# Patient Record
Sex: Female | Born: 1955
Health system: Southern US, Community
[De-identification: ages and names within clinical notes are randomized; demographics above are authoritative.]

## PROBLEM LIST (undated history)

## (undated) DIAGNOSIS — I10 Essential (primary) hypertension: Secondary | ICD-10-CM

## (undated) DIAGNOSIS — K648 Other hemorrhoids: Secondary | ICD-10-CM

## (undated) DIAGNOSIS — H409 Unspecified glaucoma: Secondary | ICD-10-CM

## (undated) DIAGNOSIS — E0590 Thyrotoxicosis, unspecified without thyrotoxic crisis or storm: Secondary | ICD-10-CM

## (undated) DIAGNOSIS — T4145XA Adverse effect of unspecified anesthetic, initial encounter: Secondary | ICD-10-CM

## (undated) DIAGNOSIS — E7800 Pure hypercholesterolemia, unspecified: Secondary | ICD-10-CM

## (undated) DIAGNOSIS — E039 Hypothyroidism, unspecified: Secondary | ICD-10-CM

## (undated) DIAGNOSIS — E11329 Type 2 diabetes mellitus with mild nonproliferative diabetic retinopathy without macular edema: Secondary | ICD-10-CM

## (undated) DIAGNOSIS — IMO0002 Reserved for concepts with insufficient information to code with codable children: Secondary | ICD-10-CM

## (undated) DIAGNOSIS — D72819 Decreased white blood cell count, unspecified: Secondary | ICD-10-CM

## (undated) DIAGNOSIS — K5909 Other constipation: Secondary | ICD-10-CM

## (undated) DIAGNOSIS — E109 Type 1 diabetes mellitus without complications: Secondary | ICD-10-CM

## (undated) DIAGNOSIS — T8859XA Other complications of anesthesia, initial encounter: Secondary | ICD-10-CM

## (undated) DIAGNOSIS — I8390 Asymptomatic varicose veins of unspecified lower extremity: Secondary | ICD-10-CM

## (undated) HISTORY — DX: Type 1 diabetes mellitus without complications: E10.9

## (undated) HISTORY — DX: Hypothyroidism, unspecified: E03.9

## (undated) HISTORY — PX: TUBAL LIGATION: SHX77

## (undated) HISTORY — DX: Unspecified glaucoma: H40.9

## (undated) HISTORY — DX: Thyrotoxicosis, unspecified without thyrotoxic crisis or storm: E05.90

## (undated) HISTORY — DX: Other constipation: K59.09

## (undated) HISTORY — DX: Pure hypercholesterolemia, unspecified: E78.00

## (undated) HISTORY — DX: Decreased white blood cell count, unspecified: D72.819

## (undated) HISTORY — DX: Essential (primary) hypertension: I10

## (undated) HISTORY — DX: Asymptomatic varicose veins of unspecified lower extremity: I83.90

## (undated) HISTORY — DX: Type 2 diabetes mellitus with mild nonproliferative diabetic retinopathy without macular edema: E11.329

## (undated) HISTORY — DX: Other hemorrhoids: K64.8

## (undated) HISTORY — PX: EYE SURGERY: SHX253

## (undated) HISTORY — DX: Reserved for concepts with insufficient information to code with codable children: IMO0002

---
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
## 1997-10-10 ENCOUNTER — Other Ambulatory Visit: Admission: RE | Admit: 1997-10-10 | Discharge: 1997-10-10 | Payer: Self-pay | Admitting: Internal Medicine

## 1998-03-27 ENCOUNTER — Ambulatory Visit (HOSPITAL_COMMUNITY): Admission: RE | Admit: 1998-03-27 | Discharge: 1998-03-27 | Payer: Self-pay | Admitting: Obstetrics & Gynecology

## 1999-03-30 ENCOUNTER — Ambulatory Visit (HOSPITAL_COMMUNITY): Admission: RE | Admit: 1999-03-30 | Discharge: 1999-03-30 | Payer: Self-pay | Admitting: Obstetrics & Gynecology

## 1999-06-01 ENCOUNTER — Encounter: Payer: Self-pay | Admitting: Gastroenterology

## 2000-02-01 ENCOUNTER — Other Ambulatory Visit: Admission: RE | Admit: 2000-02-01 | Discharge: 2000-02-01 | Payer: Self-pay | Admitting: Obstetrics & Gynecology

## 2000-04-04 ENCOUNTER — Ambulatory Visit (HOSPITAL_COMMUNITY): Admission: RE | Admit: 2000-04-04 | Discharge: 2000-04-04 | Payer: Self-pay | Admitting: Obstetrics & Gynecology

## 2001-01-27 ENCOUNTER — Other Ambulatory Visit: Admission: RE | Admit: 2001-01-27 | Discharge: 2001-01-27 | Payer: Self-pay | Admitting: Obstetrics and Gynecology

## 2001-04-07 ENCOUNTER — Ambulatory Visit (HOSPITAL_COMMUNITY): Admission: RE | Admit: 2001-04-07 | Discharge: 2001-04-07 | Payer: Self-pay | Admitting: Obstetrics and Gynecology

## 2001-04-07 ENCOUNTER — Encounter: Payer: Self-pay | Admitting: Obstetrics and Gynecology

## 2001-10-27 ENCOUNTER — Encounter: Payer: Self-pay | Admitting: Specialist

## 2001-10-30 ENCOUNTER — Ambulatory Visit (HOSPITAL_COMMUNITY): Admission: RE | Admit: 2001-10-30 | Discharge: 2001-10-30 | Payer: Self-pay | Admitting: Specialist

## 2002-03-01 ENCOUNTER — Emergency Department (HOSPITAL_COMMUNITY): Admission: EM | Admit: 2002-03-01 | Discharge: 2002-03-01 | Payer: Self-pay | Admitting: Emergency Medicine

## 2002-03-06 ENCOUNTER — Other Ambulatory Visit: Admission: RE | Admit: 2002-03-06 | Discharge: 2002-03-06 | Payer: Self-pay | Admitting: Obstetrics and Gynecology

## 2002-03-29 ENCOUNTER — Encounter: Payer: Self-pay | Admitting: Obstetrics and Gynecology

## 2002-03-29 ENCOUNTER — Ambulatory Visit (HOSPITAL_COMMUNITY): Admission: RE | Admit: 2002-03-29 | Discharge: 2002-03-29 | Payer: Self-pay | Admitting: Obstetrics and Gynecology

## 2002-04-09 ENCOUNTER — Encounter: Payer: Self-pay | Admitting: Obstetrics and Gynecology

## 2002-04-09 ENCOUNTER — Ambulatory Visit (HOSPITAL_COMMUNITY): Admission: RE | Admit: 2002-04-09 | Discharge: 2002-04-09 | Payer: Self-pay | Admitting: Obstetrics and Gynecology

## 2002-06-15 ENCOUNTER — Encounter: Payer: Self-pay | Admitting: Obstetrics and Gynecology

## 2002-06-15 ENCOUNTER — Ambulatory Visit (HOSPITAL_COMMUNITY): Admission: RE | Admit: 2002-06-15 | Discharge: 2002-06-15 | Payer: Self-pay | Admitting: Obstetrics and Gynecology

## 2003-03-27 ENCOUNTER — Other Ambulatory Visit: Admission: RE | Admit: 2003-03-27 | Discharge: 2003-03-27 | Payer: Self-pay | Admitting: Obstetrics and Gynecology

## 2003-04-10 ENCOUNTER — Encounter: Payer: Self-pay | Admitting: Obstetrics and Gynecology

## 2003-04-10 ENCOUNTER — Ambulatory Visit (HOSPITAL_COMMUNITY): Admission: RE | Admit: 2003-04-10 | Discharge: 2003-04-10 | Payer: Self-pay | Admitting: Obstetrics and Gynecology

## 2004-04-10 ENCOUNTER — Ambulatory Visit (HOSPITAL_COMMUNITY): Admission: RE | Admit: 2004-04-10 | Discharge: 2004-04-10 | Payer: Self-pay | Admitting: Obstetrics and Gynecology

## 2004-05-07 ENCOUNTER — Ambulatory Visit: Payer: Self-pay | Admitting: Endocrinology

## 2004-05-18 ENCOUNTER — Ambulatory Visit: Payer: Self-pay

## 2004-05-18 HISTORY — PX: OTHER SURGICAL HISTORY: SHX169

## 2004-05-28 ENCOUNTER — Other Ambulatory Visit: Admission: RE | Admit: 2004-05-28 | Discharge: 2004-05-28 | Payer: Self-pay | Admitting: Obstetrics and Gynecology

## 2004-06-03 ENCOUNTER — Encounter: Admission: RE | Admit: 2004-06-03 | Discharge: 2004-09-01 | Payer: Self-pay | Admitting: Internal Medicine

## 2004-10-12 ENCOUNTER — Encounter: Admission: RE | Admit: 2004-10-12 | Discharge: 2005-01-10 | Payer: Self-pay | Admitting: Internal Medicine

## 2004-11-10 ENCOUNTER — Ambulatory Visit: Payer: Self-pay | Admitting: Endocrinology

## 2004-11-20 ENCOUNTER — Ambulatory Visit: Payer: Self-pay | Admitting: Endocrinology

## 2004-11-26 ENCOUNTER — Ambulatory Visit: Payer: Self-pay | Admitting: Endocrinology

## 2004-12-02 ENCOUNTER — Ambulatory Visit: Payer: Self-pay | Admitting: Endocrinology

## 2004-12-16 ENCOUNTER — Ambulatory Visit: Payer: Self-pay | Admitting: Endocrinology

## 2005-03-03 ENCOUNTER — Ambulatory Visit: Payer: Self-pay | Admitting: Endocrinology

## 2005-03-10 ENCOUNTER — Ambulatory Visit: Payer: Self-pay | Admitting: Endocrinology

## 2005-04-15 ENCOUNTER — Ambulatory Visit (HOSPITAL_COMMUNITY): Admission: RE | Admit: 2005-04-15 | Discharge: 2005-04-15 | Payer: Self-pay | Admitting: Obstetrics and Gynecology

## 2005-04-29 ENCOUNTER — Encounter: Admission: RE | Admit: 2005-04-29 | Discharge: 2005-04-29 | Payer: Self-pay | Admitting: Obstetrics and Gynecology

## 2005-05-07 ENCOUNTER — Ambulatory Visit: Payer: Self-pay | Admitting: Endocrinology

## 2005-06-10 ENCOUNTER — Other Ambulatory Visit: Admission: RE | Admit: 2005-06-10 | Discharge: 2005-06-10 | Payer: Self-pay | Admitting: Obstetrics and Gynecology

## 2005-07-09 ENCOUNTER — Ambulatory Visit: Payer: Self-pay | Admitting: Endocrinology

## 2005-07-15 ENCOUNTER — Ambulatory Visit: Payer: Self-pay | Admitting: Endocrinology

## 2005-07-22 ENCOUNTER — Ambulatory Visit (HOSPITAL_COMMUNITY): Admission: RE | Admit: 2005-07-22 | Discharge: 2005-07-22 | Payer: Self-pay | Admitting: Endocrinology

## 2005-09-14 ENCOUNTER — Ambulatory Visit: Payer: Self-pay | Admitting: Endocrinology

## 2006-03-30 ENCOUNTER — Encounter: Admission: RE | Admit: 2006-03-30 | Discharge: 2006-03-30 | Payer: Self-pay | Admitting: Obstetrics and Gynecology

## 2006-03-30 IMAGING — MG MM DIAGNOSTIC BILATERAL
4 series · 4 of 4 positions shown · non-contrast
Comparison: none

DG DIAGNOSTIC BILATERAL
Bilateral CC and MLO view(s) were taken.

LEFT BREAST ULTRASOUND
DIGITAL DIAGNOSTIC BILATERAL MAMMOGRAM WITH CAD AND LEFT BREAST ULTRASOUND:
CLINICAL:  50-year old female with focal left breast pain.
Scattered fibroglandular densities are again noted.  There is no evidence of mass, distortion, or 
malignant type calcifications.
Physical examination of the left breast demonstrates no palpable abnormalities.
Ultrasound of the left breast demonstrates dense fibroglandular tissue without evidence of solid or
cystic masses, distortion, or abnormal areas of shadowing.

[R CC]
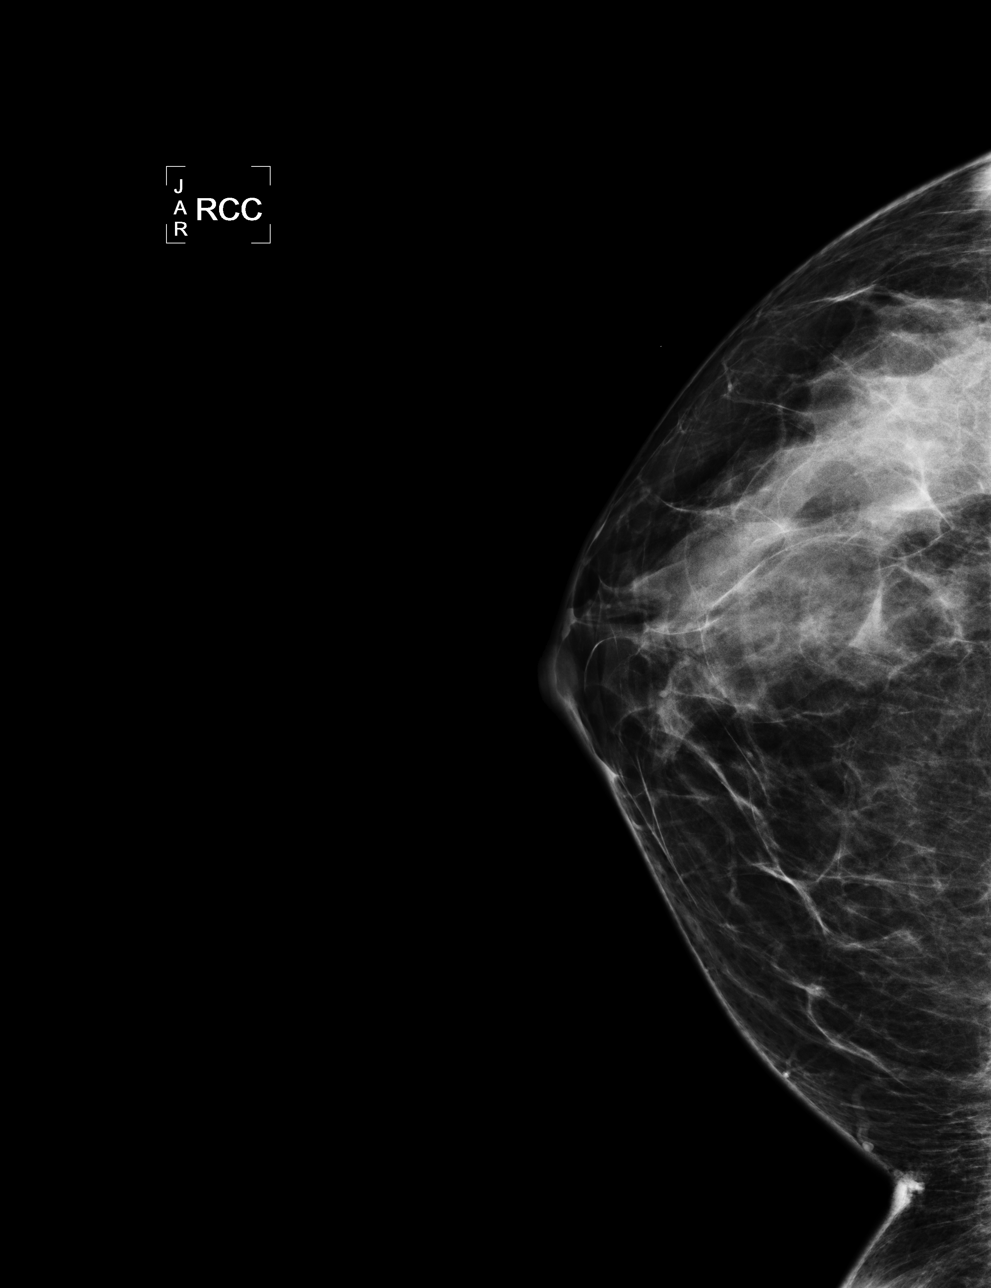

[L CC]
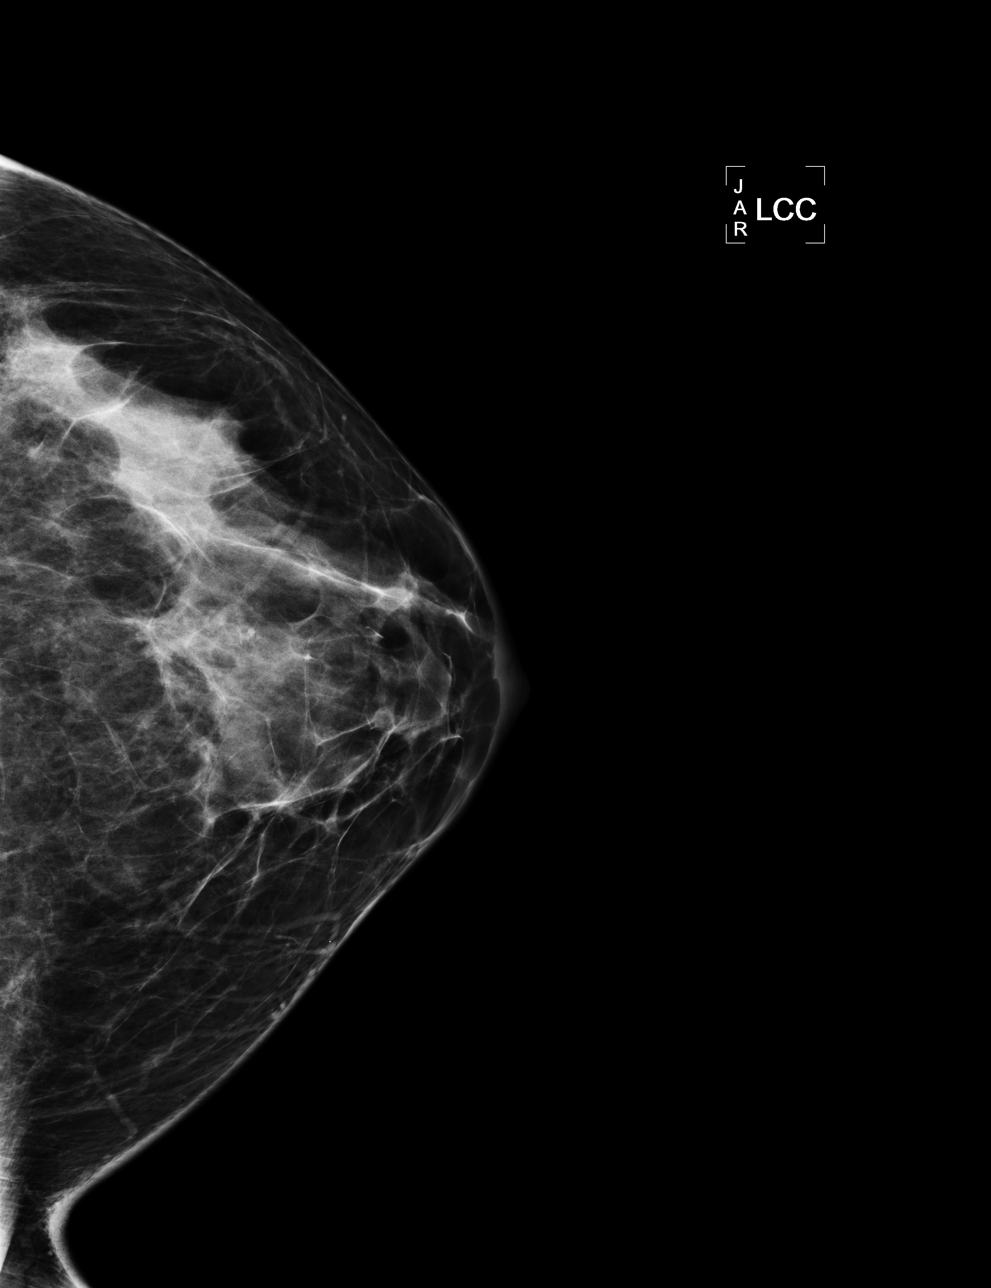

[L MLO]
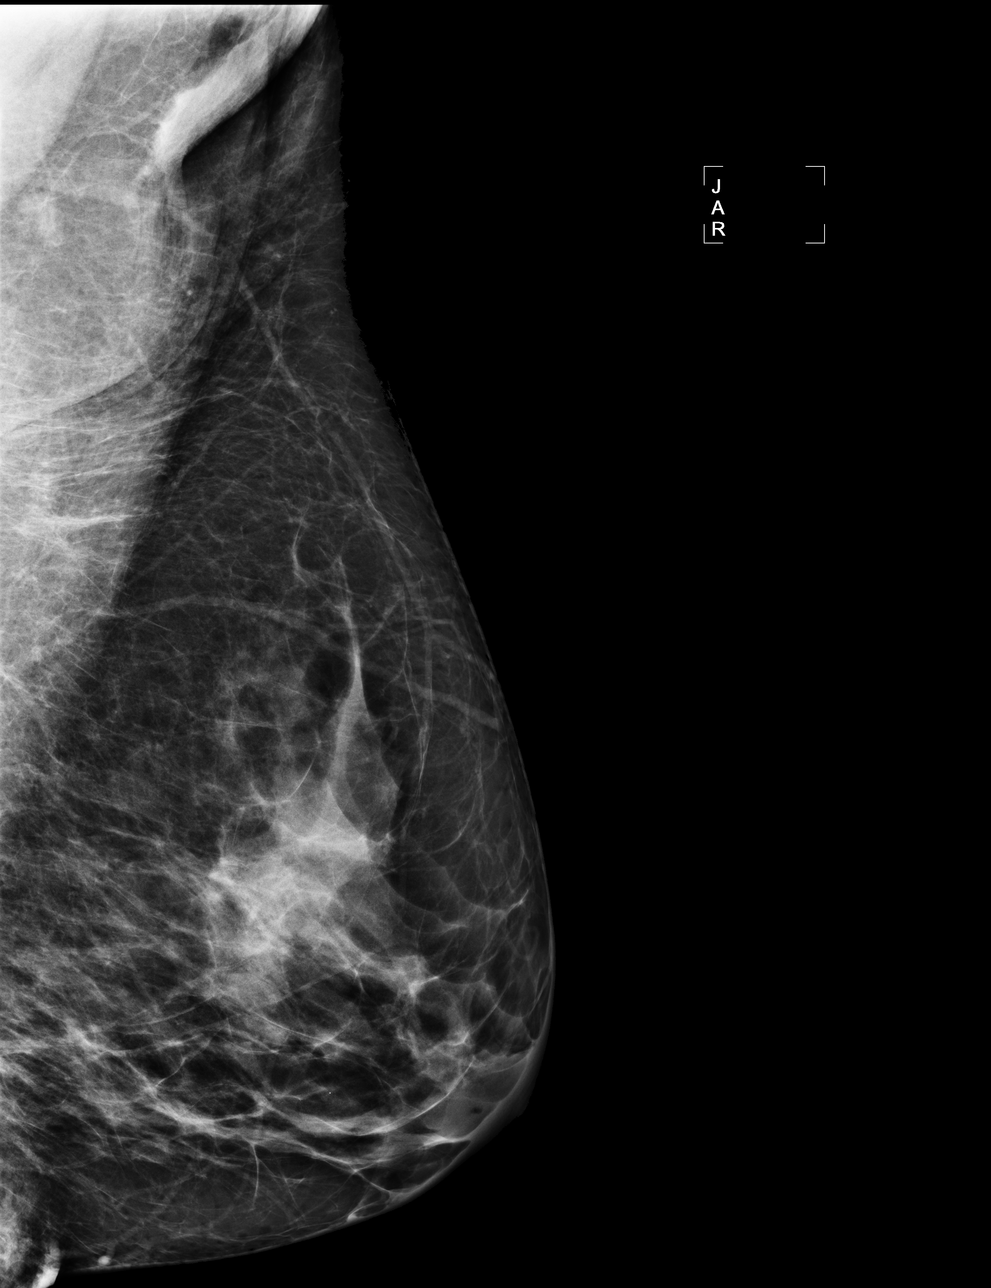

[R MLO]
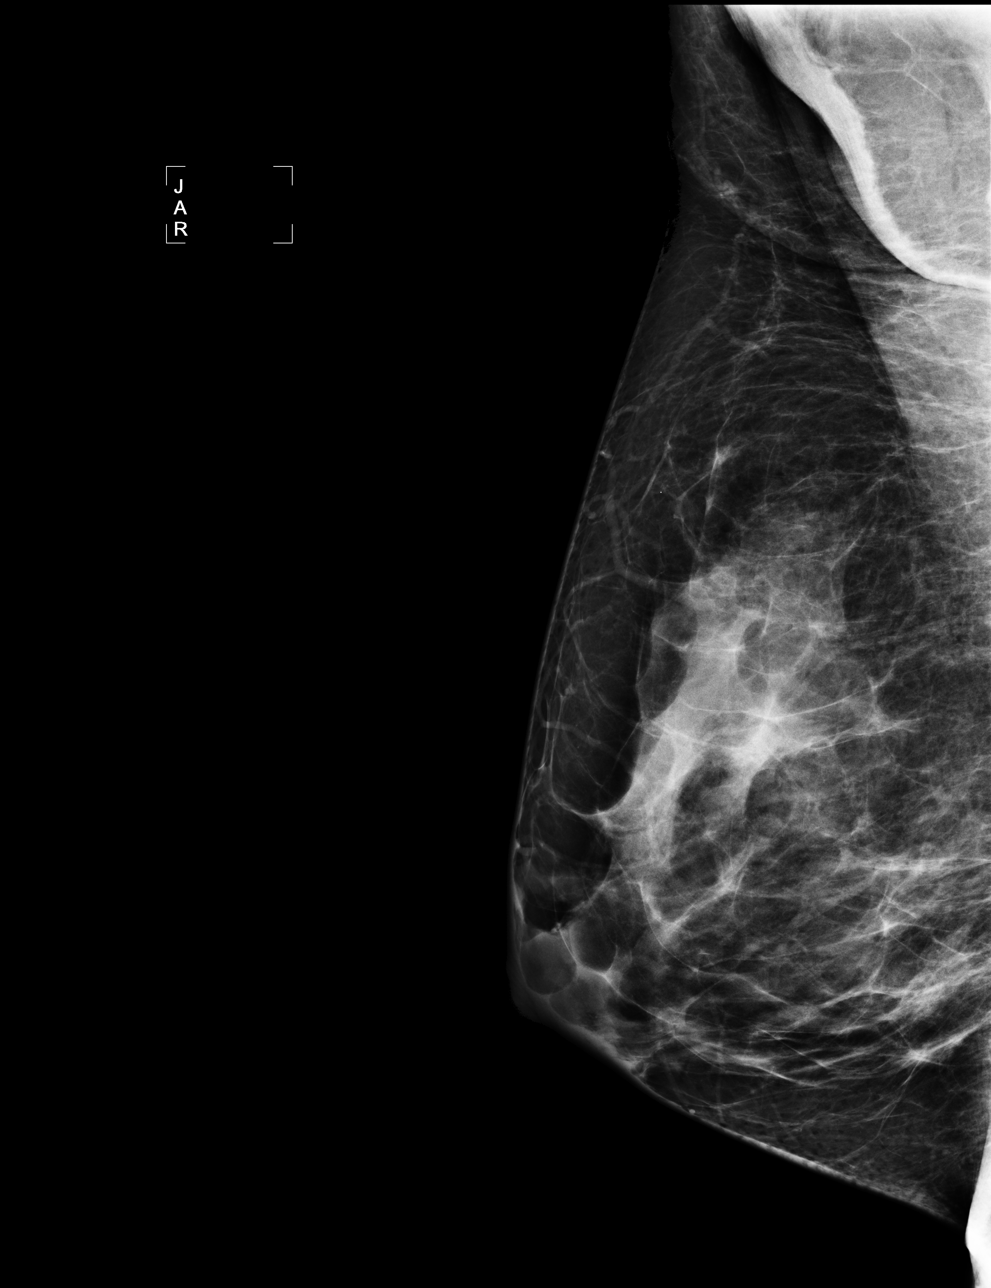

[4 of 4 positions shown; findings below may reference images not displayed]

IMPRESSION: No evidence of malignancy or focal left breast abnormality.  Recommend bilateral screening 
mammogram in one year.

ASSESSMENT: Negative - BI-RADS 1

Screening mammogram.
,

## 2006-07-14 ENCOUNTER — Ambulatory Visit: Payer: Self-pay | Admitting: Endocrinology

## 2006-08-22 ENCOUNTER — Ambulatory Visit: Payer: Self-pay | Admitting: Endocrinology

## 2006-08-22 LAB — CONVERTED CEMR LAB
ALT: 16 units/L (ref 0–40)
AST: 22 units/L (ref 0–37)
Albumin: 3.6 g/dL (ref 3.5–5.2)
Alkaline Phosphatase: 87 units/L (ref 39–117)
BUN: 18 mg/dL (ref 6–23)
Basophils Absolute: 0 10*3/uL (ref 0.0–0.1)
Basophils Relative: 0.4 % (ref 0.0–1.0)
Bilirubin Urine: NEGATIVE
Bilirubin, Direct: 0.1 mg/dL (ref 0.0–0.3)
CO2: 25 meq/L (ref 19–32)
Calcium: 8.8 mg/dL (ref 8.4–10.5)
Chloride: 101 meq/L (ref 96–112)
Cholesterol: 244 mg/dL (ref 0–200)
Creatinine, Ser: 1 mg/dL (ref 0.4–1.2)
Creatinine,U: 112.9 mg/dL
Crystals: NEGATIVE
Direct LDL: 149.3 mg/dL
Eosinophils Absolute: 0.1 10*3/uL (ref 0.0–0.6)
Eosinophils Relative: 2.1 % (ref 0.0–5.0)
GFR calc Af Amer: 76 mL/min
GFR calc non Af Amer: 63 mL/min
Glucose, Bld: 296 mg/dL — ABNORMAL HIGH (ref 70–99)
HCT: 37 % (ref 36.0–46.0)
HDL: 67.4 mg/dL (ref >39.0)
Hemoglobin: 12.3 g/dL (ref 12.0–15.0)
Hgb A1c MFr Bld: 11.7 % — ABNORMAL HIGH (ref 4.6–6.0)
Ketones, ur: 15 mg/dL — AB
Leukocytes, UA: NEGATIVE
Lymphocytes Relative: 30.1 % (ref 12.0–46.0)
MCHC: 33.3 g/dL (ref 30.0–36.0)
MCV: 83.5 fL (ref 78.0–100.0)
Microalb Creat Ratio: 40.7 mg/g — ABNORMAL HIGH (ref 0.0–30.0)
Microalb, Ur: 4.6 mg/dL — ABNORMAL HIGH (ref 0.0–1.9)
Monocytes Absolute: 0.2 10*3/uL (ref 0.2–0.7)
Monocytes Relative: 5.9 % (ref 3.0–11.0)
Mucus, UA: NEGATIVE
Neutro Abs: 2.6 10*3/uL (ref 1.4–7.7)
Neutrophils Relative %: 61.5 % (ref 43.0–77.0)
Nitrite: POSITIVE — AB
Platelets: 276 10*3/uL (ref 150–400)
Potassium: 4.4 meq/L (ref 3.5–5.1)
RBC: 4.43 M/uL (ref 3.87–5.11)
RDW: 12.9 % (ref 11.5–14.6)
Sodium: 137 meq/L (ref 135–145)
Specific Gravity, Urine: 1.025 (ref 1.000–1.03)
TSH: 0.23 microintl units/mL — ABNORMAL LOW (ref 0.35–5.50)
Total Bilirubin: 0.8 mg/dL (ref 0.3–1.2)
Total CHOL/HDL Ratio: 3.6
Total Protein, Urine: NEGATIVE mg/dL
Total Protein: 6.5 g/dL (ref 6.0–8.3)
Triglycerides: 81 mg/dL (ref 0–149)
Urine Glucose: >=1000 mg/dL — CR
Urobilinogen, UA: 0.2 (ref 0.0–1.0)
VLDL: 16 mg/dL (ref 0–40)
WBC: 4.1 10*3/uL — ABNORMAL LOW (ref 4.5–10.5)
pH: 6 (ref 5.0–8.0)

## 2006-08-24 ENCOUNTER — Ambulatory Visit: Payer: Self-pay | Admitting: Endocrinology

## 2006-08-24 HISTORY — PX: ELECTROCARDIOGRAM: SHX264

## 2006-09-02 ENCOUNTER — Ambulatory Visit: Payer: Self-pay | Admitting: Endocrinology

## 2006-09-02 LAB — CONVERTED CEMR LAB
Bilirubin Urine: NEGATIVE
Crystals: NEGATIVE
Mucus, UA: NEGATIVE
Nitrite: POSITIVE — AB
Specific Gravity, Urine: 1.015 (ref 1.000–1.03)
Total Protein, Urine: NEGATIVE mg/dL
Urine Glucose: >=1000 mg/dL — CR
Urobilinogen, UA: 0.2 (ref 0.0–1.0)
pH: 6 (ref 5.0–8.0)

## 2006-10-05 ENCOUNTER — Ambulatory Visit: Payer: Self-pay | Admitting: Endocrinology

## 2006-11-29 ENCOUNTER — Ambulatory Visit: Payer: Self-pay | Admitting: Endocrinology

## 2007-01-07 ENCOUNTER — Encounter: Payer: Self-pay | Admitting: Endocrinology

## 2007-01-07 DIAGNOSIS — E109 Type 1 diabetes mellitus without complications: Secondary | ICD-10-CM

## 2007-01-07 DIAGNOSIS — I10 Essential (primary) hypertension: Secondary | ICD-10-CM

## 2007-01-07 HISTORY — DX: Type 1 diabetes mellitus without complications: E10.9

## 2007-04-04 ENCOUNTER — Encounter: Admission: RE | Admit: 2007-04-04 | Discharge: 2007-04-04 | Payer: Self-pay | Admitting: Obstetrics and Gynecology

## 2007-06-19 ENCOUNTER — Encounter: Payer: Self-pay | Admitting: Endocrinology

## 2007-12-07 ENCOUNTER — Telehealth: Payer: Self-pay | Admitting: Endocrinology

## 2007-12-07 ENCOUNTER — Ambulatory Visit: Payer: Self-pay | Admitting: Endocrinology

## 2007-12-07 LAB — CONVERTED CEMR LAB
ALT: 27 units/L (ref 0–35)
AST: 29 units/L (ref 0–37)
Alkaline Phosphatase: 67 units/L (ref 39–117)
BUN: 19 mg/dL (ref 6–23)
Basophils Absolute: 0 10*3/uL (ref 0.0–0.1)
Basophils Relative: 0.5 % (ref 0.0–1.0)
Bilirubin Urine: NEGATIVE
Bilirubin, Direct: 0.1 mg/dL (ref 0.0–0.3)
CO2: 29 meq/L (ref 19–32)
Calcium: 9.4 mg/dL (ref 8.4–10.5)
Cholesterol: 400 mg/dL (ref 0–200)
Creatinine, Ser: 1.2 mg/dL (ref 0.4–1.2)
Crystals: NEGATIVE
Eosinophils Absolute: 0.1 10*3/uL (ref 0.0–0.7)
GFR calc Af Amer: 61 mL/min
GFR calc non Af Amer: 51 mL/min
Glucose, Bld: 244 mg/dL — ABNORMAL HIGH (ref 70–99)
HCT: 38.9 % (ref 36.0–46.0)
HDL: 74.6 mg/dL (ref >39.0)
Hemoglobin: 13.1 g/dL (ref 12.0–15.0)
Ketones, ur: NEGATIVE mg/dL
Lymphocytes Relative: 64.2 % — ABNORMAL HIGH (ref 12.0–46.0)
MCHC: 33.7 g/dL (ref 30.0–36.0)
MCV: 87.1 fL (ref 78.0–100.0)
Monocytes Absolute: 0.4 10*3/uL (ref 0.1–1.0)
Monocytes Relative: 11.2 % (ref 3.0–12.0)
Mucus, UA: NEGATIVE
Neutro Abs: 0.7 10*3/uL — ABNORMAL LOW (ref 1.4–7.7)
Neutrophils Relative %: 21.6 % — ABNORMAL LOW (ref 43.0–77.0)
Nitrite: POSITIVE — AB
Platelets: 259 10*3/uL (ref 150–400)
Potassium: 4 meq/L (ref 3.5–5.1)
RBC: 4.47 M/uL (ref 3.87–5.11)
RDW: 12.9 % (ref 11.5–14.6)
Sodium: 134 meq/L — ABNORMAL LOW (ref 135–145)
Specific Gravity, Urine: 1.015 (ref 1.000–1.03)
TSH: 99.41 microintl units/mL — ABNORMAL HIGH (ref 0.35–5.50)
Total Bilirubin: 0.7 mg/dL (ref 0.3–1.2)
Total CHOL/HDL Ratio: 5.4
Total Protein: 7.8 g/dL (ref 6.0–8.3)
Triglycerides: 129 mg/dL (ref 0–149)
Urine Glucose: >=1000 mg/dL — CR
Urobilinogen, UA: 0.2 (ref 0.0–1.0)
VLDL: 26 mg/dL (ref 0–40)
pH: 6 (ref 5.0–8.0)

## 2007-12-08 ENCOUNTER — Encounter: Payer: Self-pay | Admitting: Endocrinology

## 2007-12-13 ENCOUNTER — Ambulatory Visit: Payer: Self-pay | Admitting: Endocrinology

## 2007-12-13 DIAGNOSIS — E11329 Type 2 diabetes mellitus with mild nonproliferative diabetic retinopathy without macular edema: Secondary | ICD-10-CM

## 2007-12-13 DIAGNOSIS — E113299 Type 2 diabetes mellitus with mild nonproliferative diabetic retinopathy without macular edema, unspecified eye: Secondary | ICD-10-CM | POA: Insufficient documentation

## 2007-12-13 DIAGNOSIS — H409 Unspecified glaucoma: Secondary | ICD-10-CM | POA: Insufficient documentation

## 2007-12-13 DIAGNOSIS — E7800 Pure hypercholesterolemia, unspecified: Secondary | ICD-10-CM

## 2007-12-13 DIAGNOSIS — K5909 Other constipation: Secondary | ICD-10-CM

## 2007-12-13 DIAGNOSIS — D72819 Decreased white blood cell count, unspecified: Secondary | ICD-10-CM

## 2007-12-13 DIAGNOSIS — I8390 Asymptomatic varicose veins of unspecified lower extremity: Secondary | ICD-10-CM | POA: Insufficient documentation

## 2007-12-13 DIAGNOSIS — E039 Hypothyroidism, unspecified: Secondary | ICD-10-CM

## 2007-12-13 DIAGNOSIS — N3000 Acute cystitis without hematuria: Secondary | ICD-10-CM

## 2007-12-13 HISTORY — DX: Type 2 diabetes mellitus with mild nonproliferative diabetic retinopathy without macular edema: E11.329

## 2007-12-13 HISTORY — DX: Hypothyroidism, unspecified: E03.9

## 2007-12-13 HISTORY — DX: Unspecified glaucoma: H40.9

## 2007-12-13 HISTORY — DX: Pure hypercholesterolemia, unspecified: E78.00

## 2007-12-13 HISTORY — DX: Decreased white blood cell count, unspecified: D72.819

## 2007-12-13 HISTORY — DX: Other constipation: K59.09

## 2007-12-13 HISTORY — DX: Asymptomatic varicose veins of unspecified lower extremity: I83.90

## 2008-01-02 ENCOUNTER — Encounter: Payer: Self-pay | Admitting: Endocrinology

## 2008-01-26 ENCOUNTER — Ambulatory Visit: Payer: Self-pay | Admitting: Endocrinology

## 2008-01-27 LAB — CONVERTED CEMR LAB
Cholesterol: 221 mg/dL (ref 0–200)
Direct LDL: 142.9 mg/dL
HDL: 52 mg/dL (ref >39.0)
TSH: 0.58 microintl units/mL (ref 0.35–5.50)
Total CHOL/HDL Ratio: 4.3
Triglycerides: 90 mg/dL (ref 0–149)
VLDL: 18 mg/dL (ref 0–40)

## 2008-02-01 ENCOUNTER — Ambulatory Visit: Payer: Self-pay | Admitting: Endocrinology

## 2008-02-01 LAB — CONVERTED CEMR LAB
Bilirubin Urine: NEGATIVE
Ketones, urine, test strip: NEGATIVE
Nitrite: POSITIVE
Protein, U semiquant: NEGATIVE
Urobilinogen, UA: 0.2
pH: 5

## 2008-02-02 ENCOUNTER — Encounter: Payer: Self-pay | Admitting: Endocrinology

## 2008-04-04 ENCOUNTER — Encounter: Admission: RE | Admit: 2008-04-04 | Discharge: 2008-04-04 | Payer: Self-pay | Admitting: Obstetrics and Gynecology

## 2008-04-04 IMAGING — US UNKNOWN PR STUDY
1 series · 4 of 4 positions shown · non-contrast
Comparison: [DATE] [DATE], [DATE], [DATE] [DATE], [DATE]

CLINICAL DATA: Palpable lump right breast

DIGITAL DIAGNOSTIC  BILATERAL  MAMMOGRAM  WITH CAD AND RIGHT BREAST
ULTRASOUND:

[Series 1: unknown pr study · 4 of 4 slices shown]
[im 1/4]
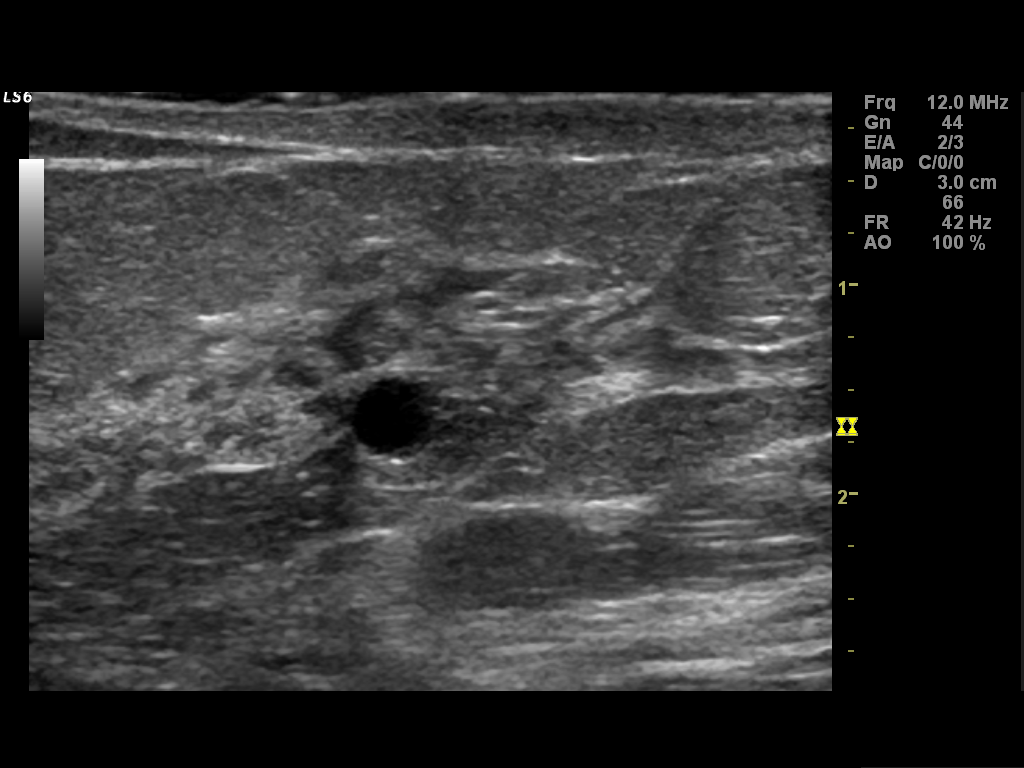
[im 2/4]
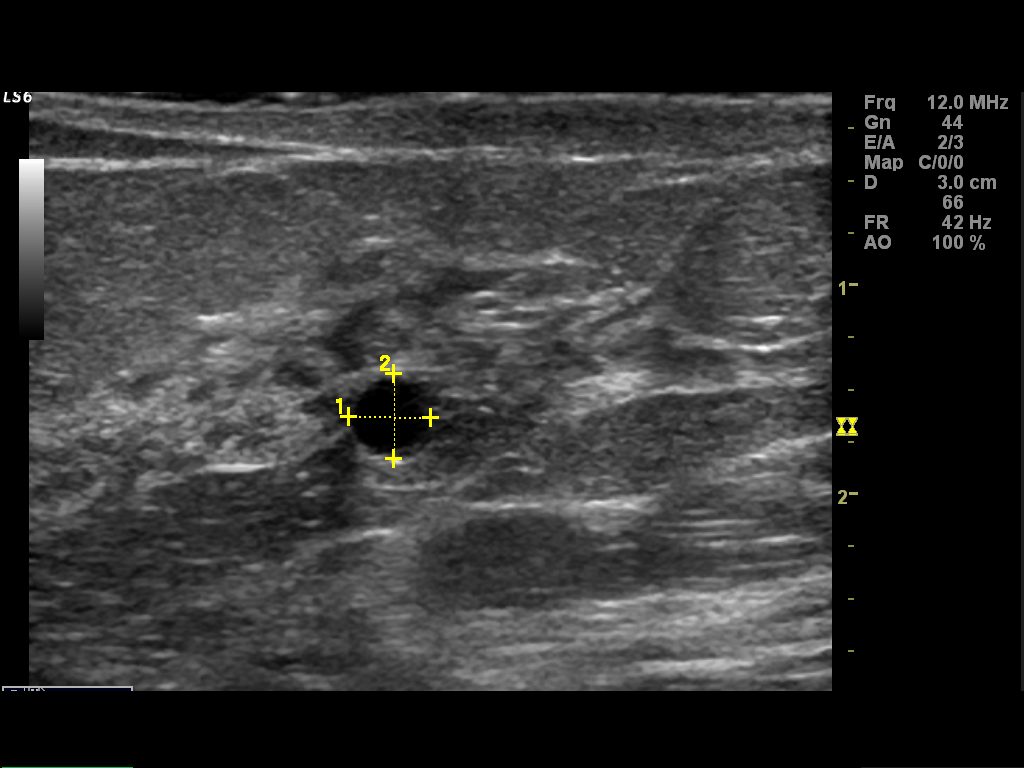
[im 3/4]
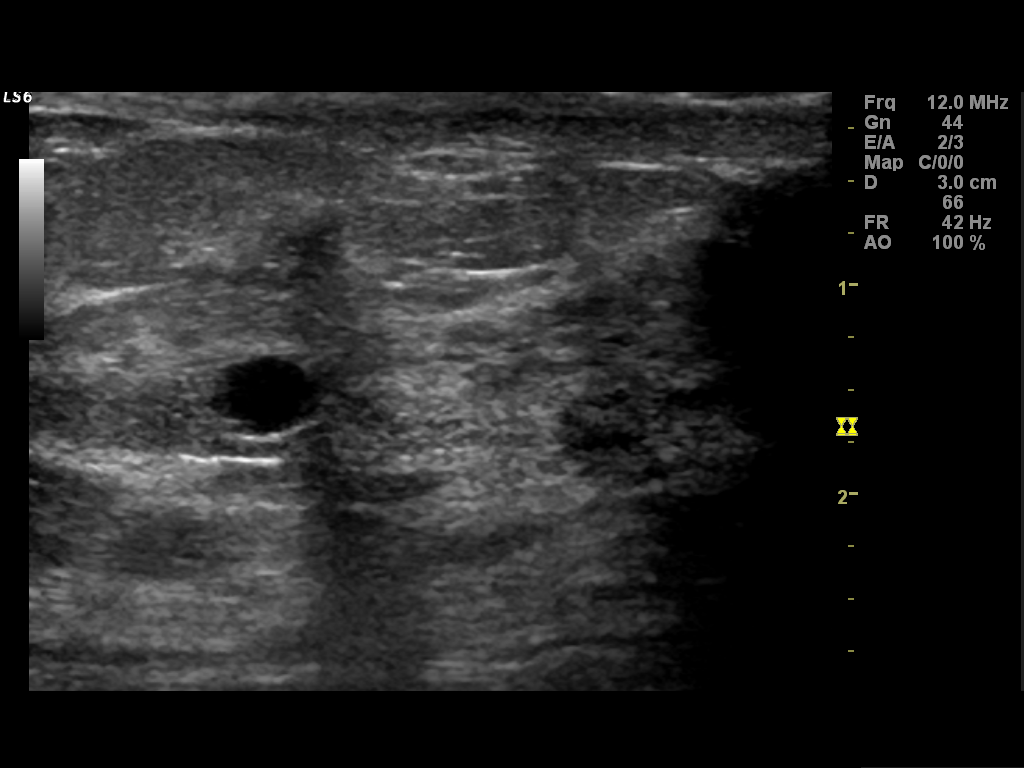
[im 4/4]
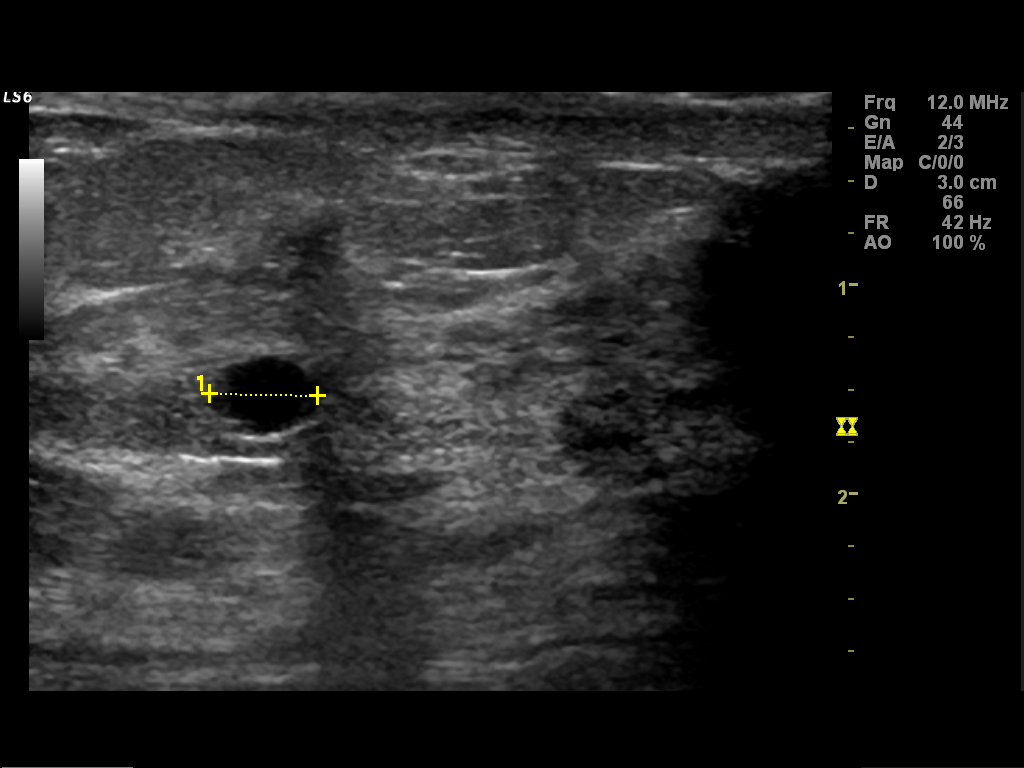

[4 of 4 positions shown; findings below may reference images not displayed]

FINDINGS: CC and MLO views of bilateral breast and spot tangential
view of right breast are submitted reviewed.  Heterogeneously dense
breast parenchyma is identified lowering the sensitivity of
mammography.  No suspicious abnormality is identified bilateral
breasts.

Ultrasound is performed, showing a 0.39 x 0.41 x 0.52 cm simple
cyst at the right breast 12 o'clock subareolar palpable region.
IMPRESSION: Benign findings, recommend routine screening mammogram in 1 year

BI-RADS CATEGORY 2:  Benign finding(s).

## 2008-04-17 ENCOUNTER — Ambulatory Visit: Payer: Self-pay | Admitting: Endocrinology

## 2008-04-18 ENCOUNTER — Encounter: Payer: Self-pay | Admitting: Endocrinology

## 2008-04-18 ENCOUNTER — Telehealth (INDEPENDENT_AMBULATORY_CARE_PROVIDER_SITE_OTHER): Payer: Self-pay | Admitting: *Deleted

## 2008-06-24 ENCOUNTER — Ambulatory Visit: Payer: Self-pay | Admitting: Endocrinology

## 2008-07-23 ENCOUNTER — Ambulatory Visit: Payer: Self-pay | Admitting: Gastroenterology

## 2008-07-23 DIAGNOSIS — K648 Other hemorrhoids: Secondary | ICD-10-CM | POA: Insufficient documentation

## 2008-07-23 DIAGNOSIS — K625 Hemorrhage of anus and rectum: Secondary | ICD-10-CM | POA: Insufficient documentation

## 2008-07-23 DIAGNOSIS — K5900 Constipation, unspecified: Secondary | ICD-10-CM

## 2008-07-23 HISTORY — DX: Other hemorrhoids: K64.8

## 2008-07-23 LAB — CONVERTED CEMR LAB
Basophils Absolute: 0 10*3/uL (ref 0.0–0.1)
Basophils Relative: 0.5 % (ref 0.0–3.0)
Eosinophils Absolute: 0.1 10*3/uL (ref 0.0–0.7)
Eosinophils Relative: 1.3 % (ref 0.0–5.0)
HCT: 38.1 % (ref 36.0–46.0)
Hemoglobin: 12.9 g/dL (ref 12.0–15.0)
Lymphocytes Relative: 24.8 % (ref 12.0–46.0)
MCHC: 33.9 g/dL (ref 30.0–36.0)
MCV: 83.8 fL (ref 78.0–100.0)
Magnesium: 2.2 mg/dL (ref 1.5–2.5)
Monocytes Absolute: 0.5 10*3/uL (ref 0.1–1.0)
Monocytes Relative: 8.7 % (ref 3.0–12.0)
Neutro Abs: 3.5 10*3/uL (ref 1.4–7.7)
Neutrophils Relative %: 64.7 % (ref 43.0–77.0)
Platelets: 293 10*3/uL (ref 150–400)
RBC: 4.55 M/uL (ref 3.87–5.11)
RDW: 13.3 % (ref 11.5–14.6)
WBC: 5.4 10*3/uL (ref 4.5–10.5)

## 2008-09-02 ENCOUNTER — Encounter: Payer: Self-pay | Admitting: Gastroenterology

## 2008-09-02 ENCOUNTER — Ambulatory Visit: Payer: Self-pay | Admitting: Gastroenterology

## 2008-09-05 ENCOUNTER — Encounter: Payer: Self-pay | Admitting: Gastroenterology

## 2008-09-16 ENCOUNTER — Encounter: Payer: Self-pay | Admitting: Endocrinology

## 2008-12-20 ENCOUNTER — Telehealth (INDEPENDENT_AMBULATORY_CARE_PROVIDER_SITE_OTHER): Payer: Self-pay | Admitting: *Deleted

## 2009-01-10 ENCOUNTER — Telehealth: Payer: Self-pay | Admitting: Endocrinology

## 2009-01-16 ENCOUNTER — Ambulatory Visit: Payer: Self-pay | Admitting: Endocrinology

## 2009-01-17 ENCOUNTER — Telehealth (INDEPENDENT_AMBULATORY_CARE_PROVIDER_SITE_OTHER): Payer: Self-pay | Admitting: *Deleted

## 2009-01-17 LAB — CONVERTED CEMR LAB
ALT: 15 units/L (ref 0–35)
AST: 20 units/L (ref 0–37)
Albumin: 3.7 g/dL (ref 3.5–5.2)
Alkaline Phosphatase: 75 units/L (ref 39–117)
BUN: 19 mg/dL (ref 6–23)
Basophils Absolute: 0.1 10*3/uL (ref 0.0–0.1)
Basophils Relative: 4.1 % — ABNORMAL HIGH (ref 0.0–3.0)
Bilirubin Urine: NEGATIVE
Bilirubin, Direct: 0.1 mg/dL (ref 0.0–0.3)
CO2: 28 meq/L (ref 19–32)
Calcium: 9.1 mg/dL (ref 8.4–10.5)
Chloride: 100 meq/L (ref 96–112)
Cholesterol: 242 mg/dL — ABNORMAL HIGH (ref 0–200)
Creatinine, Ser: 0.9 mg/dL (ref 0.4–1.2)
Direct LDL: 151 mg/dL
Eosinophils Absolute: 0.1 10*3/uL (ref 0.0–0.7)
Eosinophils Relative: 1.7 % (ref 0.0–5.0)
GFR calc non Af Amer: 84.75 mL/min (ref >60)
Glucose, Bld: 222 mg/dL — ABNORMAL HIGH (ref 70–99)
HCT: 38.2 % (ref 36.0–46.0)
HDL: 71.5 mg/dL (ref >39.00)
Hemoglobin: 13.1 g/dL (ref 12.0–15.0)
Hgb A1c MFr Bld: 11.7 % — ABNORMAL HIGH (ref 4.6–6.5)
Lymphocytes Relative: 28 % (ref 12.0–46.0)
Lymphs Abs: 1 10*3/uL (ref 0.7–4.0)
MCHC: 34.2 g/dL (ref 30.0–36.0)
MCV: 84.6 fL (ref 78.0–100.0)
Monocytes Absolute: 0.3 10*3/uL (ref 0.1–1.0)
Monocytes Relative: 7.9 % (ref 3.0–12.0)
Neutro Abs: 1.9 10*3/uL (ref 1.4–7.7)
Neutrophils Relative %: 58.3 % (ref 43.0–77.0)
Nitrite: NEGATIVE
Platelets: 284 10*3/uL (ref 150.0–400.0)
Potassium: 4.5 meq/L (ref 3.5–5.1)
RBC: 4.52 M/uL (ref 3.87–5.11)
RDW: 12.5 % (ref 11.5–14.6)
Sodium: 135 meq/L (ref 135–145)
Specific Gravity, Urine: 1.025 (ref 1.000–1.030)
TSH: 0.87 microintl units/mL (ref 0.35–5.50)
Total Bilirubin: 0.8 mg/dL (ref 0.3–1.2)
Total CHOL/HDL Ratio: 3
Total Protein: 7.3 g/dL (ref 6.0–8.3)
Triglycerides: 86 mg/dL (ref 0.0–149.0)
Urine Glucose: 100 mg/dL
Urobilinogen, UA: 0.2 (ref 0.0–1.0)
VLDL: 17.2 mg/dL (ref 0.0–40.0)
WBC: 3.4 10*3/uL — ABNORMAL LOW (ref 4.5–10.5)
pH: 5.5 (ref 5.0–8.0)

## 2009-01-23 ENCOUNTER — Ambulatory Visit: Payer: Self-pay | Admitting: Endocrinology

## 2009-01-27 ENCOUNTER — Telehealth: Payer: Self-pay | Admitting: Endocrinology

## 2009-03-21 ENCOUNTER — Telehealth: Payer: Self-pay | Admitting: Endocrinology

## 2009-03-27 ENCOUNTER — Telehealth: Payer: Self-pay | Admitting: Endocrinology

## 2009-04-07 ENCOUNTER — Encounter: Admission: RE | Admit: 2009-04-07 | Discharge: 2009-04-07 | Payer: Self-pay | Admitting: Obstetrics and Gynecology

## 2009-04-07 IMAGING — MG MM SCREEN MAMMOGRAM BILATERAL
5 series · 5 of 5 positions shown · non-contrast
Comparison: none

DG SCREEN MAMMOGRAM BILATERAL
Bilateral CC and MLO view(s) were taken.
Technologist: BRUGGEMAN

DIGITAL SCREENING MAMMOGRAM WITH CAD:
The breast tissue is heterogeneously dense.  No masses or malignant type calcifications are 
identified.  Compared with prior studies.
Images were processed with CAD.

[R CC (1 of 2)]
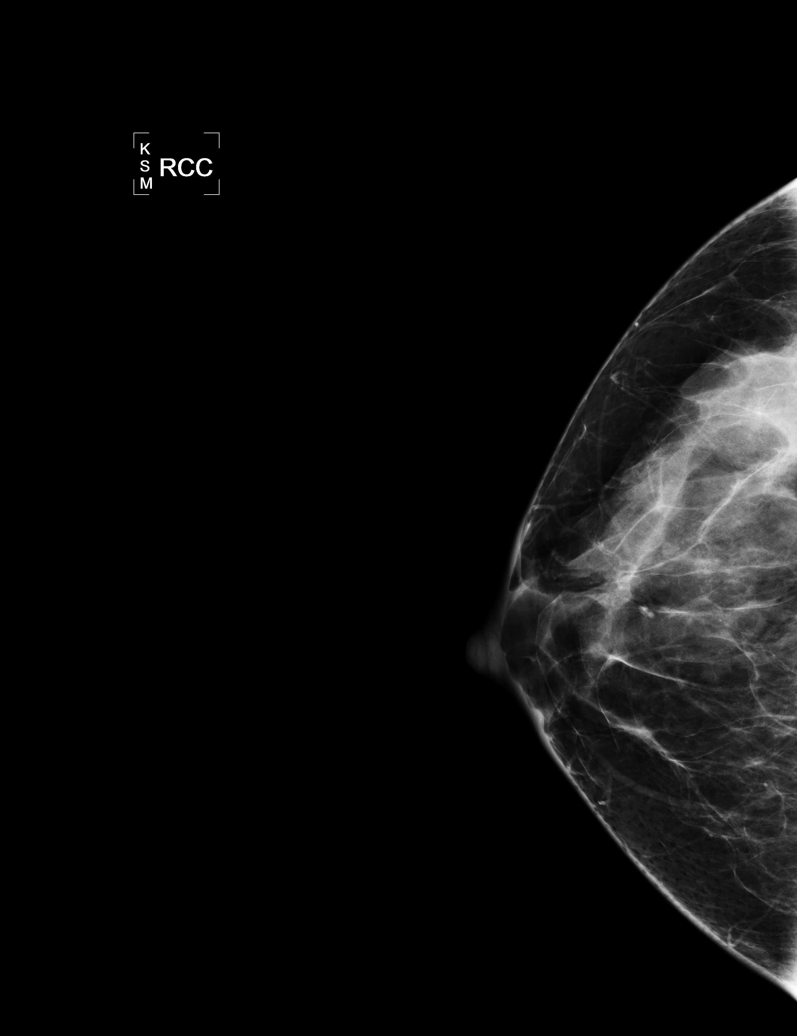

[L CC]
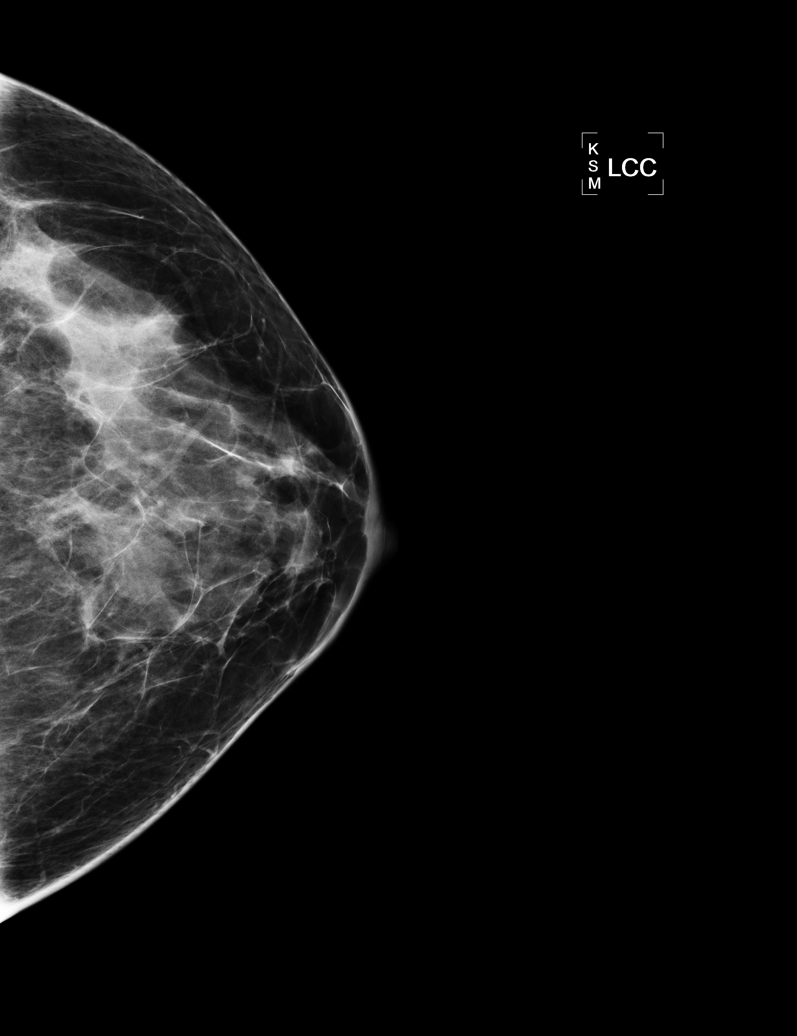

[L MLO]
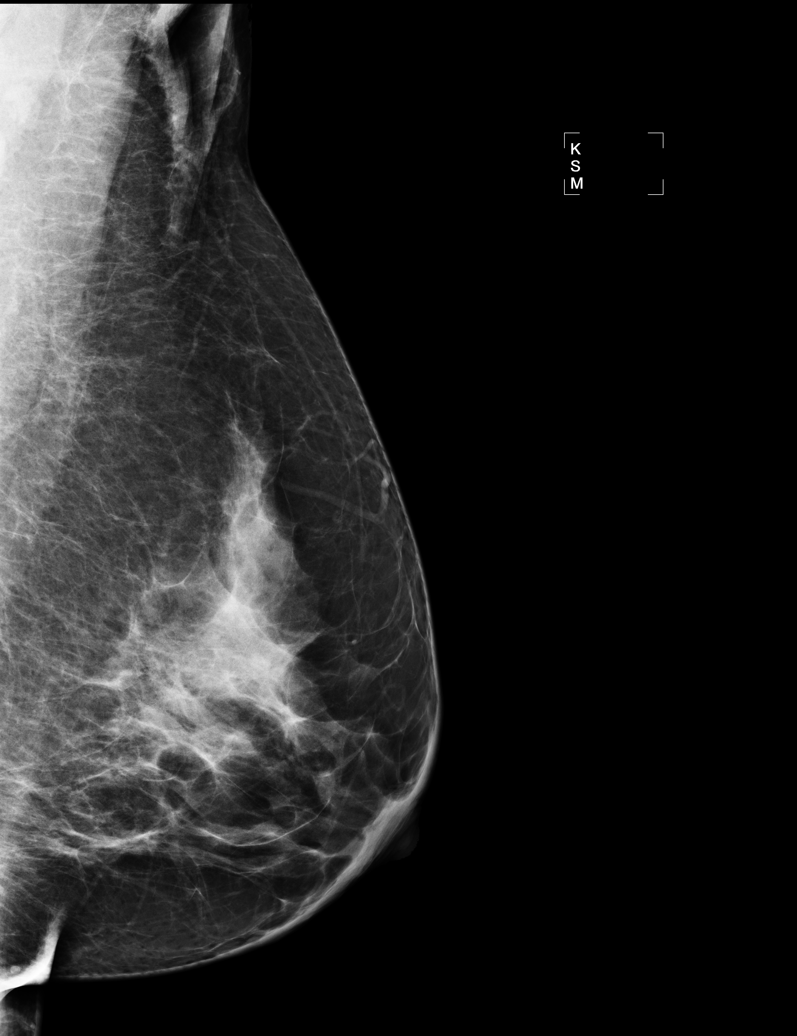

[R MLO]
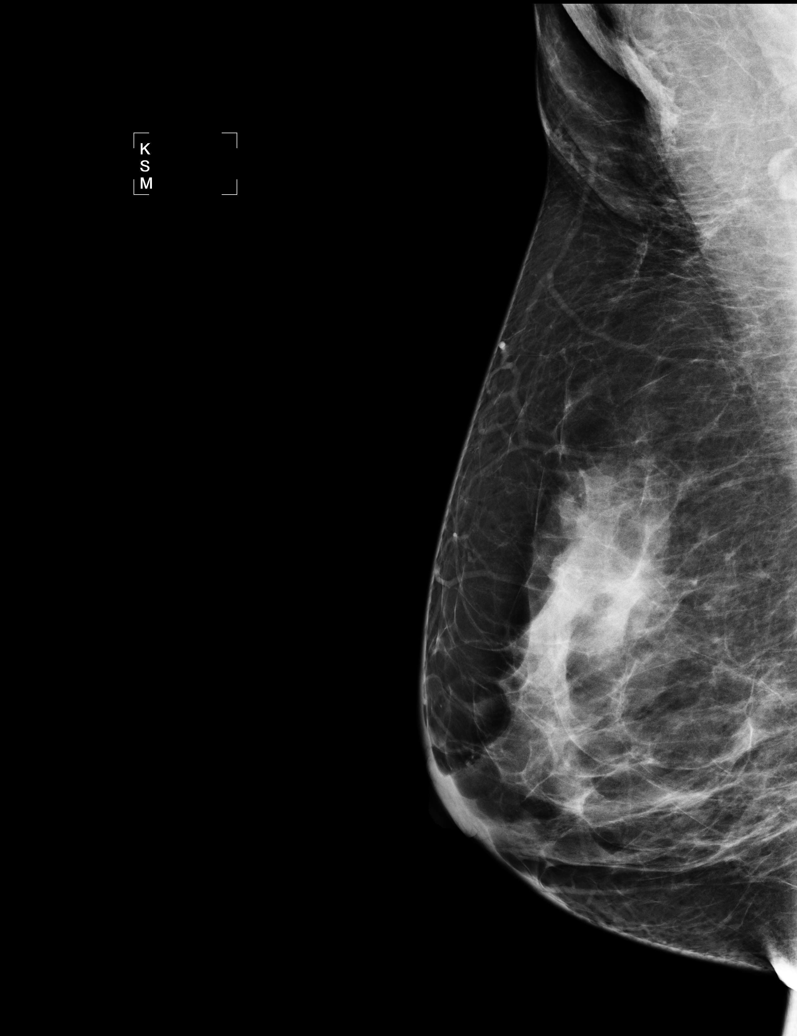

[R CC (2 of 2)]
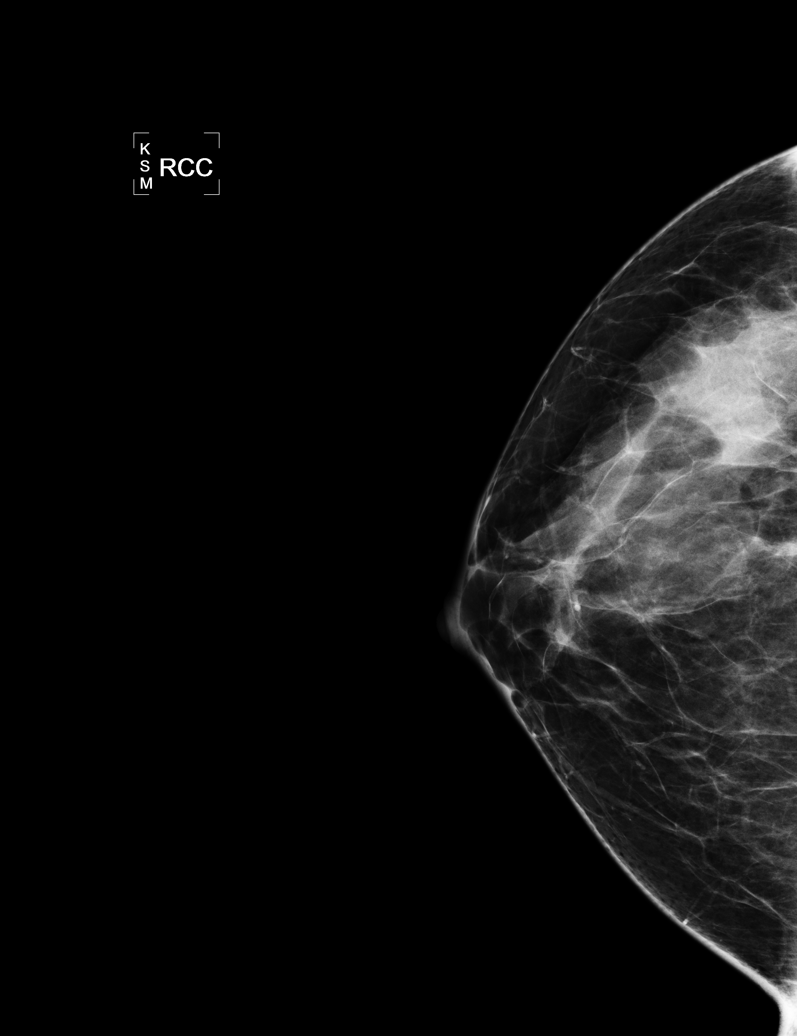

[5 of 5 positions shown; findings below may reference images not displayed]

IMPRESSION: No specific mammographic evidence of malignancy.  Next screening mammogram is recommended in one 
year.

A result letter of this screening mammogram will be mailed directly to the patient.

ASSESSMENT: Negative - BI-RADS 1

Screening mammogram in 1 year.
,

## 2010-01-05 ENCOUNTER — Ambulatory Visit: Payer: Self-pay | Admitting: Endocrinology

## 2010-01-05 DIAGNOSIS — R252 Cramp and spasm: Secondary | ICD-10-CM | POA: Insufficient documentation

## 2010-03-09 ENCOUNTER — Encounter (INDEPENDENT_AMBULATORY_CARE_PROVIDER_SITE_OTHER): Payer: Self-pay | Admitting: *Deleted

## 2010-03-10 LAB — CONVERTED CEMR LAB
ALT: 15 units/L (ref 0–35)
AST: 21 units/L (ref 0–37)
Albumin: 3.7 g/dL (ref 3.5–5.2)
Alkaline Phosphatase: 66 units/L (ref 39–117)
BUN: 22 mg/dL (ref 6–23)
Basophils Absolute: 0 10*3/uL (ref 0.0–0.1)
Basophils Relative: 0.5 % (ref 0.0–3.0)
Bilirubin Urine: NEGATIVE
Bilirubin, Direct: 0.1 mg/dL (ref 0.0–0.3)
CO2: 29 meq/L (ref 19–32)
Calcium: 9 mg/dL (ref 8.4–10.5)
Chloride: 98 meq/L (ref 96–112)
Cholesterol: 249 mg/dL — ABNORMAL HIGH (ref 0–200)
Creatinine, Ser: 0.8 mg/dL (ref 0.4–1.2)
Creatinine,U: 74.9 mg/dL
Direct LDL: 152.7 mg/dL
Eosinophils Absolute: 0.1 10*3/uL (ref 0.0–0.7)
Eosinophils Relative: 2.2 % (ref 0.0–5.0)
GFR calc non Af Amer: 99.53 mL/min (ref >60)
Glucose, Bld: 221 mg/dL — ABNORMAL HIGH (ref 70–99)
HCT: 36.5 % (ref 36.0–46.0)
HDL: 71.7 mg/dL (ref >39.00)
Hemoglobin: 12.3 g/dL (ref 12.0–15.0)
Hgb A1c MFr Bld: 11.9 % — ABNORMAL HIGH (ref 4.6–6.5)
Ketones, ur: NEGATIVE mg/dL
Lymphocytes Relative: 30.8 % (ref 12.0–46.0)
Lymphs Abs: 1.3 10*3/uL (ref 0.7–4.0)
MCHC: 33.8 g/dL (ref 30.0–36.0)
MCV: 84.4 fL (ref 78.0–100.0)
Microalb Creat Ratio: 2.8 mg/g (ref 0.0–30.0)
Microalb, Ur: 2.1 mg/dL — ABNORMAL HIGH (ref 0.0–1.9)
Monocytes Absolute: 0.3 10*3/uL (ref 0.1–1.0)
Monocytes Relative: 7.5 % (ref 3.0–12.0)
Neutro Abs: 2.5 10*3/uL (ref 1.4–7.7)
Neutrophils Relative %: 59 % (ref 43.0–77.0)
Nitrite: NEGATIVE
Platelets: 280 10*3/uL (ref 150.0–400.0)
Potassium: 4.6 meq/L (ref 3.5–5.1)
RBC: 4.32 M/uL (ref 3.87–5.11)
RDW: 13.9 % (ref 11.5–14.6)
Sodium: 136 meq/L (ref 135–145)
Specific Gravity, Urine: 1.01 (ref 1.000–1.030)
TSH: 0.82 microintl units/mL (ref 0.35–5.50)
Total Bilirubin: 0.6 mg/dL (ref 0.3–1.2)
Total CHOL/HDL Ratio: 3
Total Protein, Urine: NEGATIVE mg/dL
Total Protein: 6.8 g/dL (ref 6.0–8.3)
Triglycerides: 74 mg/dL (ref 0.0–149.0)
Urine Glucose: >=1000 mg/dL
Urobilinogen, UA: 0.2 (ref 0.0–1.0)
VLDL: 14.8 mg/dL (ref 0.0–40.0)
WBC: 4.3 10*3/uL — ABNORMAL LOW (ref 4.5–10.5)
pH: 6 (ref 5.0–8.0)

## 2010-03-12 ENCOUNTER — Encounter: Payer: Self-pay | Admitting: Endocrinology

## 2010-03-12 ENCOUNTER — Ambulatory Visit: Payer: Self-pay | Admitting: Endocrinology

## 2010-04-08 ENCOUNTER — Encounter: Admission: RE | Admit: 2010-04-08 | Discharge: 2010-04-08 | Payer: Self-pay | Admitting: Obstetrics and Gynecology

## 2010-07-12 ENCOUNTER — Encounter: Payer: Self-pay | Admitting: Endocrinology

## 2010-07-21 NOTE — Assessment & Plan Note (Signed)
Summary: FU--STC   Vital Signs:  Patient profile:   55 year old female Height:      64 inches (162.56 cm) Weight:      157.25 pounds (71.48 kg) BMI:     27.09 O2 Sat:      97 % on Room air Temp:     97.2 degrees F (36.22 degrees C) oral Pulse rate:   100 / minute BP sitting:   114 / 76  (left arm) Cuff size:   regular  Vitals Entered By: Brenton Grills MA (January 05, 2010 8:28 AM)  O2 Flow:  Room air CC: F/U appt/Refills/aj Comments pt is not currently taking Macrobid   Primary Provider:  Romero Belling, MD  CC:  F/U appt/Refills/aj.  History of Present Illness: pt does not know her pump settings, but says she takes approx 50 units humalog total per day.  no cbg record, but states cbg's are "high." pt states few days of intermittent slight cramps of the feet.  no assoc pain. she does not take colestipol.  Current Medications (verified): 1)  Glucosamine Sulfate 500 Mg Tabs (Glucosamine Sulfate) .... Take 1 Tablet By Mouth Once A Day 2)  Vitamin E 400 Unit Caps (Vitamin E) .... Take 2 Capsule By Mouth Twice A Day As Needed 3)  Synthroid 125 Mcg  Tabs (Levothyroxine Sodium) .... Take 1 By Mouth Qd 4)  Lactulose 10 Gm/81ml  Soln (Lactulose) .... 30 Cc Two Times A Day As Needed--Disp 2 Large Bottles 5)  Humalog 100 Unit/ml  Soln (Insulin Lispro (Human)) .... For Use in Pump Total 30 Units Qd 6)  Amiloride-Hydrochlorothiazide 5-50 Mg Tabs (Amiloride-Hydrochlorothiazide) .... Take 1/2 Tablet By Mouth Once Daily 7)  Cod Liver Oil  Oil (Cod Liver Oil) .... Take 1 Tablet By Mouth Once A Day 8)  Mylanta 200-200-20 Mg/80ml Susp (Alum & Mag Hydroxide-Simeth) .... Take As Needed 9)  Magnesium Oxide 400 Mg Tabs (Magnesium Oxide) .... Take 2 Pills Once Daily 10)  Analpram-Hc 1-2.5 %  Crea (Hydrocortisone Ace-Pramoxine) .... Apply To Rectum As Needed 11)  Nova Max Glucose Test  Strp (Glucose Blood) .Marland Kitchen.. 10/day, and Lancets 250.01   Variable Glucoses 12)  Macrobid 100 Mg Caps (Nitrofurantoin  Monohyd Macro) .Marland Kitchen.. 1 By Mouth Two Times A Day (Generic) 13)  Colestipol Hcl 5 Gm Pack (Colestipol Hcl) .Marland Kitchen.. 1 Pack Qd 14)  Quick Set .... For Medtronic Insulin Pump [id number] Change Q 3 Days 15)  Reservoir .... For Medtronic Insulin Pump Mmt 332a Change Q 3 Days  Allergies (verified): 1)  ! Lipitor 2)  ! Pcn 3)  ! Epinephrine 4)  ! * Florisine-Blue Dye 5)  ! Morphine 6)  ! Erythromycin 7)  ! * General Anesthesia 8)  ! Cipro  Past History:  Past Medical History: Last updated: 01/07/2007 Diabetes mellitus, type I Hypertension Hyperthyroidism LE Varicosities Elephantasis of feet Chronic constipation Dyslipidemia NON-Prolif D.R. DM nepharopathy Glaucoma Leukopenia  Review of Systems       The patient complains of weight gain.  The patient denies hypoglycemia.    Physical Exam  General:  normal appearance.   Pulses:  dorsalis pedis intact bilat.   Extremities:  no deformity.  no ulcer on the feet.  feet are of normal temp.  no edema.  there is bilat thickening and hyperpigmentation of the skin of the feet.  Neurologic:  sensation is intact to touch on the feet     Impression & Recommendations:  Problem # 1:  DIABETES MELLITUS,  TYPE I (ICD-250.01) therapy limited by noncompliance.  i'll do the best i can.  Problem # 2:  HYPERCHOLESTEROLEMIA (ICD-272.0) therapy limited by noncompliance.  i'll do the best i can.  Problem # 3:  CRAMP OF LIMB (ICD-729.82) uncertain etiology  Medications Added to Medication List This Visit: 1)  Humalog 100 Unit/ml Soln (Insulin lispro (human)) .... For use in pump total 50 units total per day 2)  Nova Max Glucose Test Strp (Glucose blood) .Marland Kitchen.. 10/day, 250.01   variable glucoses 3)  Paradigm Quick-set 43" 9mm Misc (Insulin infusion pump supplies) .... Change every 2 days, and paradigm 3 cc reserviors, [id number]-[id number] 4)  Paradigm Quick-set 43" 9mm Misc (Insulin infusion pump supplies) .... Change every 2 days 5)  Paradigm Quick-set 43" 9mm  Misc (Insulin infusion pump supplies) .... Mmt-396.  change every 2 days 6)  Paradigm Pump Reservoir 3ml Misc (Insulin infusion pump supplies) .... Quick-set, mmt-396.  change every 2 days 7)  Paradigm Pump Reservoir 3ml Misc (Insulin infusion pump supplies) .... Quick-set, mmt-332a.  change every 2 days  Other Orders: TLB-Lipid Panel (80061-LIPID) TLB-BMP (Basic Metabolic Panel-BMET) (80048-METABOL) TLB-CBC Platelet - w/Differential (85025-CBCD) TLB-Hepatic/Liver Function Pnl (80076-HEPATIC) TLB-TSH (Thyroid Stimulating Hormone) (84443-TSH) TLB-A1C / Hgb A1C (Glycohemoglobin) (83036-A1C) TLB-Udip w/ Micro (81001-URINE) TLB-Microalbumin/Creat Ratio, Urine (82043-MALB) Est. Patient Level IV (93716)  Patient Instructions: 1)  please write down your pump settings, and bring them to your physical appointment next month.   2)  blood tests are being ordered for you today.  please call [phone] to hear your test results. Prescriptions: PARADIGM PUMP RESERVOIR  MISC (INSULIN INFUSION PUMP SUPPLIES) quick-set, MMT-332A.  change every 2 days  #50 x 3   Entered and Authorized by:   Minus Breeding MD   Signed by:   Minus Breeding MD on 01/05/2010   Method used:   Faxed to ...       [facility] education [facility])       [address] [address]       [place], [place]  [address]       Ph: [phone]       Fax: [phone]   RxID:   [id number] PARADIGM QUICK-SET 43"  MISC (INSULIN INFUSION PUMP SUPPLIES) MMT-396.  change every 2 days  #50 x 3   Entered and Authorized by:   Minus Breeding MD   Signed by:   Minus Breeding MD on 01/05/2010   Method used:   Faxed to ...       [facility])       [address] [address]       [place], [place]  [address]       Ph: [phone]       Fax: [phone]   RxID:   [id number] PARADIGM PUMP RESERVOIR  MISC (INSULIN INFUSION PUMP SUPPLIES) quick-set, MMT-396.  change every 2 days  #50 x 3   Entered and Authorized by:   Minus Breeding MD   Signed by:    Minus Breeding MD on 01/05/2010   Method used:   Faxed to ...       [facility] education [facility])       [address] [address]       [place], [place]  [address]       Ph: [phone]       Fax: [phone]   RxID:   [id number] PARADIGM QUICK-SET 43"  MISC (INSULIN INFUSION PUMP SUPPLIES) change every 2 days  #50 x 3  Entered and Authorized by:   Minus Breeding MD   Signed by:   Minus Breeding MD on 01/05/2010   Method used:   Faxed to ...       [facility] education [facility])       [address] [address]       [place], [place]  [address]       Ph: [phone]       Fax: [phone]   RxID:   [id number] NOVA MAX GLUCOSE TEST  STRP (GLUCOSE BLOOD) 10/day, 250.01   variable glucoses  #900 x 3   Entered and Authorized by:   Minus Breeding MD   Signed by:   Minus Breeding MD on 01/05/2010   Method used:   Faxed to ...       [facility] education [facility])       [address] [address]       [place], [place]  [address]       Ph: [phone]       Fax: [phone]   RxID:   [id number] PARADIGM QUICK-SET 43"  MISC (INSULIN INFUSION PUMP SUPPLIES) change every 2 days, and paradigm 3 cc reserviors, mmt-3332-a  #50 of each x 3   Entered and Authorized by:   Minus Breeding MD   Signed by:   Minus Breeding MD on 01/05/2010   Method used:   Faxed to ...       [facility] education [facility])       [address] [address]       [place], [place]  [address]       Ph: [phone]       Fax: [phone]   RxID:   [id number] HUMALOG 100 UNIT/ML  SOLN (INSULIN LISPRO (HUMAN)) for use in pump total 50 units total per day  #5 vials x 3   Entered and Authorized by:   Minus Breeding MD   Signed by:   Minus Breeding MD on 01/05/2010   Method used:   Faxed to ...       [facility] education [facility])       [address] [address]       [place], [place]  [address]       Ph: [phone]       Fax: [phone]   RxID:   [id number]

## 2010-07-21 NOTE — Assessment & Plan Note (Signed)
Summary: CPX /NWS  #   Vital Signs:  Patient profile:   55 year old female Height:      64 inches (162.56 cm) Weight:      158.75 pounds (72.16 kg) BMI:     27.35 O2 Sat:      97 % on Room air Temp:     98.0 degrees F (36.67 degrees C) oral Pulse rate:   93 / minute BP sitting:   132 / 80  (left arm) Cuff size:   regular  Vitals Entered By: Brenton Grills MA (March 12, 2010 9:50 AM)  O2 Flow:  Room air CC: Physicl/aj Is Patient Diabetic? Yes   Primary Provider:  Romero Belling, MD  CC:  Physicl/aj.  History of Present Illness: here for regular wellness examination.  He's feeling pretty well in general, and does not drink or smoke.   Current Medications (verified): 1)  Glucosamine Sulfate 500 Mg Tabs (Glucosamine Sulfate) .... Take 1 Tablet By Mouth Once A Day 2)  Vitamin E 400 Unit Caps (Vitamin E) .... Take 2 Capsule By Mouth Twice A Day As Needed 3)  Synthroid 125 Mcg  Tabs (Levothyroxine Sodium) .... Take 1 By Mouth Qd 4)  Lactulose 10 Gm/44ml  Soln (Lactulose) .... 30 Cc Two Times A Day As Needed--Disp 2 Large Bottles 5)  Humalog 100 Unit/ml  Soln (Insulin Lispro (Human)) .... For Use in Pump Total 50 Units Total Per Day 6)  Amiloride-Hydrochlorothiazide 5-50 Mg Tabs (Amiloride-Hydrochlorothiazide) .... Take 1/2 Tablet By Mouth Once Daily 7)  Cod Liver Oil  Oil (Cod Liver Oil) .... Take 1 Tablet By Mouth Once A Day 8)  Mylanta 200-200-20 Mg/70ml Susp (Alum & Mag Hydroxide-Simeth) .... Take As Needed 9)  Magnesium Oxide 400 Mg Tabs (Magnesium Oxide) .... Take 2 Pills Once Daily 10)  Analpram-Hc 1-2.5 %  Crea (Hydrocortisone Ace-Pramoxine) .... Apply To Rectum As Needed 11)  Nova Max Glucose Test  Strp (Glucose Blood) .Marland Kitchen.. 10/day, 250.01   Variable Glucoses 12)  Colestipol Hcl 5 Gm Pack (Colestipol Hcl) .Marland Kitchen.. 1 Pack Qd 13)  Paradigm Quick-Set 43" 9mm  Misc (Insulin Infusion Pump Supplies) .... Mmt-396.  Change Every 2 Days 14)  Paradigm Pump Reservoir 3ml  Misc (Insulin  Infusion Pump Supplies) .... Quick-Set, Mmt-332a.  Change Every 2 Days  Allergies (verified): 1)  ! Lipitor 2)  ! Pcn 3)  ! Epinephrine 4)  ! * Florisine-Blue Dye 5)  ! Morphine 6)  ! Erythromycin 7)  ! * General Anesthesia 8)  ! Cipro  Family History: Reviewed history from 07/23/2008 and no changes required. mother had breast cancer Family History of Diabetes: Mother, Father  Social History: Reviewed history from 07/23/2008 and no changes required. married doesn't work outside the home Patient has never smoked.  Alcohol Use - no Daily Caffeine Use Illicit Drug Use - no Patient gets regular exercise.  Review of Systems       The patient complains of weight gain.  The patient denies vision loss, decreased hearing, syncope, dyspnea on exertion, prolonged cough, headaches, abdominal pain, melena, hematochezia, severe indigestion/heartburn, hematuria, suspicious skin lesions, and depression.    Physical Exam  General:  normal appearance.   Head:  head: no deformity external nose and ears are normal mouth: no lesion seen Eyes:  bilat proptosis, and slight periorbital swelling Neck:  Supple without thyroid enlargement or tenderness.  Lungs:  Clear to auscultation bilaterally. Normal respiratory effort.  Rectal:  sees gyn  Genitalia:  sees gyn  Msk:  muscle bulk and strength are grossly normal.  no obvious joint swelling.  gait is normal and steady  Extremities:  no deformity.  no ulcer on the feet.  feet are of normal temp.  no edema.  there is bilat thickening and hyperpigmentation of the skin of the feet.  Neurologic:  sensation is intact to touch on the feet, but decreased from normal (prob due to swelling, adn skin thickening).   Skin:  normal texture and temp.  no rash.  not diaphoretic  Cervical Nodes:  No significant adenopathy.  Psych:  Alert and cooperative; normal mood and affect; normal attention span and concentration.   Additional Exam:  SEPARATE  EVALUATION FOLLOWS--EACH PROBLEM HERE IS NEW, NOT RESPONDING TO TREATMENT, OR POSES SIGNIFICANT RISK TO THE PATIENT'S HEALTH: HISTORY OF THE PRESENT ILLNESS: no cbg record, but states cbg's are "high." pt states few weeks of slight pain at the perineal area in the context of urination.  no assoc fever. she does not take colestid. PAST MEDICAL HISTORY reviewed and up to date today REVIEW OF SYSTEMS: she also has hypoglycemia, with activity.  no chest pain PHYSICAL EXAMINATION: abdomen is soft, nontender.  no suprapubic tenderness.  no hepatosplenomegaly.   not distended.  no hernia dorsalis pedis intact bilat.  no carotid bruit heart: reg rate and rhythm.  no murmur LAB/XRAY RESULTS: ua: pyuria a1c: elevated ldl: elevated IMPRESSION: dm, needs increased rx uti dyslipidemia, new PLAN: see instruction sheet   Impression & Recommendations:  Problem # 1:  ROUTINE GENERAL MEDICAL EXAM@HEALTH  CARE FACL (ICD-V70.0)  Medications Added to Medication List This Visit: 1)  Nitrofurantoin Macrocrystal 100 Mg Caps (Nitrofurantoin macrocrystal) .Marland Kitchen.. 1 tab three times a day  Other Orders: EKG w/ Interpretation (93000) T-Urine Culture (Spectrum Order) [id number]) Est. Patient Level IV (14782) Est. Patient 40-64 years (95621)  Preventive Care Screening     gyn is dr    Patient Instructions: 1)  check urine culture. 2)  nitrofurantion 100 mg three times a day. 3)  you should avoid pregnancy until advised it is safe in terms of your diabetes. 4)  resume colestipol. 5)  cc dr Normand Sloop. 6)  continue basal rate of 0.7 units/hr, except for 0.5 units/hr, 2 am to noon am. 7)  continue mealtime bolus of 1 unit/ 15 grams carbohydrate 8)  continue correction bolus (which some people call "sensitivity," or "insulin sensitivity ratio," or just "isr") of 1 unit for each 50 by which your glucose exceeds 100. 9)  check your blood sugar 4 times a day--before the 3 meals, and at bedtime.  also check  if you have symptoms of your blood sugar being too high or too low.  please keep a record of the readings and bring it to your next appointment here.  please call us sooner if you are having low blood sugar episodes. 10)  Please schedule a follow-up appointment in 1 month. 11)  please consider these measures for your health:  minimize alcohol.  do not use tobacco products.  have a colonoscopy at least every 10 years from age 49.  keep firearms safely stored.  always use seat belts.  have working smoke alarms in your home.  see an eye doctor and dentist regularly.  never drive under the influence of alcohol or drugs (including prescription drugs).  Prescriptions: COLESTIPOL HCL 5 GM PACK (COLESTIPOL HCL) 1 pack qd  #30 x 11   Entered and Authorized by:   Minus Breeding MD   Signed  by:   Minus Breeding MD on 03/12/2010   Method used:   Electronically to        [facility] [phone]* (retail)       [address]       [place], [place]  [address]       Ph: [phone]       Fax: [phone]   RxID:   [id number] NITROFURANTOIN MACROCRYSTAL 100 MG CAPS (NITROFURANTOIN MACROCRYSTAL) 1 tab three times a day  #21 x 0   Entered and Authorized by:   Minus Breeding MD   Signed by:   Minus Breeding MD on 03/12/2010   Method used:   Electronically to        [facility] [phone]* (retail)       [address]       [place], [place]  [address]       Ph: [phone]       Fax: [phone]   RxID:   [id number]

## 2010-10-01 LAB — GLUCOSE, CAPILLARY
Glucose-Capillary: 165 mg/dL — ABNORMAL HIGH (ref 70–99)
Glucose-Capillary: 204 mg/dL — ABNORMAL HIGH (ref 70–99)

## 2010-11-06 NOTE — Op Note (Signed)
[place]  Patient:    Katherine Navarro, Katherine Navarro Visit Number: [id number] MRN: [id number]          Service Type: DSU Location: [facility] [id number] [id number] Attending Physician:  Haydee Salter Dictated by:   Kellie Simmering Eulah Pont, M.D. Proc. Date: 10/30/01 Admit Date:  10/30/2001 Discharge Date: 10/30/2001                             Operative Report  PREOPERATIVE DIAGNOSIS:  Neovascular glaucoma, left eye.  POSTOPERATIVE DIAGNOSIS:  Neovascular glaucoma, left eye.  PROCEDURE:  Implantation of a Baerveldt glaucoma implant with Tutoplast tissue graft, left eye.  SURGEON:  Kellie Simmering. Eulah Pont, M.D.  ANESTHESIA:  Local with standby.  INDICATIONS AND JUSTIFICATION FOR SURGERY:  The patient is a 55 year old insulin-dependent diabetic with proliferative diabetic retinopathy.  She underwent cataract surgery on both eyes, both within the last six months.  She presented early this year with proliferative diabetic retinopathy in the left eye, which underwent panretinal photocoagulation.  She also developed rubeosis iridis and neovascular glaucoma, left eye.  Despite medical therapy, the intraocular pressure has been out of control, and the eye has become painful. The risks, benefits, and alternative therapies were discussed with the patient.  She elected to proceed with a Baerveldt glaucoma implant with a Tutoplast tissue graft to lower the intraocular pressure in her left eye and maintain a comfortable eye with some vision.  JUSTIFICATION FOR OUTPATIENT SURGERY:  Inpatient not required.  JUSTIFICATION FOR OVERNIGHT STAY:  Not needed.  DESCRIPTION OF PROCEDURE:  The patient was brought to operating room 2, where she was carefully positioned on the operating room table.  She received IV sedation, and Dr. Eulah Pont performed a left retrobulbar injection.  The skin about the left eye and left side of the face was prepped and draped as a sterile field.  It was recognized that she  had an increased amount of subconjunctival blood with some tension in the orbit.  This was felt to represent a mild retrobulbar hemorrhage secondary to the retrobulbar injection.  The bleeding appeared to have stopped, and the pressure was not increased over the pressure prior to the injection.  It was decided to proceed with the case.  A 6-0 black silk suture was passed partial-thickness through the superior cornea and used to retract the globe in a downward and inward position.  An incision was made through conjunctiva and Tenons capsule 6 mm posterior to the limbus in the superior temporal quadrant between the superior rectus muscle and lateral rectus muscle tendon insertions.  This incision was then extended to the 12:30 and 2 oclock positions.  The dissection was carried posteriorly from the incision between the insertion of the two recti muscles.  The superior rectus was then isolated on a muscle hook.  A second muscle hook was used to create a blunt dissection beneath the superior rectus and permit the Baerveldt glaucoma implant to be placed beneath the superior rectus muscle.  The lateral rectus muscle tendon was then isolated on the muscle hook and the inferior wing of the Baerveldt glaucoma implant was placed beneath the lateral rectus muscle tendon.  The implant was then fixed to the globe with two 8-0 nylon sutures 12 mm posterior to the limbus.  Attention was then turned to the anterior edge of the initial incision, and this was carried forward as a Tenons capsule conjunctival flap with its base at the limbus. Hemostasis  was obtained with bipolar cautery.  A paracentesis tract was made in the inferior temporal limbus at the 4 oclock position, allowing one drop of aqueous to egress.  A 22-gauge needle on viscoelastic was then used to enter the anterior chamber beneath the superior temporal conjunctival Tenons flap with the needle being in the plane of the iris.  Viscoelastic  was injected into the anterior chamber.  The tube on the Baerveldt implant was then trimmed and placed into the anterior chamber.  It was found to be in good position and of proper length.  This was withdrawn from the anterior chamber, and the tube with the Baerveldt implant was then occluded by placing a small segment of 5-0 nylon along the outside of the tube and then crushing the tube by tying the 5-0 nylon to the exterior of the tube with two 8-0 Vicryl sutures.  The patency of the tube was then tested and found that it was occluded.  The Baerveldt tube was placed back into the anterior chamber and an 8-0 nylon suture was placed around the tube and fixed to the sclera to prevent the tube from backing out of the anterior chamber.  Two slits were made in the tube with a 15 degree Supersharp blade to allow venting.  A piece of Tutoplast double-thickness was fashioned to cover the tube from its exit from the anterior chamber to the insertion into the implant.  This was fixed to the sclera with four 8-0 Vicryl sutures.  A tunnel was made in the conjunctiva laterally to allow the tip of the 5-0 nylon suture to be buried beneath the conjunctiva and its tip to be visible inferotemporally.  The conjunctival Tenons flap was then repaired with a running 8-0 Vicryl suture, locking it with every second bite.  The retraction suture was cut and removed.  The anterior chamber was then irrigated to remove as much viscoelastic as possible.  During the final stages of the procedure, two drops of scopolamine and two drops of Ocuflox were applied to the eye every four minutes for a total of four applications.  Subconjunctival Decadron was injected in the inferotemporal quadrant 0.5 cc.  The lid speculum was removed.  The surgical drape was removed.  The eye was dressed with additional drops of scopolamine and Ocuflox.  The conjunctiva was easily reduced to be completely within the closed lids.  A light  patch was placed over the eye with an eye shield.  The patient was transported to the recovery area in good condition.  She was to be followed up in my office on Oct 31, 2001, at 8:30 a.m.  Dictated by:   Kellie Simmering Eulah Pont, M.D. Attending Physician:  Haydee Salter DD:  10/30/01 TD:  11/01/01 Job: [id number]

## 2010-11-13 ENCOUNTER — Ambulatory Visit (INDEPENDENT_AMBULATORY_CARE_PROVIDER_SITE_OTHER): Payer: BC Managed Care – PPO | Admitting: Endocrinology

## 2010-11-13 ENCOUNTER — Other Ambulatory Visit: Payer: Self-pay | Admitting: *Deleted

## 2010-11-13 ENCOUNTER — Encounter: Payer: Self-pay | Admitting: Endocrinology

## 2010-11-13 VITALS — BP 122/80 | HR 101 | Temp 98.6°F | Ht 63.5 in | Wt 158.0 lb

## 2010-11-13 DIAGNOSIS — R209 Unspecified disturbances of skin sensation: Secondary | ICD-10-CM

## 2010-11-13 DIAGNOSIS — E109 Type 1 diabetes mellitus without complications: Secondary | ICD-10-CM

## 2010-11-13 MED ORDER — PARADIGM PUMP RESERVOIR 3ML MISC: Status: DC

## 2010-11-13 MED ORDER — DOXYCYCLINE HYCLATE 100 MG PO TABS: 100.0000 mg | ORAL_TABLET | Freq: Two times a day (BID) | ORAL | Status: AC

## 2010-11-13 MED ORDER — AMILORIDE-HYDROCHLOROTHIAZIDE 5-50 MG PO TABS: ORAL_TABLET | ORAL | Status: DC

## 2010-11-13 MED ORDER — LEVOTHYROXINE SODIUM 125 MCG PO TABS: 125.0000 ug | ORAL_TABLET | Freq: Every day | ORAL | Status: DC

## 2010-11-13 MED ORDER — "PARADIGM QUICK-SET 43"" 9MM MISC": Status: DC

## 2010-11-13 MED ORDER — DOXYCYCLINE HYCLATE 100 MG PO TABS: 100.0000 mg | ORAL_TABLET | Freq: Two times a day (BID) | ORAL | Status: DC

## 2010-11-13 MED ORDER — GLUCOSE BLOOD VI STRP: ORAL_STRIP | Status: DC

## 2010-11-13 NOTE — Progress Notes (Signed)
Subjective:    Patient ID: Katherine Navarro, female    DOB: [birth year], 55 y.o.   MRN: [id number]  HPI no cbg record, but states cbg's are "not good."  She says she has "lows and highs."   Pt states a few weeks of moderate congestion in the nose, but no assoc earache.   She has slight numbness of the feet. Past Medical History  Diagnosis Date  . DIABETES MELLITUS, TYPE I 01/07/2007  . Unspecified hypothyroidism 12/13/2007  . HYPERCHOLESTEROLEMIA 12/13/2007  . LEUKOPENIA, CHRONIC 12/13/2007  . Nonproliferative diabetic retinopathy NOS 12/13/2007  . GLAUCOMA 12/13/2007  . HYPERTENSION 01/07/2007  . CONSTIPATION, CHRONIC 12/13/2007  . INTERNAL HEMORRHOIDS 07/23/2008  . VARICOSE VEINS, LOWER EXTREMITIES 12/13/2007  . Hyperthyroidism   . DM nephropathy/sclerosis     Past Surgical History  Procedure Date  . Tubal ligation   . Eye surgery     bilateral  . Stress myoview  05/18/2004  . Electrocardiogram 08/24/2006    History   Social History  . Marital Status: Married    Spouse Name: N/A    Number of Children: N/A  . Years of Education: N/A   Occupational History  .      Doesn't work outside the home   Social History Main Topics  . Smoking status: Never Smoker   . Smokeless tobacco: Not on file  . Alcohol Use: No  . Drug Use: No  . Sexually Active:    Other Topics Concern  . Not on file   Social History Narrative   Pt gets regular exercise.    Current Outpatient Prescriptions on File Prior to Visit  Medication Sig Dispense Refill  . alum & mag hydroxide-simeth (MYLANTA) 200-200-20 MG/5ML suspension as needed.        . colestipol (COLESTID) 5 G granules Take 5 g by mouth daily.        . Glucosamine 500 MG TABS Take 1 tablet by mouth daily.        . hydrocortisone-pramoxine (ANALPRAM-HC) 2.5-1 % rectal cream Apply to rectum as needed       . insulin lispro (HUMALOG) 100 UNIT/ML injection For use in pump total 50 units total per day       . magnesium oxide (MAG-OX) 400 MG  tablet Take 2 tablets once daily       . vitamin E 400 UNIT capsule Take 2 capsules by mouth twice a day as needed       . Cod Liver Oil CAPS Take 1 capsule by mouth daily.          Allergies  Allergen Reactions  . Atorvastatin     REACTION: perceived myalgias  . Ciprofloxacin     REACTION: pt states med  make her sick  . Epinephrine     REACTION: thyroid problems  . Erythromycin   . Morphine   . Penicillins     REACTION: hives    Family History  Problem Relation Age of Onset  . Cancer Mother     Breast Cancer  . Diabetes Mother   . Diabetes Father     BP 122/80  Pulse 101  Temp(Src) 98.6 F (37 C) (Oral)  Ht 5' 3.5" (1.613 m)  Wt 158 lb (71.668 kg)  BMI 27.55 kg/m2  SpO2 96%    Review of Systems Denies loc and fever.    Objective:   Physical Exam GENERAL: no distress head: no deformity eyes: no periorbital swelling.  There is bilat proptosis external  nose and ears are normal mouth: no lesion seen Both eac's and tm's are normal Neck - No masses or thyromegaly or limitation in range of motion. LUNGS:  Clear to auscultation Pulses: dorsalis pedis intact bilat.   Feet: no deformity.  no ulcer on the feet.  feet are of normal color and temp.  no edema.  There is hyperpigmentation and swelling of the feet, but this is much improved from a few years ago.   Neuro: sensation is intact to touch on the feet, but decreased from normal.      Assessment & Plan:  Dm, therapy limited by noncompliance with cbg recording.  i'll do the best i can. She seems to have a combination of allergic rhinitis and uri Numbness, prob due to dm

## 2010-11-13 NOTE — Telephone Encounter (Signed)
Rx for pump supplies faxed to [facility]. Refills for Synthroid and Moduretic sent to [facility]

## 2010-11-13 NOTE — Patient Instructions (Addendum)
continue basal rate of 0.7 units/hr, except for 0.5 units/hr, 2 am to noon. continue mealtime bolus of 1 unit/ 15 grams carbohydrate continue correction bolus (which some people call "sensitivity," or "insulin sensitivity ratio," or just "isr") of 1 unit for each 50 by which your glucose exceeds 100. check your blood sugar 4 times a day--before the 3 meals, and at bedtime.  also check if you have symptoms of your blood sugar being too high or too low.  please keep a record of the readings and bring it to your next appointment here.  please call us sooner if you are having low blood sugar episodes.  This is the next step in the treatment of your diabetes.   Please schedule a follow-up appointment in 1 month. good diet and exercise habits significanly improve the control of your diabetes.  please let me know if you wish to be referred to a dietician.  high blood sugar is very risky to your health.  you should see an eye doctor every year. controlling your blood pressure and cholesterol drastically reduces the damage diabetes does to your body.  this also applies to quitting smoking.  please discuss these with your doctor.  you should take an aspirin every day, unless you have been advised by a doctor not to. blood tests are being ordered for you today.  please call [phone] to hear your test results.  You will be prompted to enter the 9-digit "MRN" number that appears at the top left of this page, followed by #.  Then you will hear the message. i have sent a prescription to your pharmacy for an antibiotic. blood tests are being ordered for you today.  please call [phone] to hear your test results.  You will be prompted to enter the 9-digit "MRN" number that appears at the top left of this page, followed by #.  Then you will hear the message. Loratadine-d (non-prescription) also helps your congestion.   I hope you feel better soon.  If you don't feel better by next week, please call back.

## 2010-11-13 NOTE — Telephone Encounter (Signed)
Pt needs refills of Insulin pump supplies to be sent to [facility] and Synthroid and Moduretic to [facility].

## 2010-11-30 ENCOUNTER — Other Ambulatory Visit: Payer: Self-pay

## 2010-11-30 MED ORDER — INSULIN LISPRO 100 UNIT/ML ~~LOC~~ SOLN: 50.0000 [IU] | Freq: Every day | SUBCUTANEOUS | Status: DC

## 2010-11-30 MED ORDER — GLUCOSE BLOOD VI STRP: ORAL_STRIP | Status: DC

## 2011-03-01 ENCOUNTER — Other Ambulatory Visit: Payer: Self-pay | Admitting: Obstetrics and Gynecology

## 2011-03-01 DIAGNOSIS — Z1231 Encounter for screening mammogram for malignant neoplasm of breast: Secondary | ICD-10-CM

## 2011-04-12 ENCOUNTER — Ambulatory Visit
Admission: RE | Admit: 2011-04-12 | Discharge: 2011-04-12 | Disposition: A | Discharge status: Home / Self Care | Payer: BC Managed Care – PPO | Source: Ambulatory Visit | Attending: Obstetrics and Gynecology | Admitting: Obstetrics and Gynecology

## 2011-04-12 DIAGNOSIS — Z1231 Encounter for screening mammogram for malignant neoplasm of breast: Secondary | ICD-10-CM

## 2011-04-29 ENCOUNTER — Other Ambulatory Visit: Payer: Self-pay | Admitting: Endocrinology

## 2011-04-29 ENCOUNTER — Telehealth: Payer: Self-pay | Admitting: *Deleted

## 2011-04-29 NOTE — Telephone Encounter (Signed)
Pt advised and will call back to schedule ROV after holiday season.

## 2011-04-29 NOTE — Telephone Encounter (Signed)
Per MD, pt is due for F/U OV. Left message for pt to callback office to schedule appointment.

## 2011-05-18 ENCOUNTER — Ambulatory Visit (INDEPENDENT_AMBULATORY_CARE_PROVIDER_SITE_OTHER): Payer: BC Managed Care – PPO | Admitting: Endocrinology

## 2011-05-18 ENCOUNTER — Encounter: Payer: Self-pay | Admitting: Endocrinology

## 2011-05-18 DIAGNOSIS — D72819 Decreased white blood cell count, unspecified: Secondary | ICD-10-CM

## 2011-05-18 DIAGNOSIS — E7800 Pure hypercholesterolemia, unspecified: Secondary | ICD-10-CM

## 2011-05-18 DIAGNOSIS — I10 Essential (primary) hypertension: Secondary | ICD-10-CM

## 2011-05-18 DIAGNOSIS — E109 Type 1 diabetes mellitus without complications: Secondary | ICD-10-CM

## 2011-05-18 DIAGNOSIS — E039 Hypothyroidism, unspecified: Secondary | ICD-10-CM

## 2011-05-18 DIAGNOSIS — Z79899 Other long term (current) drug therapy: Secondary | ICD-10-CM

## 2011-05-18 MED ORDER — DOXYCYCLINE HYCLATE 100 MG PO TABS: 100.0000 mg | ORAL_TABLET | Freq: Two times a day (BID) | ORAL | Status: AC

## 2011-05-18 NOTE — Patient Instructions (Addendum)
blood tests are being requested for you today.  please call [phone] to hear your test results.  You will be prompted to enter the 9-digit "MRN" number that appears at the top left of this page, followed by #.  Then you will hear the message. pending the test results, please: reduce basal rate to 0.5 units/hr, 24 hrs per day. increase mealtime bolus to 1 unit/ 12 grams carbohydrate. continue correction bolus (which some people call "sensitivity," or "insulin sensitivity ratio," or just "isr") of 1 unit for each 50 by which your glucose exceeds 100. check your blood sugar 4 times a day--before the 3 meals, and at bedtime.  also check if you have symptoms of your blood sugar being too high or too low.  please keep a record of the readings and bring it to your next appointment here.  please call us sooner if you are having low blood sugar episodes.  This is the next step in the treatment of your diabetes.     Please come back for a regular physical appointment in 3 months.  i have sent a prescription to your pharmacy, for an antibiotic.

## 2011-05-18 NOTE — Progress Notes (Signed)
Subjective:    Patient ID: Katherine Navarro, female    DOB: [birth year], 55 y.o.   MRN: [id number]  HPI Pt returns for f/u of type 1 dm (1978).  pt states she feels well in general.  no cbg record, but states cbg's are extremely variable.  She says it is low in the middle of the night.  She averages approx 50 units per day, via her pump.   Pt states 1 week of slight swelling at the left malar area, and assoc pain. She has gained weight. Past Medical History  Diagnosis Date  . DIABETES MELLITUS, TYPE I 01/07/2007  . Unspecified hypothyroidism 12/13/2007  . HYPERCHOLESTEROLEMIA 12/13/2007  . LEUKOPENIA, CHRONIC 12/13/2007  . Nonproliferative diabetic retinopathy NOS 12/13/2007  . GLAUCOMA 12/13/2007  . HYPERTENSION 01/07/2007  . CONSTIPATION, CHRONIC 12/13/2007  . INTERNAL HEMORRHOIDS 07/23/2008  . VARICOSE VEINS, LOWER EXTREMITIES 12/13/2007  . Hyperthyroidism   . DM nephropathy/sclerosis     Past Surgical History  Procedure Date  . Tubal ligation   . Eye surgery     bilateral  . Stress myoview  05/18/2004  . Electrocardiogram 08/24/2006    History   Social History  . Marital Status: Married    Spouse Name: N/A    Number of Children: N/A  . Years of Education: N/A   Occupational History  .      Doesn't work outside the home   Social History Main Topics  . Smoking status: Never Smoker   . Smokeless tobacco: Not on file  . Alcohol Use: No  . Drug Use: No  . Sexually Active:    Other Topics Concern  . Not on file   Social History Narrative   Pt gets regular exercise.    Current Outpatient Prescriptions on File Prior to Visit  Medication Sig Dispense Refill  . alum & mag hydroxide-simeth (MYLANTA) 200-200-20 MG/5ML suspension as needed.        Marland Kitchen amiloride-hydrochlorothiazide (MODURETIC) 5-50 MG tablet TAKE ONE-HALF TABLET BY MOUTH EVERY DAY  45 tablet  1  . Cod Liver Oil CAPS Take 1 capsule by mouth daily.        . colestipol (COLESTID) 5 G granules Take 5 g by mouth  daily.        . Glucosamine 500 MG TABS Take 1 tablet by mouth daily.        . hydrocortisone-pramoxine (ANALPRAM-HC) 2.5-1 % rectal cream Apply to rectum as needed       . Insulin Infusion Pump Supplies (MINIMED INFUSION SET-MMT 396) MISC MMT-396. Change every 2 days  50 each  2  . insulin lispro (HUMALOG) 100 UNIT/ML injection Inject 50 Units into the skin daily. For use in pump total 50 units total per day  40 mL  2  . levothyroxine (SYNTHROID, LEVOTHROID) 125 MCG tablet TAKE ONE TABLET BY MOUTH EVERY DAY  90 tablet  1  . magnesium oxide (MAG-OX) 400 MG tablet Take 2 tablets once daily       . vitamin E 400 UNIT capsule Take 2 capsules by mouth twice a day as needed       . Insulin Infusion Pump Supplies (PARADIGM RESERVOIR ) MISC Quick-set, MMT-332A. Change every 2 days  50 each  2    Allergies  Allergen Reactions  . Atorvastatin     REACTION: perceived myalgias  . Ciprofloxacin     REACTION: pt states med  make her sick  . Epinephrine     REACTION: thyroid problems  .  Erythromycin   . Morphine   . Penicillins     REACTION: hives    Family History  Problem Relation Age of Onset  . Cancer Mother     Breast Cancer  . Diabetes Mother   . Diabetes Father     BP 134/78  Pulse 106  Temp(Src) 98.7 F (37.1 C) (Oral)  Ht 5' 3.5" (1.613 m)  Wt 158 lb (71.668 kg)  BMI 27.55 kg/m2  SpO2 95%  LMP 05/14/2011  Review of Systems Denies loc/chest pain/sob/fever    Objective:   Physical Exam VITAL SIGNS:  See vs page Left malar area: slight tend and swelling, which does not involve the eye GENERAL: no distress Pulses: dorsalis pedis intact bilat.   Feet: no deformity.  no ulcer on the feet.  feet are of normal color and temp.  no edema.  There is hyperpigmentation of the feet, but swelling is much better.   Neuro: sensation is intact to touch on the feet.      Assessment & Plan:  Mild cellulitis of the face, new DM: therapy limited by noncompliance.  i'll do the best  i can.

## 2011-05-31 ENCOUNTER — Other Ambulatory Visit: Payer: Self-pay

## 2011-05-31 MED ORDER — GLUCOSE BLOOD VI STRP: ORAL_STRIP | Status: DC

## 2011-05-31 MED ORDER — LEVOTHYROXINE SODIUM 125 MCG PO TABS: 125.0000 ug | ORAL_TABLET | Freq: Every day | ORAL | Status: DC

## 2011-05-31 MED ORDER — COLESTIPOL HCL 5 G PO GRAN: 5.0000 g | GRANULES | Freq: Every day | ORAL | Status: DC

## 2011-05-31 MED ORDER — PARADIGM PUMP RESERVOIR 3ML MISC: Status: DC

## 2011-05-31 MED ORDER — "PARADIGM QUICK-SET 43"" 9MM MISC": Status: DC

## 2011-05-31 MED ORDER — HYDROCORTISONE ACE-PRAMOXINE 2.5-1 % RE CREA: TOPICAL_CREAM | RECTAL | Status: DC | PRN

## 2011-05-31 MED ORDER — AMILORIDE-HYDROCHLOROTHIAZIDE 5-50 MG PO TABS: 0.5000 | ORAL_TABLET | Freq: Every day | ORAL | Status: DC

## 2011-05-31 MED ORDER — INSULIN LISPRO 100 UNIT/ML ~~LOC~~ SOLN: 50.0000 [IU] | Freq: Every day | SUBCUTANEOUS | Status: DC

## 2011-06-02 ENCOUNTER — Telehealth: Payer: Self-pay | Admitting: *Deleted

## 2011-06-02 NOTE — Telephone Encounter (Signed)
Pharmacist at [facility] informed.

## 2011-06-02 NOTE — Telephone Encounter (Signed)
ok 

## 2011-06-02 NOTE — Telephone Encounter (Signed)
R'cd fax from [facility] regarding Analpram-HC rx. They want MD's advisement to use generic rx.

## 2011-06-07 DIAGNOSIS — E113599 Type 2 diabetes mellitus with proliferative diabetic retinopathy without macular edema, unspecified eye: Secondary | ICD-10-CM | POA: Insufficient documentation

## 2011-07-14 ENCOUNTER — Other Ambulatory Visit (INDEPENDENT_AMBULATORY_CARE_PROVIDER_SITE_OTHER): Payer: BC Managed Care – PPO

## 2011-07-14 ENCOUNTER — Encounter: Payer: Self-pay | Admitting: Endocrinology

## 2011-07-14 ENCOUNTER — Ambulatory Visit (INDEPENDENT_AMBULATORY_CARE_PROVIDER_SITE_OTHER): Payer: BC Managed Care – PPO | Admitting: Endocrinology

## 2011-07-14 DIAGNOSIS — Z79899 Other long term (current) drug therapy: Secondary | ICD-10-CM

## 2011-07-14 DIAGNOSIS — E7800 Pure hypercholesterolemia, unspecified: Secondary | ICD-10-CM

## 2011-07-14 DIAGNOSIS — E109 Type 1 diabetes mellitus without complications: Secondary | ICD-10-CM

## 2011-07-14 DIAGNOSIS — R209 Unspecified disturbances of skin sensation: Secondary | ICD-10-CM

## 2011-07-14 DIAGNOSIS — I10 Essential (primary) hypertension: Secondary | ICD-10-CM

## 2011-07-14 DIAGNOSIS — E039 Hypothyroidism, unspecified: Secondary | ICD-10-CM

## 2011-07-14 DIAGNOSIS — D72819 Decreased white blood cell count, unspecified: Secondary | ICD-10-CM

## 2011-07-14 LAB — URINALYSIS, ROUTINE W REFLEX MICROSCOPIC
Bilirubin Urine: NEGATIVE
Ketones, ur: NEGATIVE
Leukocytes, UA: NEGATIVE
pH: 6 (ref 5.0–8.0)

## 2011-07-14 LAB — BASIC METABOLIC PANEL
BUN: 22 mg/dL (ref 6–23)
Chloride: 100 mEq/L (ref 96–112)
Glucose, Bld: 272 mg/dL — ABNORMAL HIGH (ref 70–99)
Potassium: 4.8 mEq/L (ref 3.5–5.1)

## 2011-07-14 LAB — LIPID PANEL
Cholesterol: 300 mg/dL — ABNORMAL HIGH (ref 0–200)
Total CHOL/HDL Ratio: 4
Triglycerides: 76 mg/dL (ref 0.0–149.0)

## 2011-07-14 LAB — CBC WITH DIFFERENTIAL/PLATELET
Basophils Relative: 0.8 % (ref 0.0–3.0)
Eosinophils Relative: 1.9 % (ref 0.0–5.0)
HCT: 39.5 % (ref 36.0–46.0)
Hemoglobin: 13.2 g/dL (ref 12.0–15.0)
Lymphs Abs: 1.1 10*3/uL (ref 0.7–4.0)
Monocytes Relative: 6.4 % (ref 3.0–12.0)
Neutro Abs: 2.7 10*3/uL (ref 1.4–7.7)
RBC: 4.72 Mil/uL (ref 3.87–5.11)
RDW: 14 % (ref 11.5–14.6)
WBC: 4.2 10*3/uL — ABNORMAL LOW (ref 4.5–10.5)

## 2011-07-14 LAB — MICROALBUMIN / CREATININE URINE RATIO: Microalb, Ur: 1.8 mg/dL (ref 0.0–1.9)

## 2011-07-14 LAB — HEPATIC FUNCTION PANEL
ALT: 20 U/L (ref 0–35)
AST: 25 U/L (ref 0–37)
Albumin: 3.9 g/dL (ref 3.5–5.2)
Total Protein: 7.9 g/dL (ref 6.0–8.3)

## 2011-07-14 LAB — LDL CHOLESTEROL, DIRECT: Direct LDL: 198.7 mg/dL

## 2011-07-14 MED ORDER — "PARADIGM QUICK-SET 43"" 9MM MISC": Status: DC

## 2011-07-14 MED ORDER — PARADIGM PUMP RESERVOIR 3ML MISC: Status: DC

## 2011-07-14 MED ORDER — DOXYCYCLINE HYCLATE 100 MG PO TABS: 100.0000 mg | ORAL_TABLET | Freq: Two times a day (BID) | ORAL | Status: AC

## 2011-07-14 MED ORDER — PROMETHAZINE-CODEINE 6.25-10 MG/5ML PO SYRP: 5.0000 mL | ORAL_SOLUTION | ORAL | Status: AC | PRN

## 2011-07-14 MED ORDER — GLUCOSE BLOOD VI STRP: ORAL_STRIP | Status: DC

## 2011-07-14 NOTE — Patient Instructions (Addendum)
blood tests are being requested for you today.  please call [phone] to hear your test results.  You will be prompted to enter the 9-digit "MRN" number that appears at the top left of this page, followed by #.  Then you will hear the message. pending the test results, please: continue basal rate of 0.5 units/hr, 24 hrs per day. continue mealtime bolus of 1 unit/ 12 grams carbohydrate. continue correction bolus (which some people call "sensitivity," or "insulin sensitivity ratio," or just "isr") of 1 unit for each 50 by which your glucose exceeds 100. check your blood sugar 4 times a day--before the 3 meals, and at bedtime.  also check if you have symptoms of your blood sugar being too high or too low.  please keep a record of the readings and bring it to your next appointment here.  please call us sooner if you are having low blood sugar episodes.  This is the next step in the treatment of your diabetes.     Please come back for a regular physical appointment in 3 months.  i have sent a prescription to your pharmacy, for an antibiotic. Here is a prescription for cough syrup. (update: i left message on phone-tree:  Ret with cbg record.  Take low-chol diet)

## 2011-07-14 NOTE — Progress Notes (Signed)
Subjective:    Patient ID: Katherine Navarro, female    DOB: [birth year], 56 y.o.   MRN: [id number]  HPI Pt states few weeks of slight pain at the throat, and assoc headache.   Past Medical History  Diagnosis Date  . DIABETES MELLITUS, TYPE I 01/07/2007  . Unspecified hypothyroidism 12/13/2007  . HYPERCHOLESTEROLEMIA 12/13/2007  . LEUKOPENIA, CHRONIC 12/13/2007  . Nonproliferative diabetic retinopathy NOS 12/13/2007  . GLAUCOMA 12/13/2007  . HYPERTENSION 01/07/2007  . CONSTIPATION, CHRONIC 12/13/2007  . INTERNAL HEMORRHOIDS 07/23/2008  . VARICOSE VEINS, LOWER EXTREMITIES 12/13/2007  . Hyperthyroidism   . DM nephropathy/sclerosis     Past Surgical History  Procedure Date  . Tubal ligation   . Eye surgery     bilateral  . Stress myoview  05/18/2004  . Electrocardiogram 08/24/2006    History   Social History  . Marital Status: Married    Spouse Name: N/A    Number of Children: N/A  . Years of Education: N/A   Occupational History  .      Doesn't work outside the home   Social History Main Topics  . Smoking status: Never Smoker   . Smokeless tobacco: Not on file  . Alcohol Use: No  . Drug Use: No  . Sexually Active:    Other Topics Concern  . Not on file   Social History Narrative   Pt gets regular exercise.    Current Outpatient Prescriptions on File Prior to Visit  Medication Sig Dispense Refill  . alum & mag hydroxide-simeth (MYLANTA) 200-200-20 MG/5ML suspension as needed.        Marland Kitchen amiloride-hydrochlorothiazide (MODURETIC) 5-50 MG tablet Take 0.5 tablets by mouth daily.  45 tablet  1  . Cod Liver Oil CAPS Take 1 capsule by mouth daily.        . colestipol (COLESTID) 5 G granules Take 5 g by mouth daily.  500 g  1  . Glucosamine 500 MG TABS Take 1 tablet by mouth daily.        . hydrocortisone-pramoxine (ANALPRAM-HC) 2.5-1 % rectal cream Place rectally as needed for hemorrhoids. Apply to rectum as needed  30 g  1  . insulin lispro (HUMALOG) 100 UNIT/ML injection  Inject 50 Units into the skin daily. For use in pump total 50 units total per day  40 mL  2  . levothyroxine (SYNTHROID, LEVOTHROID) 125 MCG tablet Take 1 tablet (125 mcg total) by mouth daily.  90 tablet  1  . magnesium oxide (MAG-OX) 400 MG tablet Take 2 tablets once daily       . vitamin E 400 UNIT capsule Take 2 capsules by mouth twice a day as needed         Allergies  Allergen Reactions  . Atorvastatin     REACTION: perceived myalgias  . Ciprofloxacin     REACTION: pt states med  make her sick  . Epinephrine     REACTION: thyroid problems  . Erythromycin   . Morphine   . Penicillins     REACTION: hives   Family History  Problem Relation Age of Onset  . Cancer Mother     Breast Cancer  . Diabetes Mother   . Diabetes Father    BP 138/82  Pulse 95  Temp(Src) 98.6 F (37 C) (Oral)  SpO2 95%  LMP 07/01/2011  Review of Systems She has fatigue and right earache.  No cough.      Objective:   Physical Exam VITAL SIGNS:  See vs page GENERAL: no distress head: no deformity eyes: there is bilateral periorbital swelling and proptosis external nose and ears are normal mouth: no lesion seen Tm's are slightly red    Lab Results  Component Value Date   WBC 4.2* 07/14/2011   HGB 13.2 07/14/2011   HCT 39.5 07/14/2011   PLT 293.0 07/14/2011   GLUCOSE 272* 07/14/2011   CHOL 300* 07/14/2011   TRIG 76.0 07/14/2011   HDL 82.70 07/14/2011   LDLDIRECT 198.7 07/14/2011   ALT 20 07/14/2011   AST 25 07/14/2011   NA 136 07/14/2011   K 4.8 07/14/2011   CL 100 07/14/2011   CREATININE 0.9 07/14/2011   BUN 22 07/14/2011   CO2 29 07/14/2011   TSH 2.52 07/14/2011   HGBA1C 12.2* 07/14/2011   MICROALBUR 1.8 07/14/2011      Assessment & Plan:  URI, new DM, therapy limited by noncompliance with cbg monitoring.  i'll do the best i can. Dyslipidemia, therapy is limited by multiple perceived drug intolerances.

## 2011-08-23 ENCOUNTER — Other Ambulatory Visit: Payer: Self-pay | Admitting: Endocrinology

## 2011-08-26 ENCOUNTER — Ambulatory Visit: Payer: Self-pay | Admitting: Obstetrics and Gynecology

## 2011-09-08 ENCOUNTER — Ambulatory Visit (INDEPENDENT_AMBULATORY_CARE_PROVIDER_SITE_OTHER): Payer: BC Managed Care – PPO | Admitting: Obstetrics and Gynecology

## 2011-09-08 DIAGNOSIS — Z01419 Encounter for gynecological examination (general) (routine) without abnormal findings: Secondary | ICD-10-CM

## 2011-11-26 ENCOUNTER — Other Ambulatory Visit: Payer: Self-pay | Admitting: Endocrinology

## 2012-03-10 ENCOUNTER — Other Ambulatory Visit: Payer: Self-pay | Admitting: Obstetrics and Gynecology

## 2012-03-10 ENCOUNTER — Other Ambulatory Visit: Payer: Self-pay | Admitting: Endocrinology

## 2012-03-10 DIAGNOSIS — Z1231 Encounter for screening mammogram for malignant neoplasm of breast: Secondary | ICD-10-CM

## 2012-03-16 ENCOUNTER — Other Ambulatory Visit: Payer: Self-pay | Admitting: Endocrinology

## 2012-04-12 ENCOUNTER — Ambulatory Visit
Admission: RE | Admit: 2012-04-12 | Discharge: 2012-04-12 | Disposition: A | Discharge status: Home / Self Care | Payer: BC Managed Care – PPO | Source: Ambulatory Visit | Attending: Obstetrics and Gynecology | Admitting: Obstetrics and Gynecology

## 2012-04-12 DIAGNOSIS — Z1231 Encounter for screening mammogram for malignant neoplasm of breast: Secondary | ICD-10-CM

## 2012-04-17 ENCOUNTER — Other Ambulatory Visit: Payer: Self-pay | Admitting: Endocrinology

## 2012-04-27 ENCOUNTER — Other Ambulatory Visit: Payer: Self-pay | Admitting: *Deleted

## 2012-04-27 ENCOUNTER — Other Ambulatory Visit (INDEPENDENT_AMBULATORY_CARE_PROVIDER_SITE_OTHER): Payer: BC Managed Care – PPO

## 2012-04-27 DIAGNOSIS — Z0000 Encounter for general adult medical examination without abnormal findings: Secondary | ICD-10-CM

## 2012-04-27 DIAGNOSIS — E789 Disorder of lipoprotein metabolism, unspecified: Secondary | ICD-10-CM

## 2012-04-27 LAB — LIPID PANEL
Cholesterol: 254 mg/dL — ABNORMAL HIGH (ref 0–200)
HDL: 59.2 mg/dL (ref >39.00)
Triglycerides: 105 mg/dL (ref 0.0–149.0)

## 2012-04-27 LAB — URINALYSIS, ROUTINE W REFLEX MICROSCOPIC
Bilirubin Urine: NEGATIVE
Hgb urine dipstick: NEGATIVE
Ketones, ur: NEGATIVE
Total Protein, Urine: NEGATIVE
pH: 6.5 (ref 5.0–8.0)

## 2012-04-27 LAB — CBC WITH DIFFERENTIAL/PLATELET
Basophils Relative: 0.6 % (ref 0.0–3.0)
HCT: 40 % (ref 36.0–46.0)
Hemoglobin: 12.9 g/dL (ref 12.0–15.0)
Lymphocytes Relative: 27.2 % (ref 12.0–46.0)
Lymphs Abs: 1.2 10*3/uL (ref 0.7–4.0)
Monocytes Relative: 9.3 % (ref 3.0–12.0)
Neutro Abs: 2.7 10*3/uL (ref 1.4–7.7)
RBC: 4.68 Mil/uL (ref 3.87–5.11)

## 2012-04-27 LAB — BASIC METABOLIC PANEL
BUN: 23 mg/dL (ref 6–23)
Chloride: 98 mEq/L (ref 96–112)
GFR: 71.63 mL/min (ref >60.00)
Glucose, Bld: 235 mg/dL — ABNORMAL HIGH (ref 70–99)
Potassium: 4.8 mEq/L (ref 3.5–5.1)
Sodium: 137 mEq/L (ref 135–145)

## 2012-04-27 LAB — HEPATIC FUNCTION PANEL
ALT: 22 U/L (ref 0–35)
Bilirubin, Direct: 0.1 mg/dL (ref 0.0–0.3)
Total Bilirubin: 0.5 mg/dL (ref 0.3–1.2)
Total Protein: 7.6 g/dL (ref 6.0–8.3)

## 2012-04-27 LAB — TSH: TSH: 0.9 u[IU]/mL (ref 0.35–5.50)

## 2012-04-27 LAB — LDL CHOLESTEROL, DIRECT: Direct LDL: 161 mg/dL

## 2012-05-01 ENCOUNTER — Ambulatory Visit: Payer: BC Managed Care – PPO | Admitting: Endocrinology

## 2012-05-01 ENCOUNTER — Encounter: Payer: Self-pay | Admitting: Endocrinology

## 2012-05-01 ENCOUNTER — Ambulatory Visit (INDEPENDENT_AMBULATORY_CARE_PROVIDER_SITE_OTHER): Payer: BC Managed Care – PPO | Admitting: Endocrinology

## 2012-05-01 VITALS — BP 134/82 | HR 100 | Temp 98.4°F | Wt 160.0 lb

## 2012-05-01 DIAGNOSIS — Z0000 Encounter for general adult medical examination without abnormal findings: Secondary | ICD-10-CM

## 2012-05-01 DIAGNOSIS — E109 Type 1 diabetes mellitus without complications: Secondary | ICD-10-CM

## 2012-05-01 DIAGNOSIS — R9431 Abnormal electrocardiogram [ECG] [EKG]: Secondary | ICD-10-CM | POA: Insufficient documentation

## 2012-05-01 NOTE — Patient Instructions (Addendum)
blood tests are being requested for you today.  We'll contact you with results.  pending the test results, please:  continue basal rate of 0.5 units/hr, 24 hrs per day.  continue mealtime bolus of 1 unit/ 12 grams carbohydrate.  continue correction bolus (which some people call "sensitivity," or "insulin sensitivity ratio," or just "isr") of 1 unit for each 50 by which your glucose exceeds 100. check your blood sugar 4 times a day--before the 3 meals, and at bedtime.  also check if you have symptoms of your blood sugar being too high or too low.  please keep a record of the readings and bring it to your next appointment here.  please call us sooner if you are having low blood sugar episodes.  This is the next step in the treatment of your diabetes.   Please come back for a regular physical appointment in 3 months.   please consider these measures for your health:  minimize alcohol.  do not use tobacco products.  have a colonoscopy at least every 10 years from age 57.  Women should have an annual mammogram from age 8.  keep firearms safely stored.  always use seat belts.  have working smoke alarms in your home.  see an eye doctor and dentist regularly.  never drive under the influence of alcohol or drugs (including prescription drugs).     Fat and Cholesterol Control Diet Cholesterol is a wax-like substance. It comes from your liver and is found in certain foods. There is good (HDL) and bad (LDL) cholesterol. Too much cholesterol in your blood can affect your heart. Certain foods can lower or raise your cholesterol. Eat foods that are low in cholesterol. Saturated and trans fats are bad fats found in foods that will raise your cholesterol. Do not eat foods that are high in saturated and trans fats. FOODS HIGHER IN SATURATED AND TRANS FATS  Dairy products, such as whole milk, eggs, cheese, cream, and butter.   Fatty meats, such as hot dogs, sausage, and salami.   Fried foods.   Trans fats which  are found in margarine and pre-made cookies, crackers, and baked goods.   Tropical oils, such as coconut and palm oils.  Read package labels at the store. Do not buy products that use saturated or trans fats or hydrogenated oils. Find foods labeled:  Low-fat.   Low-saturated fat.   Trans-fat-free.   Low-cholesterol.  FOODS LOWER IN CHOLESTEROL   Fruit.   Vegetables.   Beans, peas, and lentils.   Fish.   Lean meat, such as chicken (without skin) or ground [place].   Grains, such as barley, rice, couscous, bulgur wheat, and pasta.   Heart-healthy tub margarine.  PREPARING YOUR FOOD  Broil, bake, steam, or roast foods. Do not fry food.   Use non-stick cooking sprays.   Use lemon or herbs to flavor food instead of using butter or stick margarine.   Use nonfat yogurt, salsa, or low-fat dressings for salads.  Document Released: 12/07/2011 Document Reviewed: 09/07/2011 [facility] Patient Information 2013 [place], [place].

## 2012-05-01 NOTE — Progress Notes (Signed)
Subjective:    Patient ID: Katherine Navarro, female    DOB: [birth year], 56 y.o.   MRN: [id number]  HPI here for regular wellness examination.  He's feeling pretty well in general, and says chronic med probs are stable, except as noted below Past Medical History  Diagnosis Date  . DIABETES MELLITUS, TYPE I 01/07/2007  . Unspecified hypothyroidism 12/13/2007  . HYPERCHOLESTEROLEMIA 12/13/2007  . LEUKOPENIA, CHRONIC 12/13/2007  . Nonproliferative diabetic retinopathy JXB(147.82) 12/13/2007  . GLAUCOMA 12/13/2007  . HYPERTENSION 01/07/2007  . CONSTIPATION, CHRONIC 12/13/2007  . INTERNAL HEMORRHOIDS 07/23/2008  . VARICOSE VEINS, LOWER EXTREMITIES 12/13/2007  . Hyperthyroidism   . DM nephropathy/sclerosis     Past Surgical History  Procedure Date  . Tubal ligation   . Eye surgery     bilateral  . Stress myoview  05/18/2004  . Electrocardiogram 08/24/2006    History   Social History  . Marital Status: Married    Spouse Name: N/A    Number of Children: N/A  . Years of Education: N/A   Occupational History  .      Doesn't work outside the home   Social History Main Topics  . Smoking status: Never Smoker   . Smokeless tobacco: Not on file  . Alcohol Use: No  . Drug Use: No  . Sexually Active:    Other Topics Concern  . Not on file   Social History Narrative   Pt gets regular exercise.    Current Outpatient Prescriptions on File Prior to Visit  Medication Sig Dispense Refill  . alum & mag hydroxide-simeth (MYLANTA) 200-200-20 MG/5ML suspension as needed.        Marland Kitchen amiloride-hydrochlorothiazide (MODURETIC) 5-50 MG tablet TAKE ONE-HALF TABLET BY MOUTH EVERY DAY  45 tablet  1  . BAYER CONTOUR NEXT TEST test strip USE AS INSTRUCTED 10 TIMES A DAY  900 strip  1  . Cod Liver Oil CAPS Take 1 capsule by mouth daily.        . colestipol (COLESTID) 5 G granules Take 5 g by mouth daily.  500 g  1  . Glucosamine 500 MG TABS Take 1 tablet by mouth daily.        Marland Kitchen HUMALOG 100 UNIT/ML  injection INJECT 50 UNITS SUBCUTANEOUSLY EVERY DAY VIA INSULIN PUMP  10 mL  5  . hydrocortisone-pramoxine (ANALPRAM-HC) 2.5-1 % rectal cream Place rectally as needed for hemorrhoids. Apply to rectum as needed  30 g  1  . Insulin Infusion Pump Supplies (MINIMED INFUSION SET-MMT 396) MISC CHANGE EVERY 2 DAYS  50 each  3  . Insulin Infusion Pump Supplies (PARADIGM RESERVOIR ) MISC CHANGE EVERY 2 DAYS  50 each  3  . levothyroxine (SYNTHROID, LEVOTHROID) 125 MCG tablet TAKE ONE TABLET BY MOUTH EVERY DAY  90 tablet  1  . magnesium oxide (MAG-OX) 400 MG tablet Take 2 tablets once daily       . vitamin E 400 UNIT capsule Take 2 capsules by mouth twice a day as needed         Allergies  Allergen Reactions  . Atorvastatin     REACTION: perceived myalgias  . Ciprofloxacin     REACTION: pt states med  make her sick  . Epinephrine     REACTION: thyroid problems  . Erythromycin   . Morphine   . Penicillins     REACTION: hives    Family History  Problem Relation Age of Onset  . Cancer Mother  Breast Cancer  . Diabetes Mother   . Diabetes Father     BP 134/82  Pulse 100  Temp 98.4 F (36.9 C) (Oral)  Wt 160 lb (72.576 kg)  SpO2 96%     Review of Systems  Constitutional: Negative for fever.  HENT: Negative for hearing loss.   Eyes: Negative for visual disturbance.  Respiratory: Negative for cough.   Gastrointestinal: Negative for blood in stool.  Genitourinary: Negative for hematuria.  Musculoskeletal: Negative for back pain.  Skin: Negative for rash.  Hematological: Does not bruise/bleed easily.  Psychiatric/Behavioral: Negative for dysphoric mood.       Objective:   Physical Exam VS: see vs page GEN: no distress HEAD: head: no deformity eyes: no periorbital swelling, there is bilateral proptosis external nose and ears are normal mouth: no lesion seen NECK: supple, thyroid is not enlarged CHEST WALL: no deformity LUNGS:  Clear to auscultation BREASTS:  sees  gyn CV: reg rate and rhythm, no murmur ABD: abdomen is soft, nontender.  no hepatosplenomegaly.  not distended.  no hernia GENITALIA/RECTAL: sees gyn MUSCULOSKELETAL: muscle bulk and strength are grossly normal.  no obvious joint swelling.  gait is normal and steady EXTEMITIES: no deformity.  no ulcer on the feet.  feet are of normal color and temp.  no edema PULSES: dorsalis pedis intact bilat.  no carotid bruit NEURO:  cn 2-12 grossly intact.   readily moves all 4's.  sensation is intact to touch on the feet SKIN:  Normal texture and temperature.  No rash or suspicious lesion is visible.   NODES:  None palpable at the neck PSYCH: alert, oriented x3.  Does not appear anxious nor depressed.        Assessment & Plan:  Wellness visit today, with problems stable, except as noted.   SEPARATE EVALUATION FOLLOWS--EACH PROBLEM HERE IS NEW, NOT RESPONDING TO TREATMENT, OR POSES SIGNIFICANT RISK TO THE PATIENT'S HEALTH: HISTORY OF THE PRESENT ILLNESS: Pt returns for f/u of type 1 dm (dx'ed 1978; therapy has been limited by noncompliance with cbg record).  no cbg record, but states cbg's are "just crazy." Moderately abnormal ecg is noted today.  She has no pain at the chest It was normal in 2011.  No assoc sob. PAST MEDICAL HISTORY Past Medical History  Diagnosis Date  . DIABETES MELLITUS, TYPE I 01/07/2007  . Unspecified hypothyroidism 12/13/2007  . HYPERCHOLESTEROLEMIA 12/13/2007  . LEUKOPENIA, CHRONIC 12/13/2007  . Nonproliferative diabetic retinopathy ZOX(096.04) 12/13/2007  . GLAUCOMA 12/13/2007  . HYPERTENSION 01/07/2007  . CONSTIPATION, CHRONIC 12/13/2007  . INTERNAL HEMORRHOIDS 07/23/2008  . VARICOSE VEINS, LOWER EXTREMITIES 12/13/2007  . Hyperthyroidism   . DM nephropathy/sclerosis     Past Surgical History  Procedure Date  . Tubal ligation   . Eye surgery     bilateral  . Stress myoview  05/18/2004  . Electrocardiogram 08/24/2006    History   Social History  . Marital Status:  Married    Spouse Name: N/A    Number of Children: N/A  . Years of Education: N/A   Occupational History  .      Doesn't work outside the home   Social History Main Topics  . Smoking status: Never Smoker   . Smokeless tobacco: Not on file  . Alcohol Use: No  . Drug Use: No  . Sexually Active:    Other Topics Concern  . Not on file   Social History Narrative   Pt gets regular exercise.    Current  Outpatient Prescriptions on File Prior to Visit  Medication Sig Dispense Refill  . alum & mag hydroxide-simeth (MYLANTA) 200-200-20 MG/5ML suspension as needed.        Marland Kitchen amiloride-hydrochlorothiazide (MODURETIC) 5-50 MG tablet TAKE ONE-HALF TABLET BY MOUTH EVERY DAY  45 tablet  1  . BAYER CONTOUR NEXT TEST test strip USE AS INSTRUCTED 10 TIMES A DAY  900 strip  1  . Cod Liver Oil CAPS Take 1 capsule by mouth daily.        . colestipol (COLESTID) 5 G granules Take 5 g by mouth daily.  500 g  1  . Glucosamine 500 MG TABS Take 1 tablet by mouth daily.        Marland Kitchen HUMALOG 100 UNIT/ML injection INJECT 50 UNITS SUBCUTANEOUSLY EVERY DAY VIA INSULIN PUMP  10 mL  5  . hydrocortisone-pramoxine (ANALPRAM-HC) 2.5-1 % rectal cream Place rectally as needed for hemorrhoids. Apply to rectum as needed  30 g  1  . Insulin Infusion Pump Supplies (MINIMED INFUSION SET-MMT 396) MISC CHANGE EVERY 2 DAYS  50 each  3  . Insulin Infusion Pump Supplies (PARADIGM RESERVOIR ) MISC CHANGE EVERY 2 DAYS  50 each  3  . levothyroxine (SYNTHROID, LEVOTHROID) 125 MCG tablet TAKE ONE TABLET BY MOUTH EVERY DAY  90 tablet  1  . magnesium oxide (MAG-OX) 400 MG tablet Take 2 tablets once daily       . vitamin E 400 UNIT capsule Take 2 capsules by mouth twice a day as needed         Allergies  Allergen Reactions  . Atorvastatin     REACTION: perceived myalgias  . Ciprofloxacin     REACTION: pt states med  make her sick  . Epinephrine     REACTION: thyroid problems  . Erythromycin   . Morphine   . Penicillins      REACTION: hives    Family History  Problem Relation Age of Onset  . Cancer Mother     Breast Cancer  . Diabetes Mother   . Diabetes Father     BP 134/82  Pulse 100  Temp 98.4 F (36.9 C) (Oral)  Wt 160 lb (72.576 kg)  SpO2 96% REVIEW OF SYSTEMS: Denies weight change and numbness. PHYSICAL EXAMINATION: VITAL SIGNS:  See vs page GENERAL: no distress Pulses: dorsalis pedis intact bilat.   Feet: no deformity.  no ulcer on the feet.  feet are of normal color and temp.  no edema.  Skin of the feet is thickened and hyperpigmented Neuro: sensation is intact to touch on the feet LAB/XRAY RESULTS: i reviewed electrocardiogram Lab Results  Component Value Date   CHOL 254* 04/27/2012   CHOL 254* 04/27/2012   HDL 59.20 04/27/2012   LDLDIRECT 161.0 04/27/2012   TRIG 105.0 04/27/2012   CHOLHDL 4 04/27/2012  IMPRESSION: Abnormal ecg, new Type 1 DM, therapy limited by noncompliance.  i'll do the best i can. Dyslipidemia, therapy is limited by multiple perceived drug intolerances PLAN: See instruction page

## 2012-05-03 ENCOUNTER — Other Ambulatory Visit (HOSPITAL_COMMUNITY): Payer: BC Managed Care – PPO

## 2012-05-04 ENCOUNTER — Telehealth: Payer: Self-pay | Admitting: *Deleted

## 2012-05-04 ENCOUNTER — Ambulatory Visit (HOSPITAL_COMMUNITY): Payer: BC Managed Care – PPO | Attending: Cardiovascular Disease | Admitting: Radiology

## 2012-05-04 DIAGNOSIS — E109 Type 1 diabetes mellitus without complications: Secondary | ICD-10-CM | POA: Insufficient documentation

## 2012-05-04 DIAGNOSIS — I369 Nonrheumatic tricuspid valve disorder, unspecified: Secondary | ICD-10-CM | POA: Insufficient documentation

## 2012-05-04 DIAGNOSIS — I059 Rheumatic mitral valve disease, unspecified: Secondary | ICD-10-CM | POA: Insufficient documentation

## 2012-05-04 DIAGNOSIS — I10 Essential (primary) hypertension: Secondary | ICD-10-CM | POA: Insufficient documentation

## 2012-05-04 DIAGNOSIS — R9431 Abnormal electrocardiogram [ECG] [EKG]: Secondary | ICD-10-CM

## 2012-05-04 DIAGNOSIS — R9430 Abnormal result of cardiovascular function study, unspecified: Secondary | ICD-10-CM

## 2012-05-04 DIAGNOSIS — E785 Hyperlipidemia, unspecified: Secondary | ICD-10-CM | POA: Insufficient documentation

## 2012-05-04 NOTE — Telephone Encounter (Signed)
Patient notified of normal results of echocardiogram.

## 2012-05-04 NOTE — Progress Notes (Signed)
Echocardiogram performed.  

## 2012-05-04 NOTE — Telephone Encounter (Signed)
Message copied by Elnora Morrison on Thu May 04, 2012  4:19 PM ------      Message from: Romero Belling      Created: Thu May 04, 2012  2:31 PM       please call patient:      Normal--good

## 2012-05-11 ENCOUNTER — Other Ambulatory Visit: Payer: Self-pay | Admitting: Endocrinology

## 2012-05-11 ENCOUNTER — Other Ambulatory Visit (INDEPENDENT_AMBULATORY_CARE_PROVIDER_SITE_OTHER): Payer: BC Managed Care – PPO

## 2012-05-11 ENCOUNTER — Telehealth: Payer: Self-pay | Admitting: *Deleted

## 2012-05-11 DIAGNOSIS — E109 Type 1 diabetes mellitus without complications: Secondary | ICD-10-CM

## 2012-05-11 LAB — HEMOGLOBIN A1C: Hgb A1c MFr Bld: 12.3 % — ABNORMAL HIGH (ref 4.6–6.5)

## 2012-05-11 NOTE — Telephone Encounter (Signed)
PATIENT NOTIFIED OF HER LAB RESULTS FOR HGBA1C. INSTRUCTIONS GIVEN OF WHEN TO TEST BLOOD SUGAR AND WRITE DOWN TO BRING TO DR. ELLISON HER NEXT OFFICE VISIT. DIARY OF BLOOD SUGAR LOG HANDOUTS AND BOOK MAILED TO PATIENT TO HELP HER KEEP UP WITH HER BLOOD SUGAR READINGS.

## 2012-05-11 NOTE — Telephone Encounter (Signed)
Message copied by Elnora Morrison on Thu May 11, 2012  4:29 PM ------      Message from: Romero Belling      Created: Thu May 11, 2012  2:25 PM       please call patient:      Your blood sugar is high      The next step to get it better is to write down how the sugr is at different times of day, and bring ti to your next appointment here.

## 2012-06-02 ENCOUNTER — Other Ambulatory Visit: Payer: Self-pay | Admitting: Endocrinology

## 2012-07-21 ENCOUNTER — Other Ambulatory Visit: Payer: Self-pay

## 2012-07-21 ENCOUNTER — Telehealth: Payer: Self-pay

## 2012-07-21 MED ORDER — GLUCOSE BLOOD VI STRP: ORAL_STRIP | Status: DC

## 2012-07-21 NOTE — Telephone Encounter (Signed)
Pt calling with name of meter at Dr. George Hugh request in order to send in test strips.  It is a one touch ultra.  "blue test strips"  [employer]   She test 10 x a day and will need a 90 day supply.

## 2012-07-28 ENCOUNTER — Other Ambulatory Visit: Payer: Self-pay | Admitting: Endocrinology

## 2012-07-28 ENCOUNTER — Telehealth: Payer: Self-pay

## 2012-07-28 MED ORDER — GLUCOSE BLOOD VI STRP: ORAL_STRIP | Status: DC

## 2012-07-28 NOTE — Telephone Encounter (Signed)
MED REFILL

## 2012-07-28 NOTE — Addendum Note (Signed)
Addended by: Elnora Morrison on: 07/28/2012 04:21 PM   Modules accepted: Orders

## 2012-07-28 NOTE — Telephone Encounter (Signed)
Please call about meter supplies, pt says we need to send new script for meter strips. She wants 90 day supply. CB# [phone]

## 2012-09-14 ENCOUNTER — Other Ambulatory Visit: Payer: Self-pay | Admitting: Obstetrics and Gynecology

## 2012-09-15 LAB — PAP IG W/ RFLX HPV ASCU

## 2012-10-23 ENCOUNTER — Other Ambulatory Visit: Payer: Self-pay | Admitting: Obstetrics and Gynecology

## 2012-11-30 ENCOUNTER — Other Ambulatory Visit (HOSPITAL_COMMUNITY): Payer: Self-pay | Admitting: Obstetrics and Gynecology

## 2012-11-30 ENCOUNTER — Other Ambulatory Visit: Payer: Self-pay | Admitting: Obstetrics and Gynecology

## 2012-11-30 NOTE — Progress Notes (Signed)
Spoke to patient regarding her surgery for June 13th, 2014. Pt has insulin pump and HTN. Pt unable to come in today for pre-op visit. 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

## 2012-11-30 NOTE — H&P (Signed)
Katherine Navarro is a 57 y.o. female G0,  who presents for a loop electrosurgical excision procedure because of CIN-III .  In March of 2014 the patient was found to have on her annual PAP smear, atypical glandular cells and consequently underwent colposcopy.  Results of the colposcopic biopsies showed: CIN-II & III.  An endometrial biopsy done at that same time for menorrhagia did not reveal any atypia, hyperplasia or malignancy.  In 1978 the patient was found to have cervical dysplasia and was treated with cryotherapy with normal PAP smears yearly until this year.  Given current findings the patient was counseled on  management options  a LEEP was recommended.  The patient concurred and has been so scheduled.   Past Medical History  OB History: G0;  History of infertility  & ectopic 1978  GYN History: menarche: 57 YO    LMP: 11/09/2012;    Contracepton no method  The patient reports a past history of: herpes.;  History of abnormal PAP smear in 1978 treated with Cervical Cryotherapy;  Last PAP smear; 08/2012 Atypical Gladular Cells (see HPI)  Medical History: Hypertension, Insulin Dependent Diabetes Mellitus, Thyroid Disease, Fibroids and  Menorrhagia.  Surgical History:  2013  Eye Surgery    1984 Tubal Sterilization   1978  Salpingostomy for ectopic pregnancy Denies problems with anesthesia or history of blood transfusions  Family History: Diabetes mellitus, hypertension, stroke, breast cancer and stomach cancer.  Social History: Single and unemployed; Denies tobacco and illicit drug use but occasionally will have a glass of wine.   Outpatient Encounter Prescriptions as of 11/30/2012  Medication Sig Dispense Refill  . alum & mag hydroxide-simeth (MYLANTA) 200-200-20 MG/5ML suspension as needed.        Marland Kitchen amiloride-hydrochlorothiazide (MODURETIC) 5-50 MG tablet TAKE ONE-HALF TABLET BY MOUTH EVERY DAY  45 tablet  1  . BAYER CONTOUR NEXT TEST test strip USE AS INSTRUCTED 10 TIMES A DAY  900 strip  1   . Cod Liver Oil CAPS Take 1 capsule by mouth daily.        . colestipol (COLESTID) 5 G granules Take 5 g by mouth daily.  500 g  1  . Glucosamine 500 MG TABS Take 1 tablet by mouth daily.        Marland Kitchen glucose blood (ONE TOUCH ULTRA TEST) test strip Use as instructed 10 TIMES A DAY. PATIENT USES ONE TOUCH ULTRA BLUE TEST STRIPS.  900 each  3  . HUMALOG 100 UNIT/ML injection INJECT 50 UNITS SUBCUTANEOUSLY EVERY DAY VIA INSULIN PUMP  10 mL  5  . hydrocortisone-pramoxine (ANALPRAM-HC) 2.5-1 % rectal cream Place rectally as needed for hemorrhoids. Apply to rectum as needed  30 g  1  . Insulin Infusion Pump Supplies (MINIMED INFUSION SET-MMT 396) MISC CHANGE EVERY 2 DAYS  50 each  3  . Insulin Infusion Pump Supplies (PARADIGM RESERVOIR ) MISC CHANGE EVERY 2 DAYS  50 each  3  . levothyroxine (SYNTHROID, LEVOTHROID) 125 MCG tablet TAKE ONE TABLET BY MOUTH EVERY DAY  90 tablet  1  . levothyroxine (SYNTHROID, LEVOTHROID) 125 MCG tablet TAKE ONE TABLET BY MOUTH EVERY DAY  90 tablet  3  . magnesium oxide (MAG-OX) 400 MG tablet Take 2 tablets once daily       . vitamin E 400 UNIT capsule Take 2 capsules by mouth twice a day as needed        No facility-administered encounter medications on file as of 11/30/2012.   Vitamin D3 daily  Vitamin A daily Vitamin E daily Prednisone Eye Drops 1% daily Allergies  Allergen Reactions  . Atorvastatin     REACTION: perceived myalgias  . Ciprofloxacin     REACTION: pt states med  make her sick  . Epinephrine     REACTION: thyroid problems  . Erythromycin   . Morphine   . Penicillins     REACTION: hives    Denies sensitivity to peanuts, shellfish, soy, latex or adhesives.   ROS: Admits to reading glasses, constipation, swollen knuckles and feet;  Denies headache, vision changes, nasal congestion, dysphagia, tinnitus, dizziness, hoarseness, cough,  chest pain, shortness of breath, nausea, vomiting, diarrhea, urinary frequency, urgency  dysuria, hematuria,  vaginitis symptoms, pelvic pain, easy bruising,  myalgias, arthralgias, skin rashes, unexplained weight loss and except as is mentioned in the history of present illness, patient's review of systems is otherwise negative.    Physical Exam  Bp  122/62     P 84     R 15   T 99.1 degrees F orally      Weight 162lbs         Height  5'4"        BMI  27.8  Neck: supple without masses or thyromegaly Lungs: clear to auscultation Heart: regular rate and rhythm Abdomen: soft, non-tender and no organomegaly Pelvic:EGBUS- wnl; vagina-normal rugae; uterus-normal size, cervix without lesions or motion tenderness; adnexae-no tenderness or masses Extremities:  no clubbing or cyanosis, mild edema of PIP finger joints and feet -non-pitting   Assesment:  CIN-III   Disposition:  A discussion was held with patient regarding the indication for her procedure(s) along with the risks, which include but are not limited to: reaction to anesthesia, damage to adjacent organs, infection, cervical scarring  and excessive bleeding. The patient verbalized understanding of these risks and has consented to proceed with a Loop Electrosurgical Excision Procedure at [facility] [place] on December 01, 2012 at 1:30 p.m.   CSN# [id number]   Sankalp Ferrell J. Lowell Guitar, PA-C  for Dr. Woodroe Mode. Su Hilt

## 2012-11-30 NOTE — Progress Notes (Signed)
Spoken to dr. Arby Barrette regarding pt unable to come in for pre-op.Marland Kitchen

## 2012-12-01 ENCOUNTER — Ambulatory Visit (HOSPITAL_COMMUNITY): Payer: BC Managed Care – PPO | Admitting: Anesthesiology

## 2012-12-01 ENCOUNTER — Encounter (HOSPITAL_COMMUNITY): Payer: Self-pay | Admitting: Anesthesiology

## 2012-12-01 ENCOUNTER — Encounter (HOSPITAL_COMMUNITY)
Admission: RE | Disposition: A | Discharge status: Home / Self Care | Payer: Self-pay | Source: Ambulatory Visit | Attending: Obstetrics and Gynecology

## 2012-12-01 ENCOUNTER — Ambulatory Visit (HOSPITAL_COMMUNITY)
Admission: RE | Admit: 2012-12-01 | Discharge: 2012-12-01 | Disposition: A | Discharge status: Home / Self Care | Payer: BC Managed Care – PPO | Source: Ambulatory Visit | Attending: Obstetrics and Gynecology | Admitting: Obstetrics and Gynecology

## 2012-12-01 DIAGNOSIS — E119 Type 2 diabetes mellitus without complications: Secondary | ICD-10-CM | POA: Insufficient documentation

## 2012-12-01 DIAGNOSIS — I10 Essential (primary) hypertension: Secondary | ICD-10-CM | POA: Insufficient documentation

## 2012-12-01 DIAGNOSIS — D069 Carcinoma in situ of cervix, unspecified: Secondary | ICD-10-CM | POA: Insufficient documentation

## 2012-12-01 DIAGNOSIS — N920 Excessive and frequent menstruation with regular cycle: Secondary | ICD-10-CM | POA: Insufficient documentation

## 2012-12-01 DIAGNOSIS — E079 Disorder of thyroid, unspecified: Secondary | ICD-10-CM | POA: Insufficient documentation

## 2012-12-01 DIAGNOSIS — D259 Leiomyoma of uterus, unspecified: Secondary | ICD-10-CM | POA: Insufficient documentation

## 2012-12-01 DIAGNOSIS — Z794 Long term (current) use of insulin: Secondary | ICD-10-CM | POA: Insufficient documentation

## 2012-12-01 HISTORY — PX: LEEP: SHX91

## 2012-12-01 LAB — CBC
MCH: 27.9 pg (ref 26.0–34.0)
Platelets: 319 10*3/uL (ref 150–400)
RBC: 4.91 MIL/uL (ref 3.87–5.11)
RDW: 13.8 % (ref 11.5–15.5)

## 2012-12-01 LAB — GLUCOSE, CAPILLARY: Glucose-Capillary: 290 mg/dL — ABNORMAL HIGH (ref 70–99)

## 2012-12-01 SURGERY — LEEP (LOOP ELECTROSURGICAL EXCISION PROCEDURE)
Anesthesia: Monitor Anesthesia Care | Site: Vagina | Wound class: Clean Contaminated

## 2012-12-01 MED ORDER — VASOPRESSIN 20 UNIT/ML IJ SOLN: INTRAMUSCULAR | Status: DC | PRN

## 2012-12-01 MED ORDER — VASOPRESSIN 20 UNIT/ML IJ SOLN
INTRAMUSCULAR | Status: AC
  Filled 2012-12-01: qty 1

## 2012-12-01 MED ORDER — ONDANSETRON HCL 4 MG/2ML IJ SOLN
INTRAMUSCULAR | Status: AC
  Filled 2012-12-01: qty 2

## 2012-12-01 MED ORDER — IBUPROFEN 600 MG PO TABS: 600.0000 mg | ORAL_TABLET | Freq: Four times a day (QID) | ORAL | Status: DC | PRN

## 2012-12-01 MED ORDER — IODINE STRONG (LUGOLS) 5 % PO SOLN
ORAL | Status: DC | PRN
  Administered 2012-12-01: 8 mL via ORAL

## 2012-12-01 MED ORDER — PROPOFOL INFUSION 10 MG/ML OPTIME
INTRAVENOUS | Status: DC | PRN
  Administered 2012-12-01: 100 ug/kg/min via INTRAVENOUS

## 2012-12-01 MED ORDER — SODIUM CHLORIDE BACTERIOSTATIC 0.9 % IJ SOLN: INTRAMUSCULAR | Status: DC | PRN

## 2012-12-01 MED ORDER — ACETIC ACID 4% SOLUTION
Status: DC | PRN
  Administered 2012-12-01: 1 via TOPICAL

## 2012-12-01 MED ORDER — LACTATED RINGERS IV SOLN
INTRAVENOUS | Status: DC
  Administered 2012-12-01 (×2): via INTRAVENOUS

## 2012-12-01 MED ORDER — LIDOCAINE HCL 1 % IJ SOLN
INTRAMUSCULAR | Status: DC | PRN
  Administered 2012-12-01: 10 mL

## 2012-12-01 MED ORDER — LIDOCAINE HCL (CARDIAC) 20 MG/ML IV SOLN
INTRAVENOUS | Status: AC
  Filled 2012-12-01: qty 5

## 2012-12-01 MED ORDER — ONDANSETRON HCL 4 MG/2ML IJ SOLN
INTRAMUSCULAR | Status: DC | PRN
  Administered 2012-12-01: 4 mg via INTRAVENOUS

## 2012-12-01 MED ORDER — KETOROLAC TROMETHAMINE 30 MG/ML IJ SOLN
INTRAMUSCULAR | Status: DC | PRN
  Administered 2012-12-01: 30 mg via INTRAVENOUS

## 2012-12-01 MED ORDER — MIDAZOLAM HCL 2 MG/2ML IJ SOLN
INTRAMUSCULAR | Status: AC
  Filled 2012-12-01: qty 2

## 2012-12-01 MED ORDER — KETOROLAC TROMETHAMINE 30 MG/ML IJ SOLN
INTRAMUSCULAR | Status: AC
  Filled 2012-12-01: qty 1

## 2012-12-01 MED ORDER — DIAZEPAM 5 MG PO TABS
10.0000 mg | ORAL_TABLET | Freq: Once | ORAL | Status: AC
  Administered 2012-12-01: 10 mg via ORAL

## 2012-12-01 MED ORDER — PROPOFOL 10 MG/ML IV EMUL
INTRAVENOUS | Status: AC
  Filled 2012-12-01: qty 20

## 2012-12-01 MED ORDER — HYDROCODONE-ACETAMINOPHEN 5-300 MG PO TABS: 1.0000 | ORAL_TABLET | Freq: Four times a day (QID) | ORAL | Status: DC | PRN

## 2012-12-01 MED ORDER — FENTANYL CITRATE 0.05 MG/ML IJ SOLN
INTRAMUSCULAR | Status: AC
  Filled 2012-12-01: qty 5

## 2012-12-01 MED ORDER — DEXAMETHASONE SODIUM PHOSPHATE 10 MG/ML IJ SOLN
INTRAMUSCULAR | Status: AC
  Filled 2012-12-01: qty 1

## 2012-12-01 SURGICAL SUPPLY — 36 items
APPLICATOR COTTON TIP 6IN STRL (MISCELLANEOUS) ×3 IMPLANT
CATH ROBINSON RED A/P 16FR (CATHETERS) ×2 IMPLANT
CLOTH BEACON ORANGE TIMEOUT ST (SAFETY) ×2 IMPLANT
COUNTER NEEDLE 1200 MAGNETIC (NEEDLE) IMPLANT
DRESSING TELFA 8X3 (GAUZE/BANDAGES/DRESSINGS) ×2 IMPLANT
ELECT BALL LEEP 5MM RED (ELECTRODE) ×1 IMPLANT
ELECT LOOP LEEP RND 15X12 GRN (CUTTING LOOP)
ELECT LOOP LEEP RND 20X12 WHT (CUTTING LOOP) ×2
ELECT LOOP LEEP SQR 10X10 ORG (CUTTING LOOP)
ELECT REM PT RETURN 9FT ADLT (ELECTROSURGICAL) ×2
ELECTRODE LOOP LP RND 15X12GRN (CUTTING LOOP) IMPLANT
ELECTRODE LOOP LP RND 20X12WHT (CUTTING LOOP) IMPLANT
ELECTRODE LOOP LP SQR 10X10ORG (CUTTING LOOP) IMPLANT
ELECTRODE REM PT RTRN 9FT ADLT (ELECTROSURGICAL) ×1 IMPLANT
EVACUATOR PREFILTER SMOKE (MISCELLANEOUS) ×2 IMPLANT
EXTENDER ELECT LOOP LEEP 10CM (CUTTING LOOP) IMPLANT
GAUZE SPONGE 4X4 16PLY XRAY LF (GAUZE/BANDAGES/DRESSINGS) IMPLANT
GLOVE BIO SURGEON STRL SZ7.5 (GLOVE) ×2 IMPLANT
GLOVE BIOGEL PI IND STRL 7.5 (GLOVE) ×1 IMPLANT
GLOVE BIOGEL PI INDICATOR 7.5 (GLOVE) ×1
GOWN STRL REIN XL XLG (GOWN DISPOSABLE) ×4 IMPLANT
HOSE NS SMOKE EVAC 7/8 X6 (MISCELLANEOUS) ×2 IMPLANT
NDL SPNL 22GX3.5 QUINCKE BK (NEEDLE) ×1 IMPLANT
NEEDLE SPNL 22GX3.5 QUINCKE BK (NEEDLE) ×2 IMPLANT
NS IRRIG 1000ML POUR BTL (IV SOLUTION) ×2 IMPLANT
PACK VAGINAL MINOR WOMEN LF (CUSTOM PROCEDURE TRAY) ×2 IMPLANT
PAD OB MATERNITY 4.3X12.25 (PERSONAL CARE ITEMS) ×2 IMPLANT
PENCIL BUTTON HOLSTER BLD 10FT (ELECTRODE) ×2 IMPLANT
REDUCER FITTING SMOKE EVAC (MISCELLANEOUS) ×2 IMPLANT
SCOPETTES 8  STERILE (MISCELLANEOUS) ×2
SCOPETTES 8 STERILE (MISCELLANEOUS) ×2 IMPLANT
SPONGE SURGIFOAM ABS GEL 12-7 (HEMOSTASIS) IMPLANT
SYR CONTROL 10ML LL (SYRINGE) ×2 IMPLANT
TOWEL OR 17X24 6PK STRL BLUE (TOWEL DISPOSABLE) ×4 IMPLANT
TUBING SMOKE EVAC HOSE ADAPTER (MISCELLANEOUS) ×2 IMPLANT
WATER STERILE IRR 1000ML POUR (IV SOLUTION) ×2 IMPLANT

## 2012-12-01 NOTE — Op Note (Signed)
Preop Diagnosis: CIN 2 and 3, CPT 57522 - 1 hr   Postop Diagnosis: CIN 2 and 3   Procedure: LOOP ELECTROSURGICAL EXCISION PROCEDURE (LEEP)   Anesthesia: Monitor Anesthesia Care   Anesthesiologist: Dr. Arby Barrette  Attending: Purcell Nails, MD   Assistant: N/a  Findings: colpo biopsies CIN 2 and 3 from 9:00 to 12:00  Pathology: Cervical LEEP specimen  Fluids: 900cc  UOP: 100cc straight cath  EBL: Minimal  Complications: None   Procedure: The patient was taken to the operating room after the risks, benefits and alternatives were discussed with the patient. The patient verbalized understanding and consent signed and witnessed. The patient was placed under general anesthesia per anesthesiologist and prepped and draped in the normal sterile fashion.  A TIME OUT was performed per protocol.  A weighted speculum was placed in the patient's vagina and the anterior lip of the cervix was grasped with a single tooth tenaculum.  LEEP was performed without difficulty and the bed of the cervix was cauterized with the ball tip cautery.  The bed of the cervix continued to bleed.  Dilute pitressin was injected and stitches placed at 3 and 9 O'clock, monsels solution and gelfoam applied.  Hemostasis was achieved.  A stitch was placed at 12:00 (short suture) although true orientation was questionable and 6:00 (long suture) specimen sent to pathology.  All instruments were removed. Sponge lap and needle count was correct. Patient tolerated the procedure well and was awaiting transfer to the recovery room in good condition.

## 2012-12-01 NOTE — Transfer of Care (Signed)
Immediate Anesthesia Transfer of Care Note  Patient: Katherine Navarro  Procedure(s) Performed: Procedure(s): LOOP ELECTROSURGICAL EXCISION PROCEDURE (LEEP) (N/A)  Patient Location: PACU  Anesthesia Type:MAC  Level of Consciousness: awake, alert , oriented and patient cooperative  Airway & Oxygen Therapy: Patient Spontanous Breathing on room air  Post-op Assessment: Report given to PACU RN and Post -op Vital signs reviewed and stable  Post vital signs: stable  Complications: No apparent anesthesia complications

## 2012-12-01 NOTE — Anesthesia Preprocedure Evaluation (Addendum)
Anesthesia Evaluation  Patient identified by MRN, date of birth, ID band Patient awake    Reviewed: Allergy & Precautions, H&P , NPO status , Patient's Chart, lab work & pertinent test results, reviewed documented beta blocker date and time   Airway Mallampati: II TM Distance: >3 FB Neck ROM: full    Dental no notable dental hx. (+) Teeth Intact   Pulmonary neg pulmonary ROS,  breath sounds clear to auscultation        Cardiovascular hypertension, Pt. on medications     Neuro/Psych negative neurological ROS  negative psych ROS   GI/Hepatic negative GI ROS, Neg liver ROS,   Endo/Other    Renal/GU   negative genitourinary   Musculoskeletal negative musculoskeletal ROS (+)   Abdominal Normal abdominal exam  (+)   Peds negative pediatric ROS (+)  Hematology negative hematology ROS (+)   Anesthesia Other Findings   Reproductive/Obstetrics negative OB ROS                          Anesthesia Physical Anesthesia Plan  ASA: III  Anesthesia Plan: MAC   Post-op Pain Management:    Induction:   Airway Management Planned:   Additional Equipment:   Intra-op Plan:   Post-operative Plan:   Informed Consent: I have reviewed the patients History and Physical, chart, labs and discussed the procedure including the risks, benefits and alternatives for the proposed anesthesia with the patient or authorized representative who has indicated his/her understanding and acceptance.     Plan Discussed with: CRNA and Surgeon  Anesthesia Plan Comments:         Anesthesia Quick Evaluation

## 2012-12-01 NOTE — Anesthesia Postprocedure Evaluation (Signed)
  Anesthesia Post Note  Patient: Katherine Navarro  Procedure(s) Performed: Procedure(s) (LRB): LOOP ELECTROSURGICAL EXCISION PROCEDURE (LEEP) (N/A)  Anesthesia type: MAC  Patient location: PACU  Post pain: Pain level controlled  Post assessment: Post-op Vital signs reviewed  Last Vitals:  Filed Vitals:   12/01/12 1052  BP: 140/74  Pulse: 90  Temp: 36.8 C  Resp: 18    Post vital signs: Reviewed  Level of consciousness: sedated  Complications: No apparent anesthesia complications

## 2012-12-01 NOTE — H&P (View-Only) (Signed)
Katherine Navarro is a 57 y.o. female G0,  who presents for a loop electrosurgical excision procedure because of CIN-III .  In March of 2014 the patient was found to have on her annual PAP smear, atypical glandular cells and consequently underwent colposcopy.  Results of the colposcopic biopsies showed: CIN-II & III.  An endometrial biopsy done at that same time for menorrhagia did not reveal any atypia, hyperplasia or malignancy.  In 1978 the patient was found to have cervical dysplasia and was treated with cryotherapy with normal PAP smears yearly until this year.  Given current findings the patient was counseled on  management options  a LEEP was recommended.  The patient concurred and has been so scheduled.   Past Medical History  OB History: G0;  History of infertility  & ectopic 1978  GYN History: menarche: 57 YO    LMP: 11/09/2012;    Contracepton no method  The patient reports a past history of: herpes.;  History of abnormal PAP smear in 1978 treated with Cervical Cryotherapy;  Last PAP smear; 08/2012 Atypical Gladular Cells (see HPI)  Medical History: Hypertension, Insulin Dependent Diabetes Mellitus, Thyroid Disease, Fibroids and  Menorrhagia.  Surgical History:  2013  Eye Surgery    1984 Tubal Sterilization   1978  Salpingostomy for ectopic pregnancy Denies problems with anesthesia or history of blood transfusions  Family History: Diabetes mellitus, hypertension, stroke, breast cancer and stomach cancer.  Social History: Single and unemployed; Denies tobacco and illicit drug use but occasionally will have a glass of wine.   Outpatient Encounter Prescriptions as of 11/30/2012  Medication Sig Dispense Refill  . alum & mag hydroxide-simeth (MYLANTA) 200-200-20 MG/5ML suspension as needed.        . amiloride-hydrochlorothiazide (MODURETIC) 5-50 MG tablet TAKE ONE-HALF TABLET BY MOUTH EVERY DAY  45 tablet  1  . BAYER CONTOUR NEXT TEST test strip USE AS INSTRUCTED 10 TIMES A DAY  900 strip  1   . Cod Liver Oil CAPS Take 1 capsule by mouth daily.        . colestipol (COLESTID) 5 G granules Take 5 g by mouth daily.  500 g  1  . Glucosamine 500 MG TABS Take 1 tablet by mouth daily.        . glucose blood (ONE TOUCH ULTRA TEST) test strip Use as instructed 10 TIMES A DAY. PATIENT USES ONE TOUCH ULTRA BLUE TEST STRIPS.  900 each  3  . HUMALOG 100 UNIT/ML injection INJECT 50 UNITS SUBCUTANEOUSLY EVERY DAY VIA INSULIN PUMP  10 mL  5  . hydrocortisone-pramoxine (ANALPRAM-HC) 2.5-1 % rectal cream Place rectally as needed for hemorrhoids. Apply to rectum as needed  30 g  1  . Insulin Infusion Pump Supplies (MINIMED INFUSION SET-MMT 396) MISC CHANGE EVERY 2 DAYS  50 each  3  . Insulin Infusion Pump Supplies (PARADIGM RESERVOIR 3ML) MISC CHANGE EVERY 2 DAYS  50 each  3  . levothyroxine (SYNTHROID, LEVOTHROID) 125 MCG tablet TAKE ONE TABLET BY MOUTH EVERY DAY  90 tablet  1  . levothyroxine (SYNTHROID, LEVOTHROID) 125 MCG tablet TAKE ONE TABLET BY MOUTH EVERY DAY  90 tablet  3  . magnesium oxide (MAG-OX) 400 MG tablet Take 2 tablets once daily       . vitamin E 400 UNIT capsule Take 2 capsules by mouth twice a day as needed        No facility-administered encounter medications on file as of 11/30/2012.   Vitamin D3 daily   Vitamin A daily Vitamin E daily Prednisone Eye Drops 1% daily Allergies  Allergen Reactions  . Atorvastatin     REACTION: perceived myalgias  . Ciprofloxacin     REACTION: pt states med  make her sick  . Epinephrine     REACTION: thyroid problems  . Erythromycin   . Morphine   . Penicillins     REACTION: hives    Denies sensitivity to peanuts, shellfish, soy, latex or adhesives.   ROS: Admits to reading glasses, constipation, swollen knuckles and feet;  Denies headache, vision changes, nasal congestion, dysphagia, tinnitus, dizziness, hoarseness, cough,  chest pain, shortness of breath, nausea, vomiting, diarrhea, urinary frequency, urgency  dysuria, hematuria,  vaginitis symptoms, pelvic pain, easy bruising,  myalgias, arthralgias, skin rashes, unexplained weight loss and except as is mentioned in the history of present illness, patient's review of systems is otherwise negative.    Physical Exam  Bp  122/62     P 84     R 15   T 99.1 degrees F orally      Weight 162lbs         Height  5'4"        BMI  27.8  Neck: supple without masses or thyromegaly Lungs: clear to auscultation Heart: regular rate and rhythm Abdomen: soft, non-tender and no organomegaly Pelvic:EGBUS- wnl; vagina-normal rugae; uterus-normal size, cervix without lesions or motion tenderness; adnexae-no tenderness or masses Extremities:  no clubbing or cyanosis, mild edema of PIP finger joints and feet -non-pitting   Assesment:  CIN-III   Disposition:  A discussion was held with patient regarding the indication for her procedure(s) along with the risks, which include but are not limited to: reaction to anesthesia, damage to adjacent organs, infection, cervical scarring  and excessive bleeding. The patient verbalized understanding of these risks and has consented to proceed with a Loop Electrosurgical Excision Procedure at [facility] on December 01, 2012 at 1:30 p.m.   CSN# [id number]   Tran Randle J. Ronda Rajkumar, PA-C  for Dr. Angela Y. Roberts   

## 2012-12-01 NOTE — Interval H&P Note (Signed)
History and Physical Interval Note:  12/01/2012 10:49 AM  Katherine Navarro  has presented today for surgery, with the diagnosis of CIN 2 and 3, CPT [id number] - 1 hr  The various methods of treatment have been discussed with the patient and family. After consideration of risks, benefits and other options for treatment, the patient has consented to  Procedure(s): LOOP ELECTROSURGICAL EXCISION PROCEDURE (LEEP) (N/A) as a surgical intervention .  The patient's history has been reviewed, patient examined, no change in status, stable for surgery.  I have reviewed the patient's chart and labs.  Questions were answered to the patient's satisfaction.     Purcell Nails

## 2012-12-04 ENCOUNTER — Encounter (HOSPITAL_COMMUNITY): Payer: Self-pay | Admitting: Obstetrics and Gynecology

## 2013-02-16 ENCOUNTER — Other Ambulatory Visit: Payer: Self-pay | Admitting: Endocrinology

## 2013-02-20 ENCOUNTER — Other Ambulatory Visit: Payer: Self-pay | Admitting: *Deleted

## 2013-02-20 MED ORDER — "PARADIGM QUICK-SET 43"" 9MM MISC": Status: DC

## 2013-02-20 MED ORDER — PARADIGM PUMP RESERVOIR 3ML MISC: Status: DC

## 2013-03-06 ENCOUNTER — Other Ambulatory Visit: Payer: Self-pay

## 2013-03-06 DIAGNOSIS — Z1231 Encounter for screening mammogram for malignant neoplasm of breast: Secondary | ICD-10-CM

## 2013-03-21 ENCOUNTER — Other Ambulatory Visit: Payer: Self-pay | Admitting: Endocrinology

## 2013-04-04 ENCOUNTER — Telehealth: Payer: Self-pay | Admitting: Endocrinology

## 2013-04-04 MED ORDER — INSULIN LISPRO 100 UNIT/ML ~~LOC~~ SOLN: SUBCUTANEOUS | Status: DC

## 2013-04-04 MED ORDER — GLUCOSE BLOOD VI STRP: ORAL_STRIP | Status: DC

## 2013-04-04 NOTE — Telephone Encounter (Signed)
Pt advised [facility]

## 2013-04-11 ENCOUNTER — Other Ambulatory Visit: Payer: Self-pay

## 2013-04-11 MED ORDER — INSULIN LISPRO 100 UNIT/ML ~~LOC~~ SOLN: SUBCUTANEOUS | Status: DC

## 2013-04-11 MED ORDER — GLUCOSE BLOOD VI STRP: ORAL_STRIP | Status: DC

## 2013-04-11 NOTE — Telephone Encounter (Signed)
See previous message, pt says we have ordered the incorrect amount. Please call pt / Sherri

## 2013-04-11 NOTE — Telephone Encounter (Signed)
Pt advised and [facility]

## 2013-04-12 ENCOUNTER — Other Ambulatory Visit: Payer: Self-pay

## 2013-04-13 ENCOUNTER — Ambulatory Visit
Admission: RE | Admit: 2013-04-13 | Discharge: 2013-04-13 | Disposition: A | Discharge status: Home / Self Care | Payer: BC Managed Care – PPO | Source: Ambulatory Visit

## 2013-04-13 DIAGNOSIS — Z1231 Encounter for screening mammogram for malignant neoplasm of breast: Secondary | ICD-10-CM

## 2013-04-20 ENCOUNTER — Other Ambulatory Visit: Payer: Self-pay | Admitting: *Deleted

## 2013-04-20 MED ORDER — GLUCOSE BLOOD VI STRP: ORAL_STRIP | Status: DC

## 2013-04-20 NOTE — Telephone Encounter (Signed)
Rx was sent in wrong. Re-sent test strips.

## 2013-05-14 ENCOUNTER — Encounter: Payer: BC Managed Care – PPO | Attending: Endocrinology | Admitting: Nutrition

## 2013-05-14 ENCOUNTER — Encounter: Payer: Self-pay | Admitting: Endocrinology

## 2013-05-14 ENCOUNTER — Ambulatory Visit (INDEPENDENT_AMBULATORY_CARE_PROVIDER_SITE_OTHER): Payer: BC Managed Care – PPO | Admitting: Endocrinology

## 2013-05-14 VITALS — BP 122/68 | HR 100 | Temp 98.3°F | Resp 16 | Ht 64.0 in | Wt 162.7 lb

## 2013-05-14 DIAGNOSIS — R8281 Pyuria: Secondary | ICD-10-CM | POA: Insufficient documentation

## 2013-05-14 DIAGNOSIS — I10 Essential (primary) hypertension: Secondary | ICD-10-CM

## 2013-05-14 DIAGNOSIS — E039 Hypothyroidism, unspecified: Secondary | ICD-10-CM

## 2013-05-14 DIAGNOSIS — Z79899 Other long term (current) drug therapy: Secondary | ICD-10-CM

## 2013-05-14 DIAGNOSIS — E1065 Type 1 diabetes mellitus with hyperglycemia: Secondary | ICD-10-CM

## 2013-05-14 DIAGNOSIS — E7800 Pure hypercholesterolemia, unspecified: Secondary | ICD-10-CM

## 2013-05-14 DIAGNOSIS — E109 Type 1 diabetes mellitus without complications: Secondary | ICD-10-CM | POA: Insufficient documentation

## 2013-05-14 DIAGNOSIS — D72819 Decreased white blood cell count, unspecified: Secondary | ICD-10-CM

## 2013-05-14 DIAGNOSIS — M2550 Pain in unspecified joint: Secondary | ICD-10-CM | POA: Insufficient documentation

## 2013-05-14 DIAGNOSIS — Z713 Dietary counseling and surveillance: Secondary | ICD-10-CM | POA: Insufficient documentation

## 2013-05-14 DIAGNOSIS — R82998 Other abnormal findings in urine: Secondary | ICD-10-CM

## 2013-05-14 LAB — HEPATIC FUNCTION PANEL
AST: 28 U/L (ref 0–37)
Albumin: 4.1 g/dL (ref 3.5–5.2)
Alkaline Phosphatase: 66 U/L (ref 39–117)
Bilirubin, Direct: 0 mg/dL (ref 0.0–0.3)
Total Bilirubin: 0.4 mg/dL (ref 0.3–1.2)
Total Protein: 7.7 g/dL (ref 6.0–8.3)

## 2013-05-14 LAB — CBC WITH DIFFERENTIAL/PLATELET
Basophils Absolute: 0 10*3/uL (ref 0.0–0.1)
Eosinophils Absolute: 0.1 10*3/uL (ref 0.0–0.7)
Eosinophils Relative: 1.2 % (ref 0.0–5.0)
HCT: 38.5 % (ref 36.0–46.0)
Hemoglobin: 12.4 g/dL (ref 12.0–15.0)
Lymphocytes Relative: 18.5 % (ref 12.0–46.0)
Lymphs Abs: 1 10*3/uL (ref 0.7–4.0)
MCHC: 32.4 g/dL (ref 30.0–36.0)
MCV: 85.1 fl (ref 78.0–100.0)
Monocytes Absolute: 0.2 10*3/uL (ref 0.1–1.0)
Neutro Abs: 4.1 10*3/uL (ref 1.4–7.7)
Platelets: 248 10*3/uL (ref 150.0–400.0)
RBC: 4.52 Mil/uL (ref 3.87–5.11)
RDW: 13.9 % (ref 11.5–14.6)

## 2013-05-14 LAB — URINALYSIS, ROUTINE W REFLEX MICROSCOPIC
Bilirubin Urine: NEGATIVE
Leukocytes, UA: NEGATIVE
Nitrite: POSITIVE
Specific Gravity, Urine: 1.015
Total Protein, Urine: NEGATIVE
Urine Glucose: >=1000
Urobilinogen, UA: 0.2
pH: 6 (ref 5.0–8.0)

## 2013-05-14 LAB — LIPID PANEL
Cholesterol: 315 mg/dL — ABNORMAL HIGH (ref 0–200)
HDL: 82.3 mg/dL
Total CHOL/HDL Ratio: 4
Triglycerides: 85 mg/dL (ref 0.0–149.0)
VLDL: 17 mg/dL (ref 0.0–40.0)

## 2013-05-14 LAB — TSH: TSH: 13.65 u[IU]/mL — ABNORMAL HIGH (ref 0.35–5.50)

## 2013-05-14 LAB — LDL CHOLESTEROL, DIRECT: Direct LDL: 210.4 mg/dL

## 2013-05-14 LAB — HEMOGLOBIN A1C: Hgb A1c MFr Bld: 12.9 % — ABNORMAL HIGH (ref 4.6–6.5)

## 2013-05-14 LAB — SEDIMENTATION RATE: Sed Rate: 23 mm/h — ABNORMAL HIGH (ref 0–22)

## 2013-05-14 LAB — MICROALBUMIN / CREATININE URINE RATIO
Creatinine,U: 53.1 mg/dL
Microalb, Ur: 1.7 mg/dL (ref 0.0–1.9)

## 2013-05-14 LAB — EKG 12-LEAD

## 2013-05-14 MED ORDER — "PARADIGM QUICK-SET 43"" 9MM MISC": Status: DC

## 2013-05-14 MED ORDER — INSULIN LISPRO 100 UNIT/ML ~~LOC~~ SOLN: SUBCUTANEOUS | Status: DC

## 2013-05-14 MED ORDER — PARADIGM PUMP RESERVOIR 3ML MISC: Status: DC

## 2013-05-14 MED ORDER — AMILORIDE-HYDROCHLOROTHIAZIDE 5-50 MG PO TABS: ORAL_TABLET | ORAL | Status: DC

## 2013-05-14 NOTE — Patient Instructions (Addendum)
blood tests are being requested for you today.  We'll contact you with results.  pending the test results, please:  Take a basal rate of 0.5 units/hr, 24 hrs per day.  Please increase mealtime bolus of 1 unit/ 10 grams carbohydrate.  continue correction bolus (which some people call "sensitivity," or "insulin sensitivity ratio," or just "isr") of 1 unit for each 50 by which your glucose exceeds 100. check your blood sugar 4 times a day--before the 3 meals, and at bedtime.  also check if you have symptoms of your blood sugar being too high or too low.  please keep a record of the readings and bring it to your next appointment here.  please call us sooner if you are having low blood sugar episodes.  This is the next step in the treatment of your diabetes.   Please come back for a follow-up appointment in 3 months.  Call if you want an x-ray, or to see a specialist about your finger.   please consider these measures for your health:  minimize alcohol.  do not use tobacco products.  have a colonoscopy at least every 10 years from age 74.  Women should have an annual mammogram from age 33.  keep firearms safely stored.  always use seat belts.  have working smoke alarms in your home.  see an eye doctor and dentist regularly.  never drive under the influence of alcohol or drugs (including prescription drugs).

## 2013-05-14 NOTE — Progress Notes (Signed)
Subjective:    Patient ID: Katherine Navarro, female    DOB: 02/12/1956, 57 y.o.   MRN: [id number]  HPI Pt is here for regular wellness examination, and is feeling pretty well in general, and says chronic med probs are stable, except as noted below Past Medical History  Diagnosis Date  . DIABETES MELLITUS, TYPE I 01/07/2007  . Unspecified hypothyroidism 12/13/2007  . HYPERCHOLESTEROLEMIA 12/13/2007  . LEUKOPENIA, CHRONIC 12/13/2007  . Nonproliferative diabetic retinopathy BMW(413.24) 12/13/2007  . GLAUCOMA 12/13/2007  . HYPERTENSION 01/07/2007  . CONSTIPATION, CHRONIC 12/13/2007  . INTERNAL HEMORRHOIDS 07/23/2008  . VARICOSE VEINS, LOWER EXTREMITIES 12/13/2007  . Hyperthyroidism   . DM nephropathy/sclerosis     Past Surgical History  Procedure Laterality Date  . Tubal ligation    . Eye surgery      bilateral  . Stress myoview   05/18/2004  . Electrocardiogram  08/24/2006  . Leep N/A 12/01/2012    Procedure: LOOP ELECTROSURGICAL EXCISION PROCEDURE (LEEP);  Surgeon: Purcell Nails, MD;  Location: [facility];  Service: Gynecology;  Laterality: N/A;    History   Social History  . Marital Status: Married    Spouse Name: N/A    Number of Children: N/A  . Years of Education: N/A   Occupational History  .      Doesn't work outside the home   Social History Main Topics  . Smoking status: Never Smoker   . Smokeless tobacco: Not on file  . Alcohol Use: No  . Drug Use: No  . Sexual Activity:    Other Topics Concern  . Not on file   Social History Narrative   Pt gets regular exercise.          Current Outpatient Prescriptions on File Prior to Visit  Medication Sig Dispense Refill  . alum & mag hydroxide-simeth (MYLANTA) 200-200-20 MG/5ML suspension as needed.        Marland Kitchen BAYER CONTOUR NEXT TEST test strip USE AS INSTRUCTED 10 TIMES A DAY  900 strip  1  . Cod Liver Oil CAPS Take 1 capsule by mouth daily.        . colestipol (COLESTID) 5 G granules Take 5 g by mouth daily.  500 g  1   . Glucosamine 500 MG TABS Take 1 tablet by mouth daily.        Marland Kitchen glucose blood (ONE TOUCH ULTRA TEST) test strip Use as instructed 10 TIMES A DAY.  900 each  2  . hydrocortisone-pramoxine (ANALPRAM-HC) 2.5-1 % rectal cream Place rectally as needed for hemorrhoids. Apply to rectum as needed  30 g  1  . ibuprofen (ADVIL,MOTRIN) 600 MG tablet Take 1 tablet (600 mg total) by mouth every 6 (six) hours as needed for pain.  30 tablet  0  . levothyroxine (SYNTHROID, LEVOTHROID) 125 MCG tablet TAKE ONE TABLET BY MOUTH EVERY DAY  90 tablet  3  . magnesium oxide (MAG-OX) 400 MG tablet Take 2 tablets once daily       . vitamin E 400 UNIT capsule Take 2 capsules by mouth twice a day as needed        No current facility-administered medications on file prior to visit.    Allergies  Allergen Reactions  . Atorvastatin     REACTION: perceived myalgias  . Ciprofloxacin     REACTION: pt states med  make her sick  . Epinephrine     REACTION: thyroid problems  . Erythromycin   . Morphine   .  Penicillins     REACTION: hives    Family History  Problem Relation Age of Onset  . Cancer Mother     Breast Cancer  . Diabetes Mother   . Diabetes Father     BP 122/68  Pulse 100  Temp(Src) 98.3 F (36.8 C) (Oral)  Resp 16  Ht 5\' 4"  (1.626 m)  Wt 162 lb 11.2 oz (73.8 kg)  BMI 27.91 kg/m2  LMP 05/03/2013  Review of Systems  Constitutional: Negative for fever.  HENT: Negative for hearing loss.   Eyes: Negative for visual disturbance.  Respiratory: Negative for shortness of breath.   Cardiovascular: Negative for chest pain.  Gastrointestinal: Negative for anal bleeding.  Endocrine: Positive for cold intolerance.  Genitourinary: Negative for hematuria.  Musculoskeletal: Negative for back pain.  Skin: Negative for rash.  Allergic/Immunologic: Negative for environmental allergies.  Neurological: Negative for syncope and numbness.  Hematological: Does not bruise/bleed easily.   Psychiatric/Behavioral: Negative for dysphoric mood.       Objective:   Physical Exam VS: see vs page GEN: no distress HEAD: head: no deformity eyes: no periorbital swelling, no proptosis external nose and ears are normal mouth: no lesion seen NECK: supple, thyroid is not enlarged CHEST WALL: no deformity LUNGS:  Clear to auscultation BREASTS: sees gyn CV: reg rate and rhythm, no murmur ABD: abdomen is soft, nontender.  no hepatosplenomegaly.  not distended.  no hernia GENITALIA/RECTAL: sees gyn MUSCULOSKELETAL: muscle bulk and strength are grossly normal.  no obvious joint swelling.  gait is normal and steady PULSES:  no carotid bruit NEURO:  cn 2-12 grossly intact.   readily moves all 4's.   SKIN:  Normal texture and temperature.  No rash or suspicious lesion is visible.   NODES:  None palpable at the neck PSYCH: alert, oriented x3.  Does not appear anxious nor depressed.          Assessment & Plan:  Wellness visit today, with problems stable, except as noted.      SEPARATE EVALUATION FOLLOWS--EACH PROBLEM HERE IS NEW, NOT RESPONDING TO TREATMENT, OR POSES SIGNIFICANT RISK TO THE PATIENT'S HEALTH: HISTORY OF THE PRESENT ILLNESS: Pt returns for f/u of type 1 dm (dx'ed 1978, when she presented with vaginitis; she has been on insulin since dx; she has been on pump rx since approx 2005; she has mild neuropathy of the lower extremities, and associated retinopathy; therapy has been limited by noncompliance with cbg recording and f/u ov's; she has had only had severe hypoglycemia once (1988); she has never had DKA).  no cbg record, but states cbg's vary widely (100-400).  It is in general higher as the day goes on.  She averages 45 units total per day, via her pump.   continue basal rate of 0.5 units/hr, except 0.7/hr, from noon-2 AM.   continue mealtime bolus of 1 unit/ 12 grams carbohydrate.   continue correction bolus (which some people call "sensitivity," or "insulin  sensitivity ratio," or just "isr") of 1 unit for each 50 by which your glucose exceeds 100.  PAST MEDICAL HISTORY reviewed and up to date today REVIEW OF SYSTEMS: denies hypoglycemia.  She has weight gain.  She has pain at the right middle finger PHYSICAL EXAMINATION: VITAL SIGNS:  See vs page GENERAL: no distress Right middle finger: swelling at the PIP joint, but not tender.  ROM is limited by swelling.   LAB/XRAY RESULTS: Lab Results  Component Value Date   HGBA1C 12.9* 05/14/2013  IMPRESSION: Finger pain,  new uncertain etiology DM, therapy limited by noncompliance.  i'll do the best i can.  Pt says her visit with our CDE today helped.   PLAN: See instruction page

## 2013-05-15 ENCOUNTER — Ambulatory Visit: Payer: BC Managed Care – PPO

## 2013-05-15 DIAGNOSIS — R82998 Other abnormal findings in urine: Secondary | ICD-10-CM

## 2013-05-15 LAB — ANA: Anti Nuclear Antibody(ANA): NEGATIVE

## 2013-05-15 LAB — RHEUMATOID FACTOR: Rhuematoid fact SerPl-aCnc: <10 IU/mL (ref <=14)

## 2013-05-18 LAB — URINE CULTURE: Colony Count: >=100000

## 2013-05-21 ENCOUNTER — Encounter: Payer: Self-pay | Admitting: *Deleted

## 2013-05-21 NOTE — Progress Notes (Signed)
I reviewed with Katherine Navarro how she gives her insulin.  She is not carb counting, but takes insulin per amounts she thinks she needs.  She adjusts the dose upward when blood sugars are higher. She does not take her bolus before eating.  Rather 2-4 hours after the meal.   She is afraid that her blood sugars will drop.  I explained that her blood sugar begins to rise after eating, and that the insulin does not begin to lower the blood sugars until 15-30 min. After the meal.  We reviewed her blood sugar log, and she was shown that her blood sugars are higher so much higher after she eats.  It was stressed that the insulin is taken to prevent the blood sugar from rising, not to drop the blood sugars.  And that the insulin does not work as well when the blood sugar is high--causing her to take much more insulin.   She promised to take the insulin before meals.   Her first meal is at 3PM.  She takes between 2-10u for each meal.  She is not counting carbs, but taking extra when blood sugars are high.  How much extra, she has no formula.   Her second meal is at 10PM.  She will test at [facility] and take her insulin for this meal.    We discussed the need to take the insulin in proportion to how much carb she is eating.  She was given basic carb counting information, but we did not discuss this.  Strongly suggested that she talk with the dietitians upstairs to get a better understanding of this.   Discussed the bolus wizard, and what it can do to correct high blood sugars, but she did not appear interested in this at this time.

## 2013-05-21 NOTE — Patient Instructions (Signed)
1.  Take your insulin before each meal.  Start with 1u for each carb serving.   2.  Make an appt. with the dietitians for carb counting.   3.  Read over the [employer] manual about the bolus wizard for meal coverage and correction of high blood sugars.

## 2013-05-22 ENCOUNTER — Telehealth: Payer: Self-pay | Admitting: *Deleted

## 2013-05-22 MED ORDER — LEVOTHYROXINE SODIUM 125 MCG PO TABS: ORAL_TABLET | ORAL | Status: DC

## 2013-05-22 NOTE — Telephone Encounter (Signed)
Refilled to [facility].

## 2013-07-19 ENCOUNTER — Ambulatory Visit: Payer: BC Managed Care – PPO | Admitting: Endocrinology

## 2013-09-18 ENCOUNTER — Other Ambulatory Visit: Payer: Self-pay | Admitting: Endocrinology

## 2013-09-20 ENCOUNTER — Other Ambulatory Visit: Payer: Self-pay | Admitting: Endocrinology

## 2013-10-02 DIAGNOSIS — Z9641 Presence of insulin pump (external) (internal): Secondary | ICD-10-CM | POA: Insufficient documentation

## 2013-10-13 DIAGNOSIS — H42 Glaucoma in diseases classified elsewhere: Secondary | ICD-10-CM | POA: Insufficient documentation

## 2013-11-01 ENCOUNTER — Other Ambulatory Visit: Payer: Self-pay | Admitting: Obstetrics and Gynecology

## 2013-12-18 ENCOUNTER — Other Ambulatory Visit: Payer: Self-pay | Admitting: Endocrinology

## 2013-12-31 ENCOUNTER — Telehealth: Payer: Self-pay

## 2013-12-31 ENCOUNTER — Other Ambulatory Visit: Payer: Self-pay | Admitting: Endocrinology

## 2013-12-31 ENCOUNTER — Other Ambulatory Visit (INDEPENDENT_AMBULATORY_CARE_PROVIDER_SITE_OTHER): Payer: 59

## 2013-12-31 DIAGNOSIS — E039 Hypothyroidism, unspecified: Secondary | ICD-10-CM

## 2013-12-31 DIAGNOSIS — E109 Type 1 diabetes mellitus without complications: Secondary | ICD-10-CM

## 2013-12-31 LAB — HEMOGLOBIN A1C: HEMOGLOBIN A1C: 12.3 % — AB (ref 4.6–6.5)

## 2013-12-31 LAB — TSH: TSH: 22.87 u[IU]/mL — ABNORMAL HIGH (ref 0.35–4.50)

## 2013-12-31 MED ORDER — GLUCOSE BLOOD VI STRP: ORAL_STRIP | Status: DC

## 2013-12-31 MED ORDER — INSULIN LISPRO 100 UNIT/ML ~~LOC~~ SOLN: SUBCUTANEOUS | Status: DC

## 2013-12-31 MED ORDER — "PARADIGM QUICK-SET 43"" 9MM MISC": Status: DC

## 2013-12-31 NOTE — Telephone Encounter (Signed)
One touch test strips 90 day supply call [facility] insulin called in  qwik set for the pump also needed

## 2013-12-31 NOTE — Telephone Encounter (Signed)
done

## 2013-12-31 NOTE — Addendum Note (Signed)
Addended by: Moody Bruins E on: 12/31/2013 11:33 AM   Modules accepted: Orders

## 2013-12-31 NOTE — Telephone Encounter (Signed)
Rx refilled. Pt will need appointment for further refills.

## 2013-12-31 NOTE — Telephone Encounter (Signed)
Pt is coming today at 10:00 am for labs. What orders need to be placed? Thanks!

## 2014-01-07 ENCOUNTER — Encounter: Payer: Self-pay | Admitting: Endocrinology

## 2014-01-07 ENCOUNTER — Ambulatory Visit (INDEPENDENT_AMBULATORY_CARE_PROVIDER_SITE_OTHER): Payer: 59 | Admitting: Endocrinology

## 2014-01-07 VITALS — BP 130/88 | HR 93 | Temp 98.3°F | Ht 64.0 in | Wt 148.0 lb

## 2014-01-07 DIAGNOSIS — E109 Type 1 diabetes mellitus without complications: Secondary | ICD-10-CM

## 2014-01-07 DIAGNOSIS — E039 Hypothyroidism, unspecified: Secondary | ICD-10-CM

## 2014-01-07 NOTE — Patient Instructions (Signed)
reduce the basal rate of 0.4 units/hr, 24 hrs per day.  Please increase mealtime bolus of 1 unit/ 10 grams carbohydrate.  continue correction bolus (which some people call "sensitivity," or "insulin sensitivity ratio," or just "isr") of 1 unit for each 50 by which your glucose exceeds 100. check your blood sugar 4 times a day--before the 3 meals, and at bedtime.  also check if you have symptoms of your blood sugar being too high or too low.  please keep a record of the readings and bring it to your next appointment here.  please call us sooner if you are having low blood sugar episodes.  This is the next step in the treatment of your diabetes.   Please make every effort to remember the thyroid pill.   Please come back for a follow-up appointment in 3 months.

## 2014-01-07 NOTE — Progress Notes (Signed)
Subjective:    Patient ID: Katherine Navarro, female    DOB: Jun 26, 1955, 58 y.o.   MRN: [id number]  HPI Pt returns for f/u of type 1 dm (dx'ed 1978, when she presented with vaginitis; she has been on insulin since dx; she has been on pump rx since approx 2005; she has mild neuropathy of the lower extremities; she has associated retinopathy and nephropathy; therapy has been limited by noncompliance with cbg recording and f/u ov's; she has had only had severe hypoglycemia once (1988); she has never had pancreatitis or DKA).     She takes these pump settings: basal rate of 0.5 units/hr.  mealtime bolus of 1 unit/ 15 grams carbohydrate.   correction bolus (which some people call "sensitivity," or "insulin sensitivity ratio," or just "isr") of 1 unit for each 50 by which your glucose exceeds 100.  no cbg record, but states cbg's are mildly low a few times per week.  This usually happens in the early am.   She often misses the synthroid. Past Medical History  Diagnosis Date  . DIABETES MELLITUS, TYPE I 01/07/2007  . Unspecified hypothyroidism 12/13/2007  . HYPERCHOLESTEROLEMIA 12/13/2007  . LEUKOPENIA, CHRONIC 12/13/2007  . Nonproliferative diabetic retinopathy RXV(400.86) 12/13/2007  . GLAUCOMA 12/13/2007  . HYPERTENSION 01/07/2007  . CONSTIPATION, CHRONIC 12/13/2007  . INTERNAL HEMORRHOIDS 07/23/2008  . VARICOSE VEINS, LOWER EXTREMITIES 12/13/2007  . Hyperthyroidism   . DM nephropathy/sclerosis     Past Surgical History  Procedure Laterality Date  . Tubal ligation    . Eye surgery      bilateral  . Stress myoview   05/18/2004  . Electrocardiogram  08/24/2006  . Leep N/A 12/01/2012    Procedure: LOOP ELECTROSURGICAL EXCISION PROCEDURE (LEEP);  Surgeon: Delice Lesch, MD;  Location: [facility];  Service: Gynecology;  Laterality: N/A;    History   Social History  . Marital Status: Married    Spouse Name: N/A    Number of Children: N/A  . Years of Education: N/A   Occupational History  .        Doesn't work outside the home   Social History Main Topics  . Smoking status: Never Smoker   . Smokeless tobacco: Not on file  . Alcohol Use: No  . Drug Use: No  . Sexual Activity:    Other Topics Concern  . Not on file   Social History Narrative   Pt gets regular exercise.          Current Outpatient Prescriptions on File Prior to Visit  Medication Sig Dispense Refill  . alum & mag hydroxide-simeth (MYLANTA) 200-200-20 MG/5ML suspension as needed.        Marland Kitchen amiloride-hydrochlorothiazide (MODURETIC) 5-50 MG tablet 1/2 tab daily  45 tablet  3  . BAYER CONTOUR NEXT TEST test strip USE AS INSTRUCTED 10 TIMES A DAY  900 strip  1  . Cod Liver Oil CAPS Take 1 capsule by mouth daily.        . colestipol (COLESTID) 5 G granules Take 5 g by mouth daily.  500 g  1  . Glucosamine 500 MG TABS Take 1 tablet by mouth daily.        Marland Kitchen glucose blood (ONE TOUCH ULTRA TEST) test strip Use as instructed 10 TIMES A DAY.  900 each  0  . hydrocortisone-pramoxine (ANALPRAM-HC) 2.5-1 % rectal cream Place rectally as needed for hemorrhoids. Apply to rectum as needed  30 g  1  . ibuprofen (ADVIL,MOTRIN) 600  MG tablet Take 1 tablet (600 mg total) by mouth every 6 (six) hours as needed for pain.  30 tablet  0  . Insulin Infusion Pump Supplies (MINIMED INFUSION SET-MMT 396) MISC CHANGE EVERY 2 DAYS  50 each  0  . Insulin Infusion Pump Supplies (PARADIGM RESERVOIR 3ML) MISC CHANGE EVERY 2 DAYS  50 each  3  . insulin lispro (HUMALOG) 100 UNIT/ML injection INJECT 50 UNITS SUBCUTANEOUSLY EVERY DAY VIA INSULIN PUMP  50 mL  0  . levothyroxine (SYNTHROID, LEVOTHROID) 125 MCG tablet TAKE ONE TABLET BY MOUTH ONCE DAILY  90 tablet  0  . magnesium oxide (MAG-OX) 400 MG tablet Take 2 tablets once daily       . vitamin E 400 UNIT capsule Take 2 capsules by mouth twice a day as needed        No current facility-administered medications on file prior to visit.    Allergies  Allergen Reactions  . Atorvastatin      REACTION: perceived myalgias  . Ciprofloxacin     REACTION: pt states med  make her sick  . Epinephrine     REACTION: thyroid problems  . Erythromycin   . Morphine   . Penicillins     REACTION: hives    Family History  Problem Relation Age of Onset  . Cancer Mother     Breast Cancer  . Diabetes Mother   . Diabetes Father     BP 130/88  Pulse 93  Temp(Src) 98.3 F (36.8 C) (Oral)  Ht 5\' 4"  (1.626 m)  Wt 148 lb (67.132 kg)  BMI 25.39 kg/m2  SpO2 97%   Review of Systems Denies LOC and weight change    Objective:   Physical Exam Pulses: dorsalis pedis intact bilat.  Feet: no deformity. no ulcer on the feet. feet are of normal color and temp. no edema. Skin of the feet is thickened and hyperpigmented.  Neuro: sensation is intact to touch on the feet, but decreased from normal.    Lab Results  Component Value Date   HGBA1C 12.3* 12/31/2013   Lab Results  Component Value Date   TSH 22.87* 12/31/2013       Assessment & Plan:  DM: severe exacerbation. Noncompliance with cbg's.  She does not have the functional level to use the pump.  i advised discontinuation of pump.  She declines. Hypothyroidism, moderate exacerbation, usually due to noncompliance.    Patient is advised the following: Patient Instructions  reduce the basal rate of 0.4 units/hr, 24 hrs per day.  Please increase mealtime bolus of 1 unit/ 10 grams carbohydrate.  continue correction bolus (which some people call "sensitivity," or "insulin sensitivity ratio," or just "isr") of 1 unit for each 50 by which your glucose exceeds 100. check your blood sugar 4 times a day--before the 3 meals, and at bedtime.  also check if you have symptoms of your blood sugar being too high or too low.  please keep a record of the readings and bring it to your next appointment here.  please call us sooner if you are having low blood sugar episodes.  This is the next step in the treatment of your diabetes.   Please make every  effort to remember the thyroid pill.   Please come back for a follow-up appointment in 3 months.

## 2014-01-10 ENCOUNTER — Telehealth: Payer: Self-pay | Admitting: Endocrinology

## 2014-01-10 NOTE — Telephone Encounter (Signed)
Patient need refills on [place] [id number]

## 2014-01-10 NOTE — Telephone Encounter (Signed)
Patient need two refills [place] [id number]   Change every two days, box of ten pump.  2nd part,  Quick set,  9 mm 43 inches [id number], change every two days. Mailing order has changed, [facility], it is now [facility] [phone]  Fax for a 90 day supply refills for a year.

## 2014-01-10 NOTE — Telephone Encounter (Signed)
Patient stated that she need a refill for

## 2014-01-11 MED ORDER — PARADIGM PUMP RESERVOIR 3ML MISC: Status: DC

## 2014-01-11 MED ORDER — "PARADIGM QUICK-SET 43"" 9MM MISC": Status: DC

## 2014-01-11 NOTE — Telephone Encounter (Signed)
Rx sent to pharmacy. Pt advised via voicemail.

## 2014-02-15 ENCOUNTER — Telehealth: Payer: Self-pay | Admitting: Endocrinology

## 2014-02-15 NOTE — Telephone Encounter (Signed)
Patient ask if you would call her. °

## 2014-02-18 ENCOUNTER — Telehealth: Payer: Self-pay | Admitting: Endocrinology

## 2014-02-18 MED ORDER — GLUCOSE BLOOD VI STRP: ORAL_STRIP | Status: DC

## 2014-02-18 NOTE — Telephone Encounter (Signed)
Patient called stating   She is low on her test strips one touch ultra blue  Patient is testing 12x daily please send new prescription  Pharmacy: [email or web address]   Thank you

## 2014-02-18 NOTE — Telephone Encounter (Signed)
Rx sent per pt's request.  

## 2014-02-23 ENCOUNTER — Other Ambulatory Visit: Payer: Self-pay | Admitting: Endocrinology

## 2014-02-24 ENCOUNTER — Other Ambulatory Visit: Payer: Self-pay | Admitting: Endocrinology

## 2014-02-26 ENCOUNTER — Other Ambulatory Visit: Payer: Self-pay

## 2014-02-26 MED ORDER — LEVOTHYROXINE SODIUM 125 MCG PO TABS
ORAL_TABLET | ORAL | Status: DC
Start: 2014-02-26 — End: 2014-02-26

## 2014-02-26 MED ORDER — LEVOTHYROXINE SODIUM 125 MCG PO TABS: ORAL_TABLET | ORAL | Status: DC

## 2014-03-06 ENCOUNTER — Other Ambulatory Visit: Payer: Self-pay

## 2014-03-06 DIAGNOSIS — Z1231 Encounter for screening mammogram for malignant neoplasm of breast: Secondary | ICD-10-CM

## 2014-04-01 ENCOUNTER — Other Ambulatory Visit: Payer: 59

## 2014-04-01 ENCOUNTER — Other Ambulatory Visit (INDEPENDENT_AMBULATORY_CARE_PROVIDER_SITE_OTHER): Payer: 59

## 2014-04-01 DIAGNOSIS — E039 Hypothyroidism, unspecified: Secondary | ICD-10-CM

## 2014-04-01 DIAGNOSIS — E109 Type 1 diabetes mellitus without complications: Secondary | ICD-10-CM

## 2014-04-01 LAB — TSH: TSH: 0.58 u[IU]/mL (ref 0.35–4.50)

## 2014-04-01 LAB — HEMOGLOBIN A1C

## 2014-04-08 ENCOUNTER — Ambulatory Visit (INDEPENDENT_AMBULATORY_CARE_PROVIDER_SITE_OTHER): Payer: 59 | Admitting: Endocrinology

## 2014-04-08 ENCOUNTER — Encounter: Payer: Self-pay | Admitting: Endocrinology

## 2014-04-08 VITALS — BP 130/82 | HR 91 | Temp 98.2°F | Ht 64.0 in | Wt 146.0 lb

## 2014-04-08 DIAGNOSIS — E1021 Type 1 diabetes mellitus with diabetic nephropathy: Secondary | ICD-10-CM

## 2014-04-08 NOTE — Patient Instructions (Addendum)
Please reduce the basal rate of 0.4 units/hr, 24 hrs per day.   Please take a standard mealtime bolus of 5 units per meal.   continue correction bolus (which some people call "sensitivity," or "insulin sensitivity ratio," or just "isr") of 1 unit for each 50 by which your glucose exceeds 100. check your blood sugar 4 times a day--before the 3 meals, and at bedtime.  also check if you have symptoms of your blood sugar being too high or too low.  please keep a record of the readings and bring it to your next appointment here.  please call us sooner if you are having low blood sugar episodes.   Please come back for a follow-up appointment in 2 weeks.   Please continue the same thyroid medication

## 2014-04-08 NOTE — Progress Notes (Signed)
Subjective:    Patient ID: Katherine Navarro, female    DOB: [birth year], 58 y.o.   MRN: [id number]  HPI Pt returns for f/u of diabetes mellitus: DM type: 1 Dx'ed: 8469 Complications: polyneuropathy, retinopathy and nephropathy Therapy: insulin since dx GDM: never DKA: never Severe hypoglycemia: once (1988) Pancreatitis: never Other:  on pump rx since approx 2005; therapy has been limited by noncompliance with cbg recording, pump settings, and f/u ov's. Interval history:     She takes these pump settings: basal rate of 0.5 units/hr.  (she did not decreased as advised)  mealtime bolus of 1 unit/ 15 grams carbohydrate (she did not increase as advised) correction bolus (which some people call "sensitivity," or "insulin sensitivity ratio," or just "isr") of 1 unit for each 50 by which your glucose exceeds 100.  no cbg record, but states cbg's are still extremely variable.  She has mild hypoglycemia when a meal is delayed, or with activity.  She says it is highest after eating.  She averages approx 30 units total per day. Pt says she is also having trouble with her infusion sets, but she declines to see DM educator.    She now never misses the synthroid.   Past Medical History  Diagnosis Date  . DIABETES MELLITUS, TYPE I 01/07/2007  . Unspecified hypothyroidism 12/13/2007  . HYPERCHOLESTEROLEMIA 12/13/2007  . LEUKOPENIA, CHRONIC 12/13/2007  . Nonproliferative diabetic retinopathy GEX(528.41) 12/13/2007  . GLAUCOMA 12/13/2007  . HYPERTENSION 01/07/2007  . CONSTIPATION, CHRONIC 12/13/2007  . INTERNAL HEMORRHOIDS 07/23/2008  . VARICOSE VEINS, LOWER EXTREMITIES 12/13/2007  . Hyperthyroidism   . DM nephropathy/sclerosis     Past Surgical History  Procedure Laterality Date  . Tubal ligation    . Eye surgery      bilateral  . Stress myoview   05/18/2004  . Electrocardiogram  08/24/2006  . Leep N/A 12/01/2012    Procedure: LOOP ELECTROSURGICAL EXCISION PROCEDURE (LEEP);  Surgeon: Delice Lesch,  MD;  Location: [facility];  Service: Gynecology;  Laterality: N/A;    History   Social History  . Marital Status: Married    Spouse Name: N/A    Number of Children: N/A  . Years of Education: N/A   Occupational History  .      Doesn't work outside the home   Social History Main Topics  . Smoking status: Never Smoker   . Smokeless tobacco: Not on file  . Alcohol Use: No  . Drug Use: No  . Sexual Activity:    Other Topics Concern  . Not on file   Social History Narrative   Pt gets regular exercise.          Current Outpatient Prescriptions on File Prior to Visit  Medication Sig Dispense Refill  . alum & mag hydroxide-simeth (MYLANTA) 200-200-20 MG/5ML suspension as needed.        Marland Kitchen amiloride-hydrochlorothiazide (MODURETIC) 5-50 MG tablet 1/2 tab daily  45 tablet  3  . BAYER CONTOUR NEXT TEST test strip USE AS INSTRUCTED 10 TIMES A DAY  900 strip  1  . Cod Liver Oil CAPS Take 1 capsule by mouth daily.        . colestipol (COLESTID) 5 G granules Take 5 g by mouth daily.  500 g  1  . dorzolamide-timolol (COSOPT) 22.3-6.8 MG/ML ophthalmic solution 1 drop 2 (two) times daily.      . Glucosamine 500 MG TABS Take 1 tablet by mouth daily.        Marland Kitchen  glucose blood (ONE TOUCH ULTRA TEST) test strip Use as instructed 12 TIMES A DAY.  1200 each  1  . HUMALOG 100 UNIT/ML injection Inject subcutaneously 50  units every day via pump  50 mL  1  . hydrocortisone-pramoxine (ANALPRAM-HC) 2.5-1 % rectal cream Place rectally as needed for hemorrhoids. Apply to rectum as needed  30 g  1  . ibuprofen (ADVIL,MOTRIN) 600 MG tablet Take 1 tablet (600 mg total) by mouth every 6 (six) hours as needed for pain.  30 tablet  0  . Insulin Infusion Pump Supplies (MINIMED INFUSION SET-MMT 396) MISC CHANGE EVERY 2 DAYS  50 each  3  . Insulin Infusion Pump Supplies (PARADIGM RESERVOIR 3ML) MISC CHANGE EVERY 2 DAYS  50 each  3  . levothyroxine (SYNTHROID, LEVOTHROID) 125 MCG tablet TAKE ONE TABLET BY MOUTH ONCE  DAILY  90 tablet  0  . magnesium oxide (MAG-OX) 400 MG tablet Take 2 tablets once daily       . vitamin E 400 UNIT capsule Take 2 capsules by mouth twice a day as needed        No current facility-administered medications on file prior to visit.    Allergies  Allergen Reactions  . Atorvastatin     REACTION: perceived myalgias  . Ciprofloxacin     REACTION: pt states med  make her sick  . Epinephrine     REACTION: thyroid problems  . Erythromycin   . Morphine   . Penicillins     REACTION: hives    Family History  Problem Relation Age of Onset  . Cancer Mother     Breast Cancer  . Diabetes Mother   . Diabetes Father     BP 130/82  Pulse 91  Temp(Src) 98.2 F (36.8 C) (Oral)  Ht 5\' 4"  (1.626 m)  Wt 146 lb (66.225 kg)  BMI 25.05 kg/m2  SpO2 95%  Review of Systems She has lost a few lbs.  No LOC.      Objective:   Physical Exam VITAL SIGNS:  See vs page GENERAL: no distress Pulses: dorsalis pedis intact bilat.  Feet: no deformity. no ulcer on the feet. feet are of normal color and temp. no edema. Skin of the feet is thickened and hyperpigmented  Neuro: sensation is intact to touch on the feet, but decreased from normal.     Lab Results  Component Value Date   HGBA1C 12.0 Repeated and verified X2.* 04/01/2014   Lab Results  Component Value Date   TSH 0.58 04/01/2014      Assessment & Plan:  DM: severe exacerbation: i advised her to consider changing pump rx to qd injections.  She declines.  Hypothyroidism: well-controlled Noncompliance with cbg recording: I'll work around this as best I can.    Patient is advised the following: Patient Instructions  Please reduce the basal rate of 0.4 units/hr, 24 hrs per day.   Please take a standard mealtime bolus of 5 units per meal.   continue correction bolus (which some people call "sensitivity," or "insulin sensitivity ratio," or just "isr") of 1 unit for each 50 by which your glucose exceeds 100. check your  blood sugar 4 times a day--before the 3 meals, and at bedtime.  also check if you have symptoms of your blood sugar being too high or too low.  please keep a record of the readings and bring it to your next appointment here.  please call us sooner if you are having low  blood sugar episodes.   Please come back for a follow-up appointment in 2 weeks.   Please continue the same thyroid medication

## 2014-04-15 ENCOUNTER — Ambulatory Visit
Admission: RE | Admit: 2014-04-15 | Discharge: 2014-04-15 | Disposition: A | Discharge status: Home / Self Care | Payer: 59 | Source: Ambulatory Visit

## 2014-04-15 DIAGNOSIS — Z1231 Encounter for screening mammogram for malignant neoplasm of breast: Secondary | ICD-10-CM

## 2014-04-22 ENCOUNTER — Other Ambulatory Visit: Payer: Self-pay

## 2014-04-22 ENCOUNTER — Ambulatory Visit: Payer: 59 | Admitting: Endocrinology

## 2014-04-22 MED ORDER — LEVOTHYROXINE SODIUM 125 MCG PO TABS: ORAL_TABLET | ORAL | Status: DC

## 2014-04-24 ENCOUNTER — Other Ambulatory Visit: Payer: Self-pay

## 2014-04-24 MED ORDER — LEVOTHYROXINE SODIUM 125 MCG PO TABS: ORAL_TABLET | ORAL | Status: DC

## 2014-04-29 ENCOUNTER — Ambulatory Visit (INDEPENDENT_AMBULATORY_CARE_PROVIDER_SITE_OTHER): Payer: 59 | Admitting: Endocrinology

## 2014-04-29 ENCOUNTER — Telehealth: Payer: Self-pay | Admitting: Endocrinology

## 2014-04-29 ENCOUNTER — Encounter: Payer: Self-pay | Admitting: Endocrinology

## 2014-04-29 VITALS — BP 142/90 | HR 88 | Temp 98.3°F | Ht 64.0 in | Wt 147.0 lb

## 2014-04-29 DIAGNOSIS — E1021 Type 1 diabetes mellitus with diabetic nephropathy: Secondary | ICD-10-CM

## 2014-04-29 DIAGNOSIS — Z0289 Encounter for other administrative examinations: Secondary | ICD-10-CM

## 2014-04-29 NOTE — Patient Instructions (Addendum)
Please continue the basal rate of 0.4 units/hr, 24 hrs per day.   Please reduce the standard mealtime bolus to 3 units per meal.   continue correction bolus (which some people call "sensitivity," or "insulin sensitivity ratio," or just "isr") of 1 unit for each 50 by which your glucose exceeds 100. check your blood sugar 4 times a day--before the 3 meals, and at bedtime.  also check if you have symptoms of your blood sugar being too high or too low.  please keep a record of the readings and bring it to your next appointment here.  please call us sooner if you are having low blood sugar episodes.   Please come back for a regular physical appointment in 1 month.

## 2014-04-29 NOTE — Progress Notes (Signed)
Subjective:    Patient ID: Katherine Navarro, female    DOB: Dec 02, 1955, 58 y.o.   MRN: [id number]  HPI Pt returns for f/u of diabetes mellitus: DM type: 1 Dx'ed: 5188 Complications: polyneuropathy, retinopathy and nephropathy. Therapy: insulin since dx GDM: never DKA: never Severe hypoglycemia: once (1988) Pancreatitis: never Other:  on pump rx since approx 2005; therapy has been limited by noncompliance with cbg recording, pump settings, and f/u ov's. Interval history: She takes these pump settings: basal rate of 0.4 units/hr.  (she did not decreased as advised)  mealtime bolus of 5 units correction bolus (which some people call "sensitivity," or "insulin sensitivity ratio," or just "isr") of 1 unit for each 50 by which your glucose exceeds 100.  no cbg record, but states cbg's are frequently mildly low after eating.  It does not go low in the fasting state.  However, she takes a correction bolus approx 3 times per day.   Past Medical History  Diagnosis Date  . DIABETES MELLITUS, TYPE I 01/07/2007  . Unspecified hypothyroidism 12/13/2007  . HYPERCHOLESTEROLEMIA 12/13/2007  . LEUKOPENIA, CHRONIC 12/13/2007  . Nonproliferative diabetic retinopathy CZY(606.30) 12/13/2007  . GLAUCOMA 12/13/2007  . HYPERTENSION 01/07/2007  . CONSTIPATION, CHRONIC 12/13/2007  . INTERNAL HEMORRHOIDS 07/23/2008  . VARICOSE VEINS, LOWER EXTREMITIES 12/13/2007  . Hyperthyroidism   . DM nephropathy/sclerosis     Past Surgical History  Procedure Laterality Date  . Tubal ligation    . Eye surgery      bilateral  . Stress myoview   05/18/2004  . Electrocardiogram  08/24/2006  . Leep N/A 12/01/2012    Procedure: LOOP ELECTROSURGICAL EXCISION PROCEDURE (LEEP);  Surgeon: Delice Lesch, MD;  Location: [facility];  Service: Gynecology;  Laterality: N/A;    History   Social History  . Marital Status: Married    Spouse Name: N/A    Number of Children: N/A  . Years of Education: N/A   Occupational History  .        Doesn't work outside the home   Social History Main Topics  . Smoking status: Never Smoker   . Smokeless tobacco: Not on file  . Alcohol Use: No  . Drug Use: No  . Sexual Activity: Not on file   Other Topics Concern  . Not on file   Social History Narrative   Pt gets regular exercise.          Current Outpatient Prescriptions on File Prior to Visit  Medication Sig Dispense Refill  . alum & mag hydroxide-simeth (MYLANTA) 200-200-20 MG/5ML suspension as needed.      Marland Kitchen amiloride-hydrochlorothiazide (MODURETIC) 5-50 MG tablet 1/2 tab daily 45 tablet 3  . BAYER CONTOUR NEXT TEST test strip USE AS INSTRUCTED 10 TIMES A DAY 900 strip 1  . Cod Liver Oil CAPS Take 1 capsule by mouth daily.      . colestipol (COLESTID) 5 G granules Take 5 g by mouth daily. 500 g 1  . dorzolamide-timolol (COSOPT) 22.3-6.8 MG/ML ophthalmic solution 1 drop 2 (two) times daily.    . Glucosamine 500 MG TABS Take 1 tablet by mouth daily.      Marland Kitchen glucose blood (ONE TOUCH ULTRA TEST) test strip Use as instructed 12 TIMES A DAY. 1200 each 1  . HUMALOG 100 UNIT/ML injection Inject subcutaneously 50  units every day via pump 50 mL 1  . hydrocortisone-pramoxine (ANALPRAM-HC) 2.5-1 % rectal cream Place rectally as needed for hemorrhoids. Apply to rectum as needed 30  g 1  . ibuprofen (ADVIL,MOTRIN) 600 MG tablet Take 1 tablet (600 mg total) by mouth every 6 (six) hours as needed for pain. 30 tablet 0  . Insulin Infusion Pump Supplies (MINIMED INFUSION SET-MMT 396) MISC CHANGE EVERY 2 DAYS 50 each 3  . Insulin Infusion Pump Supplies (PARADIGM RESERVOIR 3ML) MISC CHANGE EVERY 2 DAYS 50 each 3  . levothyroxine (SYNTHROID, LEVOTHROID) 125 MCG tablet TAKE ONE TABLET BY MOUTH ONCE DAILY 90 tablet 0  . magnesium oxide (MAG-OX) 400 MG tablet Take 2 tablets once daily     . vitamin E 400 UNIT capsule Take 2 capsules by mouth twice a day as needed      No current facility-administered medications on file prior to visit.     Allergies  Allergen Reactions  . Atorvastatin     REACTION: perceived myalgias  . Ciprofloxacin     REACTION: pt states med  make her sick  . Epinephrine     REACTION: thyroid problems  . Erythromycin   . Morphine   . Penicillins     REACTION: hives    Family History  Problem Relation Age of Onset  . Cancer Mother     Breast Cancer  . Diabetes Mother   . Diabetes Father     BP 142/90 mmHg  Pulse 88  Temp(Src) 98.3 F (36.8 C) (Oral)  Ht 5\' 4"  (1.626 m)  Wt 147 lb (66.679 kg)  BMI 25.22 kg/m2  SpO2 97%  Review of Systems Denies LOC    Objective:   Physical Exam VITAL SIGNS:  See vs page. GENERAL: no distress. SKIN:  Insulin infusion sites at the anterior thighs are normal.       Assessment & Plan:  DM: Based on the pattern of her cbg's, she needs some adjustment in her therapy. HTN: mild exacerbation, possibly situational.    Patient is advised the following: Patient Instructions  Please continue the basal rate of 0.4 units/hr, 24 hrs per day.   Please reduce the standard mealtime bolus to 3 units per meal.   continue correction bolus (which some people call "sensitivity," or "insulin sensitivity ratio," or just "isr") of 1 unit for each 50 by which your glucose exceeds 100. check your blood sugar 4 times a day--before the 3 meals, and at bedtime.  also check if you have symptoms of your blood sugar being too high or too low.  please keep a record of the readings and bring it to your next appointment here.  please call us sooner if you are having low blood sugar episodes.   Please come back for a regular physical appointment in 1 month.

## 2014-04-29 NOTE — Telephone Encounter (Signed)
(  pt was seen today)

## 2014-04-29 NOTE — Telephone Encounter (Signed)
Patient no showed today's appt. Please advise on how to follow up. °A. No follow up necessary. °B. Follow up urgent. Contact patient immediately. °C. Follow up necessary. Contact patient and schedule visit in ___ days. °D. Follow up advised. Contact patient and schedule visit in ____weeks. ° °

## 2014-05-14 ENCOUNTER — Other Ambulatory Visit: Payer: Self-pay

## 2014-05-14 ENCOUNTER — Other Ambulatory Visit (INDEPENDENT_AMBULATORY_CARE_PROVIDER_SITE_OTHER): Payer: 59

## 2014-05-14 ENCOUNTER — Other Ambulatory Visit: Payer: 59

## 2014-05-14 DIAGNOSIS — Z0000 Encounter for general adult medical examination without abnormal findings: Secondary | ICD-10-CM

## 2014-05-14 LAB — CBC WITH DIFFERENTIAL/PLATELET
Basophils Absolute: 0 10*3/uL (ref 0.0–0.1)
Basophils Relative: 0.3 % (ref 0.0–3.0)
Eosinophils Absolute: 0.1 10*3/uL (ref 0.0–0.7)
Eosinophils Relative: 2.3 % (ref 0.0–5.0)
HCT: 41.7 % (ref 36.0–46.0)
Hemoglobin: 13.4 g/dL (ref 12.0–15.0)
Lymphocytes Relative: 29.6 % (ref 12.0–46.0)
Lymphs Abs: 1.2 10*3/uL (ref 0.7–4.0)
MCHC: 32.2 g/dL (ref 30.0–36.0)
MCV: 82.8 fl (ref 78.0–100.0)
Monocytes Absolute: 0.4 10*3/uL (ref 0.1–1.0)
Monocytes Relative: 9.9 % (ref 3.0–12.0)
NEUTROS PCT: 57.9 % (ref 43.0–77.0)
Neutro Abs: 2.4 10*3/uL (ref 1.4–7.7)
PLATELETS: 265 10*3/uL (ref 150.0–400.0)
RBC: 5.03 Mil/uL (ref 3.87–5.11)
RDW: 13.2 % (ref 11.5–15.5)
WBC: 4.2 10*3/uL (ref 4.0–10.5)

## 2014-05-14 LAB — BASIC METABOLIC PANEL
BUN: 23 mg/dL (ref 6–23)
CALCIUM: 9.5 mg/dL (ref 8.4–10.5)
CO2: 27 mEq/L (ref 19–32)
Chloride: 97 mEq/L (ref 96–112)
Creatinine, Ser: 0.9 mg/dL (ref 0.4–1.2)
GFR: 79.01 mL/min (ref >60.00)
Glucose, Bld: 223 mg/dL — ABNORMAL HIGH (ref 70–99)
POTASSIUM: 4.2 meq/L (ref 3.5–5.1)
SODIUM: 134 meq/L — AB (ref 135–145)

## 2014-05-14 LAB — LIPID PANEL
Cholesterol: 280 mg/dL — ABNORMAL HIGH (ref 0–200)
HDL: 67.2 mg/dL (ref >39.00)
LDL Cholesterol: 194 mg/dL — ABNORMAL HIGH (ref 0–99)
NONHDL: 212.8
Total CHOL/HDL Ratio: 4
Triglycerides: 96 mg/dL (ref 0.0–149.0)
VLDL: 19.2 mg/dL (ref 0.0–40.0)

## 2014-05-14 LAB — MICROALBUMIN / CREATININE URINE RATIO
CREATININE, U: 76.9 mg/dL
MICROALB/CREAT RATIO: 2.1 mg/g (ref 0.0–30.0)
Microalb, Ur: 1.6 mg/dL (ref 0.0–1.9)

## 2014-05-14 LAB — HEPATIC FUNCTION PANEL
ALK PHOS: 79 U/L (ref 39–117)
ALT: 19 U/L (ref 0–35)
AST: 29 U/L (ref 0–37)
Albumin: 4.2 g/dL (ref 3.5–5.2)
Bilirubin, Direct: 0 mg/dL (ref 0.0–0.3)
Total Bilirubin: 0.4 mg/dL (ref 0.2–1.2)
Total Protein: 7.5 g/dL (ref 6.0–8.3)

## 2014-05-14 LAB — TSH: TSH: 1.69 u[IU]/mL (ref 0.35–4.50)

## 2014-05-21 ENCOUNTER — Encounter: Payer: Self-pay | Admitting: Endocrinology

## 2014-05-21 ENCOUNTER — Ambulatory Visit (INDEPENDENT_AMBULATORY_CARE_PROVIDER_SITE_OTHER): Payer: 59 | Admitting: Endocrinology

## 2014-05-21 VITALS — BP 122/80 | HR 89 | Temp 98.2°F | Ht 64.0 in | Wt 146.0 lb

## 2014-05-21 DIAGNOSIS — E1021 Type 1 diabetes mellitus with diabetic nephropathy: Secondary | ICD-10-CM

## 2014-05-21 DIAGNOSIS — Z23 Encounter for immunization: Secondary | ICD-10-CM

## 2014-05-21 DIAGNOSIS — Z0000 Encounter for general adult medical examination without abnormal findings: Secondary | ICD-10-CM

## 2014-05-21 NOTE — Progress Notes (Signed)
Subjective:    Patient ID: Katherine Navarro, female    DOB: 02/22/1956, 58 y.o.   MRN: [id number]  HPI Pt is here for regular wellness examination, and is feeling pretty well in general, and says chronic med probs are stable, except as noted below. Past Medical History  Diagnosis Date  . DIABETES MELLITUS, TYPE I 01/07/2007  . Unspecified hypothyroidism 12/13/2007  . HYPERCHOLESTEROLEMIA 12/13/2007  . LEUKOPENIA, CHRONIC 12/13/2007  . Nonproliferative diabetic retinopathy FHQ(197.58) 12/13/2007  . GLAUCOMA 12/13/2007  . HYPERTENSION 01/07/2007  . CONSTIPATION, CHRONIC 12/13/2007  . INTERNAL HEMORRHOIDS 07/23/2008  . VARICOSE VEINS, LOWER EXTREMITIES 12/13/2007  . Hyperthyroidism   . DM nephropathy/sclerosis     Past Surgical History  Procedure Laterality Date  . Tubal ligation    . Eye surgery      bilateral  . Stress myoview   05/18/2004  . Electrocardiogram  08/24/2006  . Leep N/A 12/01/2012    Procedure: LOOP ELECTROSURGICAL EXCISION PROCEDURE (LEEP);  Surgeon: Delice Lesch, MD;  Location: [facility];  Service: Gynecology;  Laterality: N/A;    History   Social History  . Marital Status: Married    Spouse Name: N/A    Number of Children: N/A  . Years of Education: N/A   Occupational History  .      Doesn't work outside the home   Social History Main Topics  . Smoking status: Never Smoker   . Smokeless tobacco: Not on file  . Alcohol Use: No  . Drug Use: No  . Sexual Activity: Not on file   Other Topics Concern  . Not on file   Social History Narrative   Pt gets regular exercise.          Current Outpatient Prescriptions on File Prior to Visit  Medication Sig Dispense Refill  . alum & mag hydroxide-simeth (MYLANTA) 200-200-20 MG/5ML suspension as needed.      Marland Kitchen amiloride-hydrochlorothiazide (MODURETIC) 5-50 MG tablet 1/2 tab daily 45 tablet 3  . BAYER CONTOUR NEXT TEST test strip USE AS INSTRUCTED 10 TIMES A DAY 900 strip 1  . Cod Liver Oil CAPS Take 1 capsule  by mouth daily.      . colestipol (COLESTID) 5 G granules Take 5 g by mouth daily. 500 g 1  . dorzolamide-timolol (COSOPT) 22.3-6.8 MG/ML ophthalmic solution 1 drop 2 (two) times daily.    . Glucosamine 500 MG TABS Take 1 tablet by mouth daily.      Marland Kitchen glucose blood (ONE TOUCH ULTRA TEST) test strip Use as instructed 12 TIMES A DAY. 1200 each 1  . HUMALOG 100 UNIT/ML injection Inject subcutaneously 50  units every day via pump 50 mL 1  . hydrocortisone-pramoxine (ANALPRAM-HC) 2.5-1 % rectal cream Place rectally as needed for hemorrhoids. Apply to rectum as needed 30 g 1  . ibuprofen (ADVIL,MOTRIN) 600 MG tablet Take 1 tablet (600 mg total) by mouth every 6 (six) hours as needed for pain. 30 tablet 0  . Insulin Infusion Pump Supplies (MINIMED INFUSION SET-MMT 396) MISC CHANGE EVERY 2 DAYS 50 each 3  . Insulin Infusion Pump Supplies (PARADIGM RESERVOIR 3ML) MISC CHANGE EVERY 2 DAYS 50 each 3  . levothyroxine (SYNTHROID, LEVOTHROID) 125 MCG tablet TAKE ONE TABLET BY MOUTH ONCE DAILY 90 tablet 0  . magnesium oxide (MAG-OX) 400 MG tablet Take 2 tablets once daily     . vitamin E 400 UNIT capsule Take 2 capsules by mouth twice a day as needed  No current facility-administered medications on file prior to visit.    Allergies  Allergen Reactions  . Atorvastatin     REACTION: perceived myalgias  . Ciprofloxacin     REACTION: pt states med  make her sick  . Epinephrine     REACTION: thyroid problems  . Erythromycin   . Morphine   . Penicillins     REACTION: hives    Family History  Problem Relation Age of Onset  . Cancer Mother     Breast Cancer  . Diabetes Mother   . Diabetes Father     BP 122/80 mmHg  Pulse 89  Temp(Src) 98.2 F (36.8 C) (Oral)  Ht 5\' 4"  (1.626 m)  Wt 146 lb (66.225 kg)  BMI 25.05 kg/m2  SpO2 97%  Review of Systems  Constitutional: Negative for fever.  HENT: Negative for hearing loss.   Eyes: Negative for visual disturbance.  Respiratory: Negative for  shortness of breath.   Cardiovascular: Negative for chest pain.  Gastrointestinal: Negative for anal bleeding.  Endocrine: Negative for cold intolerance.  Genitourinary: Negative for hematuria.  Musculoskeletal: Negative for back pain.  Skin: Negative for rash.  Allergic/Immunologic: Positive for environmental allergies.  Neurological: Negative for numbness.  Hematological: Does not bruise/bleed easily.  Psychiatric/Behavioral: Negative for dysphoric mood.      Objective:   Physical Exam VS: see vs page GEN: no distress HEAD: head: no deformity eyes: no periorbital swelling; moderate bilat proptosis external nose and ears are normal mouth: no lesion seen NECK: supple, thyroid is not enlarged CHEST WALL: no deformity LUNGS:  Clear to auscultation BREASTS: sees gyn CV: reg rate and rhythm, no murmur ABD: abdomen is soft, nontender.  no hepatosplenomegaly.  not distended.  no hernia GENITALIA/RECTAL: sees gyn MUSCULOSKELETAL: muscle bulk and strength are grossly normal.  no obvious joint swelling.  gait is normal and steady PULSES: no carotid bruit NEURO:  cn 2-12 grossly intact.   readily moves all 4's.  SKIN:  Normal texture and temperature.  No rash or suspicious lesion is visible.   NODES:  None palpable at the neck PSYCH: alert, well-oriented.  Does not appear anxious nor depressed.     Assessment & Plan:  Wellness visit today, with problems stable, except as noted. please consider these measures for your health:  minimize alcohol.  do not use tobacco products.  have a colonoscopy at least every 10 years from age 55.  Women should have an annual mammogram from age 67.  keep firearms safely stored.  always use seat belts.  have working smoke alarms in your home.  see an eye doctor and dentist regularly.  never drive under the influence of alcohol or drugs (including prescription drugs).     SEPARATE EVALUATION FOLLOWS--EACH PROBLEM HERE IS NEW, NOT RESPONDING TO TREATMENT,  OR POSES SIGNIFICANT RISK TO THE PATIENT'S HEALTH: HISTORY OF THE PRESENT ILLNESS: Pt returns for f/u of diabetes mellitus: DM type: 1 Dx'ed: 5638 Complications: polyneuropathy, retinopathy and nephropathy. Therapy: insulin since dx GDM: never DKA: never Severe hypoglycemia: once (1988) Pancreatitis: never Other:  on pump rx since approx 2005; therapy has been limited by noncompliance with cbg recording, pump settings, and f/u ov's. Interval history: She takes these pump settings: basal rate of 0.4 units/hr.   mealtime bolus of 3 units.   correction bolus (which some people call "sensitivity," or "insulin sensitivity ratio," or just "isr") of 1 unit for each 50 by which your glucose exceeds 100.  no cbg record, but states  cbg's are still often low after a bolus of just 3 units.  She says there is otherwise no trend throughout the day.  PAST MEDICAL HISTORY reviewed and up to date today REVIEW OF SYSTEMS: Denies LOC and weight change PHYSICAL EXAMINATION: VITAL SIGNS:  See vs page GENERAL: no distress Pulses: dorsalis pedis intact bilat.  Feet: no deformity. no ulcer on the feet. no edema.  Skin: of the feet is thickened and hyperpigmented. feet are of normal temp.  Neuro: sensation is intact to touch on the feet, but decreased from normal.  LAB/XRAY RESULTS: Lab Results  Component Value Date   HGBA1C 12.0 Repeated and verified X2.* 04/01/2014  IMPRESSION: DM: severe exacerbation Noncompliance with cbg recording, persistent: I'll work around this as best I can PLAN:  Please continue the basal rate of 0.4 units/hr, 24 hrs per day.   Please reduce the standard mealtime bolus to 3 units per meal.   continue correction bolus (which some people call "sensitivity," or "insulin sensitivity ratio," or just "isr") of 1 unit for each 50 by which your glucose exceeds 100. check your blood sugar 4 times a day--before the 3 meals, and at bedtime.  also check if you have symptoms of your  blood sugar being too high or too low.  please keep a record of the readings and bring it to your next appointment here.  please call us sooner if you are having low blood sugar episodes.  Here is a book to write it in.

## 2014-05-21 NOTE — Patient Instructions (Addendum)
Please continue the basal rate of 0.4 units/hr, 24 hrs per day.   Please reduce the standard mealtime bolus to 3 units per meal.   continue correction bolus (which some people call "sensitivity," or "insulin sensitivity ratio," or just "isr") of 1 unit for each 50 by which your glucose exceeds 100. check your blood sugar 4 times a day--before the 3 meals, and at bedtime.  also check if you have symptoms of your blood sugar being too high or too low.  please keep a record of the readings and bring it to your next appointment here.  please call us sooner if you are having low blood sugar episodes.  Here is a book to write it in.   Please come back for a follow-up appointment in 3 months.   please consider these measures for your health:  minimize alcohol.  do not use tobacco products.  have a colonoscopy at least every 10 years from age 62.  Women should have an annual mammogram from age 76.  keep firearms safely stored.  always use seat belts.  have working smoke alarms in your home.  see an eye doctor and dentist regularly.  never drive under the influence of alcohol or drugs (including prescription drugs).

## 2014-05-22 DIAGNOSIS — Z0000 Encounter for general adult medical examination without abnormal findings: Secondary | ICD-10-CM | POA: Insufficient documentation

## 2014-08-09 ENCOUNTER — Other Ambulatory Visit: Payer: Self-pay | Admitting: Endocrinology

## 2014-08-16 ENCOUNTER — Telehealth: Payer: Self-pay | Admitting: Endocrinology

## 2014-08-16 MED ORDER — "PARADIGM QUICK-SET 43"" 9MM MISC": Status: DC

## 2014-08-16 MED ORDER — LEVOTHYROXINE SODIUM 125 MCG PO TABS: ORAL_TABLET | ORAL | Status: DC

## 2014-08-16 MED ORDER — PARADIGM PUMP RESERVOIR 3ML MISC: Status: DC

## 2014-08-16 MED ORDER — GLUCOSE BLOOD VI STRP: ORAL_STRIP | Status: DC

## 2014-08-16 NOTE — Telephone Encounter (Signed)
Pt needs refill on quick set 43inches 9 mmm supply number [id number] , reservior  [id number]  ( 90 days supply on both)   And one touch ultra blue test strips t test 12 times a day ( 90 day supply)  Call pt and let her know when is done please  Thanks Baxter Flattery

## 2014-08-16 NOTE — Telephone Encounter (Signed)
Rx sent to pharmacy and pt notified  ?

## 2014-08-20 ENCOUNTER — Ambulatory Visit: Payer: 59 | Admitting: Endocrinology

## 2014-09-13 ENCOUNTER — Ambulatory Visit: Payer: Self-pay | Admitting: Endocrinology

## 2014-10-18 ENCOUNTER — Ambulatory Visit: Payer: Self-pay | Admitting: Endocrinology

## 2014-11-19 ENCOUNTER — Other Ambulatory Visit: Payer: Self-pay

## 2014-11-19 ENCOUNTER — Telehealth: Payer: Self-pay

## 2014-11-19 ENCOUNTER — Encounter: Payer: Self-pay | Admitting: Endocrinology

## 2014-11-19 ENCOUNTER — Ambulatory Visit (INDEPENDENT_AMBULATORY_CARE_PROVIDER_SITE_OTHER): Payer: 59 | Admitting: Endocrinology

## 2014-11-19 VITALS — BP 134/88 | HR 87 | Wt 148.0 lb

## 2014-11-19 DIAGNOSIS — E1021 Type 1 diabetes mellitus with diabetic nephropathy: Secondary | ICD-10-CM | POA: Diagnosis not present

## 2014-11-19 DIAGNOSIS — R200 Anesthesia of skin: Secondary | ICD-10-CM

## 2014-11-19 LAB — HEMOGLOBIN A1C: Hgb A1c MFr Bld: 11.6 % — ABNORMAL HIGH (ref 4.6–6.5)

## 2014-11-19 LAB — VITAMIN B12: Vitamin B-12: 1103 pg/mL — ABNORMAL HIGH (ref 211–911)

## 2014-11-19 LAB — TSH: TSH: 6.25 u[IU]/mL — ABNORMAL HIGH (ref 0.35–4.50)

## 2014-11-19 LAB — VITAMIN D 25 HYDROXY (VIT D DEFICIENCY, FRACTURES): VITD: 32.97 ng/mL (ref 30.00–100.00)

## 2014-11-19 MED ORDER — LEVOTHYROXINE SODIUM 137 MCG PO TABS
137.0000 ug | ORAL_TABLET | Freq: Every day | ORAL | Status: DC
Start: 2014-11-19 — End: 2015-11-04

## 2014-11-19 MED ORDER — PARADIGM PUMP RESERVOIR 3ML MISC: Status: DC

## 2014-11-19 MED ORDER — GLUCOSE BLOOD VI STRP: ORAL_STRIP | Status: DC

## 2014-11-19 MED ORDER — "PARADIGM QUICK-SET 43"" 9MM MISC": Status: DC

## 2014-11-19 MED ORDER — INSULIN LISPRO 100 UNIT/ML ~~LOC~~ SOLN: SUBCUTANEOUS | Status: DC

## 2014-11-19 MED ORDER — FLUTICASONE PROPIONATE 50 MCG/ACT NA SUSP: 2.0000 | Freq: Every day | NASAL | Status: DC

## 2014-11-19 NOTE — Patient Instructions (Addendum)
Please continue your basal rate of 0.4 units/hr, 24 hrs per day.   Please reduce your standard mealtime bolus to 2 units per meal.   continue correction bolus (which some people call "sensitivity," or "insulin sensitivity ratio," or just "isr") of 1 unit for each 50 by which your glucose exceeds 100. check your blood sugar 4 times a day--before the 3 meals, and at bedtime.  also check if you have symptoms of your blood sugar being too high or too low.  please keep a record of the readings and bring it to your next appointment here.   blood tests are requested for you today.  We'll let you know about the results. Please come back for a follow-up appointment in 3 months.   Loratadine-d (non-prescription) will help your congestion.  i have sent a prescription to your pharmacy, for flonase.

## 2014-11-19 NOTE — Telephone Encounter (Signed)
Lab notified that orders have been added.

## 2014-11-19 NOTE — Telephone Encounter (Signed)
Patient wanted to know if her thyroid labs should be added to the blood work that was drawn today. Please advise, Thanks!

## 2014-11-19 NOTE — Telephone Encounter (Signed)
Ok, i ordered.  Please advise lab

## 2014-11-19 NOTE — Progress Notes (Signed)
Subjective:    Patient ID: Katherine Navarro, female    DOB: 04/14/1956, 59 y.o.   MRN: [id number]  HPI Pt returns for f/u of diabetes mellitus:   DM type: 1 Dx'ed: 1978.   Complications: polyneuropathy, retinopathy and nephropathy. Therapy: insulin since dx GDM: never DKA: never Severe hypoglycemia: once (1988) Pancreatitis: never Other:  on pump rx since approx 2005; therapy has been limited by noncompliance with cbg recording, pump settings, and f/u ov's. Interval history: She takes these pump settings:  basal rate of 0.4 units/hr.   mealtime bolus of 3 units.   correction bolus (which some people call "sensitivity," or "insulin sensitivity ratio," or just "isr") of 1 unit for each 50 by which your glucose exceeds 100.   no cbg record, but states cbg's are still often low after a bolus of just 3 units.  She says she then eats again, and cbg goes high.  She then takes multiple boluses.   She averages a total of approx 50 units/day.   Pt states 1 month of moderate rhinorrhea from both nostrils, and assoc nasal congestion.   Past Medical History  Diagnosis Date  . DIABETES MELLITUS, TYPE I 01/07/2007  . Unspecified hypothyroidism 12/13/2007  . HYPERCHOLESTEROLEMIA 12/13/2007  . LEUKOPENIA, CHRONIC 12/13/2007  . Nonproliferative diabetic retinopathy KVQ(259.56) 12/13/2007  . GLAUCOMA 12/13/2007  . HYPERTENSION 01/07/2007  . CONSTIPATION, CHRONIC 12/13/2007  . INTERNAL HEMORRHOIDS 07/23/2008  . VARICOSE VEINS, LOWER EXTREMITIES 12/13/2007  . Hyperthyroidism   . DM nephropathy/sclerosis     Past Surgical History  Procedure Laterality Date  . Tubal ligation    . Eye surgery      bilateral  . Stress myoview   05/18/2004  . Electrocardiogram  08/24/2006  . Leep N/A 12/01/2012    Procedure: LOOP ELECTROSURGICAL EXCISION PROCEDURE (LEEP);  Surgeon: Delice Lesch, MD;  Location: [facility];  Service: Gynecology;  Laterality: N/A;    History   Social History  . Marital Status: Married   Spouse Name: N/A  . Number of Children: N/A  . Years of Education: N/A   Occupational History  .      Doesn't work outside the home   Social History Main Topics  . Smoking status: Never Smoker   . Smokeless tobacco: Not on file  . Alcohol Use: No  . Drug Use: No  . Sexual Activity: Not on file   Other Topics Concern  . Not on file   Social History Narrative   Pt gets regular exercise.          Current Outpatient Prescriptions on File Prior to Visit  Medication Sig Dispense Refill  . alum & mag hydroxide-simeth (MYLANTA) 200-200-20 MG/5ML suspension as needed.      Marland Kitchen amiloride-hydrochlorothiazide (MODURETIC) 5-50 MG tablet 1/2 tab daily 45 tablet 3  . BAYER CONTOUR NEXT TEST test strip USE AS INSTRUCTED 10 TIMES A DAY 900 strip 1  . Cod Liver Oil CAPS Take 1 capsule by mouth daily.      . colestipol (COLESTID) 5 G granules Take 5 g by mouth daily. 500 g 1  . dorzolamide-timolol (COSOPT) 22.3-6.8 MG/ML ophthalmic solution 1 drop 2 (two) times daily.    . Glucosamine 500 MG TABS Take 1 tablet by mouth daily.      . hydrocortisone-pramoxine (ANALPRAM-HC) 2.5-1 % rectal cream Place rectally as needed for hemorrhoids. Apply to rectum as needed 30 g 1  . ibuprofen (ADVIL,MOTRIN) 600 MG tablet Take 1 tablet (600 mg  total) by mouth every 6 (six) hours as needed for pain. 30 tablet 0  . magnesium oxide (MAG-OX) 400 MG tablet Take 2 tablets once daily     . vitamin E 400 UNIT capsule Take 2 capsules by mouth twice a day as needed      No current facility-administered medications on file prior to visit.    Allergies  Allergen Reactions  . Atorvastatin     REACTION: perceived myalgias  . Ciprofloxacin     REACTION: pt states med  make her sick  . Epinephrine     REACTION: thyroid problems  . Erythromycin   . Morphine   . Penicillins     REACTION: hives    Family History  Problem Relation Age of Onset  . Cancer Mother     Breast Cancer  . Diabetes Mother   . Diabetes  Father     BP 134/88 mmHg  Pulse 87  Wt 148 lb (67.132 kg)  SpO2 98%  Review of Systems Denies LOC and     Objective:   Physical Exam VITAL SIGNS:  See vs page.   GENERAL: no distress. head: no deformity eyes: no periorbital swelling, no proptosis external nose and ears are normal mouth: no lesion seen Both eac's and tm's are normal   Pulses: dorsalis pedis intact bilat.   MSK: no deformity of the feet.   CV: trace bilat leg edema.   Skin:  no ulcer on the feet.  normal temp on the feet, but there is patchy hyperpigmentation. Neuro: sensation is intact to touch on the feet, but decreased from normal   Lab Results  Component Value Date   HGBA1C 11.6* 11/19/2014       Assessment & Plan:  DM: ongoing poor control Allergic rhinitis, new. Hypothyroidism: she needs increased rx.  Patient is advised the following: Patient Instructions  Please continue your basal rate of 0.4 units/hr, 24 hrs per day.   Please reduce your standard mealtime bolus to 2 units per meal.   continue correction bolus (which some people call "sensitivity," or "insulin sensitivity ratio," or just "isr") of 1 unit for each 50 by which your glucose exceeds 100. check your blood sugar 4 times a day--before the 3 meals, and at bedtime.  also check if you have symptoms of your blood sugar being too high or too low.  please keep a record of the readings and bring it to your next appointment here.   blood tests are requested for you today.  We'll let you know about the results. Please come back for a follow-up appointment in 3 months.   Loratadine-d (non-prescription) will help your congestion.  i have sent a prescription to your pharmacy, for flonase.    addendum: i have sent a prescription to your pharmacy, to increase synthroid

## 2015-01-28 ENCOUNTER — Other Ambulatory Visit: Payer: Self-pay

## 2015-01-28 DIAGNOSIS — Z1231 Encounter for screening mammogram for malignant neoplasm of breast: Secondary | ICD-10-CM

## 2015-02-17 ENCOUNTER — Telehealth: Payer: Self-pay

## 2015-02-17 NOTE — Telephone Encounter (Signed)
Pt will get HgA1c rechecked on 02/21/2015

## 2015-02-21 ENCOUNTER — Ambulatory Visit: Payer: 59 | Admitting: Endocrinology

## 2015-03-25 DIAGNOSIS — E113513 Type 2 diabetes mellitus with proliferative diabetic retinopathy with macular edema, bilateral: Secondary | ICD-10-CM | POA: Insufficient documentation

## 2015-03-25 DIAGNOSIS — Z961 Presence of intraocular lens: Secondary | ICD-10-CM | POA: Insufficient documentation

## 2015-04-14 ENCOUNTER — Encounter: Payer: Self-pay | Admitting: Endocrinology

## 2015-04-14 ENCOUNTER — Ambulatory Visit (INDEPENDENT_AMBULATORY_CARE_PROVIDER_SITE_OTHER): Payer: 59 | Admitting: Endocrinology

## 2015-04-14 VITALS — BP 146/84 | HR 84 | Temp 98.1°F | Ht 64.0 in | Wt 145.0 lb

## 2015-04-14 DIAGNOSIS — E1021 Type 1 diabetes mellitus with diabetic nephropathy: Secondary | ICD-10-CM | POA: Diagnosis not present

## 2015-04-14 LAB — POCT GLYCOSYLATED HEMOGLOBIN (HGB A1C): Hemoglobin A1C: 11.9

## 2015-04-14 NOTE — Progress Notes (Signed)
Subjective:    Patient ID: Katherine Navarro, female    DOB: 1956/03/13, 60 y.o.   MRN: [id number]  HPI Pt returns for f/u of diabetes mellitus:   DM type: 1 Dx'ed: 1978.   Complications: polyneuropathy, retinopathy and nephropathy. Therapy: insulin since dx GDM: never DKA: never Severe hypoglycemia: once (1988) Pancreatitis: never Other:  on pump rx since approx 2005; therapy has been limited by noncompliance with cbg recording, and dosing via her pump.   Interval history: She takes these pump settings:  basal rate of 0.4 units/hr.   mealtime bolus of 2 units (however, she takes a widely varying bolus amount).   correction bolus (which some people call "sensitivity," or "insulin sensitivity ratio," or just "isr") of 1 unit for each 50 by which your glucose exceeds 100.   no cbg record, but states cbg's are still usually high.  She averages a total of approx 40 units/day.   Pt says she takes moduretic intermittently.   Past Medical History  Diagnosis Date  . DIABETES MELLITUS, TYPE I 01/07/2007  . Unspecified hypothyroidism 12/13/2007  . HYPERCHOLESTEROLEMIA 12/13/2007  . LEUKOPENIA, CHRONIC 12/13/2007  . Nonproliferative diabetic retinopathy WFU(932.35) 12/13/2007  . GLAUCOMA 12/13/2007  . HYPERTENSION 01/07/2007  . CONSTIPATION, CHRONIC 12/13/2007  . INTERNAL HEMORRHOIDS 07/23/2008  . VARICOSE VEINS, LOWER EXTREMITIES 12/13/2007  . Hyperthyroidism   . DM nephropathy/sclerosis     Past Surgical History  Procedure Laterality Date  . Tubal ligation    . Eye surgery      bilateral  . Stress myoview   05/18/2004  . Electrocardiogram  08/24/2006  . Leep N/A 12/01/2012    Procedure: LOOP ELECTROSURGICAL EXCISION PROCEDURE (LEEP);  Surgeon: Delice Lesch, MD;  Location: [facility];  Service: Gynecology;  Laterality: N/A;    Social History   Social History  . Marital Status: Married    Spouse Name: N/A  . Number of Children: N/A  . Years of Education: N/A   Occupational History    .      Doesn't work outside the home   Social History Main Topics  . Smoking status: Never Smoker   . Smokeless tobacco: Not on file  . Alcohol Use: No  . Drug Use: No  . Sexual Activity: Not on file   Other Topics Concern  . Not on file   Social History Narrative   Pt gets regular exercise.          Current Outpatient Prescriptions on File Prior to Visit  Medication Sig Dispense Refill  . alum & mag hydroxide-simeth (MYLANTA) 200-200-20 MG/5ML suspension as needed.      Marland Kitchen BAYER CONTOUR NEXT TEST test strip USE AS INSTRUCTED 10 TIMES A DAY 900 strip 1  . Cod Liver Oil CAPS Take 1 capsule by mouth daily.      . colestipol (COLESTID) 5 G granules Take 5 g by mouth daily. 500 g 1  . dorzolamide-timolol (COSOPT) 22.3-6.8 MG/ML ophthalmic solution 1 drop 2 (two) times daily.    . Glucosamine 500 MG TABS Take 1 tablet by mouth daily.      Marland Kitchen glucose blood (ONE TOUCH ULTRA TEST) test strip Use as instructed 12 TIMES A DAY. 1200 each 1  . hydrocortisone-pramoxine (ANALPRAM-HC) 2.5-1 % rectal cream Place rectally as needed for hemorrhoids. Apply to rectum as needed 30 g 1  . ibuprofen (ADVIL,MOTRIN) 600 MG tablet Take 1 tablet (600 mg total) by mouth every 6 (six) hours as needed for pain. [place]  tablet 0  . Insulin Infusion Pump Supplies (MINIMED INFUSION SET-MMT 396) MISC CHANGE EVERY 2 DAYS 50 each 3  . Insulin Infusion Pump Supplies (PARADIGM RESERVOIR 3ML) MISC CHANGE EVERY 2 DAYS 50 each 3  . insulin lispro (HUMALOG) 100 UNIT/ML injection Inject subcutaneously 50  units every day via pump 50 mL 2  . levothyroxine (SYNTHROID, LEVOTHROID) 137 MCG tablet Take 1 tablet (137 mcg total) by mouth daily before breakfast. 30 tablet 11  . magnesium oxide (MAG-OX) 400 MG tablet Take 2 tablets once daily     . vitamin E 400 UNIT capsule Take 2 capsules by mouth twice a day as needed      No current facility-administered medications on file prior to visit.    Allergies  Allergen Reactions  .  Atorvastatin     REACTION: perceived myalgias  . Ciprofloxacin     REACTION: pt states med  make her sick  . Epinephrine     REACTION: thyroid problems  . Erythromycin   . Morphine   . Penicillins     REACTION: hives    Family History  Problem Relation Age of Onset  . Cancer Mother     Breast Cancer  . Diabetes Mother   . Diabetes Father     BP 146/84 mmHg  Pulse 84  Temp(Src) 98.1 F (36.7 C) (Oral)  Ht 5\' 4"  (1.626 m)  Wt 145 lb (65.772 kg)  BMI 24.88 kg/m2  SpO2 97%  Review of Systems She denies hypoglycemia    Objective:   Physical Exam VITAL SIGNS:  See vs page GENERAL: no distress Pulses: dorsalis pedis intact bilat.   MSK: no deformity of the feet.   CV: trace bilat leg edema.   Skin: no ulcer on the feet. normal temp on the feet, but there is patchy hyperpigmentation.  Neuro: sensation is intact to touch on the feet, but decreased from normal    A1c=11.2%    Assessment & Plan:  HTN: worse, therapy limited by noncompliance: she declines to change to hyzaar.  I advised her of risks of inadequate control of DM.   DM: ongoing poor control.  I advised her to d/c pump, but she declines.    Patient is advised the following: Patient Instructions  Please increase your basal rate to 0.5 units/hr, 24 hrs per day.   Please continue your standard mealtime bolus of 2 units per meal.   continue correction bolus (which some people call "sensitivity," or "insulin sensitivity ratio," or just "isr") of 1 unit for each 100 by which your glucose exceeds 100.  check your blood sugar 4 times a day: the 3 meals, and at bedtime.  also check if you have symptoms of your blood sugar being too high or too low.  please keep a record of the readings and bring it to your next appointment here (or you can bring the meter itself).  You can write it on any piece of paper.  please call us sooner if your blood sugar goes below 70, or if you have a lot of readings over 200.  Please  come back for a regular physical appointment in 3 months.

## 2015-04-14 NOTE — Patient Instructions (Addendum)
Please increase your basal rate to 0.5 units/hr, 24 hrs per day.   Please continue your standard mealtime bolus of 2 units per meal.   continue correction bolus (which some people call "sensitivity," or "insulin sensitivity ratio," or just "isr") of 1 unit for each 100 by which your glucose exceeds 100.  check your blood sugar 4 times a day: the 3 meals, and at bedtime.  also check if you have symptoms of your blood sugar being too high or too low.  please keep a record of the readings and bring it to your next appointment here (or you can bring the meter itself).  You can write it on any piece of paper.  please call us sooner if your blood sugar goes below 70, or if you have a lot of readings over 200.  Please come back for a regular physical appointment in 3 months.

## 2015-04-15 ENCOUNTER — Telehealth: Payer: Self-pay | Admitting: Endocrinology

## 2015-04-15 MED ORDER — AMILORIDE-HYDROCHLOROTHIAZIDE 5-50 MG PO TABS: ORAL_TABLET | ORAL | Status: DC

## 2015-04-15 MED ORDER — FLUTICASONE PROPIONATE 50 MCG/ACT NA SUSP: 2.0000 | Freq: Every day | NASAL | Status: DC

## 2015-04-15 NOTE — Telephone Encounter (Signed)
Rx sent per pt's request.  

## 2015-04-15 NOTE — Telephone Encounter (Signed)
Patient need refill of, amiloride-hydrochlorothiazide (MODURETIC) 5-50 MG tablet,send to  [facility] - [place], [place] - [address] [phone] (Phone) [phone] (Fax)

## 2015-04-17 ENCOUNTER — Ambulatory Visit
Admission: RE | Admit: 2015-04-17 | Discharge: 2015-04-17 | Disposition: A | Discharge status: Home / Self Care | Payer: 59 | Source: Ambulatory Visit

## 2015-04-17 ENCOUNTER — Ambulatory Visit: Payer: 59

## 2015-04-17 DIAGNOSIS — Z1231 Encounter for screening mammogram for malignant neoplasm of breast: Secondary | ICD-10-CM

## 2015-05-21 ENCOUNTER — Other Ambulatory Visit: Payer: Self-pay | Admitting: Endocrinology

## 2015-06-10 ENCOUNTER — Other Ambulatory Visit: Payer: Self-pay | Admitting: Obstetrics and Gynecology

## 2015-06-24 ENCOUNTER — Other Ambulatory Visit: Payer: Self-pay | Admitting: Endocrinology

## 2015-07-15 ENCOUNTER — Ambulatory Visit (INDEPENDENT_AMBULATORY_CARE_PROVIDER_SITE_OTHER): Payer: 59 | Admitting: Endocrinology

## 2015-07-15 ENCOUNTER — Other Ambulatory Visit: Payer: Self-pay

## 2015-07-15 VITALS — BP 122/82 | HR 88 | Temp 97.9°F | Wt 142.0 lb

## 2015-07-15 DIAGNOSIS — E1021 Type 1 diabetes mellitus with diabetic nephropathy: Secondary | ICD-10-CM | POA: Diagnosis not present

## 2015-07-15 DIAGNOSIS — R209 Unspecified disturbances of skin sensation: Secondary | ICD-10-CM | POA: Diagnosis not present

## 2015-07-15 DIAGNOSIS — Z0000 Encounter for general adult medical examination without abnormal findings: Secondary | ICD-10-CM

## 2015-07-15 DIAGNOSIS — Z0189 Encounter for other specified special examinations: Secondary | ICD-10-CM | POA: Diagnosis not present

## 2015-07-15 DIAGNOSIS — Z119 Encounter for screening for infectious and parasitic diseases, unspecified: Secondary | ICD-10-CM | POA: Insufficient documentation

## 2015-07-15 LAB — MICROALBUMIN / CREATININE URINE RATIO
Creatinine,U: 76.1 mg/dL
Microalb Creat Ratio: 2.6 mg/g (ref 0.0–30.0)
Microalb, Ur: 2 mg/dL — ABNORMAL HIGH (ref 0.0–1.9)

## 2015-07-15 LAB — CBC WITH DIFFERENTIAL/PLATELET
BASOS PCT: 0.5 % (ref 0.0–3.0)
Basophils Absolute: 0 10*3/uL (ref 0.0–0.1)
EOS PCT: 2.1 % (ref 0.0–5.0)
Eosinophils Absolute: 0.1 10*3/uL (ref 0.0–0.7)
HEMATOCRIT: 41.8 % (ref 36.0–46.0)
HEMOGLOBIN: 13.6 g/dL (ref 12.0–15.0)
LYMPHS PCT: 33.2 % (ref 12.0–46.0)
Lymphs Abs: 1.7 10*3/uL (ref 0.7–4.0)
MCHC: 32.5 g/dL (ref 30.0–36.0)
MCV: 82.8 fl (ref 78.0–100.0)
Monocytes Absolute: 0.4 10*3/uL (ref 0.1–1.0)
Monocytes Relative: 7.9 % (ref 3.0–12.0)
Neutro Abs: 2.8 10*3/uL (ref 1.4–7.7)
Neutrophils Relative %: 56.3 % (ref 43.0–77.0)
Platelets: 268 10*3/uL (ref 150.0–400.0)
RBC: 5.05 Mil/uL (ref 3.87–5.11)
RDW: 13.5 % (ref 11.5–15.5)
WBC: 5 10*3/uL (ref 4.0–10.5)

## 2015-07-15 LAB — URINALYSIS, ROUTINE W REFLEX MICROSCOPIC
Bilirubin Urine: NEGATIVE
Ketones, ur: NEGATIVE
Nitrite: NEGATIVE
SPECIFIC GRAVITY, URINE: 1.015 (ref 1.000–1.030)
TOTAL PROTEIN, URINE-UPE24: NEGATIVE
UROBILINOGEN UA: 0.2 (ref 0.0–1.0)
pH: 5.5 (ref 5.0–8.0)

## 2015-07-15 LAB — LIPID PANEL
CHOLESTEROL: 276 mg/dL — AB (ref 0–200)
HDL: 64.2 mg/dL (ref >39.00)
LDL Cholesterol: 182 mg/dL — ABNORMAL HIGH (ref 0–99)
NONHDL: 211.94
Total CHOL/HDL Ratio: 4
Triglycerides: 150 mg/dL — ABNORMAL HIGH (ref 0.0–149.0)
VLDL: 30 mg/dL (ref 0.0–40.0)

## 2015-07-15 LAB — BASIC METABOLIC PANEL
BUN: 31 mg/dL — AB (ref 6–23)
CO2: 33 meq/L — AB (ref 19–32)
Calcium: 9.6 mg/dL (ref 8.4–10.5)
Chloride: 96 mEq/L (ref 96–112)
Creatinine, Ser: 0.99 mg/dL (ref 0.40–1.20)
GFR: 74.11 mL/min (ref >60.00)
GLUCOSE: 298 mg/dL — AB (ref 70–99)
POTASSIUM: 3.8 meq/L (ref 3.5–5.1)
SODIUM: 135 meq/L (ref 135–145)

## 2015-07-15 LAB — HEPATIC FUNCTION PANEL
ALT: 14 U/L (ref 0–35)
AST: 17 U/L (ref 0–37)
Albumin: 3.8 g/dL (ref 3.5–5.2)
Alkaline Phosphatase: 115 U/L (ref 39–117)
BILIRUBIN DIRECT: 0 mg/dL (ref 0.0–0.3)
BILIRUBIN TOTAL: 0.3 mg/dL (ref 0.2–1.2)
Total Protein: 7.2 g/dL (ref 6.0–8.3)

## 2015-07-15 LAB — POCT GLYCOSYLATED HEMOGLOBIN (HGB A1C): HEMOGLOBIN A1C: 12.1

## 2015-07-15 LAB — TSH: TSH: 0.78 u[IU]/mL (ref 0.35–4.50)

## 2015-07-15 LAB — VITAMIN B12: Vitamin B-12: 1294 pg/mL — ABNORMAL HIGH (ref 211–911)

## 2015-07-15 MED ORDER — AMILORIDE-HYDROCHLOROTHIAZIDE 5-50 MG PO TABS: 0.5000 | ORAL_TABLET | Freq: Every day | ORAL | Status: DC

## 2015-07-15 MED ORDER — LEVOTHYROXINE SODIUM 137 MCG PO TABS: 137.0000 ug | ORAL_TABLET | Freq: Every day | ORAL | Status: DC

## 2015-07-15 MED ORDER — INSULIN LISPRO 100 UNIT/ML ~~LOC~~ SOLN: SUBCUTANEOUS | Status: DC

## 2015-07-15 MED ORDER — "PARADIGM QUICK-SET 43"" 9MM MISC": Status: DC

## 2015-07-15 MED ORDER — PARADIGM PUMP RESERVOIR 3ML MISC: Status: DC

## 2015-07-15 MED ORDER — FLUTICASONE PROPIONATE 50 MCG/ACT NA SUSP: 2.0000 | Freq: Every day | NASAL | Status: DC

## 2015-07-15 NOTE — Progress Notes (Signed)
Subjective:    Patient ID: Katherine Navarro, female    DOB: 08-Nov-1955, 60 y.o.   MRN: [id number]  HPI Pt is here for regular wellness examination, and is feeling pretty well in general, and says chronic med probs are stable, except as noted below Past Medical History  Diagnosis Date  . DIABETES MELLITUS, TYPE I 01/07/2007  . Unspecified hypothyroidism 12/13/2007  . HYPERCHOLESTEROLEMIA 12/13/2007  . LEUKOPENIA, CHRONIC 12/13/2007  . Nonproliferative diabetic retinopathy [id number]) 12/13/2007  . GLAUCOMA 12/13/2007  . HYPERTENSION 01/07/2007  . CONSTIPATION, CHRONIC 12/13/2007  . INTERNAL HEMORRHOIDS 07/23/2008  . VARICOSE VEINS, LOWER EXTREMITIES 12/13/2007  . Hyperthyroidism   . DM nephropathy/sclerosis     Past Surgical History  Procedure Laterality Date  . Tubal ligation    . Eye surgery      bilateral  . Stress myoview   05/18/2004  . Electrocardiogram  08/24/2006  . Leep N/A 12/01/2012    Procedure: LOOP ELECTROSURGICAL EXCISION PROCEDURE (LEEP);  Surgeon: Delice Lesch, MD;  Location: [facility];  Service: Gynecology;  Laterality: N/A;    Social History   Social History  . Marital Status: Married    Spouse Name: N/A  . Number of Children: N/A  . Years of Education: N/A   Occupational History  .      Doesn't work outside the home   Social History Main Topics  . Smoking status: Never Smoker   . Smokeless tobacco: Not on file  . Alcohol Use: No  . Drug Use: No  . Sexual Activity: Not on file   Other Topics Concern  . Not on file   Social History Narrative   Pt gets regular exercise.          Current Outpatient Prescriptions on File Prior to Visit  Medication Sig Dispense Refill  . alum & mag hydroxide-simeth (MYLANTA) 200-200-20 MG/5ML suspension as needed.      Marland Kitchen BAYER CONTOUR NEXT TEST test strip USE AS INSTRUCTED 10 TIMES A DAY 900 strip 1  . Cod Liver Oil CAPS Take 1 capsule by mouth daily.      . colestipol (COLESTID) 5 G granules Take 5 g by mouth  daily. 500 g 1  . dorzolamide-timolol (COSOPT) 22.3-6.8 MG/ML ophthalmic solution 1 drop 2 (two) times daily.    . Glucosamine 500 MG TABS Take 1 tablet by mouth daily.      . hydrocortisone-pramoxine (ANALPRAM-HC) 2.5-1 % rectal cream Place rectally as needed for hemorrhoids. Apply to rectum as needed 30 g 1  . ibuprofen (ADVIL,MOTRIN) 600 MG tablet Take 1 tablet (600 mg total) by mouth every 6 (six) hours as needed for pain. 30 tablet 0  . magnesium oxide (MAG-OX) 400 MG tablet Take 2 tablets once daily     . ONE TOUCH ULTRA TEST test strip Check blood sugar 12 times  a day 600 each 2  . vitamin E 400 UNIT capsule Take 2 capsules by mouth twice a day as needed      No current facility-administered medications on file prior to visit.    Allergies  Allergen Reactions  . Atorvastatin     REACTION: perceived myalgias  . Ciprofloxacin     REACTION: pt states med  make her sick  . Epinephrine     REACTION: thyroid problems  . Erythromycin   . Morphine   . Penicillins     REACTION: hives    Family History  Problem Relation Age of Onset  . Cancer Mother  Breast Cancer  . Diabetes Mother   . Diabetes Father     BP 122/82 mmHg  Pulse 88  Temp(Src) 97.9 F (36.6 C) (Oral)  Wt 142 lb (64.411 kg)  SpO2 97%   Review of Systems  Constitutional: Negative for fever.  HENT: Negative for hearing loss.   Eyes: Negative for visual disturbance.  Respiratory: Negative for shortness of breath.   Cardiovascular: Negative for chest pain.  Gastrointestinal: Negative for anal bleeding.  Endocrine: Negative for cold intolerance.  Genitourinary: Negative for hematuria.  Musculoskeletal: Negative for back pain.  Skin: Negative for rash.  Allergic/Immunologic: Positive for environmental allergies.  Neurological: Negative for numbness.  Hematological: Does not bruise/bleed easily.  Psychiatric/Behavioral: Negative for dysphoric mood.       Objective:   Physical Exam VS: see vs  page GEN: no distress HEAD: head: no deformity eyes: no periorbital swelling. bilat proptosis is noted external nose and ears are normal mouth: no lesion seen NECK: supple, thyroid is not enlarged CHEST WALL: no deformity LUNGS:  Clear to auscultation BREASTS: sees gyn CV: reg rate and rhythm, no murmur ABD: abdomen is soft, nontender.  no hepatosplenomegaly.  not distended.  no hernia GENITALIA/RECTAL: sees gyn MUSCULOSKELETAL: muscle bulk and strength are grossly normal.  no obvious joint swelling.  gait is normal and steady PULSES: no carotid bruit NEURO:  cn 2-12 grossly intact.   readily moves all 4's. SKIN:  Normal texture and temperature.  No rash or suspicious lesion is visible.   NODES:  None palpable at the neck PSYCH: alert, well-oriented.  Does not appear anxious nor depressed.      Assessment & Plan:  Wellness visit today, with problems stable, except as noted.   SEPARATE EVALUATION FOLLOWS--EACH PROBLEM HERE IS NEW, NOT RESPONDING TO TREATMENT, OR POSES SIGNIFICANT RISK TO THE PATIENT'S HEALTH: HISTORY OF THE PRESENT ILLNESS: Pt returns for f/u of diabetes mellitus:   DM type: 1  Dx'ed: 1978.   Complications: polyneuropathy, retinopathy and nephropathy.  Therapy: insulin since dx GDM: never DKA: never Severe hypoglycemia: once (1988) Pancreatitis: never.  Other: on pump rx since approx 2005; therapy has been limited by noncompliance with cbg recording, and dosing via her pump.  Interval history: She takes these pump settings:  basal rate of 0.4 units/hr.   mealtime bolus of 2 units (however, she still takes a widely varying bolus).   correction bolus (which some people call "sensitivity," or "insulin sensitivity ratio," or just "isr") of 1 unit for each 50 by which your glucose exceeds 100.   no cbg record, but states cbg's are still usually high.  She averages a total of approx 40 units/day.   no cbg record, but states cbg's are often low during the day,  with activity.  It is in general higher as the day goes on.   PAST MEDICAL HISTORY reviewed and up to date today REVIEW OF SYSTEMS: Denies LOC and weight change. PHYSICAL EXAMINATION: VITAL SIGNS:  See vs page GENERAL: no distress Pulses: dorsalis pedis intact bilat.   MSK: no deformity of the feet.   CV: trace bilat leg edema.   Skin: no ulcer on the feet. normal temp on the feet, but there is patchy hyperpigmentation.  Neuro: sensation is intact to touch on the feet, but decreased from normal  LAB/XRAY RESULTS: i personally reviewed electrocardiogram tracing (today): Indication: DM Impression: short PR A1c=12.1% UA pos for UTI IMPRESSION: UTI DM: ongoing poor control Dyslipidemia: therapy is limited by multiple perceived drug  intolerances PLAN: i told pt no rx needed unless UTI sxs i advised low-chol diet Please continue your basal rate of 0.5 units/hr, 24 hrs per day.   Please increase your standard mealtime bolus to 4 units per meal.   continue correction bolus (which some people call "sensitivity," or "insulin sensitivity ratio," or just "isr") of 1 unit for each 100 by which your glucose exceeds 100.  If you are going to be active, suspend the pump for 1-2 hrs.   check your blood sugar 4 times a day: the 3 meals, and at bedtime.  also check if you have symptoms of your blood sugar being too high or too low.  please keep a record of the readings and bring it to your next appointment here (or you can bring the meter itself).  You can write it on any piece of paper.  please call us sooner if your blood sugar goes below 70, or if you have a lot of readings over 200.

## 2015-07-15 NOTE — Patient Instructions (Addendum)
Please continue your basal rate of 0.5 units/hr, 24 hrs per day.   Please increase your standard mealtime bolus to 4 units per meal.   continue correction bolus (which some people call "sensitivity," or "insulin sensitivity ratio," or just "isr") of 1 unit for each 100 by which your glucose exceeds 100.  If you are going to be active, suspend the pump for 1-2 hrs.   check your blood sugar 4 times a day: the 3 meals, and at bedtime.  also check if you have symptoms of your blood sugar being too high or too low.  please keep a record of the readings and bring it to your next appointment here (or you can bring the meter itself).  You can write it on any piece of paper.  please call us sooner if your blood sugar goes below 70, or if you have a lot of readings over 200.  Please come back for a follow-up appointment in 2 months.   please consider these measures for your health:  minimize alcohol.  do not use tobacco products.  have a colonoscopy at least every 10 years from age 74.  Women should have an annual mammogram from age 73.  keep firearms safely stored.  always use seat belts.  have working smoke alarms in your home.  see an eye doctor and dentist regularly.  never drive under the influence of alcohol or drugs (including prescription drugs).

## 2015-07-16 LAB — HEPATITIS C ANTIBODY: HCV AB: NEGATIVE

## 2015-07-16 LAB — HIV ANTIBODY (ROUTINE TESTING W REFLEX): HIV 1&2 Ab, 4th Generation: NONREACTIVE

## 2015-07-17 ENCOUNTER — Other Ambulatory Visit: Payer: Self-pay | Admitting: Endocrinology

## 2015-07-17 LAB — FRUCTOSAMINE: Fructosamine: 538 umol/L — ABNORMAL HIGH (ref 190–270)

## 2015-07-17 MED ORDER — NITROFURANTOIN MACROCRYSTAL 50 MG PO CAPS: 50.0000 mg | ORAL_CAPSULE | Freq: Three times a day (TID) | ORAL | Status: DC

## 2015-07-18 ENCOUNTER — Telehealth: Payer: Self-pay | Admitting: Endocrinology

## 2015-07-18 MED ORDER — PARADIGM PUMP RESERVOIR 3ML MISC: Status: DC

## 2015-07-18 MED ORDER — "PARADIGM QUICK-SET 43"" 9MM MISC": Status: DC

## 2015-07-18 MED ORDER — LEVOTHYROXINE SODIUM 137 MCG PO TABS: 137.0000 ug | ORAL_TABLET | Freq: Every day | ORAL | Status: DC

## 2015-07-18 NOTE — Telephone Encounter (Signed)
Rx re submitted to [facility].

## 2015-07-18 NOTE — Telephone Encounter (Signed)
Patient stated that her meds were sent to the wrong  Pharmacy, sen medication ONE TOUCH ULTRA TEST test strip, Insulin Infusion Pump Supplies (PARADIGM RESERVOIR 3ML), levothyroxine (SYNTHROID, LEVOTHROID) 137 MCG tablet send to [facility]

## 2015-07-21 ENCOUNTER — Telehealth: Payer: Self-pay | Admitting: Endocrinology

## 2015-07-21 NOTE — Telephone Encounter (Signed)
Patient ask you to give her a call °

## 2015-07-21 NOTE — Telephone Encounter (Signed)
I contacted the pt. Pt wanted to verify her pump supplies had been sent to [facility]. Rx has been sent and pt voiced understanding.

## 2015-07-26 ENCOUNTER — Other Ambulatory Visit: Payer: Self-pay | Admitting: Endocrinology

## 2015-07-29 DIAGNOSIS — E0500 Thyrotoxicosis with diffuse goiter without thyrotoxic crisis or storm: Secondary | ICD-10-CM | POA: Insufficient documentation

## 2015-08-05 ENCOUNTER — Telehealth: Payer: Self-pay | Admitting: Endocrinology

## 2015-08-05 NOTE — Telephone Encounter (Signed)
please call patient: Ov needed for preop

## 2015-08-05 NOTE — Telephone Encounter (Signed)
I contacted the pt and advised of note below. Pt scheduled for 08/07/2015.

## 2015-08-07 ENCOUNTER — Ambulatory Visit (INDEPENDENT_AMBULATORY_CARE_PROVIDER_SITE_OTHER): Payer: 59 | Admitting: Endocrinology

## 2015-08-07 ENCOUNTER — Encounter: Payer: Self-pay | Admitting: Endocrinology

## 2015-08-07 VITALS — BP 126/74 | HR 80 | Temp 98.2°F | Ht 64.0 in | Wt 143.0 lb

## 2015-08-07 DIAGNOSIS — E1021 Type 1 diabetes mellitus with diabetic nephropathy: Secondary | ICD-10-CM

## 2015-08-07 NOTE — Patient Instructions (Addendum)
Please continue your basal rate of 0.5 units/hr, 24 hrs per day.   Please continue your standard mealtime bolus of 2 units per meal.   continue correction bolus (which some people call "sensitivity," or "insulin sensitivity ratio," or just "isr") of 1 unit for each 100 by which your glucose exceeds 100.  If you are going to be active, suspend the pump for 1-2 hrs.   check your blood sugar 4 times a day: the 3 meals, and at bedtime.  also check if you have symptoms of your blood sugar being too high or too low.  please keep a record of the readings and bring it to your next appointment here (or you can bring the meter itself).  You can write it on any piece of paper.  please call us sooner if your blood sugar goes below 70, or if you have a lot of readings over 200.   On the morning of surgery, please skip your breakfast bolus, and take just correction boluses.   After the surgery, please resume your usual meals and pump settings.   Please come back in April as scheduled.

## 2015-08-07 NOTE — Progress Notes (Signed)
Subjective:    Patient ID: Katherine Navarro, female    DOB: February 13, 1956, 60 y.o.   MRN: [id number]  HPI Pt returns for f/u of diabetes mellitus:   DM type: 1  Dx'ed: 1978.   Complications: polyneuropathy, retinopathy and nephropathy.  Therapy: insulin since dx GDM: never DKA: never Severe hypoglycemia: once (1988) Pancreatitis: never.  Other: on pump rx since approx 2005; therapy has been limited by noncompliance with cbg recording, and dosing via her pump.  Interval history: She takes these pump settings:  basal rate of 0.5 units/hr.   mealtime bolus of 2 units (however, she still takes a widely varying bolus).   correction bolus (which some people call "sensitivity," or "insulin sensitivity ratio," or just "isr") of 1 unit for each 50 by which your glucose exceeds 100.   She averages a total of approx 40 units/day.   no cbg record, but states cbg's are often low in the middle of the night, or with activity.  It is in general higher as the day goes on.   She has GYN surgery upcoming.  She will take clear liquids the am of surgery.   Past Medical History  Diagnosis Date  . DIABETES MELLITUS, TYPE I 01/07/2007  . Unspecified hypothyroidism 12/13/2007  . HYPERCHOLESTEROLEMIA 12/13/2007  . LEUKOPENIA, CHRONIC 12/13/2007  . Nonproliferative diabetic retinopathy [id number]) 12/13/2007  . GLAUCOMA 12/13/2007  . HYPERTENSION 01/07/2007  . CONSTIPATION, CHRONIC 12/13/2007  . INTERNAL HEMORRHOIDS 07/23/2008  . VARICOSE VEINS, LOWER EXTREMITIES 12/13/2007  . Hyperthyroidism   . DM nephropathy/sclerosis     Past Surgical History  Procedure Laterality Date  . Tubal ligation    . Eye surgery      bilateral  . Stress myoview   05/18/2004  . Electrocardiogram  08/24/2006  . Leep N/A 12/01/2012    Procedure: LOOP ELECTROSURGICAL EXCISION PROCEDURE (LEEP);  Surgeon: Delice Lesch, MD;  Location: [facility];  Service: Gynecology;  Laterality: N/A;    Social History   Social History  . Marital  Status: Married    Spouse Name: N/A  . Number of Children: N/A  . Years of Education: N/A   Occupational History  .      Doesn't work outside the home   Social History Main Topics  . Smoking status: Never Smoker   . Smokeless tobacco: Not on file  . Alcohol Use: No  . Drug Use: No  . Sexual Activity: Not on file   Other Topics Concern  . Not on file   Social History Narrative   Pt gets regular exercise.          Current Outpatient Prescriptions on File Prior to Visit  Medication Sig Dispense Refill  . alum & mag hydroxide-simeth (MYLANTA) 200-200-20 MG/5ML suspension as needed.      Marland Kitchen amiloride-hydrochlorothiazide (MODURETIC) 5-50 MG tablet Take 0.5 tablets by mouth daily. 45 tablet 2  . BAYER CONTOUR NEXT TEST test strip USE AS INSTRUCTED 10 TIMES A DAY 900 strip 1  . Cod Liver Oil CAPS Take 1 capsule by mouth daily.      . colestipol (COLESTID) 5 G granules Take 5 g by mouth daily. 500 g 1  . dorzolamide-timolol (COSOPT) 22.3-6.8 MG/ML ophthalmic solution 1 drop 2 (two) times daily.    . fluticasone (FLONASE) 50 MCG/ACT nasal spray Place 2 sprays into both nostrils daily. 16 g 6  . Glucosamine 500 MG TABS Take 1 tablet by mouth daily.      Marland Kitchen HUMALOG  100 UNIT/ML injection Inject subcutaneously 50  units every day via pump 50 mL 2  . hydrocortisone-pramoxine (ANALPRAM-HC) 2.5-1 % rectal cream Place rectally as needed for hemorrhoids. Apply to rectum as needed 30 g 1  . ibuprofen (ADVIL,MOTRIN) 600 MG tablet Take 1 tablet (600 mg total) by mouth every 6 (six) hours as needed for pain. 30 tablet 0  . Insulin Infusion Pump Supplies (MINIMED INFUSION SET-MMT 396) MISC CHANGE EVERY 2 DAYS 50 each 3  . Insulin Infusion Pump Supplies (PARADIGM RESERVOIR 3ML) MISC CHANGE EVERY 2 DAYS 50 each 3  . insulin lispro (HUMALOG) 100 UNIT/ML injection Inject subcutaneously 50  units every day via pump 50 mL 2  . levothyroxine (SYNTHROID, LEVOTHROID) 137 MCG tablet Take 1 tablet (137 mcg  total) by mouth daily before breakfast. 90 tablet 2  . magnesium oxide (MAG-OX) 400 MG tablet Take 2 tablets once daily     . nitrofurantoin (MACRODANTIN) 50 MG capsule Take 1 capsule (50 mg total) by mouth 3 (three) times daily. 21 capsule 0  . ONE TOUCH ULTRA TEST test strip Check blood sugar 12 times  a day 600 each 2  . vitamin E 400 UNIT capsule Take 2 capsules by mouth twice a day as needed      No current facility-administered medications on file prior to visit.    Allergies  Allergen Reactions  . Atorvastatin     REACTION: perceived myalgias  . Ciprofloxacin     REACTION: pt states med  make her sick  . Epinephrine     REACTION: thyroid problems  . Erythromycin   . Morphine   . Penicillins     REACTION: hives    Family History  Problem Relation Age of Onset  . Cancer Mother     Breast Cancer  . Diabetes Mother   . Diabetes Father     BP 126/74 mmHg  Pulse 80  Temp(Src) 98.2 F (36.8 C) (Oral)  Ht 5\' 4"  (1.626 m)  Wt 143 lb (64.864 kg)  BMI 24.53 kg/m2  SpO2 97%  Review of Systems Denies LOC.      Objective:   Physical Exam VITAL SIGNS:  See vs page.   GENERAL: no distress.  SKIN:  Insulin infusion sites at the anterior abdomen are normal.        Assessment & Plan:  Noncompliance with cbg recording and insulin dosing: I told patient i can't safely make any changes until I see cbg record.   However, her surgical risk is low and outweighed by the potential benefit of the surgery.  she is therefore medically cleared.  Patient is advised the following: Patient Instructions  Please continue your basal rate of 0.5 units/hr, 24 hrs per day.   Please continue your standard mealtime bolus of 2 units per meal.   continue correction bolus (which some people call "sensitivity," or "insulin sensitivity ratio," or just "isr") of 1 unit for each 100 by which your glucose exceeds 100.  If you are going to be active, suspend the pump for 1-2 hrs.   check your blood  sugar 4 times a day: the 3 meals, and at bedtime.  also check if you have symptoms of your blood sugar being too high or too low.  please keep a record of the readings and bring it to your next appointment here (or you can bring the meter itself).  You can write it on any piece of paper.  please call us sooner if your blood  sugar goes below 70, or if you have a lot of readings over 200.   On the morning of surgery, please skip your breakfast bolus, and take just correction boluses.   After the surgery, please resume your usual meals and pump settings.   Please come back in April as scheduled.

## 2015-08-18 ENCOUNTER — Telehealth: Payer: Self-pay | Admitting: Endocrinology

## 2015-08-18 NOTE — Telephone Encounter (Signed)
please call Dr Robert's office: The info requested is in the impression and plan of my 08/07/15 note Is there anything else you need?

## 2015-08-19 NOTE — Telephone Encounter (Signed)
I contacted Dr. Rose Fillers office and left a voicemail for her nurse advising of note below. Requested a call back to discuss if need be.

## 2015-08-20 ENCOUNTER — Other Ambulatory Visit: Payer: Self-pay

## 2015-08-20 MED ORDER — GLUCOSE BLOOD VI STRP: ORAL_STRIP | Status: DC

## 2015-09-05 ENCOUNTER — Other Ambulatory Visit: Payer: Self-pay | Admitting: Obstetrics and Gynecology

## 2015-09-15 ENCOUNTER — Encounter (HOSPITAL_COMMUNITY): Payer: Self-pay

## 2015-09-16 ENCOUNTER — Ambulatory Visit: Payer: 59 | Admitting: Endocrinology

## 2015-10-10 ENCOUNTER — Ambulatory Visit: Payer: 59 | Admitting: Endocrinology

## 2015-11-04 ENCOUNTER — Other Ambulatory Visit: Payer: Self-pay | Admitting: Endocrinology

## 2015-11-04 NOTE — Telephone Encounter (Signed)
Patient ask you to call her. °

## 2015-11-05 ENCOUNTER — Other Ambulatory Visit: Payer: Self-pay

## 2015-11-05 ENCOUNTER — Telehealth: Payer: Self-pay | Admitting: Endocrinology

## 2015-11-05 MED ORDER — GLUCOSE BLOOD VI STRP: ORAL_STRIP | Status: DC

## 2015-11-05 MED ORDER — PARADIGM PUMP RESERVOIR 3ML MISC: Status: DC

## 2015-11-05 MED ORDER — "PARADIGM QUICK-SET 43"" 9MM MISC": Status: DC

## 2015-11-05 MED ORDER — INSULIN LISPRO 100 UNIT/ML ~~LOC~~ SOLN: SUBCUTANEOUS | Status: DC

## 2015-11-05 NOTE — Telephone Encounter (Signed)
PT requests a call back from you

## 2015-11-05 NOTE — Telephone Encounter (Signed)
Requested a call back from the pt to discuss.  

## 2015-11-10 ENCOUNTER — Ambulatory Visit: Payer: 59 | Admitting: Endocrinology

## 2015-12-04 DIAGNOSIS — N8500 Endometrial hyperplasia, unspecified: Secondary | ICD-10-CM | POA: Insufficient documentation

## 2016-01-01 ENCOUNTER — Other Ambulatory Visit: Payer: Self-pay | Admitting: Endocrinology

## 2016-01-02 ENCOUNTER — Other Ambulatory Visit: Payer: Self-pay | Admitting: Obstetrics and Gynecology

## 2016-01-16 ENCOUNTER — Ambulatory Visit (INDEPENDENT_AMBULATORY_CARE_PROVIDER_SITE_OTHER): Payer: 59 | Admitting: Endocrinology

## 2016-01-16 ENCOUNTER — Encounter: Payer: Self-pay | Admitting: Endocrinology

## 2016-01-16 VITALS — BP 130/70 | HR 90 | Wt 145.8 lb

## 2016-01-16 DIAGNOSIS — E108 Type 1 diabetes mellitus with unspecified complications: Secondary | ICD-10-CM

## 2016-01-16 LAB — POCT GLYCOSYLATED HEMOGLOBIN (HGB A1C): HEMOGLOBIN A1C: 11.5

## 2016-01-16 NOTE — Progress Notes (Signed)
Subjective:    Patient ID: Katherine Navarro, female    DOB: 07/16/1955, 60 y.o.   MRN: [id number]  HPI Pt returns for f/u of diabetes mellitus:   DM type: 1  Dx'ed: 1978.   Complications: polyneuropathy, retinopathy and nephropathy.  Therapy: insulin since dx GDM: never DKA: never Severe hypoglycemia: once (1988) Pancreatitis: never.  Other: on pump rx since approx 2005; therapy has been limited by noncompliance with cbg recording, and dosing via her pump.  Interval history: She takes these pump settings:  basal rate of 0.5 units/hr.   mealtime bolus of 2 units per meal.  (however, she still takes a widely varying bolus).   correction bolus (which some people call "sensitivity," or "insulin sensitivity ratio," or just "isr") of 1 unit for each 50 by which your glucose exceeds 100.   She averages a total of approx 40 units/day.   no cbg record, but states cbg's continue to be extremely variable.  She has mild hypoglycemia approx several times per week. This only happens during the day.   She sleeps from 4 AM-11 AM, and takes frequent naps during the day.  She wants to reduce total insulin per day Past Medical History:  Diagnosis Date  . CONSTIPATION, CHRONIC 12/13/2007  . DIABETES MELLITUS, TYPE I 01/07/2007  . DM nephropathy/sclerosis   . GLAUCOMA 12/13/2007  . HYPERCHOLESTEROLEMIA 12/13/2007  . HYPERTENSION 01/07/2007  . Hyperthyroidism   . INTERNAL HEMORRHOIDS 07/23/2008  . LEUKOPENIA, CHRONIC 12/13/2007  . Nonproliferative diabetic retinopathy [id number]) 12/13/2007  . Unspecified hypothyroidism 12/13/2007  . VARICOSE VEINS, LOWER EXTREMITIES 12/13/2007    Past Surgical History:  Procedure Laterality Date  . ELECTROCARDIOGRAM  08/24/2006  . EYE SURGERY     bilateral  . LEEP N/A 12/01/2012   Procedure: LOOP ELECTROSURGICAL EXCISION PROCEDURE (LEEP);  Surgeon: Delice Lesch, MD;  Location: [facility];  Service: Gynecology;  Laterality: N/A;  . Stress Myoview   05/18/2004  . TUBAL  LIGATION      Social History   Social History  . Marital status: Married    Spouse name: N/A  . Number of children: N/A  . Years of education: N/A   Occupational History  .      Doesn't work outside the home   Social History Main Topics  . Smoking status: Never Smoker  . Smokeless tobacco: Not on file  . Alcohol use No  . Drug use: No  . Sexual activity: Not on file   Other Topics Concern  . Not on file   Social History Narrative   Pt gets regular exercise.          Current Outpatient Prescriptions on File Prior to Visit  Medication Sig Dispense Refill  . alum & mag hydroxide-simeth (MYLANTA) 200-200-20 MG/5ML suspension as needed.      Marland Kitchen amiloride-hydrochlorothiazide (MODURETIC) 5-50 MG tablet Take 0.5 tablets by mouth daily. 45 tablet 2  . Cod Liver Oil CAPS Take 1 capsule by mouth daily.      . colestipol (COLESTID) 5 G granules Take 5 g by mouth daily. 500 g 1  . dorzolamide-timolol (COSOPT) 22.3-6.8 MG/ML ophthalmic solution 1 drop 2 (two) times daily.    . fluticasone (FLONASE) 50 MCG/ACT nasal spray Place 2 sprays into both nostrils daily. 16 g 6  . Glucosamine 500 MG TABS Take 1 tablet by mouth daily.      Marland Kitchen glucose blood (BAYER CONTOUR NEXT TEST) test strip USE AS INSTRUCTED 12 TIMES A DAY 900  each 2  . hydrocortisone-pramoxine (ANALPRAM-HC) 2.5-1 % rectal cream Place rectally as needed for hemorrhoids. Apply to rectum as needed 30 g 1  . ibuprofen (ADVIL,MOTRIN) 600 MG tablet Take 1 tablet (600 mg total) by mouth every 6 (six) hours as needed for pain. 30 tablet 0  . Insulin Infusion Pump Supplies (MINIMED INFUSION SET-MMT 396) MISC CHANGE EVERY 2 DAYS 50 each 3  . Insulin Infusion Pump Supplies (PARADIGM RESERVOIR 3ML) MISC CHANGE EVERY 2 DAYS 50 each 3  . insulin lispro (HUMALOG) 100 UNIT/ML injection Inject subcutaneously 50  units every day via pump 50 mL 2  . insulin lispro (HUMALOG) 100 UNIT/ML injection Inject subcutaneously 50  units every day via pump  50 mL 2  . levothyroxine (SYNTHROID, LEVOTHROID) 137 MCG tablet Take 1 tablet (137 mcg total) by mouth daily before breakfast. 90 tablet 2  . levothyroxine (SYNTHROID, LEVOTHROID) 137 MCG tablet TAKE ONE TABLET BY MOUTH ONCE DAILY BEFORE BREAKFAST 30 tablet 0  . magnesium oxide (MAG-OX) 400 MG tablet Take 2 tablets once daily     . nitrofurantoin (MACRODANTIN) 50 MG capsule Take 1 capsule (50 mg total) by mouth 3 (three) times daily. 21 capsule 0  . vitamin E 400 UNIT capsule Take 2 capsules by mouth twice a day as needed      No current facility-administered medications on file prior to visit.     Allergies  Allergen Reactions  . Atorvastatin     REACTION: perceived myalgias  . Ciprofloxacin     REACTION: pt states med  make her sick  . Epinephrine     REACTION: thyroid problems  . Erythromycin   . Morphine   . Penicillins     REACTION: hives    Family History  Problem Relation Age of Onset  . Cancer Mother     Breast Cancer  . Diabetes Mother   . Diabetes Father     BP 130/70   Pulse 90   Wt 145 lb 12.8 oz (66.1 kg)   SpO2 97%   BMI 25.03 kg/m   Review of Systems She denies LOC.      Objective:   Physical Exam VITAL SIGNS:  See vs page GENERAL: no distress Pulses: dorsalis pedis intact bilat.   MSK: no deformity of the feet.   CV: trace bilat leg edema.   Skin: no ulcer on the feet. normal temp on the feet, but there is patchy hyperpigmentation.  Neuro: sensation is intact to touch on the feet, but decreased from normal.    A1c=11.5%.       Assessment & Plan:  Type 1 DM: ongoing poor control.  We'll try emphasizing the basal rate.    Patient is advised the following: Patient Instructions  Please take a basal rate of 0.5 units/hr from 2 AM-10 AM, and 2 units/hr 10 AM-2 AM.   Please avoid taking a standard mealtime bolus for now.  Take a correction bolus (which some people call "sensitivity," or "insulin sensitivity ratio," or just "isr") of 1 unit  whenever your blood sugar is over 300.   If you are going to be active, suspend the pump for 1-2 hrs.    check your blood sugar 4 times a day: the 3 meals, and at bedtime.  also check if you have symptoms of your blood sugar being too high or too low.  please keep a record of the readings and bring it to your next appointment here (or you can bring the meter  itself).  You can write it on any piece of paper.  please call us sooner if your blood sugar goes below 70, or if you have a lot of readings over 200.   Please come back for a follow-up appointment in 3 months.   Renato Shin, MD

## 2016-01-16 NOTE — Patient Instructions (Addendum)
Please take a basal rate of 0.5 units/hr from 2 AM-10 AM, and 2 units/hr 10 AM-2 AM.   Please avoid taking a standard mealtime bolus for now.  Take a correction bolus (which some people call "sensitivity," or "insulin sensitivity ratio," or just "isr") of 1 unit whenever your blood sugar is over 300.   If you are going to be active, suspend the pump for 1-2 hrs.    check your blood sugar 4 times a day: the 3 meals, and at bedtime.  also check if you have symptoms of your blood sugar being too high or too low.  please keep a record of the readings and bring it to your next appointment here (or you can bring the meter itself).  You can write it on any piece of paper.  please call us sooner if your blood sugar goes below 70, or if you have a lot of readings over 200.   Please come back for a follow-up appointment in 3 months.

## 2016-02-05 ENCOUNTER — Other Ambulatory Visit: Payer: Self-pay | Admitting: Endocrinology

## 2016-02-13 ENCOUNTER — Other Ambulatory Visit: Payer: Self-pay | Admitting: Obstetrics and Gynecology

## 2016-02-13 DIAGNOSIS — Z1231 Encounter for screening mammogram for malignant neoplasm of breast: Secondary | ICD-10-CM

## 2016-03-03 ENCOUNTER — Telehealth: Payer: Self-pay | Admitting: Endocrinology

## 2016-03-03 MED ORDER — LEVOTHYROXINE SODIUM 137 MCG PO TABS: ORAL_TABLET | ORAL | 1 refills | Status: DC

## 2016-03-03 NOTE — Telephone Encounter (Signed)
Need refill of  90 day supply levothyroxine (SYNTHROID, LEVOTHROID) 137 MCG tablet 30 tablet    [place], [place] - [address] [phone] (Phone) [phone] (Fax)

## 2016-03-03 NOTE — Telephone Encounter (Signed)
Refill submitted. 

## 2016-04-06 DIAGNOSIS — H3343 Traction detachment of retina, bilateral: Secondary | ICD-10-CM | POA: Insufficient documentation

## 2016-04-19 ENCOUNTER — Ambulatory Visit
Admission: RE | Admit: 2016-04-19 | Discharge: 2016-04-19 | Disposition: A | Discharge status: Home / Self Care | Payer: 59 | Source: Ambulatory Visit | Attending: Obstetrics and Gynecology | Admitting: Obstetrics and Gynecology

## 2016-04-19 DIAGNOSIS — Z1231 Encounter for screening mammogram for malignant neoplasm of breast: Secondary | ICD-10-CM

## 2016-04-25 ENCOUNTER — Other Ambulatory Visit: Payer: Self-pay | Admitting: Endocrinology

## 2016-05-16 NOTE — Progress Notes (Signed)
Subjective:    Patient ID: Katherine Navarro, female    DOB: 03/02/1956, 60 y.o.   MRN: [id number]  HPI Pt returns for f/u of diabetes mellitus:   DM type: 1  Dx'ed: 1978.   Complications: polyneuropathy, retinopathy and nephropathy.  Therapy: insulin since dx GDM: never DKA: never Severe hypoglycemia: once (1988) Pancreatitis: never.  Other: on pump rx since approx 2005; therapy has been limited by noncompliance with cbg recording, and dosing via her pump; pump settings have been adjusted to emphasize basal rate.  Interval history: She takes these pump settings:  basal of 0.5 units/hr, 24 hrs per day, and: mealtime bolus of 1 unit/15 grams CHO, and: Correction bolus of 1 units for each 50 over 100.   (she did not make adjustments advised at last ov) She sleeps from 4 AM-11 AM, and takes frequent naps during the day.  She wants to reduce total insulin per day no cbg record, but states she seldom has hypoglycemia, and these episodes are mild.  Past Medical History:  Diagnosis Date  . CONSTIPATION, CHRONIC 12/13/2007  . DIABETES MELLITUS, TYPE I 01/07/2007  . DM nephropathy/sclerosis   . GLAUCOMA 12/13/2007  . HYPERCHOLESTEROLEMIA 12/13/2007  . HYPERTENSION 01/07/2007  . Hyperthyroidism   . INTERNAL HEMORRHOIDS 07/23/2008  . LEUKOPENIA, CHRONIC 12/13/2007  . Nonproliferative diabetic retinopathy [id number]) 12/13/2007  . Unspecified hypothyroidism 12/13/2007  . VARICOSE VEINS, LOWER EXTREMITIES 12/13/2007    Past Surgical History:  Procedure Laterality Date  . ELECTROCARDIOGRAM  08/24/2006  . EYE SURGERY     bilateral  . LEEP N/A 12/01/2012   Procedure: LOOP ELECTROSURGICAL EXCISION PROCEDURE (LEEP);  Surgeon: Delice Lesch, MD;  Location: [facility];  Service: Gynecology;  Laterality: N/A;  . Stress Myoview   05/18/2004  . TUBAL LIGATION      Social History   Social History  . Marital status: Married    Spouse name: N/A  . Number of children: N/A  . Years of education: N/A    Occupational History  .      Doesn't work outside the home   Social History Main Topics  . Smoking status: Never Smoker  . Smokeless tobacco: Not on file  . Alcohol use No  . Drug use: No  . Sexual activity: Not on file   Other Topics Concern  . Not on file   Social History Narrative   Pt gets regular exercise.          Current Outpatient Prescriptions on File Prior to Visit  Medication Sig Dispense Refill  . alum & mag hydroxide-simeth (MYLANTA) 200-200-20 MG/5ML suspension as needed.      Marland Kitchen Cod Liver Oil CAPS Take 1 capsule by mouth daily.      . colestipol (COLESTID) 5 G granules Take 5 g by mouth daily. 500 g 1  . dorzolamide-timolol (COSOPT) 22.3-6.8 MG/ML ophthalmic solution 1 drop 2 (two) times daily.    . Glucosamine 500 MG TABS Take 1 tablet by mouth daily.      . hydrocortisone-pramoxine (ANALPRAM-HC) 2.5-1 % rectal cream Place rectally as needed for hemorrhoids. Apply to rectum as needed 30 g 1  . ibuprofen (ADVIL,MOTRIN) 600 MG tablet Take 1 tablet (600 mg total) by mouth every 6 (six) hours as needed for pain. 30 tablet 0  . Insulin Infusion Pump Supplies (MINIMED INFUSION SET-MMT 396) MISC CHANGE EVERY 2 DAYS 50 each 3  . Insulin Infusion Pump Supplies (PARADIGM RESERVOIR 3ML) MISC CHANGE EVERY 2 DAYS 50 each  3  . insulin lispro (HUMALOG) 100 UNIT/ML injection Inject subcutaneously 50  units every day via pump 50 mL 2  . insulin lispro (HUMALOG) 100 UNIT/ML injection Inject subcutaneously 50  units every day via pump 50 mL 2  . magnesium oxide (MAG-OX) 400 MG tablet Take 2 tablets once daily     . nitrofurantoin (MACRODANTIN) 50 MG capsule Take 1 capsule (50 mg total) by mouth 3 (three) times daily. 21 capsule 0  . norethindrone (MICRONOR,CAMILA,ERRIN) 0.35 MG tablet Take 1 tablet by mouth daily.    . ONE TOUCH ULTRA TEST test strip USE AS INSTRUCTED 12 TIMES  A DAY 600 each 3  . vitamin E 400 UNIT capsule Take 2 capsules by mouth twice a day as needed      No  current facility-administered medications on file prior to visit.     Allergies  Allergen Reactions  . Atorvastatin     REACTION: perceived myalgias  . Ciprofloxacin     REACTION: pt states med  make her sick  . Epinephrine     REACTION: thyroid problems  . Erythromycin   . Morphine   . Penicillins     REACTION: hives    Family History  Problem Relation Age of Onset  . Cancer Mother     Breast Cancer  . Diabetes Mother   . Diabetes Father     BP 122/80   Pulse 78   Ht 5\' 4"  (1.626 m)   Wt 150 lb (68 kg)   SpO2 99%   BMI 25.75 kg/m   Review of Systems   She denies LOC    Objective:   Physical Exam VITAL SIGNS:  See vs page GENERAL: no distress Pulses: dorsalis pedis intact bilat.   MSK: no deformity of the feet.   CV: trace bilat leg edema.   Skin: no ulcer on the feet. normal temp on the feet, but there is patchy hyperpigmentation.  Neuro: sensation is intact to touch on the feet, but decreased from normal.     A1c=12.8%    Assessment & Plan:  Type 1 DM, with retinopathy, worse. Noncompliance with cbg recording and pump settings.  We discussed the reasons for the changes--she agrees to change. Patient is advised the following: Patient Instructions  Please take these settings: basal rate of 0.5 units/hr from 2 AM-10 AM, and 2 units/hr 10 AM-2 AM.   No mealtime bolus.   correction bolus (which some people call "sensitivity," or "insulin sensitivity ratio," or just "isr") of 1 unit whenever your blood sugar is over 300.   If you are going to be active, suspend the pump for 1-2 hrs.    check your blood sugar 4 times a day: the 3 meals, and at bedtime.  also check if you have symptoms of your blood sugar being too high or too low.  please keep a record of the readings and bring it to your next appointment here (or you can bring the meter itself).  You can write it on any piece of paper.  please call us sooner if your blood sugar goes below 70, or if you have  a lot of readings over 200.   Please come back for a follow-up appointment in 6 weeks.

## 2016-05-18 ENCOUNTER — Other Ambulatory Visit: Payer: Self-pay

## 2016-05-18 ENCOUNTER — Encounter: Payer: Self-pay | Admitting: Endocrinology

## 2016-05-18 ENCOUNTER — Ambulatory Visit (INDEPENDENT_AMBULATORY_CARE_PROVIDER_SITE_OTHER): Payer: 59 | Admitting: Endocrinology

## 2016-05-18 VITALS — BP 122/80 | HR 78 | Ht 64.0 in | Wt 150.0 lb

## 2016-05-18 DIAGNOSIS — E1021 Type 1 diabetes mellitus with diabetic nephropathy: Secondary | ICD-10-CM | POA: Diagnosis not present

## 2016-05-18 LAB — POCT GLYCOSYLATED HEMOGLOBIN (HGB A1C): HEMOGLOBIN A1C: 12.8

## 2016-05-18 MED ORDER — AMILORIDE-HYDROCHLOROTHIAZIDE 5-50 MG PO TABS: 0.5000 | ORAL_TABLET | Freq: Every day | ORAL | 3 refills | Status: DC

## 2016-05-18 MED ORDER — FLUTICASONE PROPIONATE 50 MCG/ACT NA SUSP: 2.0000 | Freq: Every day | NASAL | 6 refills | Status: DC

## 2016-05-18 NOTE — Patient Instructions (Addendum)
Please take these settings: basal rate of 0.5 units/hr from 2 AM-10 AM, and 2 units/hr 10 AM-2 AM.   No mealtime bolus.   correction bolus (which some people call "sensitivity," or "insulin sensitivity ratio," or just "isr") of 1 unit whenever your blood sugar is over 300.   If you are going to be active, suspend the pump for 1-2 hrs.    check your blood sugar 4 times a day: the 3 meals, and at bedtime.  also check if you have symptoms of your blood sugar being too high or too low.  please keep a record of the readings and bring it to your next appointment here (or you can bring the meter itself).  You can write it on any piece of paper.  please call us sooner if your blood sugar goes below 70, or if you have a lot of readings over 200.   Please come back for a follow-up appointment in 6 weeks.

## 2016-05-19 ENCOUNTER — Other Ambulatory Visit: Payer: Self-pay | Admitting: Endocrinology

## 2016-05-19 MED ORDER — LEVOTHYROXINE SODIUM 137 MCG PO TABS: 137.0000 ug | ORAL_TABLET | Freq: Every day | ORAL | 3 refills | Status: DC

## 2016-05-19 NOTE — Telephone Encounter (Signed)
Pt is asking for a 90 day supply for the levothyroxine 3 refills please

## 2016-05-20 ENCOUNTER — Other Ambulatory Visit: Payer: Self-pay | Admitting: Endocrinology

## 2016-05-25 ENCOUNTER — Telehealth: Payer: Self-pay | Admitting: Endocrinology

## 2016-05-25 MED ORDER — "PARADIGM QUICK-SET 43"" 9MM MISC": 3 refills | Status: DC

## 2016-05-25 MED ORDER — PARADIGM PUMP RESERVOIR 3ML MISC: 3 refills | Status: DC

## 2016-05-25 MED ORDER — INSULIN LISPRO 100 UNIT/ML ~~LOC~~ SOLN: SUBCUTANEOUS | 2 refills | Status: DC

## 2016-05-25 MED ORDER — GLUCOSE BLOOD VI STRP: ORAL_STRIP | 3 refills | Status: DC

## 2016-05-25 NOTE — Telephone Encounter (Signed)
Patient need a refill of medications  insulin lispro (HUMALOG) 100 UNIT/ML injection ONE TOUCH ULTRA TEST test strip Insulin Infusion Pump Supplies (PARADIGM RESERVOIR 3ML) ref # [id number]  And the Quick set  Ref [id number]   3 refills and 90 day supply of all meds

## 2016-05-25 NOTE — Telephone Encounter (Signed)
Insulin Infusion Pump Supplies (PARADIGM RESERVOIR 3ML)  PARADIGM RESERVOIR  quick set

## 2016-05-25 NOTE — Telephone Encounter (Signed)
Refills submitted per patient's request.  

## 2016-05-31 NOTE — Telephone Encounter (Signed)
Patient ask you to give her a call concerning her diabetic supply's.

## 2016-06-01 MED ORDER — "PARADIGM QUICK-SET 43"" 9MM MISC": 3 refills | Status: DC

## 2016-06-01 NOTE — Telephone Encounter (Signed)
Patient need refill for [facility] quick set  3 refills 30 day supply, ref# [id number]. She stated it was not sent in.

## 2016-06-01 NOTE — Telephone Encounter (Signed)
Refill submitted originally on 05/25/2016. Refill resubmitted on 12.12.2017.

## 2016-06-10 ENCOUNTER — Other Ambulatory Visit: Payer: Self-pay | Admitting: Endocrinology

## 2016-07-07 ENCOUNTER — Ambulatory Visit: Payer: 59 | Admitting: Endocrinology

## 2016-07-09 ENCOUNTER — Telehealth: Payer: Self-pay | Admitting: Internal Medicine

## 2016-07-09 NOTE — Telephone Encounter (Signed)
Patient need you to call her about her appointment

## 2016-07-09 NOTE — Telephone Encounter (Signed)
Patient called and stated that she had her last physical with you on 07/15/2015, patient had an appointment with you already this past Wednesday when we were closed, and patient would like to know if she can just reschedule the appointment with you when she can do the physical portion of it as well. Please advise, and I will let the front desk know if this is okay. Thank you!

## 2016-07-09 NOTE — Telephone Encounter (Signed)
All you have to do is to schedule cpx at next avail 2 slots.  I'll see you then.  I hope you feel well.

## 2016-07-10 ENCOUNTER — Other Ambulatory Visit: Payer: Self-pay | Admitting: Endocrinology

## 2016-07-26 ENCOUNTER — Encounter: Payer: 59 | Admitting: Endocrinology

## 2016-08-03 ENCOUNTER — Encounter: Payer: 59 | Admitting: Endocrinology

## 2016-09-01 ENCOUNTER — Encounter: Payer: Self-pay | Admitting: Endocrinology

## 2016-09-01 ENCOUNTER — Ambulatory Visit (INDEPENDENT_AMBULATORY_CARE_PROVIDER_SITE_OTHER): Payer: BLUE CROSS/BLUE SHIELD | Admitting: Endocrinology

## 2016-09-01 VITALS — BP 122/80 | HR 80 | Ht 64.0 in | Wt 154.0 lb

## 2016-09-01 DIAGNOSIS — Z23 Encounter for immunization: Secondary | ICD-10-CM | POA: Diagnosis not present

## 2016-09-01 DIAGNOSIS — E1021 Type 1 diabetes mellitus with diabetic nephropathy: Secondary | ICD-10-CM

## 2016-09-01 DIAGNOSIS — Z0000 Encounter for general adult medical examination without abnormal findings: Secondary | ICD-10-CM

## 2016-09-01 LAB — CBC WITH DIFFERENTIAL/PLATELET
BASOS ABS: 0 10*3/uL (ref 0.0–0.1)
Basophils Relative: 0.7 % (ref 0.0–3.0)
EOS ABS: 0.1 10*3/uL (ref 0.0–0.7)
Eosinophils Relative: 1.6 % (ref 0.0–5.0)
HEMATOCRIT: 41.6 % (ref 36.0–46.0)
HEMOGLOBIN: 13.6 g/dL (ref 12.0–15.0)
LYMPHS PCT: 23.4 % (ref 12.0–46.0)
Lymphs Abs: 1.4 10*3/uL (ref 0.7–4.0)
MCHC: 32.8 g/dL (ref 30.0–36.0)
MCV: 84.4 fl (ref 78.0–100.0)
Monocytes Absolute: 0.5 10*3/uL (ref 0.1–1.0)
Monocytes Relative: 7.7 % (ref 3.0–12.0)
Neutro Abs: 4 10*3/uL (ref 1.4–7.7)
Neutrophils Relative %: 66.6 % (ref 43.0–77.0)
Platelets: 348 10*3/uL (ref 150.0–400.0)
RBC: 4.93 Mil/uL (ref 3.87–5.11)
RDW: 14 % (ref 11.5–15.5)
WBC: 6.1 10*3/uL (ref 4.0–10.5)

## 2016-09-01 LAB — HEPATIC FUNCTION PANEL
ALBUMIN: 3.9 g/dL (ref 3.5–5.2)
ALT: 15 U/L (ref 0–35)
AST: 20 U/L (ref 0–37)
Alkaline Phosphatase: 80 U/L (ref 39–117)
BILIRUBIN TOTAL: 0.4 mg/dL (ref 0.2–1.2)
Bilirubin, Direct: 0.1 mg/dL (ref 0.0–0.3)
TOTAL PROTEIN: 7.4 g/dL (ref 6.0–8.3)

## 2016-09-01 LAB — LIPID PANEL
CHOL/HDL RATIO: 4
Cholesterol: 247 mg/dL — ABNORMAL HIGH (ref 0–200)
HDL: 58.2 mg/dL (ref >39.00)
LDL CALC: 166 mg/dL — AB (ref 0–99)
NonHDL: 188.52
TRIGLYCERIDES: 115 mg/dL (ref 0.0–149.0)
VLDL: 23 mg/dL (ref 0.0–40.0)

## 2016-09-01 LAB — BASIC METABOLIC PANEL
BUN: 29 mg/dL — AB (ref 6–23)
CHLORIDE: 102 meq/L (ref 96–112)
CO2: 30 mEq/L (ref 19–32)
CREATININE: 1.09 mg/dL (ref 0.40–1.20)
Calcium: 10.3 mg/dL (ref 8.4–10.5)
GFR: 66.06 mL/min (ref >60.00)
Glucose, Bld: 136 mg/dL — ABNORMAL HIGH (ref 70–99)
POTASSIUM: 4.7 meq/L (ref 3.5–5.1)
Sodium: 138 mEq/L (ref 135–145)

## 2016-09-01 LAB — POCT GLYCOSYLATED HEMOGLOBIN (HGB A1C): Hemoglobin A1C: 11

## 2016-09-01 LAB — TSH: TSH: 0.17 u[IU]/mL — AB (ref 0.35–4.50)

## 2016-09-01 MED ORDER — AMILORIDE-HYDROCHLOROTHIAZIDE 5-50 MG PO TABS: 0.5000 | ORAL_TABLET | Freq: Every day | ORAL | 3 refills | Status: DC

## 2016-09-01 NOTE — Patient Instructions (Addendum)
Please take these settings: basal rate of 0.5 units/hr from 2 AM-3 PM, and 1.5 units/hr 3 PM-2 AM.   No mealtime bolus.   correction bolus (which some people call "sensitivity," or "insulin sensitivity ratio," or just "isr") of 1 unit whenever your blood sugar is over 300.   If you are going to be active, suspend the pump for 1-2 hrs.    check your blood sugar 4 times a day: the 3 meals, and at bedtime.  also check if you have symptoms of your blood sugar being too high or too low.  please keep a record of the readings and bring it to your next appointment here (or you can bring the meter itself).  You can write it on any piece of paper.  please call us sooner if your blood sugar goes below 70, or if you have a lot of readings over 200.   Please consider these measures for your health:  minimize alcohol.  Do not use tobacco products.  Have a colonoscopy at least every 10 years from age 35.  Women should have an annual mammogram from age 58.  Keep firearms safely stored.  Always use seat belts.  have working smoke alarms in your home.  See an eye doctor and dentist regularly.  Never drive under the influence of alcohol or drugs (including prescription drugs).   Please come back for a follow-up appointment in 6 weeks.

## 2016-09-01 NOTE — Progress Notes (Signed)
Subjective:    Patient ID: Katherine Navarro, female    DOB: 05/09/1956, 61 y.o.   MRN: [id number]  HPI Pt is here for regular wellness examination, and is feeling pretty well in general, and says chronic med probs are stable, except as noted below Past Medical History:  Diagnosis Date  . CONSTIPATION, CHRONIC 12/13/2007  . DIABETES MELLITUS, TYPE I 01/07/2007  . DM nephropathy/sclerosis   . GLAUCOMA 12/13/2007  . HYPERCHOLESTEROLEMIA 12/13/2007  . HYPERTENSION 01/07/2007  . Hyperthyroidism   . INTERNAL HEMORRHOIDS 07/23/2008  . LEUKOPENIA, CHRONIC 12/13/2007  . Nonproliferative diabetic retinopathy OAC(166.06) 12/13/2007  . Unspecified hypothyroidism 12/13/2007  . VARICOSE VEINS, LOWER EXTREMITIES 12/13/2007    Past Surgical History:  Procedure Laterality Date  . ELECTROCARDIOGRAM  08/24/2006  . EYE SURGERY     bilateral  . LEEP N/A 12/01/2012   Procedure: LOOP ELECTROSURGICAL EXCISION PROCEDURE (LEEP);  Surgeon: Delice Lesch, MD;  Location: [facility];  Service: Gynecology;  Laterality: N/A;  . Stress Myoview   05/18/2004  . TUBAL LIGATION      Social History   Social History  . Marital status: Married    Spouse name: N/A  . Number of children: N/A  . Years of education: N/A   Occupational History  .      Doesn't work outside the home   Social History Main Topics  . Smoking status: Never Smoker  . Smokeless tobacco: Never Used  . Alcohol use No  . Drug use: No  . Sexual activity: Not on file   Other Topics Concern  . Not on file   Social History Narrative   Pt gets regular exercise.          Current Outpatient Prescriptions on File Prior to Visit  Medication Sig Dispense Refill  . alum & mag hydroxide-simeth (MYLANTA) 200-200-20 MG/5ML suspension as needed.      Marland Kitchen Cod Liver Oil CAPS Take 1 capsule by mouth daily.      . colestipol (COLESTID) 5 G granules Take 5 g by mouth daily. 500 g 1  . dorzolamide-timolol (COSOPT) 22.3-6.8 MG/ML ophthalmic solution 1 drop 2  (two) times daily.    . fluticasone (FLONASE) 50 MCG/ACT nasal spray Place 2 sprays into both nostrils daily. 16 g 6  . Glucosamine 500 MG TABS Take 1 tablet by mouth daily.      Marland Kitchen glucose blood (ONE TOUCH ULTRA TEST) test strip USE AS INSTRUCTED 12 TIMES  A DAY 600 each 3  . HUMALOG 100 UNIT/ML injection INJECT SUBCUTANEOUSLY 50  UNITS EVERY DAY VIA PUMP. 50 mL 11  . hydrocortisone-pramoxine (ANALPRAM-HC) 2.5-1 % rectal cream Place rectally as needed for hemorrhoids. Apply to rectum as needed 30 g 1  . ibuprofen (ADVIL,MOTRIN) 600 MG tablet Take 1 tablet (600 mg total) by mouth every 6 (six) hours as needed for pain. 30 tablet 0  . Insulin Infusion Pump Supplies (MINIMED INFUSION SET-MMT 396) MISC CHANGE EVERY 2 DAYS 50 each 3  . Insulin Infusion Pump Supplies (PARADIGM RESERVOIR 3ML) MISC CHANGE EVERY 2 DAYS 50 each 3  . insulin lispro (HUMALOG) 100 UNIT/ML injection Inject subcutaneously 50  units every day via pump 50 mL 2  . levothyroxine (SYNTHROID, LEVOTHROID) 137 MCG tablet Take 1 tablet (137 mcg total) by mouth daily before breakfast. 90 tablet 3  . levothyroxine (SYNTHROID, LEVOTHROID) 137 MCG tablet TAKE ONE TABLET BY MOUTH ONCE DAILY BEFORE BREAKFAST **PATIENT  NEEDS  APPOINTMENT  FOR  FURTHER  REFILLS**  30 tablet 0  . magnesium oxide (MAG-OX) 400 MG tablet Take 2 tablets once daily     . nitrofurantoin (MACRODANTIN) 50 MG capsule Take 1 capsule (50 mg total) by mouth 3 (three) times daily. 21 capsule 0  . norethindrone (MICRONOR,CAMILA,ERRIN) 0.35 MG tablet Take 1 tablet by mouth daily.    . vitamin E 400 UNIT capsule Take 2 capsules by mouth twice a day as needed      No current facility-administered medications on file prior to visit.     Allergies  Allergen Reactions  . Atorvastatin     REACTION: perceived myalgias  . Ciprofloxacin     REACTION: pt states med  make her sick  . Epinephrine     REACTION: thyroid problems  . Erythromycin   . Morphine   . Penicillins      REACTION: hives    Family History  Problem Relation Age of Onset  . Cancer Mother     Breast Cancer  . Diabetes Mother   . Diabetes Father     BP 122/80   Pulse 80   Ht 5\' 4"  (1.626 m)   Wt 154 lb (69.9 kg)   SpO2 98%   BMI 26.43 kg/m     Review of Systems Constitutional: Negative for fever.  HENT: Negative for hearing loss.   Eyes: Negative for visual disturbance.  Respiratory: Negative for shortness of breath.   Cardiovascular: Negative for chest pain.  Gastrointestinal: Negative for anal bleeding.  Endocrine: Negative for cold intolerance.  Genitourinary: Negative for hematuria.  Musculoskeletal: Negative for back pain.  Skin: Negative for rash.  Allergic/Immunologic: Positive for environmental allergies.  Neurological: Negative for numbness.  Hematological: Does not bruise/bleed easily.   Psychiatric/Behavioral: Negative for dysphoric mood.     Objective:   Physical Exam VS: see vs page GEN: no distress HEAD: head: no deformity eyes: no periorbital swelling. bilat proptosis is again noted external nose and ears are normal mouth: no lesion seen NECK: supple, thyroid is not enlarged CHEST WALL: no deformity LUNGS:  Clear to auscultation BREASTS: sees gyn CV: reg rate and rhythm, no murmur ABD: abdomen is soft, nontender.  no hepatosplenomegaly.  not distended.  no hernia GENITALIA/RECTAL: sees gyn MUSCULOSKELETAL: muscle bulk and strength are grossly normal.  no obvious joint swelling.  gait is normal and steady PULSES: no carotid bruit NEURO:  cn 2-12 grossly intact.   readily moves all 4's. SKIN:  Normal texture and temperature.  No rash or suspicious lesion is visible.   NODES:  None palpable at the neck PSYCH: alert, well-oriented.  Does not appear anxious nor depressed.     ecg machine does not work.      Assessment & Plan:  Wellness visit today, with problems stable, except as noted.    SEPARATE EVALUATION FOLLOWS--EACH PROBLEM HERE IS NEW,  NOT RESPONDING TO TREATMENT, OR POSES SIGNIFICANT RISK TO THE PATIENT'S HEALTH: HISTORY OF THE PRESENT ILLNESS: Pt returns for f/u of diabetes mellitus:   DM type: 1  Dx'ed: 1978.   Complications: polyneuropathy, retinopathy and nephropathy.  Therapy: insulin since dx GDM: never DKA: never Severe hypoglycemia: once (1988) Pancreatitis: never.  Other: on pump rx since approx 2005; therapy has been limited by noncompliance with cbg recording, and dosing via her pump; pump settings have been adjusted to emphasize basal rate; she sleeps from 4 AM-11 AM, and takes frequent naps during the day. Interval history: Pt is taking these pump settings, rather than what was prescribed: basal  rate of 0.8 units/hr from 4PM-8PM, and 0.7 units/HR, 8PM-4AM. mealtime bolus of 4-5 units per meal correction bolus (which some people call "sensitivity," or "insulin sensitivity ratio," or just "isr") of 1 unit whenever your blood sugar is over 300.   If activity, suspend the pump for 1-2 hrs.    no cbg record, but states cbg's vary from 90-400.  She says she sometimes does not eat breakfast until 4 PM.  As husband works 2nd shift,  PAST MEDICAL HISTORY:  Past Medical History:  Diagnosis Date  . CONSTIPATION, CHRONIC 12/13/2007  . DIABETES MELLITUS, TYPE I 01/07/2007  . DM nephropathy/sclerosis   . GLAUCOMA 12/13/2007  . HYPERCHOLESTEROLEMIA 12/13/2007  . HYPERTENSION 01/07/2007  . Hyperthyroidism   . INTERNAL HEMORRHOIDS 07/23/2008  . LEUKOPENIA, CHRONIC 12/13/2007  . Nonproliferative diabetic retinopathy HEN(277.82) 12/13/2007  . Unspecified hypothyroidism 12/13/2007  . VARICOSE VEINS, LOWER EXTREMITIES 12/13/2007    Past Surgical History:  Procedure Laterality Date  . ELECTROCARDIOGRAM  08/24/2006  . EYE SURGERY     bilateral  . LEEP N/A 12/01/2012   Procedure: LOOP ELECTROSURGICAL EXCISION PROCEDURE (LEEP);  Surgeon: Delice Lesch, MD;  Location: [facility];  Service: Gynecology;  Laterality: N/A;  . Stress  Myoview   05/18/2004  . TUBAL LIGATION      Social History   Social History  . Marital status: Married    Spouse name: N/A  . Number of children: N/A  . Years of education: N/A   Occupational History  .      Doesn't work outside the home   Social History Main Topics  . Smoking status: Never Smoker  . Smokeless tobacco: Never Used  . Alcohol use No  . Drug use: No  . Sexual activity: Not on file   Other Topics Concern  . Not on file   Social History Narrative   Pt gets regular exercise.          Current Outpatient Prescriptions on File Prior to Visit  Medication Sig Dispense Refill  . alum & mag hydroxide-simeth (MYLANTA) 200-200-20 MG/5ML suspension as needed.      Marland Kitchen Cod Liver Oil CAPS Take 1 capsule by mouth daily.      . colestipol (COLESTID) 5 G granules Take 5 g by mouth daily. 500 g 1  . dorzolamide-timolol (COSOPT) 22.3-6.8 MG/ML ophthalmic solution 1 drop 2 (two) times daily.    . fluticasone (FLONASE) 50 MCG/ACT nasal spray Place 2 sprays into both nostrils daily. 16 g 6  . Glucosamine 500 MG TABS Take 1 tablet by mouth daily.      Marland Kitchen glucose blood (ONE TOUCH ULTRA TEST) test strip USE AS INSTRUCTED 12 TIMES  A DAY 600 each 3  . HUMALOG 100 UNIT/ML injection INJECT SUBCUTANEOUSLY 50  UNITS EVERY DAY VIA PUMP. 50 mL 11  . hydrocortisone-pramoxine (ANALPRAM-HC) 2.5-1 % rectal cream Place rectally as needed for hemorrhoids. Apply to rectum as needed 30 g 1  . ibuprofen (ADVIL,MOTRIN) 600 MG tablet Take 1 tablet (600 mg total) by mouth every 6 (six) hours as needed for pain. 30 tablet 0  . Insulin Infusion Pump Supplies (MINIMED INFUSION SET-MMT 396) MISC CHANGE EVERY 2 DAYS 50 each 3  . Insulin Infusion Pump Supplies (PARADIGM RESERVOIR 3ML) MISC CHANGE EVERY 2 DAYS 50 each 3  . insulin lispro (HUMALOG) 100 UNIT/ML injection Inject subcutaneously 50  units every day via pump 50 mL 2  . levothyroxine (SYNTHROID, LEVOTHROID) 137 MCG tablet Take 1 tablet (137 mcg  total) by mouth daily before breakfast. 90 tablet 3  . levothyroxine (SYNTHROID, LEVOTHROID) 137 MCG tablet TAKE ONE TABLET BY MOUTH ONCE DAILY BEFORE BREAKFAST **PATIENT  NEEDS  APPOINTMENT  FOR  FURTHER  REFILLS** 30 tablet 0  . magnesium oxide (MAG-OX) 400 MG tablet Take 2 tablets once daily     . nitrofurantoin (MACRODANTIN) 50 MG capsule Take 1 capsule (50 mg total) by mouth 3 (three) times daily. 21 capsule 0  . norethindrone (MICRONOR,CAMILA,ERRIN) 0.35 MG tablet Take 1 tablet by mouth daily.    . vitamin E 400 UNIT capsule Take 2 capsules by mouth twice a day as needed      No current facility-administered medications on file prior to visit.     Allergies  Allergen Reactions  . Atorvastatin     REACTION: perceived myalgias  . Ciprofloxacin     REACTION: pt states med  make her sick  . Epinephrine     REACTION: thyroid problems  . Erythromycin   . Morphine   . Penicillins     REACTION: hives    Family History  Problem Relation Age of Onset  . Cancer Mother     Breast Cancer  . Diabetes Mother   . Diabetes Father     BP 122/80   Pulse 80   Ht 5\' 4"  (1.626 m)   Wt 154 lb (69.9 kg)   SpO2 98%   BMI 26.43 kg/m   REVIEW OF SYSTEMS: She denies hypoglycemia PHYSICAL EXAMINATION: VITAL SIGNS:  See vs page GENERAL: no distress Pulses: dorsalis pedis intact bilat.   MSK: no deformity of the feet.   CV: trace bilat leg edema.   Skin: no ulcer on the feet. normal temp on the feet, but there is patchy hyperpigmentation.  Neuro: sensation is intact to touch on the feet, but decreased from normal.  LAB/XRAY RESULTS:  A1c=11.0% Lab Results  Component Value Date   CHOL 247 (H) 09/01/2016   HDL 58.20 09/01/2016   LDLCALC 166 (H) 09/01/2016   LDLDIRECT 210.4 05/14/2013   TRIG 115.0 09/01/2016   CHOLHDL 4 09/01/2016  IMPRESSION: Dyslipidemia: persistent Type 1 DM: ongoing poor control Chronic noncompliance with cbg recording and pump settings: we discussed  risks.  PLAN:  She declines to increase daytime basal to 2 units/HR.   Please take these settings: basal rate of 0.5 units/hr from 2 AM-3 PM, and 1.5 units/hr 3 PM-2 AM.   No mealtime bolus.   correction bolus (which some people call "sensitivity," or "insulin sensitivity ratio," or just "isr") of 1 unit whenever your blood sugar is over 300.   If you are going to be active, suspend the pump for 1-2 hrs.   check your blood sugar 4 times a day: the 3 meals, and at bedtime.  also check if you have symptoms of your blood sugar being too high or too low.  please keep a record of the readings and bring it to your next appointment here (or you can bring the meter itself).  You can write it on any piece of paper.  please call us sooner if your blood sugar goes below 70, or if you have a lot of readings over 200.

## 2016-09-02 ENCOUNTER — Telehealth: Payer: Self-pay

## 2016-09-02 NOTE — Telephone Encounter (Signed)
Left a voicemail advising of message. Requested a call back if the patient is ok with starting cholesterol medication.

## 2016-09-02 NOTE — Telephone Encounter (Signed)
-----   Message from Renato Shin, MD sent at 09/01/2016  6:36 PM EDT ----- please call patient: Good results, except for cholesterol. Do you want to take a medication (different from what you took in the past)?

## 2016-09-06 ENCOUNTER — Telehealth: Payer: Self-pay | Admitting: Endocrinology

## 2016-09-06 ENCOUNTER — Other Ambulatory Visit: Payer: Self-pay

## 2016-09-06 MED ORDER — GLUCOSE BLOOD VI STRP: ORAL_STRIP | 3 refills | Status: DC

## 2016-09-06 MED ORDER — INSULIN LISPRO 100 UNIT/ML ~~LOC~~ SOLN: SUBCUTANEOUS | 11 refills | Status: DC

## 2016-09-06 MED ORDER — PARADIGM PUMP RESERVOIR 3ML MISC: 3 refills | Status: DC

## 2016-09-06 MED ORDER — "PARADIGM QUICK-SET 43"" 9MM MISC": 3 refills | Status: DC

## 2016-09-06 MED ORDER — COLESTIPOL HCL 5 G PO GRAN: 5.0000 g | GRANULES | Freq: Every day | ORAL | 3 refills | Status: DC

## 2016-09-06 NOTE — Telephone Encounter (Signed)
Patient ask you to give her a call °

## 2016-09-06 NOTE — Telephone Encounter (Signed)
Ok, I have sent a prescription to your pharmacy 

## 2016-09-06 NOTE — Telephone Encounter (Signed)
Patient called back and stated she would be ok with going on cholesterol medication if she could go back on the powder cholesterol medication she was taking previously. Please advise. Thanks!

## 2016-09-07 MED ORDER — COLESTIPOL HCL 5 G PO GRAN: 5.0000 g | GRANULES | Freq: Every day | ORAL | 3 refills | Status: DC

## 2016-09-07 NOTE — Telephone Encounter (Signed)
Patient notified

## 2016-09-08 MED ORDER — COLESTIPOL HCL 5 G PO GRAN: 5.0000 g | GRANULES | Freq: Every day | ORAL | 3 refills | Status: DC

## 2016-09-08 MED ORDER — "PARADIGM QUICK-SET 43"" 9MM MISC": 3 refills | Status: DC

## 2016-09-08 MED ORDER — INSULIN LISPRO 100 UNIT/ML ~~LOC~~ SOLN: SUBCUTANEOUS | 11 refills | Status: DC

## 2016-09-08 MED ORDER — GLUCOSE BLOOD VI STRP: ORAL_STRIP | 3 refills | Status: DC

## 2016-09-08 MED ORDER — PARADIGM PUMP RESERVOIR 3ML MISC: 3 refills | Status: DC

## 2016-09-08 NOTE — Telephone Encounter (Signed)
Pharmacy removed

## 2016-09-08 NOTE — Telephone Encounter (Signed)
Pt needs her [facility] pharmacy removed from her chart.

## 2016-09-08 NOTE — Telephone Encounter (Signed)
Patient ask you to give her a call, and she also need a copy of her blood for last year and this year.

## 2016-10-13 ENCOUNTER — Ambulatory Visit: Payer: BLUE CROSS/BLUE SHIELD | Admitting: Endocrinology

## 2016-11-19 ENCOUNTER — Other Ambulatory Visit: Payer: Self-pay | Admitting: Endocrinology

## 2016-11-25 ENCOUNTER — Other Ambulatory Visit: Payer: Self-pay

## 2016-11-25 MED ORDER — LEVOTHYROXINE SODIUM 137 MCG PO TABS: ORAL_TABLET | ORAL | 1 refills | Status: DC

## 2017-01-12 ENCOUNTER — Ambulatory Visit (INDEPENDENT_AMBULATORY_CARE_PROVIDER_SITE_OTHER): Payer: BLUE CROSS/BLUE SHIELD | Admitting: Endocrinology

## 2017-01-12 ENCOUNTER — Encounter: Payer: Self-pay | Admitting: Endocrinology

## 2017-01-12 VITALS — BP 122/70 | HR 84 | Wt 152.4 lb

## 2017-01-12 DIAGNOSIS — E1021 Type 1 diabetes mellitus with diabetic nephropathy: Secondary | ICD-10-CM

## 2017-01-12 LAB — POCT GLYCOSYLATED HEMOGLOBIN (HGB A1C): Hemoglobin A1C: 11.9

## 2017-01-12 NOTE — Progress Notes (Signed)
Subjective:    Patient ID: Katherine Navarro, female    DOB: 07-23-1955, 60 y.o.   MRN: [id number]  HPI Pt returns for f/u of diabetes mellitus:   DM type: 1  Dx'ed: 1978.   Complications: polyneuropathy, retinopathy and nephropathy.  Therapy: insulin since dx GDM: never DKA: never Severe hypoglycemia: once (1988) Pancreatitis: never.  Other: on pump rx since approx 2005; therapy has been limited by noncompliance with cbg recording, and dosing via her pump; pump settings have been adjusted to emphasize basal rate; she sleeps from 4 AM-11 AM, and takes frequent naps during the day. Interval history: Pt is taking these pump settings: basal rate of 0.5 units/hr from 2 AM-3 PM, and 1.5 units/hr 3 PM-2 AM.   No mealtime bolus.   correction bolus (which some people call "sensitivity," or "insulin sensitivity ratio," or just "isr") of 1 unit whenever your blood sugar is over 300.   If she are going to be active, she suspends the pump for 1-2 hrs.    no cbg record, but states cbg's vary from 60-370.  It is in general higher as the day goes on.   Past Medical History:  Diagnosis Date  . CONSTIPATION, CHRONIC 12/13/2007  . DIABETES MELLITUS, TYPE I 01/07/2007  . DM nephropathy/sclerosis   . GLAUCOMA 12/13/2007  . HYPERCHOLESTEROLEMIA 12/13/2007  . HYPERTENSION 01/07/2007  . Hyperthyroidism   . INTERNAL HEMORRHOIDS 07/23/2008  . LEUKOPENIA, CHRONIC 12/13/2007  . Nonproliferative diabetic retinopathy KAJ(681.15) 12/13/2007  . Unspecified hypothyroidism 12/13/2007  . VARICOSE VEINS, LOWER EXTREMITIES 12/13/2007    Past Surgical History:  Procedure Laterality Date  . ELECTROCARDIOGRAM  08/24/2006  . EYE SURGERY     bilateral  . LEEP N/A 12/01/2012   Procedure: LOOP ELECTROSURGICAL EXCISION PROCEDURE (LEEP);  Surgeon: Delice Lesch, MD;  Location: [facility];  Service: Gynecology;  Laterality: N/A;  . Stress Myoview   05/18/2004  . TUBAL LIGATION      Social History   Social History  . Marital  status: Married    Spouse name: N/A  . Number of children: N/A  . Years of education: N/A   Occupational History  .      Doesn't work outside the home   Social History Main Topics  . Smoking status: Never Smoker  . Smokeless tobacco: Never Used  . Alcohol use No  . Drug use: No  . Sexual activity: Not on file   Other Topics Concern  . Not on file   Social History Narrative   Pt gets regular exercise.          Current Outpatient Prescriptions on File Prior to Visit  Medication Sig Dispense Refill  . alum & mag hydroxide-simeth (MYLANTA) 200-200-20 MG/5ML suspension as needed.      Marland Kitchen amiloride-hydrochlorothiazide (MODURETIC) 5-50 MG tablet Take 0.5 tablets by mouth daily. 45 tablet 3  . Cod Liver Oil CAPS Take 1 capsule by mouth daily.      . colestipol (COLESTID) 5 g granules Take 5 g by mouth daily. 500 g 3  . dorzolamide-timolol (COSOPT) 22.3-6.8 MG/ML ophthalmic solution 1 drop 2 (two) times daily.    . fluticasone (FLONASE) 50 MCG/ACT nasal spray Place 2 sprays into both nostrils daily. 16 g 6  . Glucosamine 500 MG TABS Take 1 tablet by mouth daily.      Marland Kitchen glucose blood (ONE TOUCH ULTRA TEST) test strip USE AS INSTRUCTED 12 TIMES  A DAY 600 each 3  . hydrocortisone-pramoxine (ANALPRAM-HC)  2.5-1 % rectal cream Place rectally as needed for hemorrhoids. Apply to rectum as needed 30 g 1  . ibuprofen (ADVIL,MOTRIN) 600 MG tablet Take 1 tablet (600 mg total) by mouth every 6 (six) hours as needed for pain. 30 tablet 0  . Insulin Infusion Pump Supplies (MINIMED INFUSION SET-MMT 396) MISC CHANGE EVERY 2 DAYS 50 each 3  . Insulin Infusion Pump Supplies (PARADIGM RESERVOIR 3ML) MISC CHANGE EVERY 2 DAYS 50 each 3  . insulin lispro (HUMALOG) 100 UNIT/ML injection Inject subcutaneously 50  units every day via pump 50 mL 2  . insulin lispro (HUMALOG) 100 UNIT/ML injection INJECT SUBCUTANEOUSLY 50  UNITS EVERY DAY VIA PUMP. 50 mL 11  . levothyroxine (SYNTHROID, LEVOTHROID) 137 MCG tablet  TAKE ONE TABLET BY MOUTH ONCE DAILY BEFORE BREAKFAST **PATIENT  NEEDS  APPOINTMENT  FOR  FURTHER  REFILLS** 30 tablet 0  . levothyroxine (SYNTHROID, LEVOTHROID) 137 MCG tablet TAKE ONE TABLET BY MOUTH ONCE DAILY BEFORE BREAKFAST 90 tablet 1  . magnesium oxide (MAG-OX) 400 MG tablet Take 2 tablets once daily     . nitrofurantoin (MACRODANTIN) 50 MG capsule Take 1 capsule (50 mg total) by mouth 3 (three) times daily. 21 capsule 0  . norethindrone (MICRONOR,CAMILA,ERRIN) 0.35 MG tablet Take 1 tablet by mouth daily.    . vitamin E 400 UNIT capsule Take 2 capsules by mouth twice a day as needed      No current facility-administered medications on file prior to visit.     Allergies  Allergen Reactions  . Atorvastatin     REACTION: perceived myalgias  . Ciprofloxacin     REACTION: pt states med  make her sick  . Epinephrine     REACTION: thyroid problems  . Erythromycin   . Morphine   . Penicillins     REACTION: hives    Family History  Problem Relation Age of Onset  . Cancer Mother        Breast Cancer  . Diabetes Mother   . Diabetes Father     BP 122/70   Pulse 84   Wt 152 lb 6.4 oz (69.1 kg)   SpO2 98%   BMI 26.16 kg/m    Review of Systems Denies LOC.     Objective:   Physical Exam VITAL SIGNS:  See vs page GENERAL: no distress Pulses: foot pulses are intact bilaterally.   MSK: no deformity of the feet or ankles.  CV: trace bilat edema of the legs or ankles.  Skin:  no ulcer on the feet or ankles.  normal color and temp on the feet and ankles Neuro: sensation is intact to touch on the feet and ankles, but decreased from normal.   Lab Results  Component Value Date   HGBA1C 11.9 01/12/2017   I personally reviewed electrocardiogram tracing (today): Indication: DM. Impression: NSR, with short PR.  No MI.  No hypertrophy. Compared to 2017: no significant change.     Assessment & Plan:  Type 1 DM, with DR: she needs increased rx. She declines to make any more  than minimal adjustments in pump settings.   Patient Instructions  Please take these settings: basal rate of 0.5 units/hr from 2 AM-3 PM, and 1.6 units/hr 3 PM-2 AM.   No mealtime bolus.   correction bolus (which some people call "sensitivity," or "insulin sensitivity ratio," or just "isr") of 1 unit whenever your blood sugar is over 300.   If you are going to be active, suspend the pump  for 1-2 hrs.  check your blood sugar 4 times a day: the 3 meals, and at bedtime.  also check if you have symptoms of your blood sugar being too high or too low.  please keep a record of the readings and bring it to your next appointment here (or you can bring the meter itself).  You can write it on any piece of paper.  please call us sooner if your blood sugar goes below 70, or if you have a lot of readings over 200.    Please see Vaughan Basta, to consider a 670 pump.   Please come back for a follow-up appointment in 2 months.

## 2017-01-12 NOTE — Patient Instructions (Addendum)
Please take these settings: basal rate of 0.5 units/hr from 2 AM-3 PM, and 1.6 units/hr 3 PM-2 AM.   No mealtime bolus.   correction bolus (which some people call "sensitivity," or "insulin sensitivity ratio," or just "isr") of 1 unit whenever your blood sugar is over 300.   If you are going to be active, suspend the pump for 1-2 hrs.  check your blood sugar 4 times a day: the 3 meals, and at bedtime.  also check if you have symptoms of your blood sugar being too high or too low.  please keep a record of the readings and bring it to your next appointment here (or you can bring the meter itself).  You can write it on any piece of paper.  please call us sooner if your blood sugar goes below 70, or if you have a lot of readings over 200.    Please see Vaughan Basta, to consider a 670 pump.   Please come back for a follow-up appointment in 2 months.

## 2017-01-24 ENCOUNTER — Other Ambulatory Visit: Payer: Self-pay | Admitting: Endocrinology

## 2017-03-04 ENCOUNTER — Other Ambulatory Visit: Payer: Self-pay | Admitting: Obstetrics and Gynecology

## 2017-03-04 DIAGNOSIS — Z1231 Encounter for screening mammogram for malignant neoplasm of breast: Secondary | ICD-10-CM

## 2017-03-23 ENCOUNTER — Telehealth: Payer: Self-pay | Admitting: *Deleted

## 2017-03-23 NOTE — Telephone Encounter (Signed)
Patient called and states that she wants to speak to the nurse. Patient didn't want to discuss what she wanted to talk about. Her contact number is [phone]. Please advise. Thank you .

## 2017-03-24 NOTE — Telephone Encounter (Signed)
Called patient & left VM to call back to speak to nurse.

## 2017-04-05 ENCOUNTER — Telehealth: Payer: Self-pay | Admitting: Endocrinology

## 2017-04-05 NOTE — Telephone Encounter (Signed)
Pt called requesting refills on her insulin lispro (HUMALOG) 100 UNIT/ML injection, glucose blood (ONE TOUCH ULTRA TEST) test strip, Insulin Infusion Pump Supplies (PARADIGM RESERVOIR 3ML) MISC, and Insulin Infusion Pump Supplies (minimed infusion set-MMT 396 and 332A). Pt requested a call once completed. Please advise, thank you!  Pharmacy - [facility], [place]  Call pt @ [phone]

## 2017-04-06 ENCOUNTER — Other Ambulatory Visit: Payer: Self-pay

## 2017-04-06 MED ORDER — INSULIN LISPRO 100 UNIT/ML ~~LOC~~ SOLN: SUBCUTANEOUS | 2 refills | Status: DC

## 2017-04-06 MED ORDER — "PARADIGM QUICK-SET 43"" 9MM MISC": 3 refills | Status: DC

## 2017-04-06 MED ORDER — PARADIGM PUMP RESERVOIR 3ML MISC: 3 refills | Status: DC

## 2017-04-06 MED ORDER — GLUCOSE BLOOD VI STRP: ORAL_STRIP | 3 refills | Status: DC

## 2017-04-06 NOTE — Telephone Encounter (Signed)
Called patient on both phone numbers available for her but couldn't leave VM on cell. I left a message on answering machine stating that supplies & prescriptions had [facility].

## 2017-04-20 ENCOUNTER — Other Ambulatory Visit: Payer: Self-pay

## 2017-04-20 ENCOUNTER — Ambulatory Visit
Admission: RE | Admit: 2017-04-20 | Discharge: 2017-04-20 | Disposition: A | Discharge status: Home / Self Care | Payer: BLUE CROSS/BLUE SHIELD | Source: Ambulatory Visit | Attending: Obstetrics and Gynecology | Admitting: Obstetrics and Gynecology

## 2017-04-20 ENCOUNTER — Encounter: Payer: Self-pay | Admitting: Endocrinology

## 2017-04-20 ENCOUNTER — Ambulatory Visit (INDEPENDENT_AMBULATORY_CARE_PROVIDER_SITE_OTHER): Payer: BLUE CROSS/BLUE SHIELD | Admitting: Endocrinology

## 2017-04-20 VITALS — BP 142/74 | HR 79 | Wt 155.4 lb

## 2017-04-20 DIAGNOSIS — Z1231 Encounter for screening mammogram for malignant neoplasm of breast: Secondary | ICD-10-CM

## 2017-04-20 DIAGNOSIS — E1021 Type 1 diabetes mellitus with diabetic nephropathy: Secondary | ICD-10-CM

## 2017-04-20 LAB — POCT GLYCOSYLATED HEMOGLOBIN (HGB A1C): HEMOGLOBIN A1C: 11.6

## 2017-04-20 IMAGING — MG DIGITAL SCREENING BILATERAL MAMMOGRAM WITH CAD
4 series · 4 of 4 positions shown · non-contrast
Comparison: None.

CLINICAL DATA: Screening.

EXAM:
DIGITAL SCREENING BILATERAL MAMMOGRAM WITH CAD

[L MLO]
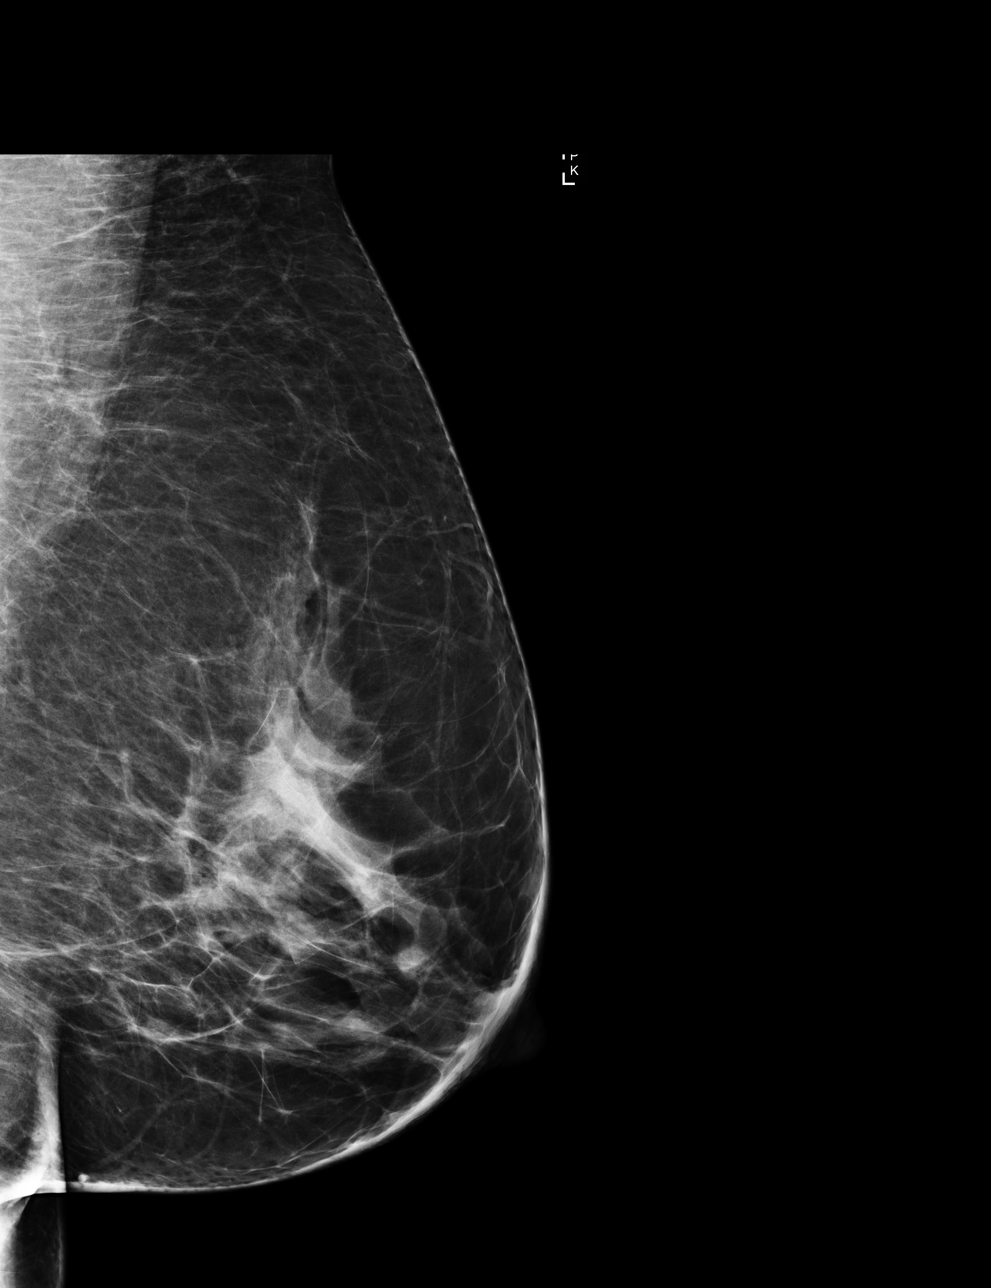

[R CC]
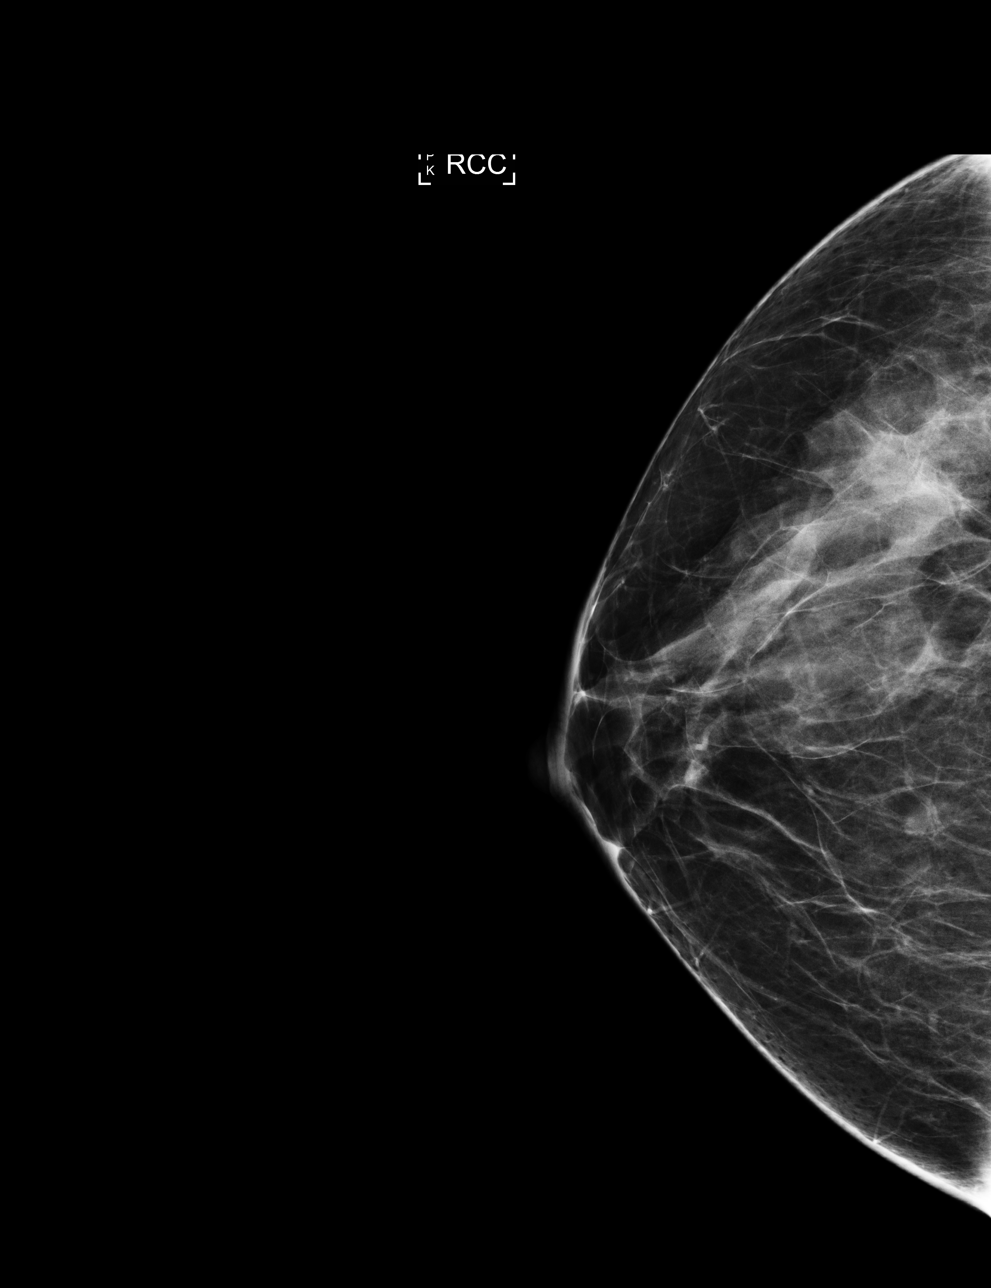

[R MLO]
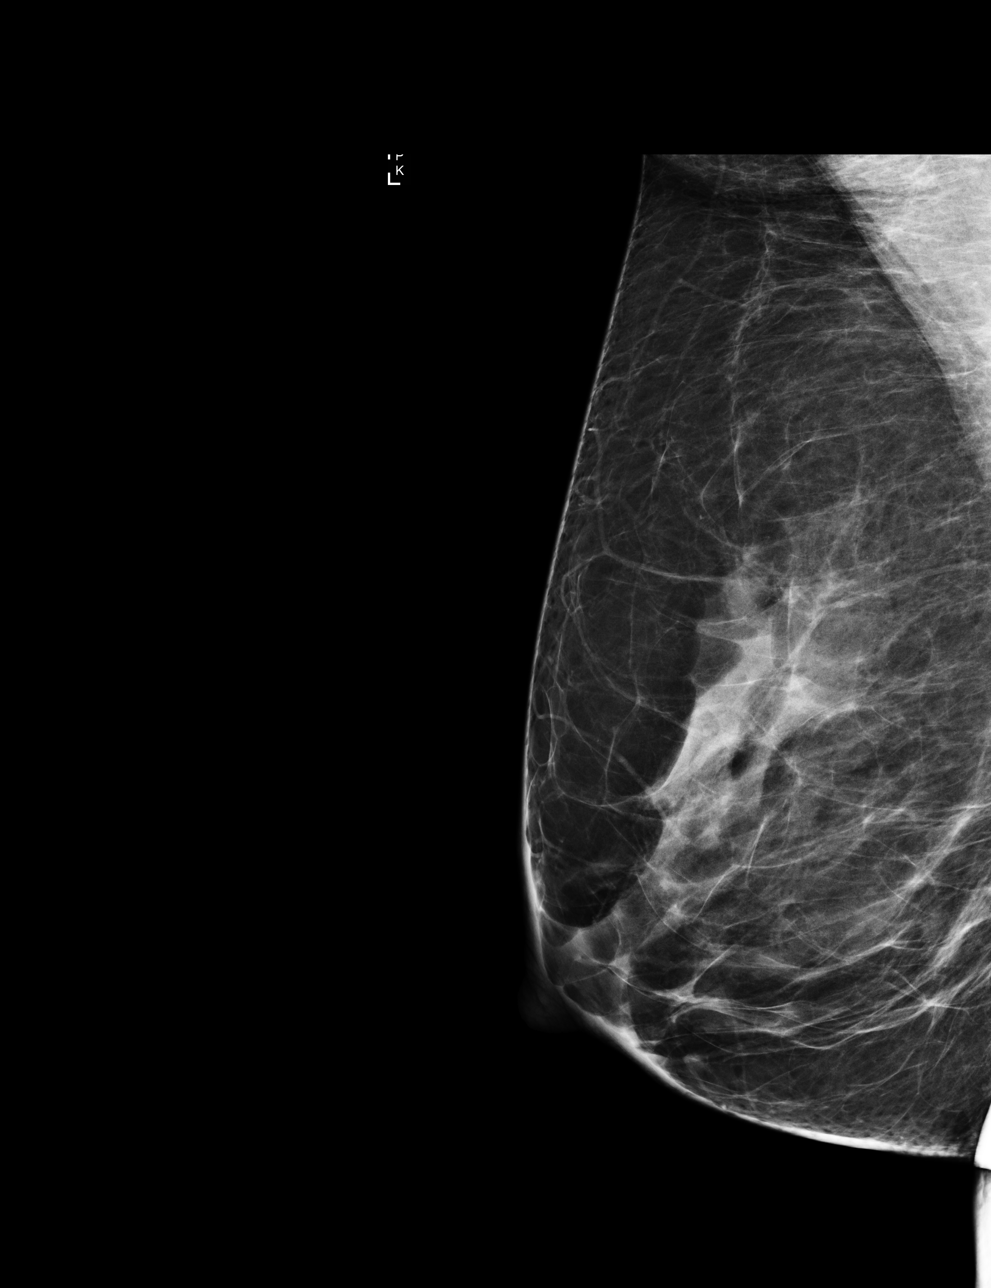

[L CC]
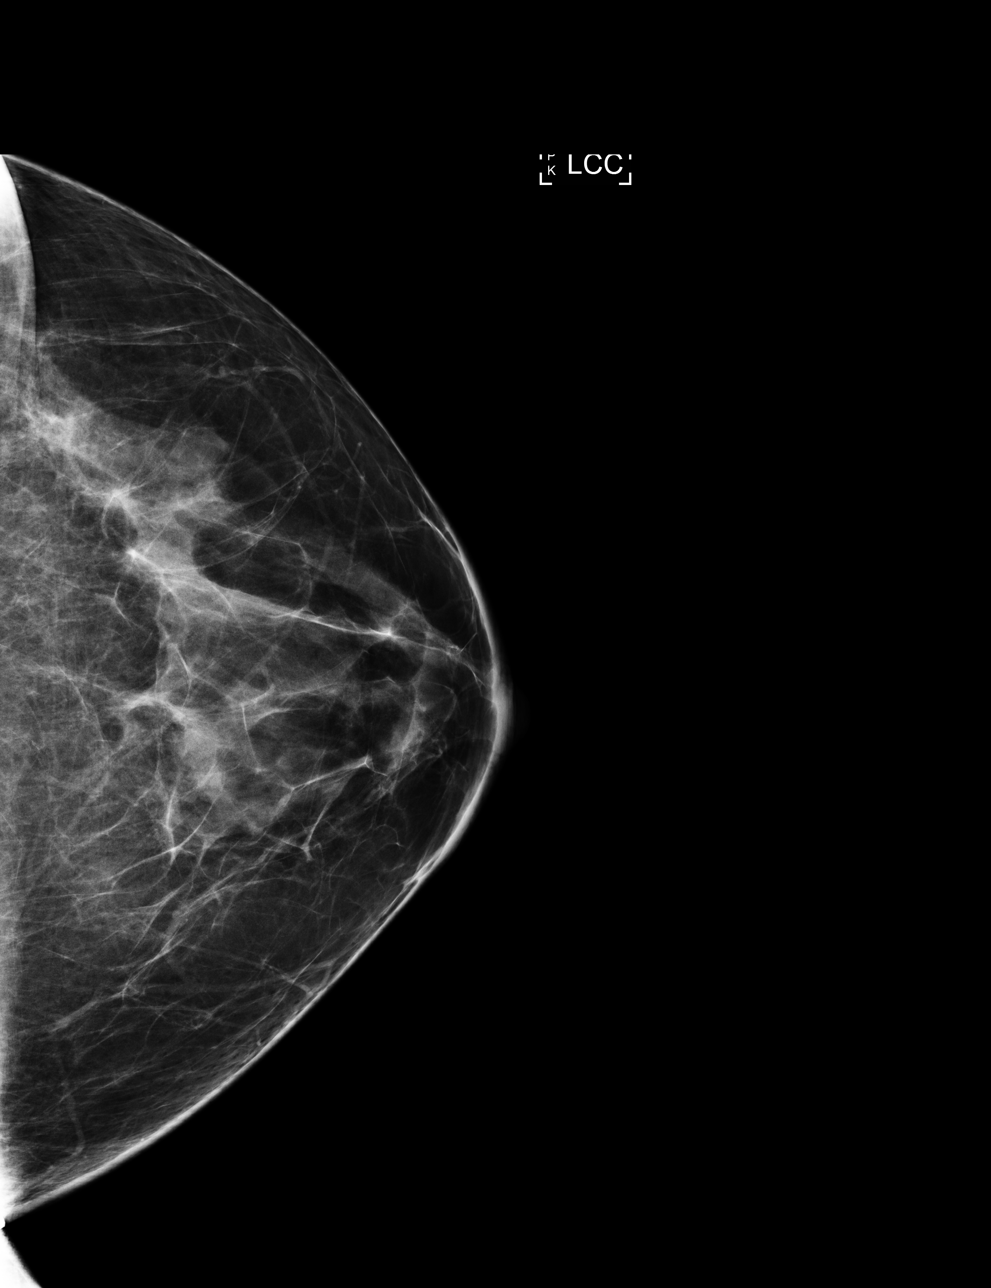

[4 of 4 positions shown; findings below may reference images not displayed]

ACR Breast Density Category c: The breast tissue is heterogeneously
dense, which may obscure small masses
FINDINGS: There are no findings suspicious for malignancy. Images were
processed with CAD.
IMPRESSION: No mammographic evidence of malignancy. A result letter of this
screening mammogram will be mailed directly to the patient.

RECOMMENDATION:
Screening mammogram in one year. (Code:[RG])

BI-RADS CATEGORY  1: Negative.

## 2017-04-20 MED ORDER — GLUCOSE BLOOD VI STRP: ORAL_STRIP | 12 refills | Status: DC

## 2017-04-20 NOTE — Patient Instructions (Addendum)
Please take these settings: basal rate of 0.5 units/hr from 2 AM-3 PM, and 1.6 units/hr 3 PM-2 AM.   No mealtime bolus.   correction bolus (which some people call "sensitivity," or "insulin sensitivity ratio," or just "isr") of 1 unit whenever your blood sugar is over 300.   If you are going to be active, suspend the pump for 1-2 hrs.  check your blood sugar 4 times a day: the 3 meals, and at bedtime.  also check if you have symptoms of your blood sugar being too high or too low.  please keep a record of the readings and bring it to your next appointment here (or you can bring the meter itself).  You can write it on any piece of paper.  please call us sooner if your blood sugar goes below 70, or if you have a lot of readings over 200.   Please give this a try, and let us know how this works.     Please come back for a follow-up appointment in 5 months.

## 2017-04-20 NOTE — Progress Notes (Signed)
Subjective:    Patient ID: Katherine Navarro, female    DOB: 12/02/1955, 61 y.o.   MRN: [id number]  HPI Pt returns for f/u of diabetes mellitus:   DM type: 1  Dx'ed: 1978.   Complications: polyneuropathy, retinopathy and nephropathy.  Therapy: insulin since dx GDM: never DKA: never Severe hypoglycemia: once (1988) Pancreatitis: never.  Other: on pump rx since approx 2005; therapy has been limited by noncompliance with cbg recording, and dosing via her pump; pump settings have been adjusted to emphasize basal rate; she sleeps from 4 AM-11 AM, and takes frequent naps during the day. Interval history: Pt is taking these pump settings: basal rate of 0.6 units/hr, 24 hrs per day (she did not adjust as advised).  No mealtime bolus, but she takes boluses of 3-4 units, 4-5 times per day.  correction bolus (which some people call "sensitivity," or "insulin sensitivity ratio," or just "isr") of 1 unit whenever your blood sugar is over 300.   If she are going to be active, she suspends the pump for 1-2 hrs.  no cbg record, but states cbg's vary widely.  It is mildly low approx twice a week.  She says this happens after the boluses she gives.   Past Medical History:  Diagnosis Date  . CONSTIPATION, CHRONIC 12/13/2007  . DIABETES MELLITUS, TYPE I 01/07/2007  . DM nephropathy/sclerosis   . GLAUCOMA 12/13/2007  . HYPERCHOLESTEROLEMIA 12/13/2007  . HYPERTENSION 01/07/2007  . Hyperthyroidism   . INTERNAL HEMORRHOIDS 07/23/2008  . LEUKOPENIA, CHRONIC 12/13/2007  . Nonproliferative diabetic retinopathy JKK(938.18) 12/13/2007  . Unspecified hypothyroidism 12/13/2007  . VARICOSE VEINS, LOWER EXTREMITIES 12/13/2007    Past Surgical History:  Procedure Laterality Date  . ELECTROCARDIOGRAM  08/24/2006  . EYE SURGERY     bilateral  . LEEP N/A 12/01/2012   Procedure: LOOP ELECTROSURGICAL EXCISION PROCEDURE (LEEP);  Surgeon: Delice Lesch, MD;  Location: [facility];  Service: Gynecology;  Laterality: N/A;  .  Stress Myoview   05/18/2004  . TUBAL LIGATION      Social History   Social History  . Marital status: Married    Spouse name: N/A  . Number of children: N/A  . Years of education: N/A   Occupational History  .      Doesn't work outside the home   Social History Main Topics  . Smoking status: Never Smoker  . Smokeless tobacco: Never Used  . Alcohol use No  . Drug use: No  . Sexual activity: Not on file   Other Topics Concern  . Not on file   Social History Narrative   Pt gets regular exercise.          Current Outpatient Prescriptions on File Prior to Visit  Medication Sig Dispense Refill  . alum & mag hydroxide-simeth (MYLANTA) 200-200-20 MG/5ML suspension as needed.      Marland Kitchen amiloride-hydrochlorothiazide (MODURETIC) 5-50 MG tablet Take 0.5 tablets by mouth daily. 45 tablet 3  . Cod Liver Oil CAPS Take 1 capsule by mouth daily.      . colestipol (COLESTID) 5 g granules Take 5 g by mouth daily. 500 g 3  . dorzolamide-timolol (COSOPT) 22.3-6.8 MG/ML ophthalmic solution 1 drop 2 (two) times daily.    . fluticasone (FLONASE) 50 MCG/ACT nasal spray INSTILL 2 SPRAYS IN BOTH NOSTRILS DAILY 16 g 4  . Glucosamine 500 MG TABS Take 1 tablet by mouth daily.      Marland Kitchen glucose blood (ONE TOUCH ULTRA TEST) test strip USE  AS INSTRUCTED 12 TIMES  A DAY 600 each 3  . hydrocortisone-pramoxine (ANALPRAM-HC) 2.5-1 % rectal cream Place rectally as needed for hemorrhoids. Apply to rectum as needed 30 g 1  . ibuprofen (ADVIL,MOTRIN) 600 MG tablet Take 1 tablet (600 mg total) by mouth every 6 (six) hours as needed for pain. 30 tablet 0  . Insulin Infusion Pump Supplies (MINIMED INFUSION SET-MMT 396) MISC CHANGE EVERY 2 DAYS 50 each 3  . Insulin Infusion Pump Supplies (PARADIGM RESERVOIR 3ML) MISC CHANGE EVERY 2 DAYS 50 each 3  . insulin lispro (HUMALOG) 100 UNIT/ML injection INJECT SUBCUTANEOUSLY 50  UNITS EVERY DAY VIA PUMP. 50 mL 11  . insulin lispro (HUMALOG) 100 UNIT/ML injection Inject  subcutaneously 50  units every day via pump 50 mL 2  . levothyroxine (SYNTHROID, LEVOTHROID) 137 MCG tablet TAKE ONE TABLET BY MOUTH ONCE DAILY BEFORE BREAKFAST **PATIENT  NEEDS  APPOINTMENT  FOR  FURTHER  REFILLS** 30 tablet 0  . levothyroxine (SYNTHROID, LEVOTHROID) 137 MCG tablet TAKE ONE TABLET BY MOUTH ONCE DAILY BEFORE BREAKFAST 90 tablet 1  . magnesium oxide (MAG-OX) 400 MG tablet Take 2 tablets once daily     . nitrofurantoin (MACRODANTIN) 50 MG capsule Take 1 capsule (50 mg total) by mouth 3 (three) times daily. 21 capsule 0  . norethindrone (MICRONOR,CAMILA,ERRIN) 0.35 MG tablet Take 1 tablet by mouth daily.    . vitamin E 400 UNIT capsule Take 2 capsules by mouth twice a day as needed      No current facility-administered medications on file prior to visit.     Allergies  Allergen Reactions  . Atorvastatin     REACTION: perceived myalgias  . Ciprofloxacin     REACTION: pt states med  make her sick  . Epinephrine     REACTION: thyroid problems  . Erythromycin   . Morphine   . Penicillins     REACTION: hives    Family History  Problem Relation Age of Onset  . Cancer Mother        Breast Cancer  . Diabetes Mother   . Diabetes Father     BP (!) 142/74   Pulse 79   Wt 155 lb 6.4 oz (70.5 kg)   SpO2 98%   BMI 26.67 kg/m    Review of Systems She denies LOC.     Objective:   Physical Exam VITAL SIGNS:  See vs page.  GENERAL: no distress.  Pulses: foot pulses are intact bilaterally.   MSK: no deformity of the feet or ankles.  CV: trace bilat edema of the legs or ankles.  Skin:  no ulcer on the feet or ankles.  normal temp on the feet and ankles, but there is patchy hyperpigmentation.  Neuro: sensation is intact to touch on the feet and ankles, but decreased from normal.   A1c=11.6%    Assessment & Plan:  Type 1 DM, with DR: ongoing poor glycemic control.  Noncompliance with cbg recording and insulin.  We discussed risks.  She declines sooner f/u.     Patient Instructions  Please take these settings: basal rate of 0.5 units/hr from 2 AM-3 PM, and 1.6 units/hr 3 PM-2 AM.   No mealtime bolus.   correction bolus (which some people call "sensitivity," or "insulin sensitivity ratio," or just "isr") of 1 unit whenever your blood sugar is over 300.   If you are going to be active, suspend the pump for 1-2 hrs.  check your blood sugar 4 times a  day: the 3 meals, and at bedtime.  also check if you have symptoms of your blood sugar being too high or too low.  please keep a record of the readings and bring it to your next appointment here (or you can bring the meter itself).  You can write it on any piece of paper.  please call us sooner if your blood sugar goes below 70, or if you have a lot of readings over 200.   Please give this a try, and let us know how this works.     Please come back for a follow-up appointment in 5 months.

## 2017-06-03 ENCOUNTER — Other Ambulatory Visit: Payer: Self-pay

## 2017-06-03 MED ORDER — FLUTICASONE PROPIONATE 50 MCG/ACT NA SUSP: NASAL | 11 refills | Status: DC

## 2017-08-12 ENCOUNTER — Telehealth: Payer: Self-pay | Admitting: Endocrinology

## 2017-08-12 NOTE — Telephone Encounter (Signed)
Pt calling to see if Judson Roch will call her please she states it is for refills but she is adament that she must talk with Sarah 1st and will not call the pharmacy because it will be messed up if she does.

## 2017-08-16 NOTE — Telephone Encounter (Signed)
Requested a call back from the patient to discuss further.  

## 2017-08-19 ENCOUNTER — Other Ambulatory Visit: Payer: Self-pay | Admitting: Endocrinology

## 2017-08-23 NOTE — Telephone Encounter (Signed)
I called LVM for patient to call back.

## 2017-09-02 ENCOUNTER — Ambulatory Visit (INDEPENDENT_AMBULATORY_CARE_PROVIDER_SITE_OTHER): Payer: BLUE CROSS/BLUE SHIELD | Admitting: Endocrinology

## 2017-09-02 ENCOUNTER — Other Ambulatory Visit: Payer: Self-pay

## 2017-09-02 ENCOUNTER — Encounter: Payer: Self-pay | Admitting: Endocrinology

## 2017-09-02 VITALS — BP 120/62 | HR 84 | Wt 153.2 lb

## 2017-09-02 DIAGNOSIS — Z0000 Encounter for general adult medical examination without abnormal findings: Secondary | ICD-10-CM

## 2017-09-02 DIAGNOSIS — E1021 Type 1 diabetes mellitus with diabetic nephropathy: Secondary | ICD-10-CM | POA: Diagnosis not present

## 2017-09-02 DIAGNOSIS — Z23 Encounter for immunization: Secondary | ICD-10-CM

## 2017-09-02 LAB — BASIC METABOLIC PANEL
BUN: 31 mg/dL — AB (ref 6–23)
CALCIUM: 10.3 mg/dL (ref 8.4–10.5)
CHLORIDE: 95 meq/L — AB (ref 96–112)
CO2: 26 mEq/L (ref 19–32)
CREATININE: 1.08 mg/dL (ref 0.40–1.20)
GFR: 66.54 mL/min (ref >60.00)
Glucose, Bld: 314 mg/dL — ABNORMAL HIGH (ref 70–99)
Potassium: 3.7 mEq/L (ref 3.5–5.1)
Sodium: 131 mEq/L — ABNORMAL LOW (ref 135–145)

## 2017-09-02 LAB — HEPATIC FUNCTION PANEL
ALT: 16 U/L (ref 0–35)
AST: 21 U/L (ref 0–37)
Albumin: 3.9 g/dL (ref 3.5–5.2)
Alkaline Phosphatase: 77 U/L (ref 39–117)
BILIRUBIN DIRECT: 0.1 mg/dL (ref 0.0–0.3)
TOTAL PROTEIN: 7 g/dL (ref 6.0–8.3)
Total Bilirubin: 0.6 mg/dL (ref 0.2–1.2)

## 2017-09-02 LAB — LIPID PANEL
CHOLESTEROL: 240 mg/dL — AB (ref 0–200)
HDL: 69.9 mg/dL (ref >39.00)
LDL CALC: 149 mg/dL — AB (ref 0–99)
NonHDL: 169.91
TRIGLYCERIDES: 106 mg/dL (ref 0.0–149.0)
Total CHOL/HDL Ratio: 3
VLDL: 21.2 mg/dL (ref 0.0–40.0)

## 2017-09-02 LAB — CBC WITH DIFFERENTIAL/PLATELET
BASOS ABS: 0 10*3/uL (ref 0.0–0.1)
BASOS PCT: 0.4 % (ref 0.0–3.0)
EOS ABS: 0 10*3/uL (ref 0.0–0.7)
Eosinophils Relative: 0.8 % (ref 0.0–5.0)
HEMATOCRIT: 41.7 % (ref 36.0–46.0)
Hemoglobin: 13.5 g/dL (ref 12.0–15.0)
LYMPHS PCT: 26.6 % (ref 12.0–46.0)
Lymphs Abs: 1.6 10*3/uL (ref 0.7–4.0)
MCHC: 32.4 g/dL (ref 30.0–36.0)
MCV: 85.1 fl (ref 78.0–100.0)
MONOS PCT: 7 % (ref 3.0–12.0)
Monocytes Absolute: 0.4 10*3/uL (ref 0.1–1.0)
Neutro Abs: 3.9 10*3/uL (ref 1.4–7.7)
Neutrophils Relative %: 65.2 % (ref 43.0–77.0)
Platelets: 269 10*3/uL (ref 150.0–400.0)
RBC: 4.9 Mil/uL (ref 3.87–5.11)
RDW: 13.4 % (ref 11.5–15.5)
WBC: 6 10*3/uL (ref 4.0–10.5)

## 2017-09-02 LAB — TSH: TSH: 0.07 u[IU]/mL — AB (ref 0.35–4.50)

## 2017-09-02 LAB — HEMOGLOBIN A1C: HEMOGLOBIN A1C: 12.3 % — AB (ref 4.6–6.5)

## 2017-09-02 MED ORDER — GLUCOSE BLOOD VI STRP: ORAL_STRIP | 7 refills | Status: DC

## 2017-09-02 MED ORDER — PARADIGM PUMP RESERVOIR 3ML MISC: 3 refills | Status: DC

## 2017-09-02 MED ORDER — FLUTICASONE PROPIONATE 50 MCG/ACT NA SUSP: NASAL | 11 refills | Status: DC

## 2017-09-02 MED ORDER — INSULIN LISPRO 100 UNIT/ML ~~LOC~~ SOLN: SUBCUTANEOUS | 11 refills | Status: DC

## 2017-09-02 MED ORDER — LEVOTHYROXINE SODIUM 112 MCG PO TABS: 112.0000 ug | ORAL_TABLET | Freq: Every day | ORAL | 3 refills | Status: DC

## 2017-09-02 MED ORDER — "PARADIGM QUICK-SET 43"" 9MM MISC": 3 refills | Status: DC

## 2017-09-02 NOTE — Progress Notes (Signed)
Subjective:    Patient ID: Katherine Navarro, female    DOB: [birth year], 62 y.o.   MRN: [id number]  HPI Pt is here for regular wellness examination, and is feeling pretty well in general, and says chronic med probs are stable, except as noted below Past Medical History:  Diagnosis Date  . CONSTIPATION, CHRONIC 12/13/2007  . DIABETES MELLITUS, TYPE I 01/07/2007  . DM nephropathy/sclerosis   . GLAUCOMA 12/13/2007  . HYPERCHOLESTEROLEMIA 12/13/2007  . HYPERTENSION 01/07/2007  . Hyperthyroidism   . INTERNAL HEMORRHOIDS 07/23/2008  . LEUKOPENIA, CHRONIC 12/13/2007  . Nonproliferative diabetic retinopathy BWG(665.99) 12/13/2007  . Unspecified hypothyroidism 12/13/2007  . VARICOSE VEINS, LOWER EXTREMITIES 12/13/2007    Past Surgical History:  Procedure Laterality Date  . ELECTROCARDIOGRAM  08/24/2006  . EYE SURGERY     bilateral  . LEEP N/A 12/01/2012   Procedure: LOOP ELECTROSURGICAL EXCISION PROCEDURE (LEEP);  Surgeon: Delice Lesch, MD;  Location: [facility];  Service: Gynecology;  Laterality: N/A;  . Stress Myoview   05/18/2004  . TUBAL LIGATION      Social History   Socioeconomic History  . Marital status: Married    Spouse name: Not on file  . Number of children: Not on file  . Years of education: Not on file  . Highest education level: Not on file  Social Needs  . Financial resource strain: Not on file  . Food insecurity - worry: Not on file  . Food insecurity - inability: Not on file  . Transportation needs - medical: Not on file  . Transportation needs - non-medical: Not on file  Occupational History    Comment: Doesn't work outside the home  Tobacco Use  . Smoking status: Never Smoker  . Smokeless tobacco: Never Used  Substance and Sexual Activity  . Alcohol use: No  . Drug use: No  . Sexual activity: Not on file  Other Topics Concern  . Not on file  Social History Narrative   Pt gets regular exercise.          Current Outpatient Medications on File Prior to Visit   Medication Sig Dispense Refill  . alum & mag hydroxide-simeth (MYLANTA) 200-200-20 MG/5ML suspension as needed.      Marland Kitchen amiloride-hydrochlorothiazide (MODURETIC) 5-50 MG tablet Take 0.5 tablets by mouth daily. 45 tablet 3  . amiloride-hydrochlorothiazide (MODURETIC) 5-50 MG tablet TAKE ONE-HALF TABLET BY MOUTH ONCE DAILY 45 tablet 3  . Cod Liver Oil CAPS Take 1 capsule by mouth daily.      . colestipol (COLESTID) 5 g granules Take 5 g by mouth daily. 500 g 3  . dorzolamide-timolol (COSOPT) 22.3-6.8 MG/ML ophthalmic solution 1 drop 2 (two) times daily.    . Glucosamine 500 MG TABS Take 1 tablet by mouth daily.      Marland Kitchen glucose blood (ONE TOUCH ULTRA TEST) test strip Use as instructed 100 each 12  . hydrocortisone-pramoxine (ANALPRAM-HC) 2.5-1 % rectal cream Place rectally as needed for hemorrhoids. Apply to rectum as needed 30 g 1  . ibuprofen (ADVIL,MOTRIN) 600 MG tablet Take 1 tablet (600 mg total) by mouth every 6 (six) hours as needed for pain. 30 tablet 0  . magnesium oxide (MAG-OX) 400 MG tablet Take 2 tablets once daily     . nitrofurantoin (MACRODANTIN) 50 MG capsule Take 1 capsule (50 mg total) by mouth 3 (three) times daily. 21 capsule 0  . norethindrone (MICRONOR,CAMILA,ERRIN) 0.35 MG tablet Take 1 tablet by mouth daily.    . vitamin  E 400 UNIT capsule Take 2 capsules by mouth twice a day as needed      No current facility-administered medications on file prior to visit.     Allergies  Allergen Reactions  . Atorvastatin     REACTION: perceived myalgias  . Ciprofloxacin     REACTION: pt states med  make her sick  . Epinephrine     REACTION: thyroid problems  . Erythromycin   . Morphine   . Penicillins     REACTION: hives    Family History  Problem Relation Age of Onset  . Cancer Mother        Breast Cancer  . Diabetes Mother   . Diabetes Father     BP 120/62 (BP Location: Left Arm, Patient Position: Sitting, Cuff Size: Normal)   Pulse 84   Wt 153 lb 3.2 oz (69.5 kg)    SpO2 99%   BMI 26.30 kg/m     Review of Systems Denies fever, fatigue, visual loss, hearing loss, chest pain, sob, back pain, depression, cold intolerance, BRBPR, hematuria, syncope, numbness, easy bruising, and rash. She has rhinorrhea    Objective:   Physical Exam VS: see vs page GEN: no distress HEAD: head: no deformity eyes: no periorbital swelling; moderate bilat proptosis external nose and ears are normal mouth: no lesion seen NECK: supple, thyroid is not enlarged CHEST WALL: no deformity LUNGS:  Clear to auscultation BREASTS: sees gyn CV: reg rate and rhythm, no murmur ABD: abdomen is soft, nontender.  no hepatosplenomegaly.  not distended.  no hernia GENITALIA/RECTAL: sees gyn MUSCULOSKELETAL: muscle bulk and strength are grossly normal.  no obvious joint swelling.  gait is normal and steady PULSES: no carotid bruit NEURO:  cn 2-12 grossly intact.   readily moves all 4's.   SKIN:  Normal texture and temperature.  No rash or suspicious lesion is visible.   NODES:  None palpable at the neck PSYCH: alert, well-oriented.  Does not appear anxious nor depressed.      Assessment & Plan:  Wellness visit today, with problems stable, except as noted.   SEPARATE EVALUATION FOLLOWS--EACH PROBLEM HERE IS NEW, NOT RESPONDING TO TREATMENT, OR POSES SIGNIFICANT RISK TO THE PATIENT'S HEALTH: HISTORY OF THE PRESENT ILLNESS: Pt returns for f/u of diabetes mellitus:   DM type: 1  Dx'ed: 1978.   Complications: polyneuropathy, retinopathy and nephropathy.  Therapy: insulin since dx GDM: never DKA: never Severe hypoglycemia: once (1988) Pancreatitis: never.  Other: on pump rx since approx 2005; therapy has been limited by noncompliance with cbg recording, and dosing via her pump; pump settings have been adjusted to emphasize basal rate; she sleeps from 4 AM-11 AM, and takes frequent naps during the day. Interval history:pt takes these settings: basal rate of 0.5 units/hr from 2  AM-3 PM, and 1.5 units/hr 3 PM-2 AM.   No mealtime bolus.   correction bolus (which some people call "sensitivity," or "insulin sensitivity ratio," or just "isr") of 1 unit whenever your blood sugar is over 300.   If you are going to be active, suspend the pump for 1-2 hrs.  no cbg record, but states cbg's varies from 72-400. PAST MEDICAL HISTORY Past Medical History:  Diagnosis Date  . CONSTIPATION, CHRONIC 12/13/2007  . DIABETES MELLITUS, TYPE I 01/07/2007  . DM nephropathy/sclerosis   . GLAUCOMA 12/13/2007  . HYPERCHOLESTEROLEMIA 12/13/2007  . HYPERTENSION 01/07/2007  . Hyperthyroidism   . INTERNAL HEMORRHOIDS 07/23/2008  . LEUKOPENIA, CHRONIC 12/13/2007  . Nonproliferative diabetic retinopathy  ZDG(644.03) 12/13/2007  . Unspecified hypothyroidism 12/13/2007  . VARICOSE VEINS, LOWER EXTREMITIES 12/13/2007    Past Surgical History:  Procedure Laterality Date  . ELECTROCARDIOGRAM  08/24/2006  . EYE SURGERY     bilateral  . LEEP N/A 12/01/2012   Procedure: LOOP ELECTROSURGICAL EXCISION PROCEDURE (LEEP);  Surgeon: Delice Lesch, MD;  Location: [facility];  Service: Gynecology;  Laterality: N/A;  . Stress Myoview   05/18/2004  . TUBAL LIGATION      Social History   Socioeconomic History  . Marital status: Married    Spouse name: Not on file  . Number of children: Not on file  . Years of education: Not on file  . Highest education level: Not on file  Social Needs  . Financial resource strain: Not on file  . Food insecurity - worry: Not on file  . Food insecurity - inability: Not on file  . Transportation needs - medical: Not on file  . Transportation needs - non-medical: Not on file  Occupational History    Comment: Doesn't work outside the home  Tobacco Use  . Smoking status: Never Smoker  . Smokeless tobacco: Never Used  Substance and Sexual Activity  . Alcohol use: No  . Drug use: No  . Sexual activity: Not on file  Other Topics Concern  . Not on file  Social History  Narrative   Pt gets regular exercise.          Current Outpatient Medications on File Prior to Visit  Medication Sig Dispense Refill  . alum & mag hydroxide-simeth (MYLANTA) 200-200-20 MG/5ML suspension as needed.      Marland Kitchen amiloride-hydrochlorothiazide (MODURETIC) 5-50 MG tablet Take 0.5 tablets by mouth daily. 45 tablet 3  . amiloride-hydrochlorothiazide (MODURETIC) 5-50 MG tablet TAKE ONE-HALF TABLET BY MOUTH ONCE DAILY 45 tablet 3  . Cod Liver Oil CAPS Take 1 capsule by mouth daily.      . colestipol (COLESTID) 5 g granules Take 5 g by mouth daily. 500 g 3  . dorzolamide-timolol (COSOPT) 22.3-6.8 MG/ML ophthalmic solution 1 drop 2 (two) times daily.    . Glucosamine 500 MG TABS Take 1 tablet by mouth daily.      Marland Kitchen glucose blood (ONE TOUCH ULTRA TEST) test strip Use as instructed 100 each 12  . hydrocortisone-pramoxine (ANALPRAM-HC) 2.5-1 % rectal cream Place rectally as needed for hemorrhoids. Apply to rectum as needed 30 g 1  . ibuprofen (ADVIL,MOTRIN) 600 MG tablet Take 1 tablet (600 mg total) by mouth every 6 (six) hours as needed for pain. 30 tablet 0  . magnesium oxide (MAG-OX) 400 MG tablet Take 2 tablets once daily     . nitrofurantoin (MACRODANTIN) 50 MG capsule Take 1 capsule (50 mg total) by mouth 3 (three) times daily. 21 capsule 0  . norethindrone (MICRONOR,CAMILA,ERRIN) 0.35 MG tablet Take 1 tablet by mouth daily.    . vitamin E 400 UNIT capsule Take 2 capsules by mouth twice a day as needed      No current facility-administered medications on file prior to visit.     Allergies  Allergen Reactions  . Atorvastatin     REACTION: perceived myalgias  . Ciprofloxacin     REACTION: pt states med  make her sick  . Epinephrine     REACTION: thyroid problems  . Erythromycin   . Morphine   . Penicillins     REACTION: hives    Family History  Problem Relation Age of Onset  . Cancer Mother  Breast Cancer  . Diabetes Mother   . Diabetes Father     BP 120/62 (BP  Location: Left Arm, Patient Position: Sitting, Cuff Size: Normal)   Pulse 84   Wt 153 lb 3.2 oz (69.5 kg)   SpO2 99%   BMI 26.30 kg/m   REVIEW OF SYSTEMS: She denies hypoglycemia PHYSICAL EXAMINATION: VITAL SIGNS:  See vs page GENERAL: no distress Pulses: foot pulses are intact bilaterally.   MSK: no deformity of the feet or ankles.  CV: trace bilat edema of the legs or ankles.  Skin:  no ulcer on the feet or ankles.  normal temp on the feet and ankles, but there is patchy hyperpigmentation.  Neuro: sensation is intact to touch on the feet and ankles, but decreased from normal.  LAB/XRAY RESULTS: Lab Results  Component Value Date   HGBA1C 12.3 (H) 09/02/2017  IMPRESSION: Type 1 DM: worse Hypothyroidism, overreplaced PLAN:  Please take these settings basal rate of 0.5 units/hr from 2 AM-3 PM, and 2 units/hr 3 PM-2 AM.  No mealtime bolus.  correction bolus (which some people call "sensitivity," or "insulin sensitivity ratio," or just "isr") of 1 unit whenever your blood sugar is over 300 I have sent a prescription to your pharmacy, to reduce synthroid/

## 2017-09-02 NOTE — Patient Instructions (Addendum)
blood tests are requested for you today.  We'll let you know about the results. Until we know the a1c result, please take these settings: basal rate of 0.5 units/hr from 2 AM-3 PM, and 1.6 units/hr 3 PM-2 AM.   No mealtime bolus.   correction bolus (which some people call "sensitivity," or "insulin sensitivity ratio," or just "isr") of 1 unit whenever your blood sugar is over 300.   If you are going to be active, suspend the pump for 1-2 hrs.  check your blood sugar 4 times a day: the 3 meals, and at bedtime.  also check if you have symptoms of your blood sugar being too high or too low.  please keep a record of the readings and bring it to your next appointment here (or you can bring the meter itself).  You can write it on any piece of paper.  please call us sooner if your blood sugar goes below 70, or if you have a lot of readings over 200.  blood tests are requested for you today.  We'll let you know about the results.   Please consider these measures for your health:  minimize alcohol.  Do not use tobacco products.  Have a colonoscopy at least every 10 years from age 28.  Women should have an annual mammogram from age 2.  Keep firearms safely stored.  Always use seat belts.  have working smoke alarms in your home.  See an eye doctor and dentist regularly.  Never drive under the influence of alcohol or drugs (including prescription drugs).   Please come back for a follow-up appointment in 3 months.

## 2017-09-06 ENCOUNTER — Telehealth: Payer: Self-pay | Admitting: Endocrinology

## 2017-09-06 NOTE — Telephone Encounter (Signed)
Patient is returning call.  °

## 2017-09-07 ENCOUNTER — Telehealth: Payer: Self-pay | Admitting: Endocrinology

## 2017-09-07 NOTE — Telephone Encounter (Signed)
please call patient: We need to reduce the thyroid pill. I have sent a prescription to your pharmacy a1c is really high. Please take these settings: basal rate of 0.5 units/hr from 2 AM-3 PM, and 2 units/hr 3 PM-2 AM.  No mealtime bolus.  correction bolus (which some people call "sensitivity," or "insulin sensitivity ratio," or just "isr") of 1 unit whenever your blood sugar is over 300 Please come back for a follow-up appointment in 2 months. Patient advised of results and voiced understanding.

## 2017-09-07 NOTE — Telephone Encounter (Signed)
Patient is returning call from office. ° °

## 2017-09-13 ENCOUNTER — Telehealth: Payer: Self-pay | Admitting: Endocrinology

## 2017-09-13 NOTE — Telephone Encounter (Signed)
Patient asked you to call her, she want you to ask Dr Loanne Drilling a question about her medication. She didn't tell me what it was about. Just want you to call her.  [phone]

## 2017-09-14 ENCOUNTER — Telehealth: Payer: Self-pay | Admitting: Endocrinology

## 2017-09-14 NOTE — Telephone Encounter (Signed)
Patient called and asked to disregard message.

## 2017-09-14 NOTE — Telephone Encounter (Signed)
Disregard previous message from 09/13/17.

## 2017-11-08 DIAGNOSIS — E113513 Type 2 diabetes mellitus with proliferative diabetic retinopathy with macular edema, bilateral: Secondary | ICD-10-CM | POA: Diagnosis not present

## 2017-11-08 DIAGNOSIS — H3343 Traction detachment of retina, bilateral: Secondary | ICD-10-CM | POA: Diagnosis not present

## 2017-11-08 DIAGNOSIS — E0500 Thyrotoxicosis with diffuse goiter without thyrotoxic crisis or storm: Secondary | ICD-10-CM | POA: Diagnosis not present

## 2017-11-08 DIAGNOSIS — Z961 Presence of intraocular lens: Secondary | ICD-10-CM | POA: Diagnosis not present

## 2017-12-16 ENCOUNTER — Ambulatory Visit (INDEPENDENT_AMBULATORY_CARE_PROVIDER_SITE_OTHER): Payer: BLUE CROSS/BLUE SHIELD | Admitting: Endocrinology

## 2017-12-16 ENCOUNTER — Encounter: Payer: Self-pay | Admitting: Endocrinology

## 2017-12-16 VITALS — BP 122/62 | HR 81 | Ht 64.0 in | Wt 156.2 lb

## 2017-12-16 DIAGNOSIS — Z23 Encounter for immunization: Secondary | ICD-10-CM

## 2017-12-16 DIAGNOSIS — E1021 Type 1 diabetes mellitus with diabetic nephropathy: Secondary | ICD-10-CM | POA: Diagnosis not present

## 2017-12-16 DIAGNOSIS — Z0000 Encounter for general adult medical examination without abnormal findings: Secondary | ICD-10-CM

## 2017-12-16 DIAGNOSIS — E039 Hypothyroidism, unspecified: Secondary | ICD-10-CM

## 2017-12-16 LAB — URINALYSIS, ROUTINE W REFLEX MICROSCOPIC
BILIRUBIN URINE: NEGATIVE
KETONES UR: NEGATIVE
NITRITE: NEGATIVE
Specific Gravity, Urine: 1.025 (ref 1.000–1.030)
TOTAL PROTEIN, URINE-UPE24: NEGATIVE
Urine Glucose: >=1000 — AB
Urobilinogen, UA: 0.2 (ref 0.0–1.0)
pH: 5.5 (ref 5.0–8.0)

## 2017-12-16 LAB — POCT GLYCOSYLATED HEMOGLOBIN (HGB A1C): Hemoglobin A1C: 12.2 % — AB (ref 4.0–5.6)

## 2017-12-16 LAB — MICROALBUMIN / CREATININE URINE RATIO
Creatinine,U: 86.8 mg/dL
Microalb Creat Ratio: 4.2 mg/g (ref 0.0–30.0)
Microalb, Ur: 3.7 mg/dL — ABNORMAL HIGH (ref 0.0–1.9)

## 2017-12-16 LAB — TSH: TSH: 1.16 u[IU]/mL (ref 0.35–4.50)

## 2017-12-16 NOTE — Patient Instructions (Addendum)
blood tests are requested for you today.  We'll let you know about the results. please take these settings basal rate of 0.5 units/hr from 2 AM-2 PM, and 1.5 units/hr 2 PM-2 AM.   No mealtime bolus.   correction bolus (which some people call "sensitivity," or "insulin sensitivity ratio," or just "isr") of 1 unit whenever your blood sugar is over 300.  You can do as often as every hour if necessary.   If you are going to be active, suspend the pump for 1-2 hrs.  check your blood sugar 4 times a day: the 3 meals, and at bedtime.  also check if you have symptoms of your blood sugar being too high or too low.  please keep a record of the readings and bring it to your next appointment here (or you can bring the meter itself).  You can write it on any piece of paper.  please call us sooner if your blood sugar goes below 70, or if you have a lot of readings over 200.  Please come back for a follow-up appointment in 2 months.

## 2017-12-16 NOTE — Progress Notes (Signed)
Subjective:    Patient ID: Katherine Navarro, female    DOB: 12-Sep-1955, 62 y.o.   MRN: [id number]  HPI Pt returns for f/u of diabetes mellitus:   DM type: 1  Dx'ed: 1978.   Complications: polyneuropathy, retinopathy and nephropathy.   Therapy: insulin since dx GDM: never DKA: never Severe hypoglycemia: once (1988) Pancreatitis: never.  Other: on pump rx (now minimed paradigm) since approx 2005; therapy has been limited by noncompliance with cbg recording, and dosing via her pump; pump settings have been adjusted to emphasize basal rate; she sleeps from 4 AM-11 AM, and takes frequent naps during the day.   Interval history:pt takes these settings: basal rate of 0.55 units/hr, 24 hrs per day She takes a widely varying bolus amount.  correction bolus (which some people call "sensitivity," or "insulin sensitivity ratio," or just "isr") of 1 unit whenever your blood sugar is over 300.  pt states she feels well in general.  Meter is downloaded today, and the printout is scanned into the record.  cbg varies from 150-400.  There is no trend throughout the day.  states cbg's are mildly low approx twice per week.  This can happen at any time of day.   Past Medical History:  Diagnosis Date  . CONSTIPATION, CHRONIC 12/13/2007  . DIABETES MELLITUS, TYPE I 01/07/2007  . DM nephropathy/sclerosis   . GLAUCOMA 12/13/2007  . HYPERCHOLESTEROLEMIA 12/13/2007  . HYPERTENSION 01/07/2007  . Hyperthyroidism   . INTERNAL HEMORRHOIDS 07/23/2008  . LEUKOPENIA, CHRONIC 12/13/2007  . Nonproliferative diabetic retinopathy [facility](947.09) 12/13/2007  . Unspecified hypothyroidism 12/13/2007  . VARICOSE VEINS, LOWER EXTREMITIES 12/13/2007    Past Surgical History:  Procedure Laterality Date  . ELECTROCARDIOGRAM  08/24/2006  . EYE SURGERY     bilateral  . LEEP N/A 12/01/2012   Procedure: LOOP ELECTROSURGICAL EXCISION PROCEDURE (LEEP);  Surgeon: Delice Lesch, MD;  Location: [facility];  Service: Gynecology;  Laterality:  N/A;  . Stress Myoview   05/18/2004  . TUBAL LIGATION      Social History   Socioeconomic History  . Marital status: Married    Spouse name: Not on file  . Number of children: Not on file  . Years of education: Not on file  . Highest education level: Not on file  Occupational History    Comment: Doesn't work outside the home  Social Needs  . Financial resource strain: Not on file  . Food insecurity:    Worry: Not on file    Inability: Not on file  . Transportation needs:    Medical: Not on file    Non-medical: Not on file  Tobacco Use  . Smoking status: Never Smoker  . Smokeless tobacco: Never Used  Substance and Sexual Activity  . Alcohol use: No  . Drug use: No  . Sexual activity: Not on file  Lifestyle  . Physical activity:    Days per week: Not on file    Minutes per session: Not on file  . Stress: Not on file  Relationships  . Social connections:    Talks on phone: Not on file    Gets together: Not on file    Attends religious service: Not on file    Active member of club or organization: Not on file    Attends meetings of clubs or organizations: Not on file    Relationship status: Not on file  . Intimate partner violence:    Fear of current or ex partner: Not on file  Emotionally abused: Not on file    Physically abused: Not on file    Forced sexual activity: Not on file  Other Topics Concern  . Not on file  Social History Narrative   Pt gets regular exercise.          Current Outpatient Medications on File Prior to Visit  Medication Sig Dispense Refill  . alum & mag hydroxide-simeth (MYLANTA) 200-200-20 MG/5ML suspension as needed.      Marland Kitchen amiloride-hydrochlorothiazide (MODURETIC) 5-50 MG tablet Take 0.5 tablets by mouth daily. 45 tablet 3  . amiloride-hydrochlorothiazide (MODURETIC) 5-50 MG tablet TAKE ONE-HALF TABLET BY MOUTH ONCE DAILY 45 tablet 3  . Cod Liver Oil CAPS Take 1 capsule by mouth daily.      . colestipol (COLESTID) 5 g granules  Take 5 g by mouth daily. 500 g 3  . dorzolamide-timolol (COSOPT) 22.3-6.8 MG/ML ophthalmic solution 1 drop 2 (two) times daily.    . fluticasone (FLONASE) 50 MCG/ACT nasal spray INSTILL 2 SPRAYS IN BOTH NOSTRILS DAILY 16 g 11  . Glucosamine 500 MG TABS Take 1 tablet by mouth daily.      Marland Kitchen glucose blood (ONE TOUCH ULTRA TEST) test strip Use as instructed 100 each 12  . glucose blood (ONE TOUCH ULTRA TEST) test strip USE AS INSTRUCTED 12 TIMES  A DAY 600 each 7  . hydrocortisone-pramoxine (ANALPRAM-HC) 2.5-1 % rectal cream Place rectally as needed for hemorrhoids. Apply to rectum as needed 30 g 1  . ibuprofen (ADVIL,MOTRIN) 600 MG tablet Take 1 tablet (600 mg total) by mouth every 6 (six) hours as needed for pain. 30 tablet 0  . Insulin Infusion Pump Supplies (MINIMED INFUSION SET-MMT 396) MISC CHANGE EVERY 2 DAYS 50 each 3  . Insulin Infusion Pump Supplies (PARADIGM RESERVOIR 3ML) MISC CHANGE EVERY 2 DAYS 50 each 3  . insulin lispro (HUMALOG) 100 UNIT/ML injection INJECT SUBCUTANEOUSLY 50  UNITS EVERY DAY VIA PUMP. 50 mL 11  . levothyroxine (SYNTHROID, LEVOTHROID) 112 MCG tablet Take 1 tablet (112 mcg total) by mouth daily before breakfast. 90 tablet 3  . magnesium oxide (MAG-OX) 400 MG tablet Take 2 tablets once daily     . nitrofurantoin (MACRODANTIN) 50 MG capsule Take 1 capsule (50 mg total) by mouth 3 (three) times daily. 21 capsule 0  . norethindrone (MICRONOR,CAMILA,ERRIN) 0.35 MG tablet Take 1 tablet by mouth daily.    . vitamin E 400 UNIT capsule Take 2 capsules by mouth twice a day as needed      No current facility-administered medications on file prior to visit.     Allergies  Allergen Reactions  . Atorvastatin     REACTION: perceived myalgias  . Ciprofloxacin     REACTION: pt states med  make her sick  . Epinephrine     REACTION: thyroid problems  . Erythromycin   . Morphine   . Penicillins     REACTION: hives    Family History  Problem Relation Age of Onset  . Cancer  Mother        Breast Cancer  . Diabetes Mother   . Diabetes Father     BP 122/62 (BP Location: Right Arm, Patient Position: Sitting, Cuff Size: Normal)   Pulse 81   Ht 5\' 4"  (1.626 m)   Wt 156 lb 3.2 oz (70.9 kg)   SpO2 99%   BMI 26.81 kg/m   Review of Systems She has gained weight.      Objective:   Physical Exam VITAL  SIGNS:  See vs page GENERAL: no distress Pulses: foot pulses are intact bilaterally.   MSK: no deformity of the feet or ankles.  CV: trace bilat edema of the legs or ankles.  Skin:  no ulcer on the feet or ankles.  normal temp on the feet and ankles, but there is patchy hyperpigmentation.  Neuro: sensation is intact to touch on the feet and ankles, but decreased from normal.   Lab Results  Component Value Date   HGBA1C 12.2 (A) 12/16/2017      Assessment & Plan:  Type 1 DM, with DR: I advised her to d/c pump, but she declines.  Noncompliance with cbg recording and pump settings: I'll work around this as best I can.  Hypoglycemia: we discussed rationale for the pump settings as listed below.   Patient Instructions  blood tests are requested for you today.  We'll let you know about the results. please take these settings basal rate of 0.5 units/hr from 2 AM-2 PM, and 1.5 units/hr 2 PM-2 AM.   No mealtime bolus.   correction bolus (which some people call "sensitivity," or "insulin sensitivity ratio," or just "isr") of 1 unit whenever your blood sugar is over 300.  You can do as often as every hour if necessary.   If you are going to be active, suspend the pump for 1-2 hrs.  check your blood sugar 4 times a day: the 3 meals, and at bedtime.  also check if you have symptoms of your blood sugar being too high or too low.  please keep a record of the readings and bring it to your next appointment here (or you can bring the meter itself).  You can write it on any piece of paper.  please call us sooner if your blood sugar goes below 70, or if you have a lot of  readings over 200.  Please come back for a follow-up appointment in 2 months.

## 2017-12-19 ENCOUNTER — Telehealth: Payer: Self-pay | Admitting: Endocrinology

## 2017-12-19 ENCOUNTER — Other Ambulatory Visit: Payer: Self-pay | Admitting: Endocrinology

## 2017-12-19 MED ORDER — NITROFURANTOIN MACROCRYSTAL 50 MG PO CAPS: 50.0000 mg | ORAL_CAPSULE | Freq: Three times a day (TID) | ORAL | 0 refills | Status: DC

## 2017-12-19 NOTE — Telephone Encounter (Signed)
Patient stated she spoke with tasha earlier today and wanted to let her know that she is having symptoms now and would like a prescription sent in for UTI.

## 2017-12-19 NOTE — Telephone Encounter (Signed)
error 

## 2017-12-19 NOTE — Telephone Encounter (Signed)
LVM with patient that a prescription was sent to [facility] [facility]. I asked if that needs to be changed she give [place] a call back.

## 2017-12-19 NOTE — Telephone Encounter (Signed)
OK, I have sent a prescription to your pharmacy.  

## 2017-12-20 ENCOUNTER — Ambulatory Visit: Payer: BLUE CROSS/BLUE SHIELD | Admitting: Endocrinology

## 2018-02-16 ENCOUNTER — Other Ambulatory Visit: Payer: Self-pay | Admitting: Obstetrics and Gynecology

## 2018-02-16 DIAGNOSIS — Z1231 Encounter for screening mammogram for malignant neoplasm of breast: Secondary | ICD-10-CM

## 2018-04-12 ENCOUNTER — Other Ambulatory Visit: Payer: Self-pay

## 2018-04-12 ENCOUNTER — Telehealth: Payer: Self-pay

## 2018-04-12 MED ORDER — AMILORIDE-HYDROCHLOROTHIAZIDE 5-50 MG PO TABS: 0.5000 | ORAL_TABLET | Freq: Every day | ORAL | 3 refills | Status: DC

## 2018-04-12 MED ORDER — LEVOTHYROXINE SODIUM 112 MCG PO TABS: 112.0000 ug | ORAL_TABLET | Freq: Every day | ORAL | 3 refills | Status: DC

## 2018-04-12 NOTE — Telephone Encounter (Signed)
Received communication from [facility] stating Amiloride-hydrochlorothiazide is on back order with no release date. Dr. Loanne Drilling would like to know if there is an alternate pharmacy she would like to use. LVM requesting returned call.

## 2018-04-12 NOTE — Telephone Encounter (Signed)
This has been taken care of. Thanks

## 2018-04-12 NOTE — Telephone Encounter (Signed)
[facility] on [address]  levothyroxine (SYNTHROID, LEVOTHROID) 112 MCG tablet also wants refill on. Please Advise.

## 2018-04-21 ENCOUNTER — Ambulatory Visit
Admission: RE | Admit: 2018-04-21 | Discharge: 2018-04-21 | Disposition: A | Discharge status: Home / Self Care | Payer: BLUE CROSS/BLUE SHIELD | Source: Ambulatory Visit | Attending: Obstetrics and Gynecology | Admitting: Obstetrics and Gynecology

## 2018-04-21 DIAGNOSIS — Z1231 Encounter for screening mammogram for malignant neoplasm of breast: Secondary | ICD-10-CM | POA: Diagnosis not present

## 2018-04-21 IMAGING — MG DIGITAL SCREENING BILATERAL MAMMOGRAM WITH TOMO AND CAD
6 of 10 series · 6 of 30 positions shown · non-contrast
Comparison: Previous exam(s).

CLINICAL DATA: Screening.

EXAM:
DIGITAL SCREENING BILATERAL MAMMOGRAM WITH TOMO AND CAD

[L MLO synth-2D]
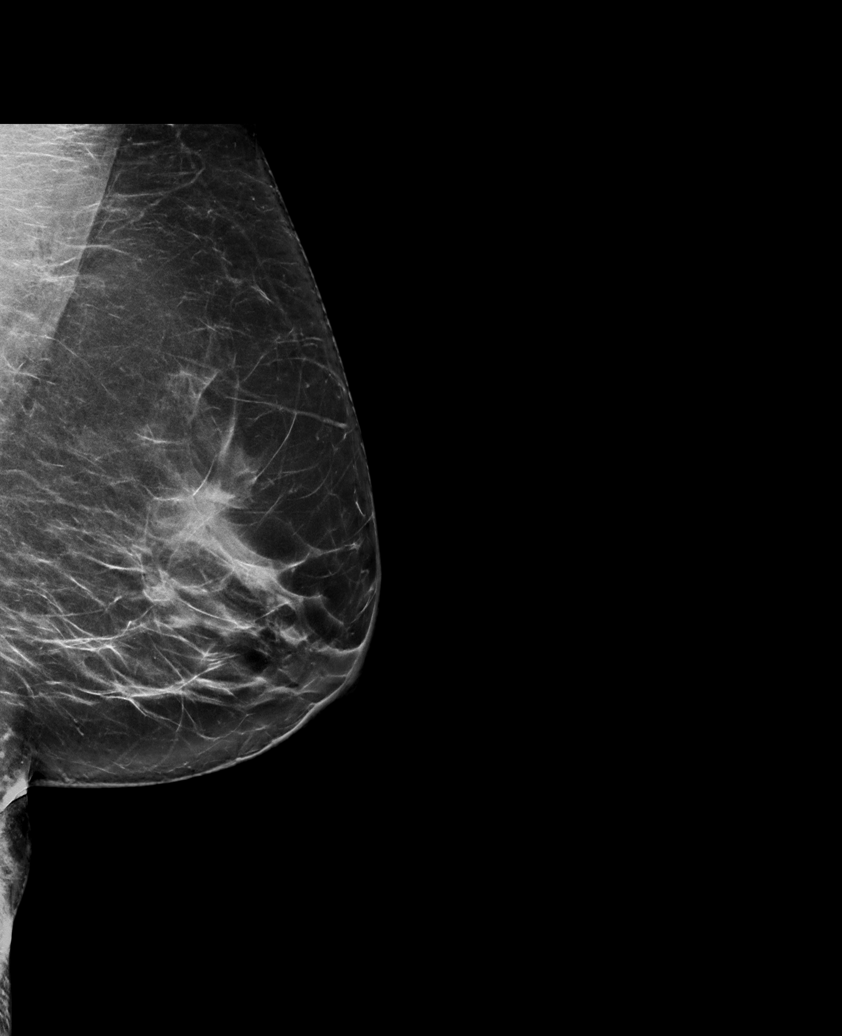

[R CC synth-2D]
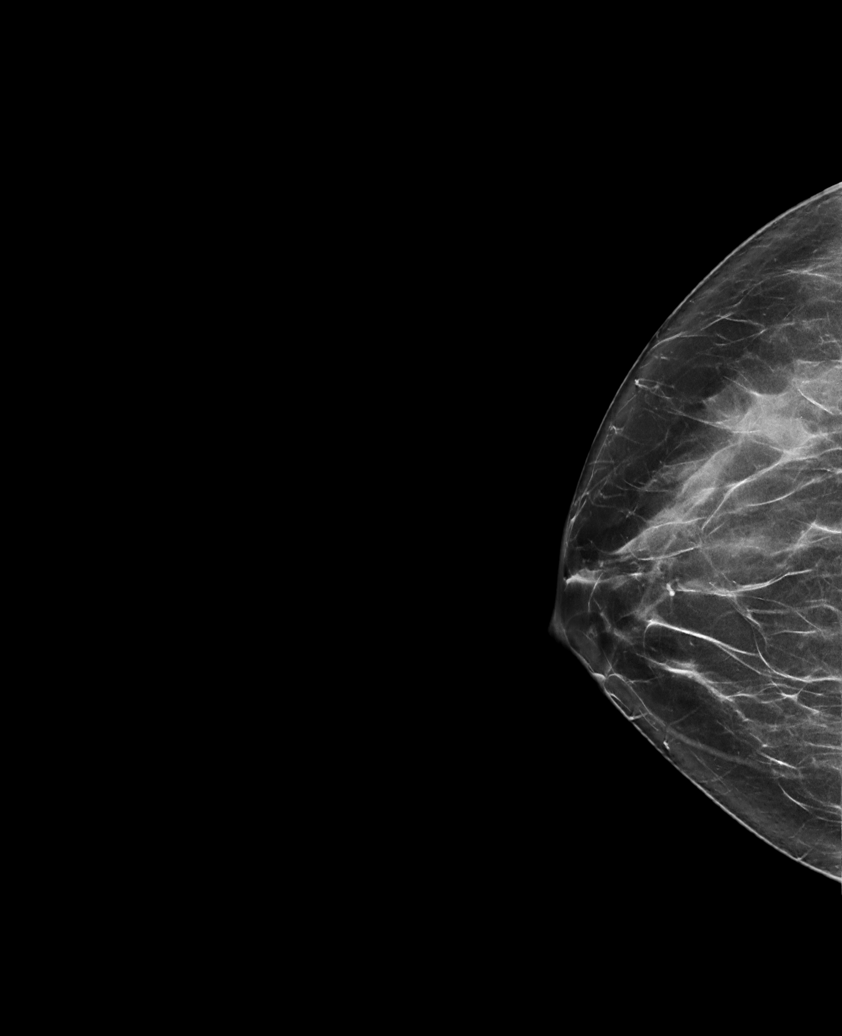

[R MLO synth-2D (1 of 2)]
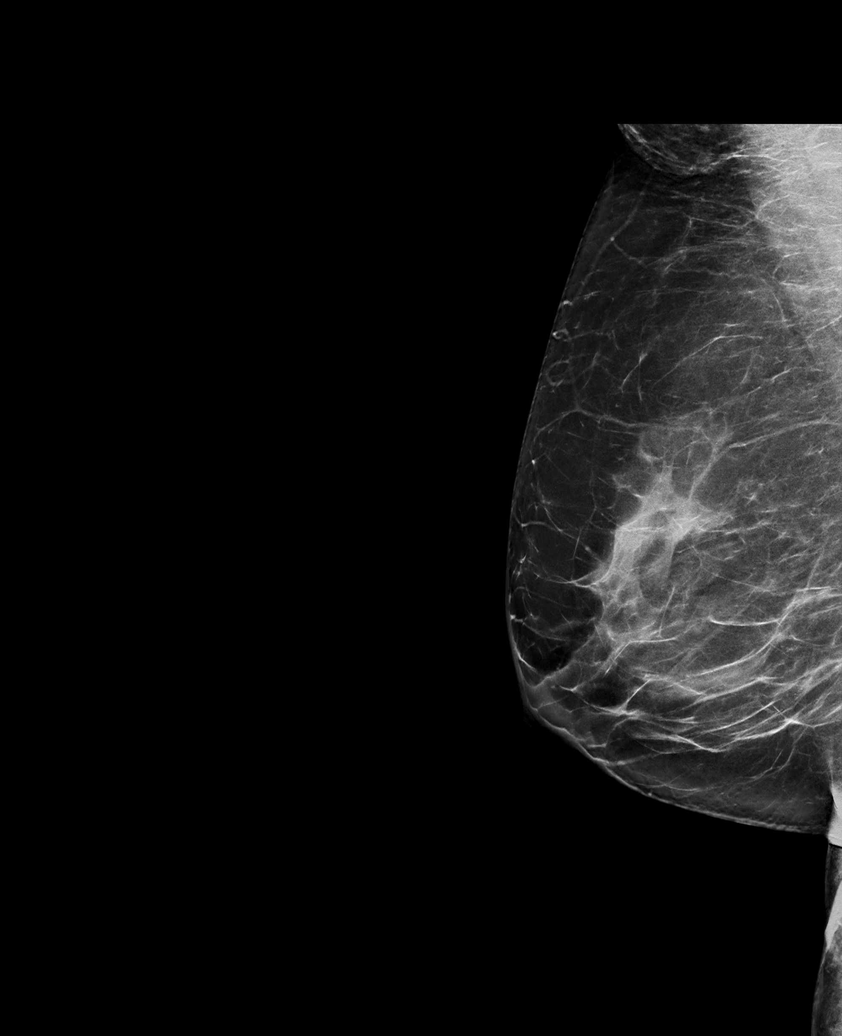

[R MLO synth-2D (2 of 2)]
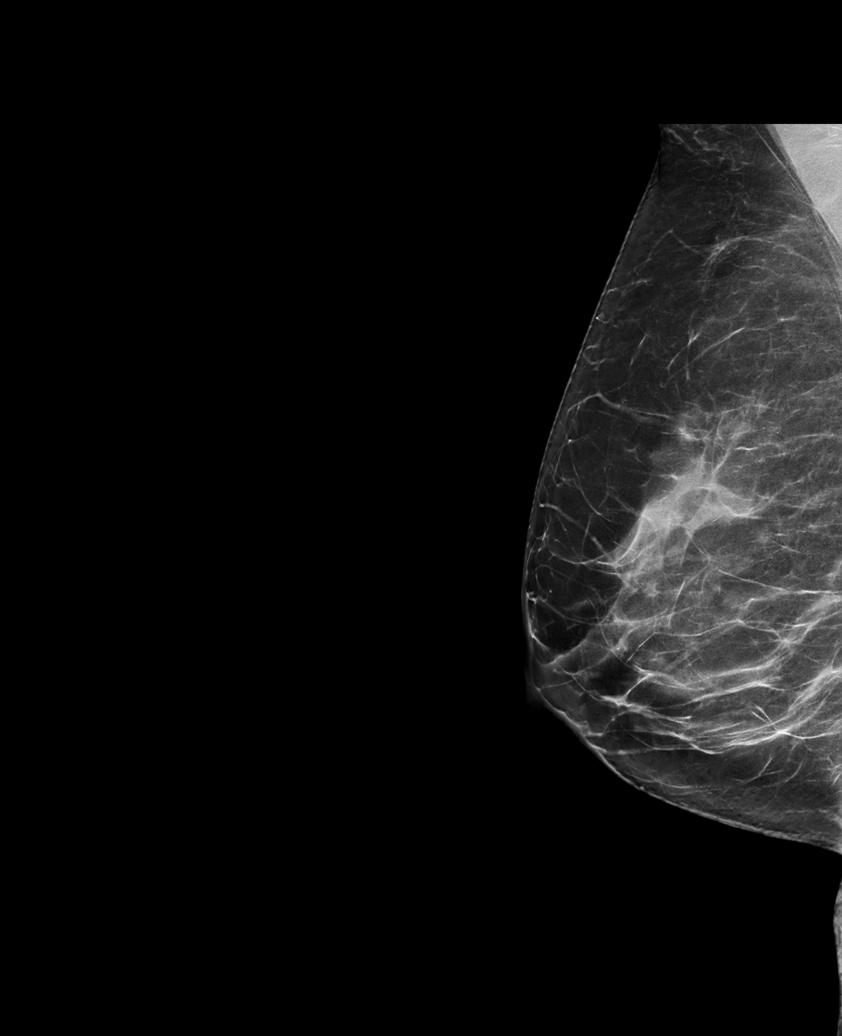

[L CC synth-2D]
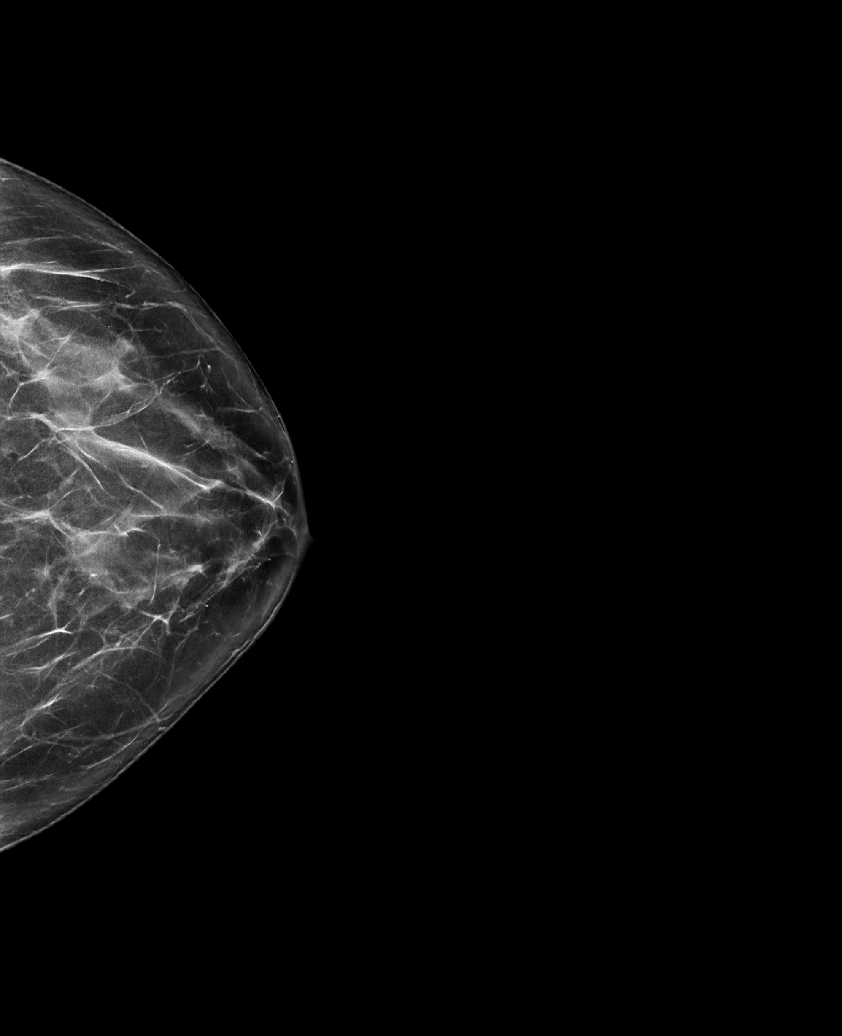

[R MLO tomo · tomo slice 43/86.0]
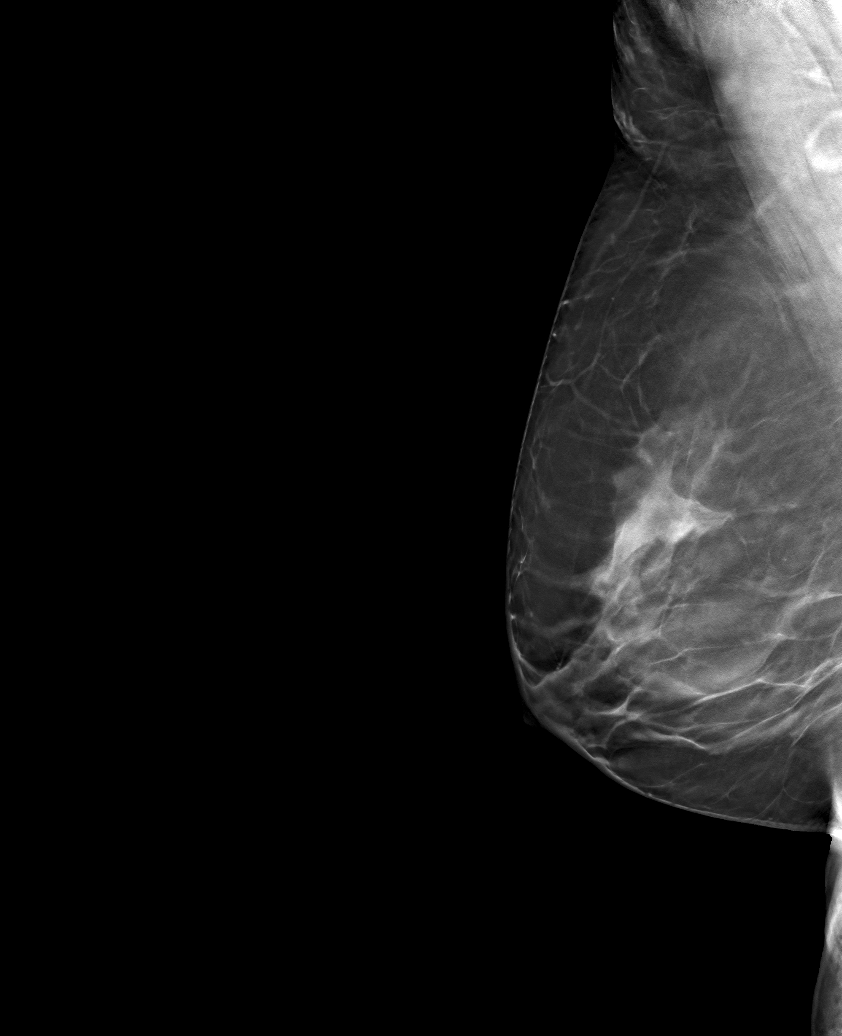

[6 of 30 positions shown; findings below may reference images not displayed]

ACR Breast Density Category c: The breast tissue is heterogeneously
dense, which may obscure small masses.
FINDINGS: There are no findings suspicious for malignancy. Images were
processed with CAD.
IMPRESSION: No mammographic evidence of malignancy. A result letter of this
screening mammogram will be mailed directly to the patient.

RECOMMENDATION:
Screening mammogram in one year. (Code:[5V])

BI-RADS CATEGORY  1: Negative.

## 2018-05-11 ENCOUNTER — Ambulatory Visit: Payer: BLUE CROSS/BLUE SHIELD | Admitting: Endocrinology

## 2018-07-17 DIAGNOSIS — D069 Carcinoma in situ of cervix, unspecified: Secondary | ICD-10-CM | POA: Diagnosis not present

## 2018-07-17 DIAGNOSIS — Z862 Personal history of diseases of the blood and blood-forming organs and certain disorders involving the immune mechanism: Secondary | ICD-10-CM | POA: Diagnosis not present

## 2018-07-17 DIAGNOSIS — Z01419 Encounter for gynecological examination (general) (routine) without abnormal findings: Secondary | ICD-10-CM | POA: Diagnosis not present

## 2018-07-17 DIAGNOSIS — N951 Menopausal and female climacteric states: Secondary | ICD-10-CM | POA: Diagnosis not present

## 2018-07-25 DIAGNOSIS — E0500 Thyrotoxicosis with diffuse goiter without thyrotoxic crisis or storm: Secondary | ICD-10-CM | POA: Diagnosis not present

## 2018-07-25 DIAGNOSIS — H4089 Other specified glaucoma: Secondary | ICD-10-CM | POA: Diagnosis not present

## 2018-07-25 DIAGNOSIS — H3343 Traction detachment of retina, bilateral: Secondary | ICD-10-CM | POA: Diagnosis not present

## 2018-07-25 DIAGNOSIS — E113513 Type 2 diabetes mellitus with proliferative diabetic retinopathy with macular edema, bilateral: Secondary | ICD-10-CM | POA: Diagnosis not present

## 2018-08-23 DIAGNOSIS — H524 Presbyopia: Secondary | ICD-10-CM | POA: Diagnosis not present

## 2018-08-23 DIAGNOSIS — H52201 Unspecified astigmatism, right eye: Secondary | ICD-10-CM | POA: Diagnosis not present

## 2018-08-23 DIAGNOSIS — H5201 Hypermetropia, right eye: Secondary | ICD-10-CM | POA: Diagnosis not present

## 2018-08-31 DIAGNOSIS — N8500 Endometrial hyperplasia, unspecified: Secondary | ICD-10-CM | POA: Diagnosis not present

## 2018-08-31 DIAGNOSIS — D259 Leiomyoma of uterus, unspecified: Secondary | ICD-10-CM | POA: Diagnosis not present

## 2019-02-14 ENCOUNTER — Other Ambulatory Visit: Payer: Self-pay | Admitting: Obstetrics and Gynecology

## 2019-02-14 ENCOUNTER — Other Ambulatory Visit: Payer: Self-pay | Admitting: Endocrinology

## 2019-02-14 DIAGNOSIS — Z1231 Encounter for screening mammogram for malignant neoplasm of breast: Secondary | ICD-10-CM

## 2019-02-14 DIAGNOSIS — E1021 Type 1 diabetes mellitus with diabetic nephropathy: Secondary | ICD-10-CM

## 2019-02-14 NOTE — Telephone Encounter (Signed)
Please refill x 1 F/u is due  

## 2019-02-14 NOTE — Telephone Encounter (Signed)
LOV 12/16/17. Per your note, pt was advised to f/u in 2 months. No future appt noted. Please advise how you wish to proceed.

## 2019-03-20 ENCOUNTER — Encounter: Payer: Self-pay | Admitting: Endocrinology

## 2019-03-20 ENCOUNTER — Telehealth: Payer: Self-pay | Admitting: Endocrinology

## 2019-03-20 ENCOUNTER — Other Ambulatory Visit: Payer: Self-pay

## 2019-03-20 DIAGNOSIS — E1021 Type 1 diabetes mellitus with diabetic nephropathy: Secondary | ICD-10-CM

## 2019-03-20 MED ORDER — LEVOTHYROXINE SODIUM 112 MCG PO TABS: 112.0000 ug | ORAL_TABLET | Freq: Every day | ORAL | 0 refills | Status: DC

## 2019-03-20 NOTE — Telephone Encounter (Signed)
Patient refused to come in office for appointment. Patient is scheduled for phone call tomorrow at 3:30.

## 2019-03-20 NOTE — Telephone Encounter (Signed)
1.  Please schedule f/u appt 2.  Then please refill x 1, pending that appt.  

## 2019-03-20 NOTE — Telephone Encounter (Signed)
Per Dr. Ellison, unable to refill Levothyroxine without an appt. Routing this message to the front desk for scheduling purposes.  

## 2019-03-20 NOTE — Telephone Encounter (Signed)
FYI

## 2019-03-20 NOTE — Telephone Encounter (Signed)
levothyroxine (SYNTHROID) 112 MCG tablet 30 tablet 0 03/20/2019    Sig - Route: Take 1 tablet (112 mcg total) by mouth daily before breakfast. - Oral   Sent to pharmacy as: levothyroxine (SYNTHROID) 112 MCG tablet   E-Prescribing Status: Receipt confirmed by pharmacy (03/20/2019 3:27 PM EDT)

## 2019-03-20 NOTE — Telephone Encounter (Signed)
LOV 12/16/17. Per your office note, pt was advised to f/u 2 months. No future appt noted. Please advise.

## 2019-03-21 ENCOUNTER — Other Ambulatory Visit: Payer: Self-pay

## 2019-03-21 ENCOUNTER — Telehealth: Payer: Self-pay

## 2019-03-21 ENCOUNTER — Ambulatory Visit (INDEPENDENT_AMBULATORY_CARE_PROVIDER_SITE_OTHER): Payer: BC Managed Care – PPO | Admitting: Endocrinology

## 2019-03-21 DIAGNOSIS — E1021 Type 1 diabetes mellitus with diabetic nephropathy: Secondary | ICD-10-CM

## 2019-03-21 NOTE — Patient Instructions (Addendum)
blood tests are requested for you today.  We'll let you know about the results. please take these settings basal rate of 0.5 units/hr from 2 AM-2 PM, and 1.5 units/hr 2 PM-2 AM.   No mealtime bolus.   correction bolus (which some people call "sensitivity," or "insulin sensitivity ratio," or just "isr") of 1 unit whenever your blood sugar is over 300.  You can do as often as every hour if necessary.   If you are going to be active, suspend the pump for 1-2 hrs.  check your blood sugar 4 times a day: the 3 meals, and at bedtime.  also check if you have symptoms of your blood sugar being too high or too low.  please keep a record of the readings and bring it to your next appointment here (or you can bring the meter itself).  You can write it on any piece of paper.  please call us sooner if your blood sugar goes below 70, or if you have a lot of readings over 200.  Please call back to verify your current pump settings.  Please get set up with with a new primary care provider.   Please come back for a follow-up appointment in 2 months.

## 2019-03-21 NOTE — Telephone Encounter (Signed)
Pt requested a list of primary care providers so she can establish care with a new provider. List mailed to home address as requested.

## 2019-03-21 NOTE — Progress Notes (Signed)
Subjective:    Patient ID: Katherine Navarro, female    DOB: 1956/04/04, 63 y.o.   MRN: [id number]  HPI  telehealth visit today via phone x 13 minutes.   Alternatives to telehealth are presented to this patient, and the patient agrees to the telehealth visit. Pt is advised of the cost of the visit, and agrees to this, also.   Patient is at home, and I am at the office.   Persons attending the telehealth visit: the patient and I Pt returns for f/u of diabetes mellitus:   DM type: 1  Dx'ed: 1978.   Complications: polyneuropathy, retinopathy and nephropathy.   Therapy: insulin since dx GDM: never DKA: never Severe hypoglycemia: once (1988) Pancreatitis: never.  Other: on pump rx (now minimed paradigm) since approx 2005; therapy has been limited by noncompliance with cbg recording, and dosing via her pump; pump settings have been adjusted to emphasize basal rate; she sleeps from 4 AM-11 AM, and takes frequent naps during the day.   Interval history: pt believes she takes these settings, but she is not sure: basal rate of 0.5 units/hr from 2 AM-2 PM, and 1.5 units/hr 2 PM-2 AM.   No mealtime bolus.   correction bolus (which some people call "sensitivity," or "insulin sensitivity ratio," or just "isr") of 1 unit whenever your blood sugar is over 300.  You can do as often as every hour if necessary.   If you are going to be active, suspends the pump for 1-2 hrs.  She has mild hypoglycemia approx once per month.  There is no trend throughout the day.  pt states she feels well in general.   Past Medical History:  Diagnosis Date  . CONSTIPATION, CHRONIC 12/13/2007  . DIABETES MELLITUS, TYPE I 01/07/2007  . DM nephropathy/sclerosis   . GLAUCOMA 12/13/2007  . HYPERCHOLESTEROLEMIA 12/13/2007  . HYPERTENSION 01/07/2007  . Hyperthyroidism   . INTERNAL HEMORRHOIDS 07/23/2008  . LEUKOPENIA, CHRONIC 12/13/2007  . Nonproliferative diabetic retinopathy [id number] 12/13/2007  . Unspecified hypothyroidism  12/13/2007  . VARICOSE VEINS, LOWER EXTREMITIES 12/13/2007    Past Surgical History:  Procedure Laterality Date  . ELECTROCARDIOGRAM  08/24/2006  . EYE SURGERY     bilateral  . LEEP N/A 12/01/2012   Procedure: LOOP ELECTROSURGICAL EXCISION PROCEDURE (LEEP);  Surgeon: Delice Lesch, MD;  Location: [facility];  Service: Gynecology;  Laterality: N/A;  . Stress Myoview   05/18/2004  . TUBAL LIGATION      Social History   Socioeconomic History  . Marital status: Married    Spouse name: Not on file  . Number of children: Not on file  . Years of education: Not on file  . Highest education level: Not on file  Occupational History    Comment: Doesn't work outside the home  Social Needs  . Financial resource strain: Not on file  . Food insecurity    Worry: Not on file    Inability: Not on file  . Transportation needs    Medical: Not on file    Non-medical: Not on file  Tobacco Use  . Smoking status: Never Smoker  . Smokeless tobacco: Never Used  Substance and Sexual Activity  . Alcohol use: No  . Drug use: No  . Sexual activity: Not on file  Lifestyle  . Physical activity    Days per week: Not on file    Minutes per session: Not on file  . Stress: Not on file  Relationships  . Social connections  Talks on phone: Not on file    Gets together: Not on file    Attends religious service: Not on file    Active member of club or organization: Not on file    Attends meetings of clubs or organizations: Not on file    Relationship status: Not on file  . Intimate partner violence    Fear of current or ex partner: Not on file    Emotionally abused: Not on file    Physically abused: Not on file    Forced sexual activity: Not on file  Other Topics Concern  . Not on file  Social History Narrative   Pt gets regular exercise.          Current Outpatient Medications on File Prior to Visit  Medication Sig Dispense Refill  . alum & mag hydroxide-simeth (MYLANTA) 200-200-20 MG/5ML  suspension as needed.      Marland Kitchen amiloride-hydrochlorothiazide (MODURETIC) 5-50 MG tablet Take 0.5 tablets by mouth daily. 45 tablet 3  . Cod Liver Oil CAPS Take 1 capsule by mouth daily.      . colestipol (COLESTID) 5 g granules Take 5 g by mouth daily. 500 g 3  . dorzolamide-timolol (COSOPT) 22.3-6.8 MG/ML ophthalmic solution 1 drop 2 (two) times daily.    . fluticasone (FLONASE) 50 MCG/ACT nasal spray INSTILL 2 SPRAYS IN BOTH NOSTRILS DAILY 16 g 11  . Glucosamine 500 MG TABS Take 1 tablet by mouth daily.      . hydrocortisone-pramoxine (ANALPRAM-HC) 2.5-1 % rectal cream Place rectally as needed for hemorrhoids. Apply to rectum as needed 30 g 1  . ibuprofen (ADVIL,MOTRIN) 600 MG tablet Take 1 tablet (600 mg total) by mouth every 6 (six) hours as needed for pain. 30 tablet 0  . Insulin Infusion Pump Supplies (MINIMED INFUSION SET-MMT 396) MISC CHANGE EVERY 2 DAYS 15 each 0  . Insulin Infusion Pump Supplies (PARADIGM RESERVOIR 3ML) MISC CHANGE EVERY 2 DAYS 15 each 0  . insulin lispro (HUMALOG) 100 UNIT/ML injection Inject 0.5 mLs (50 Units total) into the skin See admin instructions. INJECT 50 UNITS UNDER THE SKIN DAILY VIA PUMP 15 mL 0  . levothyroxine (SYNTHROID) 112 MCG tablet Take 1 tablet (112 mcg total) by mouth daily before breakfast. 30 tablet 0  . magnesium oxide (MAG-OX) 400 MG tablet Take 2 tablets once daily     . nitrofurantoin (MACRODANTIN) 50 MG capsule Take 1 capsule (50 mg total) by mouth 3 (three) times daily. 21 capsule 0  . norethindrone (MICRONOR,CAMILA,ERRIN) 0.35 MG tablet Take 1 tablet by mouth daily.    Glory Rosebush ULTRA test strip USE AS INSTRUCTED 12 TIMES A DAY 300 each 0  . vitamin E 400 UNIT capsule Take 2 capsules by mouth twice a day as needed      No current facility-administered medications on file prior to visit.     Allergies  Allergen Reactions  . Atorvastatin     REACTION: perceived myalgias  . Ciprofloxacin     REACTION: pt states med  make her sick  .  Epinephrine     REACTION: thyroid problems  . Erythromycin   . Morphine   . Penicillins     REACTION: hives    Family History  Problem Relation Age of Onset  . Cancer Mother        Breast Cancer  . Diabetes Mother   . Breast cancer Mother   . Diabetes Father     There were no vitals taken for this  visit.  Review of Systems She denies hypoglycemia.      Objective:   Physical Exam      Assessment & Plan:  Type 1 DM: with DN: uncertain glycemic control and pump settings.  She declines A1c now.  Next step is to verify pump settings.   Patient Instructions  blood tests are requested for you today.  We'll let you know about the results. please take these settings basal rate of 0.5 units/hr from 2 AM-2 PM, and 1.5 units/hr 2 PM-2 AM.   No mealtime bolus.   correction bolus (which some people call "sensitivity," or "insulin sensitivity ratio," or just "isr") of 1 unit whenever your blood sugar is over 300.  You can do as often as every hour if necessary.   If you are going to be active, suspend the pump for 1-2 hrs.  check your blood sugar 4 times a day: the 3 meals, and at bedtime.  also check if you have symptoms of your blood sugar being too high or too low.  please keep a record of the readings and bring it to your next appointment here (or you can bring the meter itself).  You can write it on any piece of paper.  please call us sooner if your blood sugar goes below 70, or if you have a lot of readings over 200.  Please call back to verify your current pump settings.  Please get set up with with a new primary care provider.   Please come back for a follow-up appointment in 2 months.

## 2019-03-28 ENCOUNTER — Other Ambulatory Visit: Payer: Self-pay | Admitting: Endocrinology

## 2019-03-28 DIAGNOSIS — E1021 Type 1 diabetes mellitus with diabetic nephropathy: Secondary | ICD-10-CM

## 2019-03-28 NOTE — Telephone Encounter (Signed)
Please refill x 1 Further refills would have to be considered by new PCP  

## 2019-03-28 NOTE — Telephone Encounter (Signed)
Thyroid medication has already been refilled. Please advise about Moduretic

## 2019-04-02 DIAGNOSIS — Z8742 Personal history of other diseases of the female genital tract: Secondary | ICD-10-CM | POA: Diagnosis not present

## 2019-04-02 DIAGNOSIS — D259 Leiomyoma of uterus, unspecified: Secondary | ICD-10-CM | POA: Diagnosis not present

## 2019-04-02 DIAGNOSIS — R9389 Abnormal findings on diagnostic imaging of other specified body structures: Secondary | ICD-10-CM | POA: Diagnosis not present

## 2019-04-02 DIAGNOSIS — N8500 Endometrial hyperplasia, unspecified: Secondary | ICD-10-CM | POA: Diagnosis not present

## 2019-04-02 DIAGNOSIS — N939 Abnormal uterine and vaginal bleeding, unspecified: Secondary | ICD-10-CM | POA: Diagnosis not present

## 2019-04-15 ENCOUNTER — Other Ambulatory Visit: Payer: Self-pay | Admitting: Endocrinology

## 2019-04-15 DIAGNOSIS — E1021 Type 1 diabetes mellitus with diabetic nephropathy: Secondary | ICD-10-CM

## 2019-04-15 NOTE — Telephone Encounter (Signed)
1.  Please schedule f/u appt 2.  Then please refill x 1, pending that appt.  

## 2019-04-16 NOTE — Telephone Encounter (Signed)
Per Dr. Loanne Drilling, unable to refill Euthyrox without an appt. Routing this message to the front desk for scheduling purposes.

## 2019-04-17 NOTE — Telephone Encounter (Signed)
LMTCB to schedule appointment °

## 2019-04-23 ENCOUNTER — Other Ambulatory Visit: Payer: Self-pay

## 2019-04-23 ENCOUNTER — Ambulatory Visit
Admission: RE | Admit: 2019-04-23 | Discharge: 2019-04-23 | Disposition: A | Discharge status: Home / Self Care | Payer: BC Managed Care – PPO | Source: Ambulatory Visit | Attending: Obstetrics and Gynecology | Admitting: Obstetrics and Gynecology

## 2019-04-23 DIAGNOSIS — Z1231 Encounter for screening mammogram for malignant neoplasm of breast: Secondary | ICD-10-CM | POA: Diagnosis not present

## 2019-04-23 IMAGING — MG DIGITAL SCREENING BILAT W/ TOMO W/ CAD
6 of 10 series · 6 of 30 positions shown · non-contrast
Comparison: Previous exam(s).

CLINICAL DATA: Screening.

EXAM:
DIGITAL SCREENING BILATERAL MAMMOGRAM WITH TOMO AND CAD

[L MLO synth-2D]
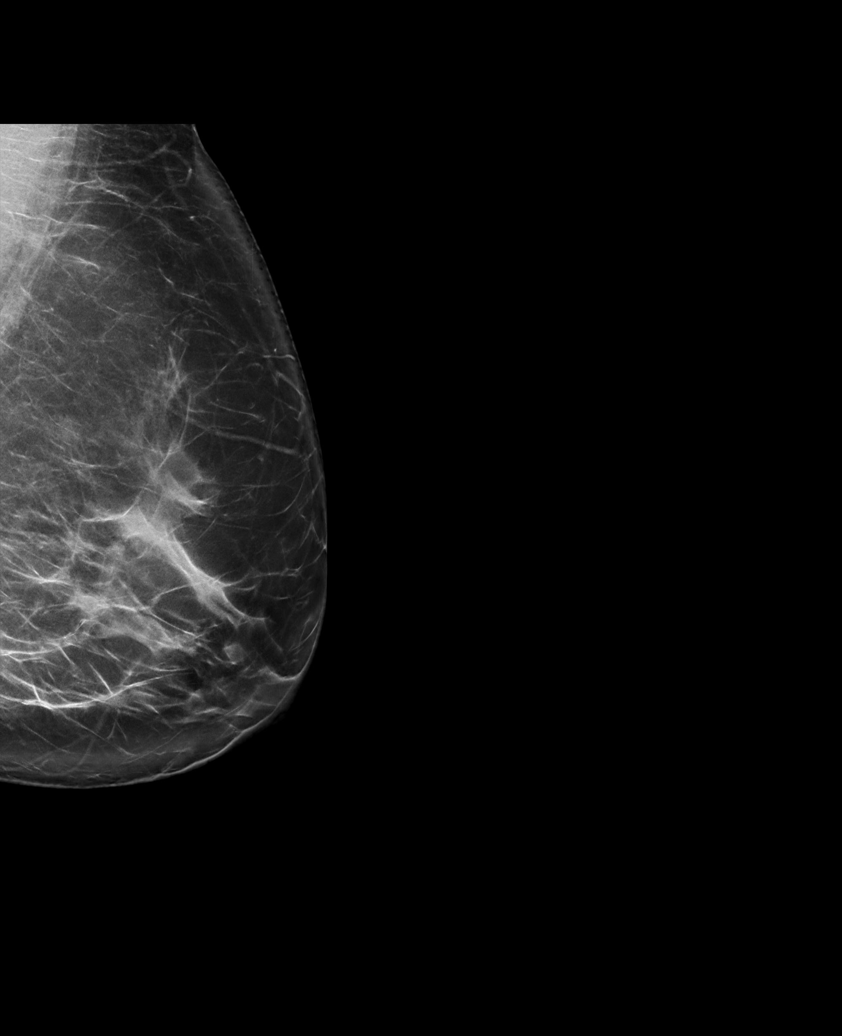

[L CC synth-2D]
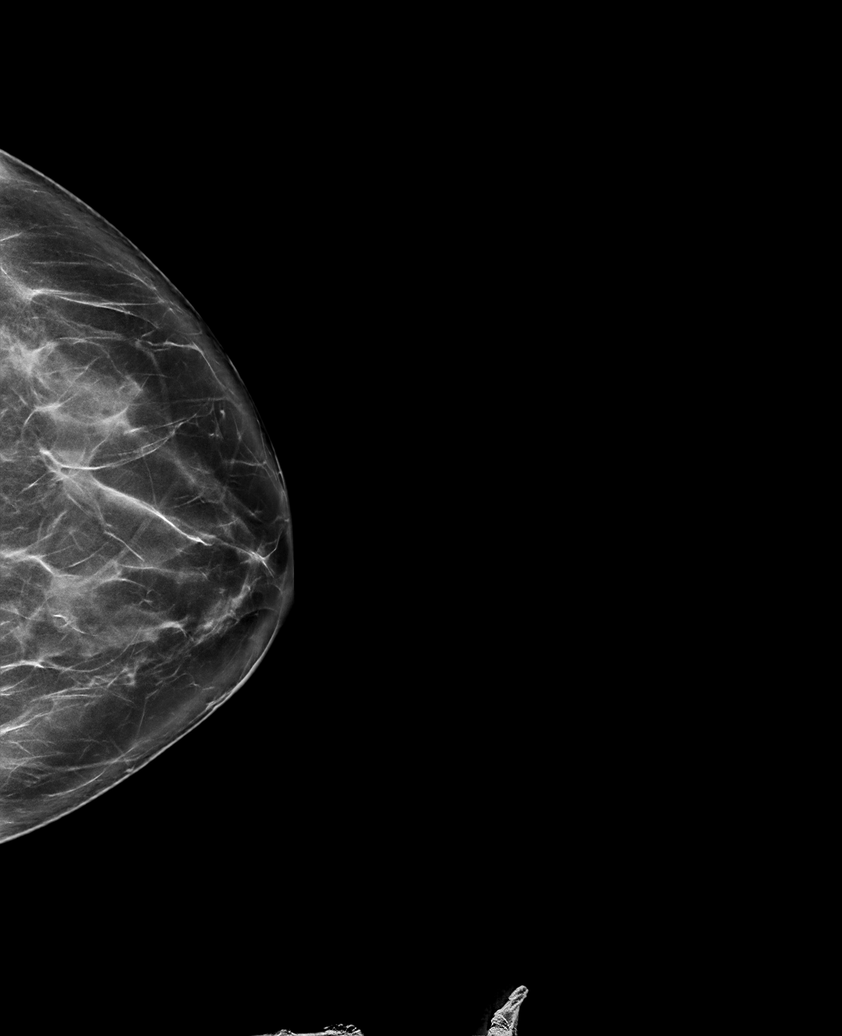

[R CC synth-2D (1 of 2)]
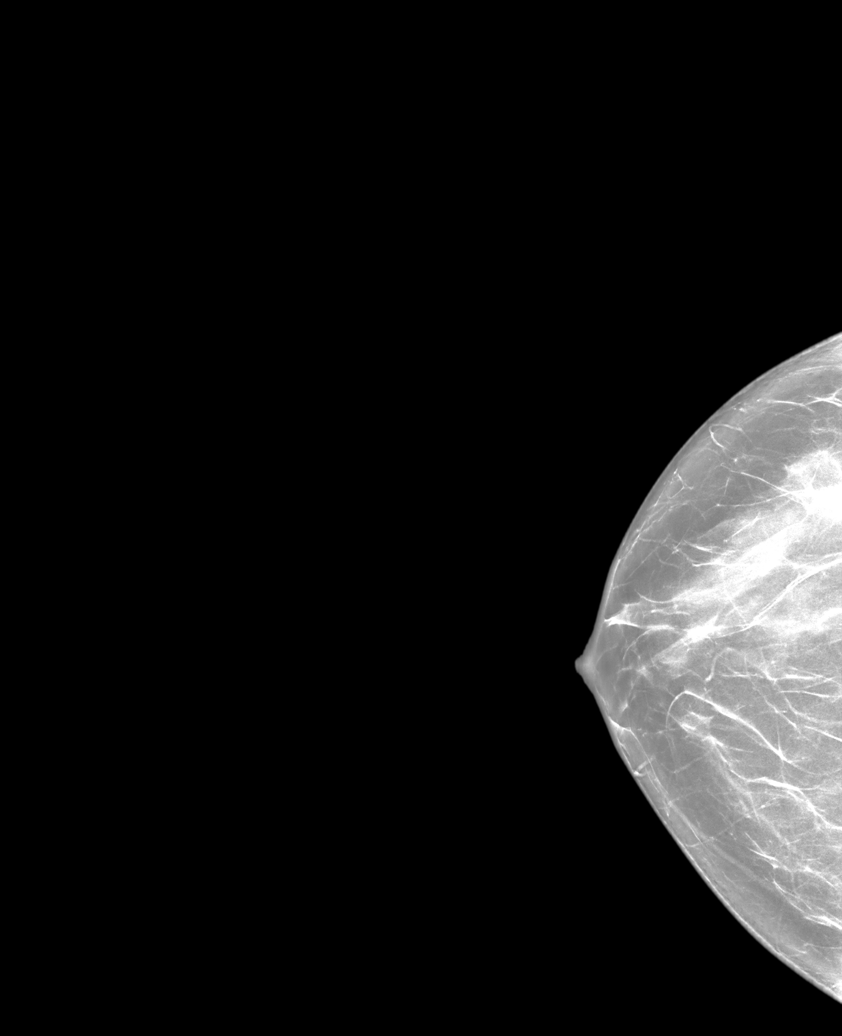

[R CC synth-2D (2 of 2)]
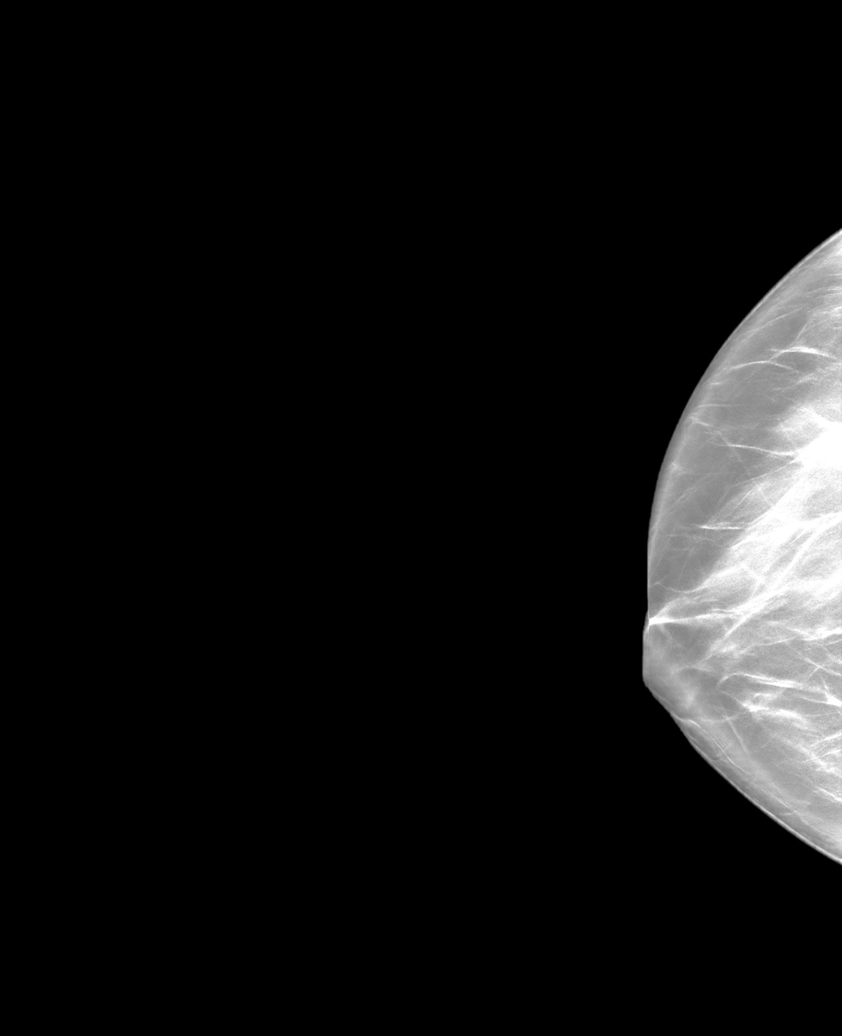

[R MLO synth-2D]
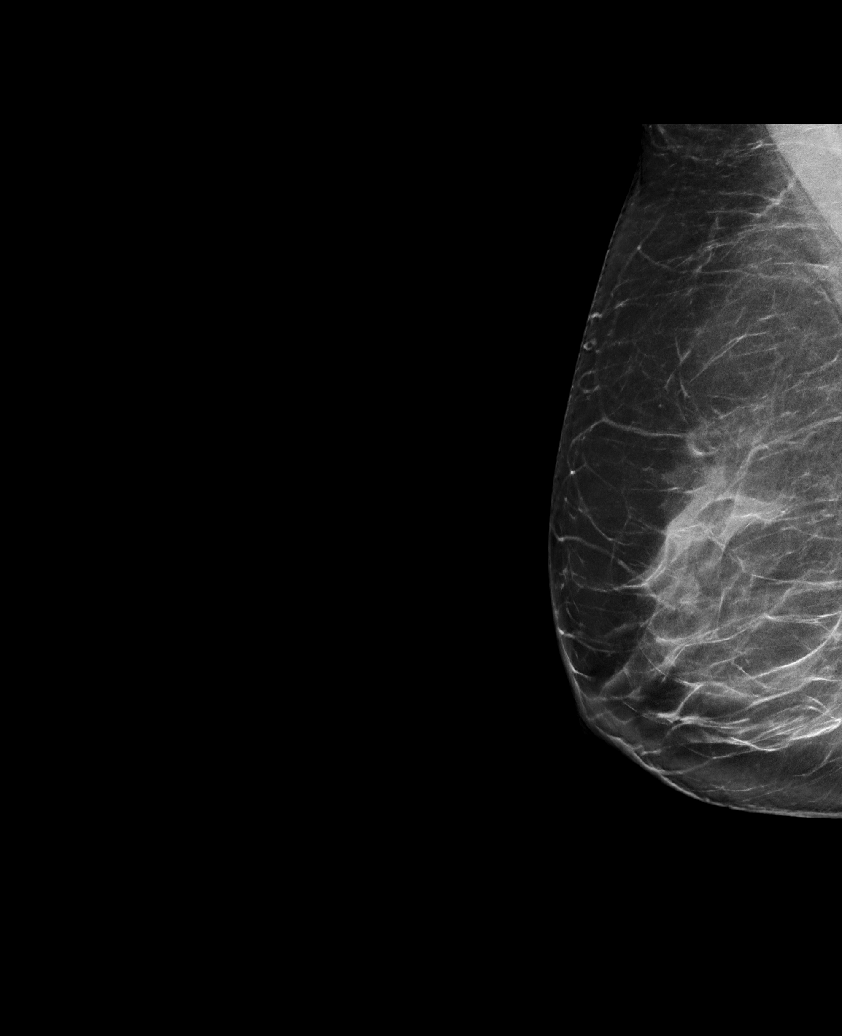

[L MLO tomo · tomo slice 45/90.0]
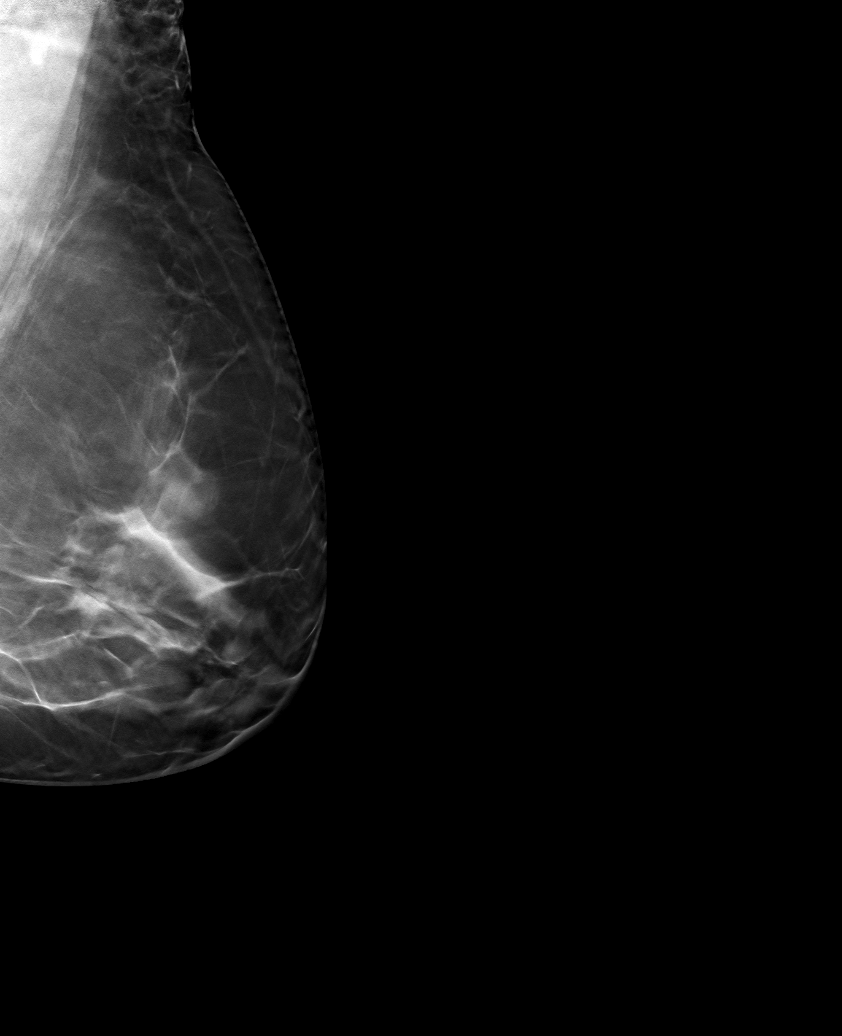

[6 of 30 positions shown; findings below may reference images not displayed]

ACR Breast Density Category c: The breast tissue is heterogeneously
dense, which may obscure small masses.
FINDINGS: There are no findings suspicious for malignancy. Images were
processed with CAD.
IMPRESSION: No mammographic evidence of malignancy. A result letter of this
screening mammogram will be mailed directly to the patient.

RECOMMENDATION:
Screening mammogram in one year. (Code:[5V])

BI-RADS CATEGORY  1: Negative.

## 2019-04-26 NOTE — Telephone Encounter (Signed)
LMTCB to schedule Follow Up x 2

## 2019-05-02 ENCOUNTER — Encounter: Payer: Self-pay | Admitting: Endocrinology

## 2019-05-02 NOTE — Telephone Encounter (Signed)
LMTCB x 3 to schedule appointment

## 2019-05-02 NOTE — Telephone Encounter (Signed)
Mailed letter °

## 2019-05-14 ENCOUNTER — Other Ambulatory Visit: Payer: Self-pay | Admitting: Obstetrics and Gynecology

## 2019-05-14 DIAGNOSIS — Z8742 Personal history of other diseases of the female genital tract: Secondary | ICD-10-CM | POA: Diagnosis not present

## 2019-05-14 DIAGNOSIS — N939 Abnormal uterine and vaginal bleeding, unspecified: Secondary | ICD-10-CM | POA: Diagnosis not present

## 2019-08-02 ENCOUNTER — Ambulatory Visit (HOSPITAL_COMMUNITY)
Admission: EM | Admit: 2019-08-02 | Discharge: 2019-08-02 | Disposition: A | Discharge status: Home / Self Care | Payer: BC Managed Care – PPO | Attending: Family Medicine | Admitting: Family Medicine

## 2019-08-02 ENCOUNTER — Other Ambulatory Visit: Payer: Self-pay

## 2019-08-02 ENCOUNTER — Encounter (HOSPITAL_COMMUNITY): Payer: Self-pay | Admitting: Emergency Medicine

## 2019-08-02 ENCOUNTER — Ambulatory Visit (INDEPENDENT_AMBULATORY_CARE_PROVIDER_SITE_OTHER): Payer: BC Managed Care – PPO

## 2019-08-02 DIAGNOSIS — I10 Essential (primary) hypertension: Secondary | ICD-10-CM | POA: Insufficient documentation

## 2019-08-02 DIAGNOSIS — Z885 Allergy status to narcotic agent status: Secondary | ICD-10-CM | POA: Diagnosis not present

## 2019-08-02 DIAGNOSIS — E039 Hypothyroidism, unspecified: Secondary | ICD-10-CM | POA: Diagnosis not present

## 2019-08-02 DIAGNOSIS — E1021 Type 1 diabetes mellitus with diabetic nephropathy: Secondary | ICD-10-CM | POA: Diagnosis not present

## 2019-08-02 DIAGNOSIS — Z881 Allergy status to other antibiotic agents status: Secondary | ICD-10-CM | POA: Diagnosis not present

## 2019-08-02 DIAGNOSIS — R531 Weakness: Secondary | ICD-10-CM | POA: Insufficient documentation

## 2019-08-02 DIAGNOSIS — K5909 Other constipation: Secondary | ICD-10-CM | POA: Insufficient documentation

## 2019-08-02 DIAGNOSIS — R109 Unspecified abdominal pain: Secondary | ICD-10-CM | POA: Diagnosis not present

## 2019-08-02 DIAGNOSIS — R1084 Generalized abdominal pain: Secondary | ICD-10-CM

## 2019-08-02 DIAGNOSIS — Z833 Family history of diabetes mellitus: Secondary | ICD-10-CM | POA: Insufficient documentation

## 2019-08-02 DIAGNOSIS — Z794 Long term (current) use of insulin: Secondary | ICD-10-CM | POA: Insufficient documentation

## 2019-08-02 DIAGNOSIS — Z79890 Hormone replacement therapy (postmenopausal): Secondary | ICD-10-CM | POA: Insufficient documentation

## 2019-08-02 DIAGNOSIS — Z880 Allergy status to penicillin: Secondary | ICD-10-CM | POA: Insufficient documentation

## 2019-08-02 DIAGNOSIS — Z79899 Other long term (current) drug therapy: Secondary | ICD-10-CM | POA: Insufficient documentation

## 2019-08-02 LAB — HEMOGLOBIN A1C
Hgb A1c MFr Bld: 11.9 % — ABNORMAL HIGH (ref 4.8–5.6)
Mean Plasma Glucose: 294.83 mg/dL

## 2019-08-02 LAB — COMPREHENSIVE METABOLIC PANEL
ALT: 29 U/L (ref 0–44)
AST: 39 U/L (ref 15–41)
Albumin: 4.6 g/dL (ref 3.5–5.0)
Alkaline Phosphatase: 72 U/L (ref 38–126)
Anion gap: 13 (ref 5–15)
BUN: 15 mg/dL (ref 8–23)
CO2: 29 mmol/L (ref 22–32)
Calcium: 12 mg/dL — ABNORMAL HIGH (ref 8.9–10.3)
Chloride: 96 mmol/L — ABNORMAL LOW (ref 98–111)
Creatinine, Ser: 1.36 mg/dL — ABNORMAL HIGH (ref 0.44–1.00)
GFR calc Af Amer: 49 mL/min — ABNORMAL LOW (ref >60)
GFR calc non Af Amer: 42 mL/min — ABNORMAL LOW (ref >60)
Glucose, Bld: 160 mg/dL — ABNORMAL HIGH (ref 70–99)
Potassium: 4.2 mmol/L (ref 3.5–5.1)
Sodium: 138 mmol/L (ref 135–145)
Total Bilirubin: 0.9 mg/dL (ref 0.3–1.2)
Total Protein: 8.1 g/dL (ref 6.5–8.1)

## 2019-08-02 LAB — TSH: TSH: 77.065 u[IU]/mL — ABNORMAL HIGH (ref 0.350–4.500)

## 2019-08-02 IMAGING — DX DG ABDOMEN 2V
4 series · 4 of 4 positions shown · non-contrast
Comparison: None.

CLINICAL DATA: Diffuse abdominal pain

EXAM:
ABDOMEN - 2 VIEW

[abdomen erect (1 of 2)]
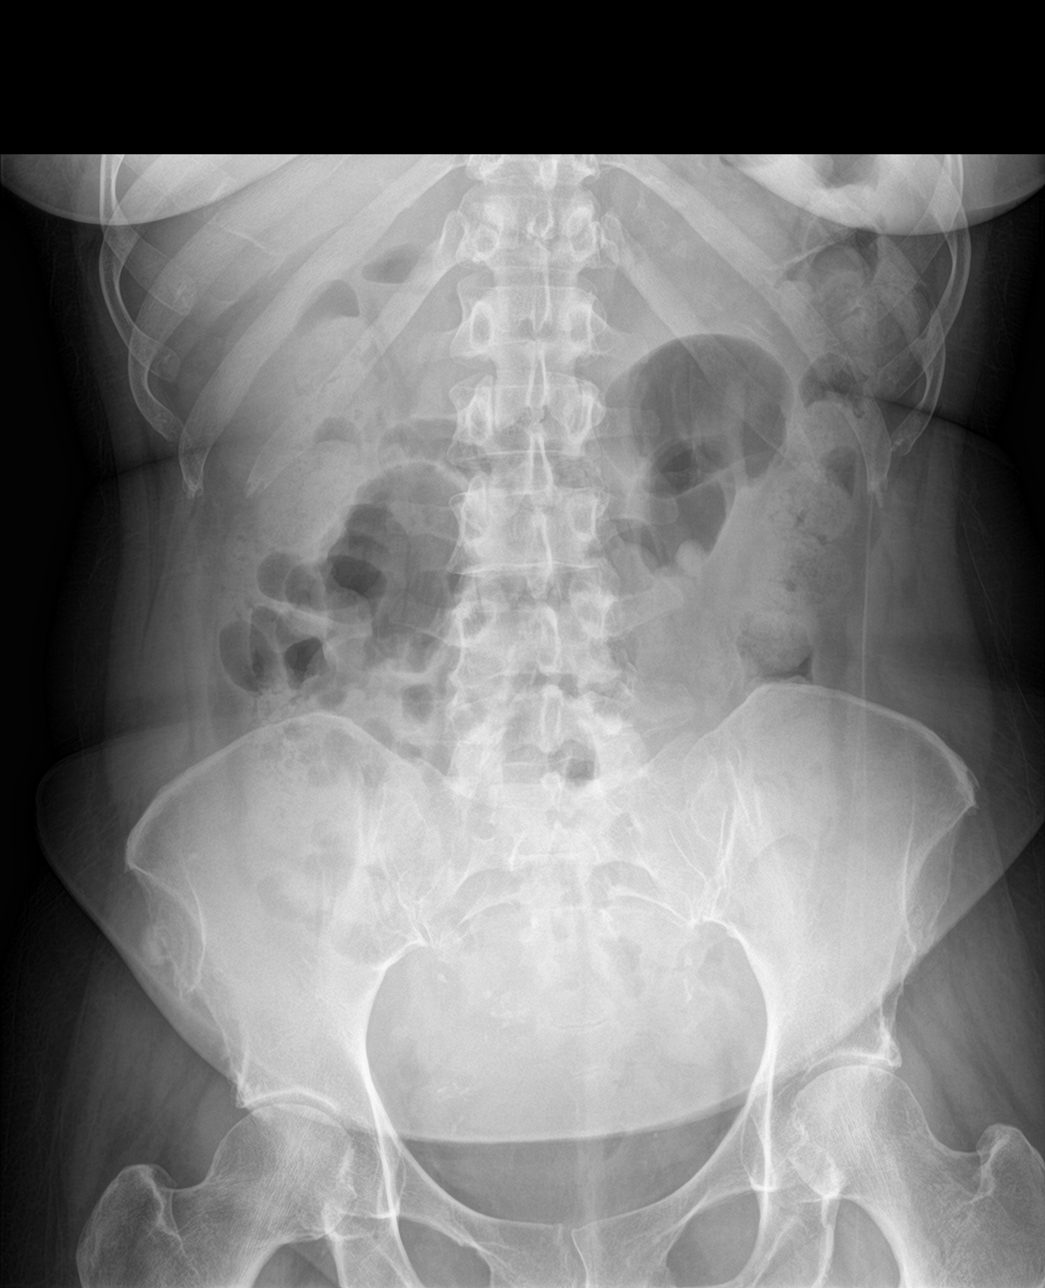

[abdomen supine (1 of 2)]
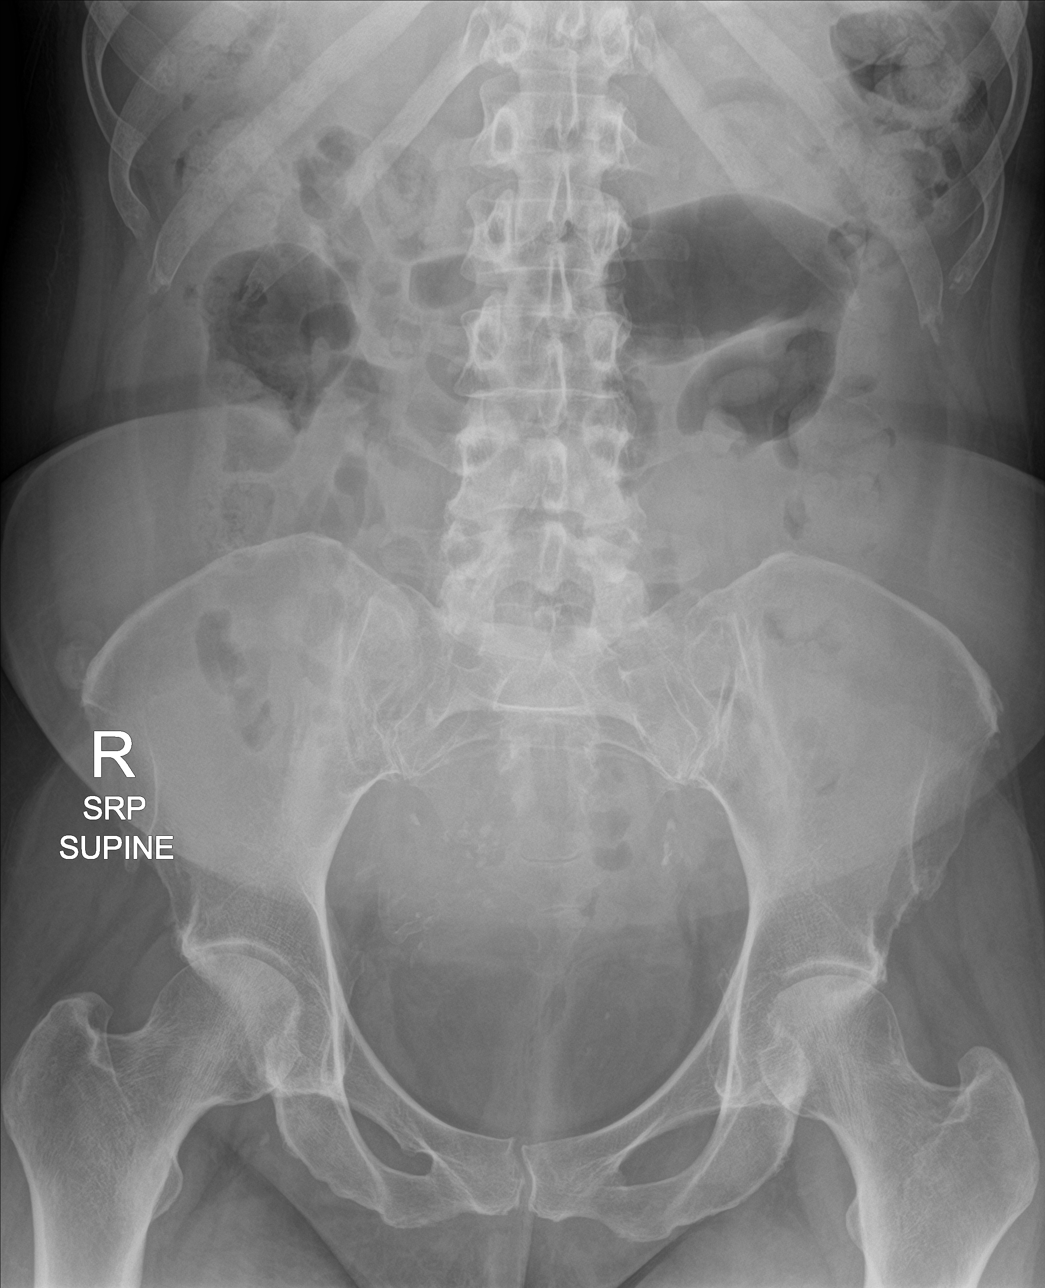

[abdomen supine (2 of 2)]
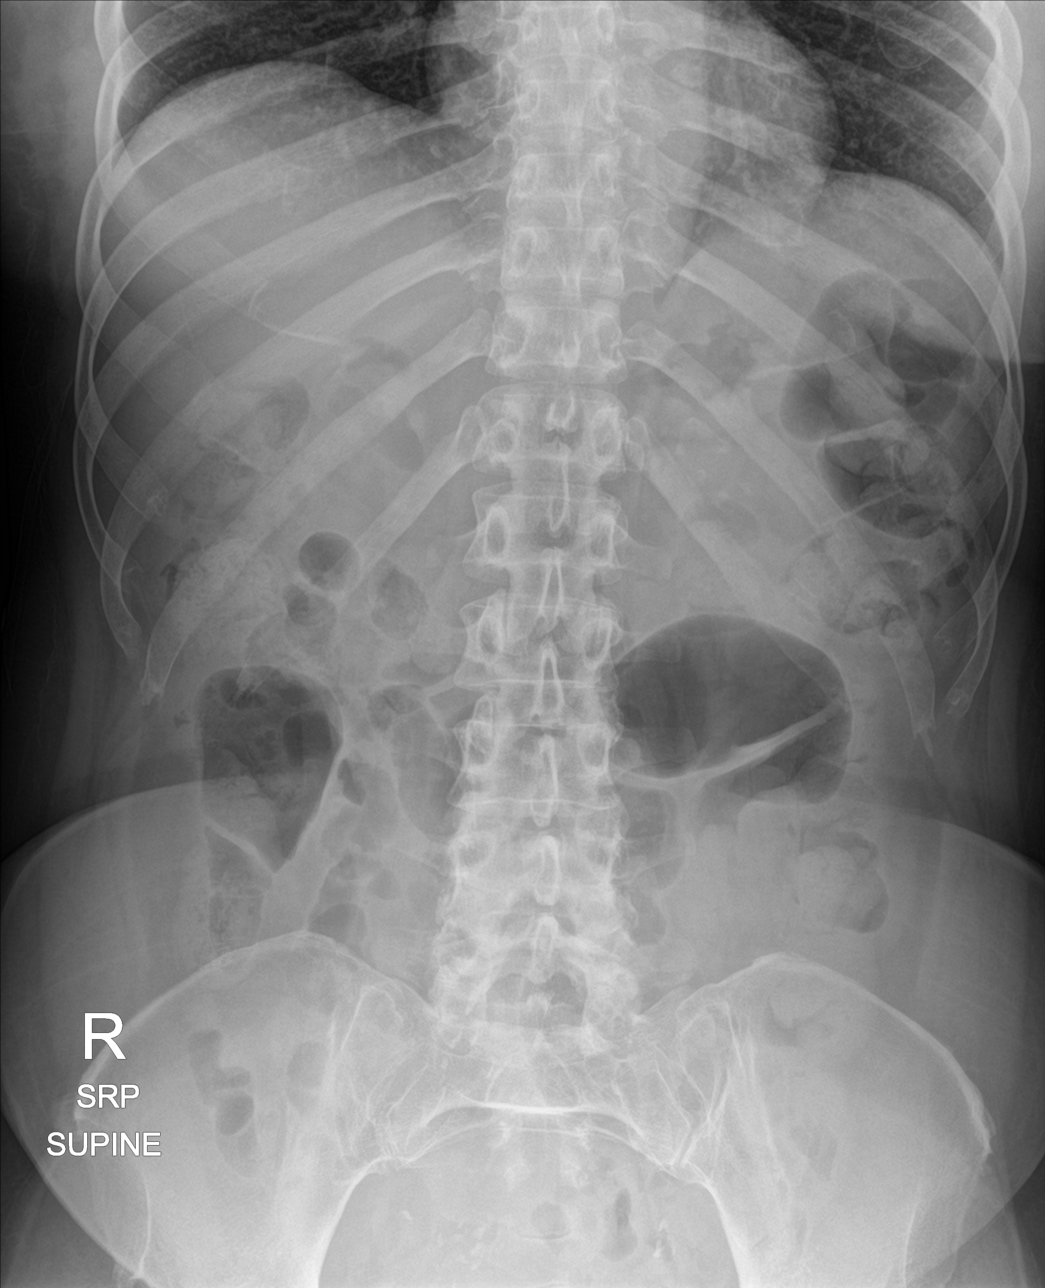

[abdomen erect (2 of 2)]
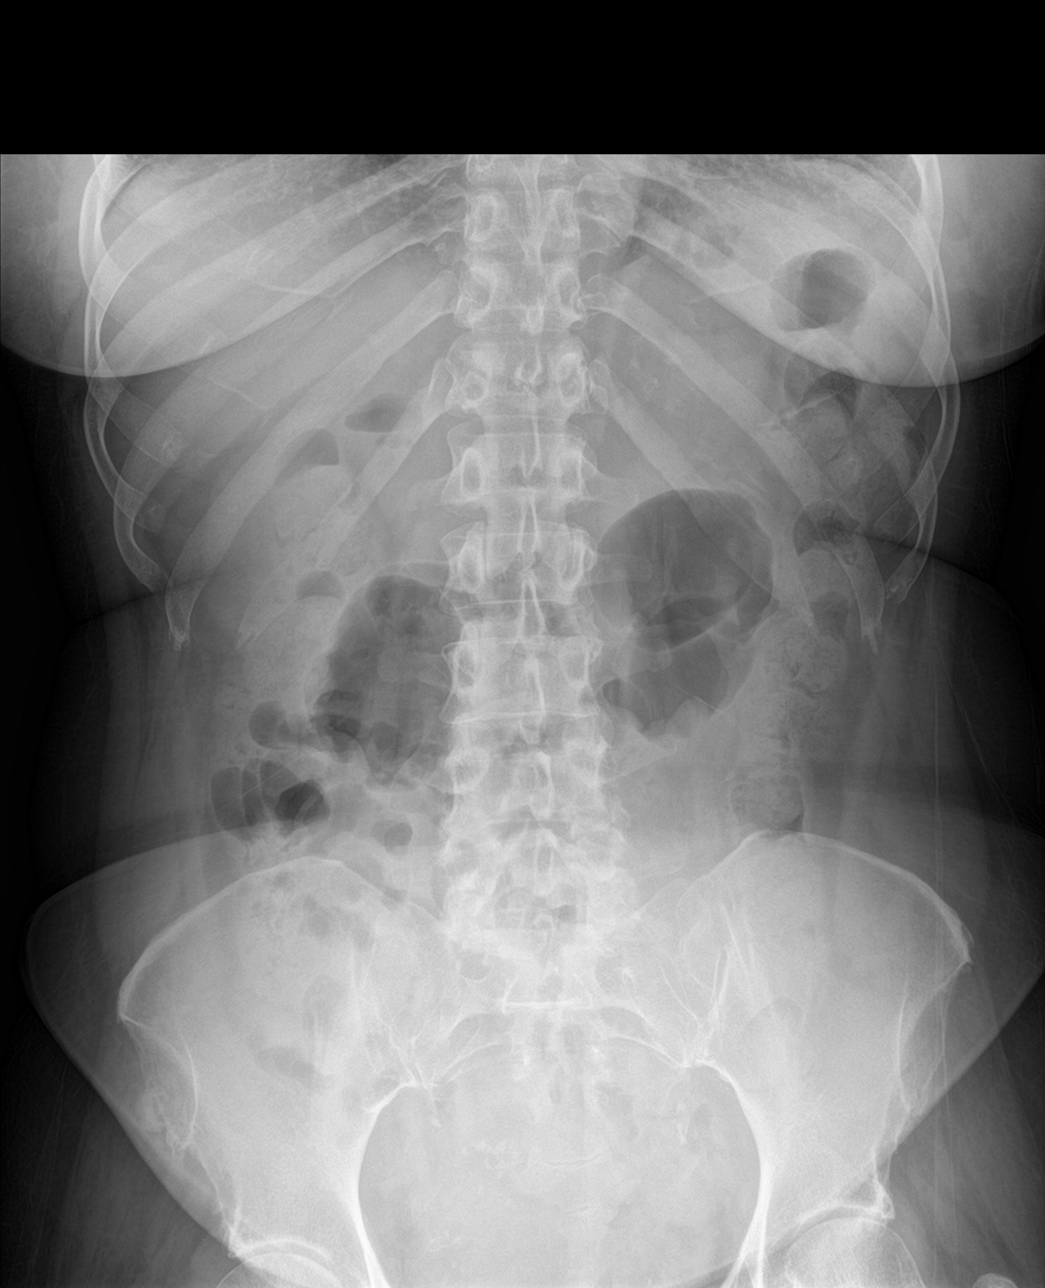

[4 of 4 positions shown; findings below may reference images not displayed]

FINDINGS: Scattered large and small bowel gas is noted. Mild retained fecal
material is seen without obstructive change. No free air is noted.
No abnormal mass or abnormal calcifications are seen. No bony
abnormality is noted.
IMPRESSION: Mild retained fecal material.  No obstructive changes are seen.

## 2019-08-02 MED ORDER — ONDANSETRON 4 MG PO TBDP: 4.0000 mg | ORAL_TABLET | Freq: Three times a day (TID) | ORAL | 0 refills | Status: DC | PRN

## 2019-08-02 MED ORDER — ALUM & MAG HYDROXIDE-SIMETH 400-400-40 MG/5ML PO SUSP: 10.0000 mL | Freq: Four times a day (QID) | ORAL | 0 refills | Status: DC | PRN

## 2019-08-02 NOTE — ED Provider Notes (Addendum)
[place]    CSN: [id number] Arrival date & time: 08/02/19  1223      History   Chief Complaint Chief Complaint  Patient presents with  . Abdominal Pain    HPI Katherine Navarro is a 64 y.o. female.   Patient is a 64 year old female has medical history of constipation, diabetes type 1, glaucoma, hypercholesterolemia, hypertension, hypothyroidism, retinopathy. She presents today with approximately 1 year of waxing waning abdominal pain.  Reports symptoms worsened over the past few weeks.  She suffers from chronic constipation. She has had increased  gas.   She has been taking Epson salt but she reports this "cleans her out" and then starts to feel nauseous and has vomiting afterwards.  This is been an ongoing thing for her for a while.  No fever, diarrhea.  She has had generalized increased weakness and loss of appetite.  She reports that she sleeps 24 hours at a time.  She has frequently had asked her husband to come home from work to help her because she is not feeling well.  This is becoming a burden on him and he is missing too much work.  No cough, chest congestion, chest pain or shortness of breath.  No dizziness, vision changes, headache.  She has not been taking any of her medications besides her insulin pump.  ROS per HPI      Past Medical History:  Diagnosis Date  . CONSTIPATION, CHRONIC 12/13/2007  . DIABETES MELLITUS, TYPE I 01/07/2007  . DM nephropathy/sclerosis   . GLAUCOMA 12/13/2007  . HYPERCHOLESTEROLEMIA 12/13/2007  . HYPERTENSION 01/07/2007  . Hyperthyroidism   . INTERNAL HEMORRHOIDS 07/23/2008  . LEUKOPENIA, CHRONIC 12/13/2007  . Nonproliferative diabetic retinopathy [id number]) 12/13/2007  . Unspecified hypothyroidism 12/13/2007  . VARICOSE VEINS, LOWER EXTREMITIES 12/13/2007    Patient Active Problem List   Diagnosis Date Noted  . Screening examination for infectious disease 07/15/2015  . Numbness 11/19/2014  . Wellness examination 05/22/2014  .  Arthralgia 05/14/2013  . Pyuria 05/14/2013  . Abnormal EKG 05/01/2012  . Encounter for long-term (current) use of other medications 05/18/2011  . Disturbance of skin sensation 11/13/2010  . CRAMP OF LIMB 01/05/2010  . INTERNAL HEMORRHOIDS 07/23/2008  . CONSTIPATION 07/23/2008  . RECTAL BLEEDING 07/23/2008  . Hypothyroidism 12/13/2007  . HYPERCHOLESTEROLEMIA 12/13/2007  . LEUKOPENIA, CHRONIC 12/13/2007  . NONPROLIFERATIVE DIABETIC RETINOPATHY NOS 12/13/2007  . GLAUCOMA 12/13/2007  . VARICOSE VEINS, LOWER EXTREMITIES 12/13/2007  . CONSTIPATION, CHRONIC 12/13/2007  . ACUTE CYSTITIS 12/13/2007  . Type 1 diabetes mellitus ([place]) 01/07/2007  . HYPERTENSION 01/07/2007    Past Surgical History:  Procedure Laterality Date  . ELECTROCARDIOGRAM  08/24/2006  . EYE SURGERY     bilateral  . LEEP N/A 12/01/2012   Procedure: LOOP ELECTROSURGICAL EXCISION PROCEDURE (LEEP);  Surgeon: Delice Lesch, MD;  Location: [facility];  Service: Gynecology;  Laterality: N/A;  . Stress Myoview   05/18/2004  . TUBAL LIGATION      OB History   No obstetric history on file.      Home Medications    Prior to Admission medications   Medication Sig Start Date End Date Taking? Authorizing Provider  dorzolamide-timolol (COSOPT) 22.3-6.8 MG/ML ophthalmic solution 1 drop 2 (two) times daily.   Yes [provider]  Insulin Infusion Pump Supplies (MINIMED INFUSION SET-MMT 396) MISC CHANGE EVERY 2 DAYS 02/14/19  Yes Renato Shin, MD  Insulin Infusion Pump Supplies (PARADIGM RESERVOIR 3ML) MISC CHANGE EVERY 2 DAYS 02/14/19  Yes  Renato Shin, MD  insulin lispro (HUMALOG) 100 UNIT/ML injection Inject 0.5 mLs (50 Units total) into the skin See admin instructions. INJECT 50 UNITS UNDER THE SKIN DAILY VIA PUMP 02/14/19  Yes Renato Shin, MD  alum & mag hydroxide-simeth (MAALOX PLUS) 400-400-40 MG/5ML suspension Take 10 mLs by mouth every 6 (six) hours as needed for indigestion. 08/02/19   Loura Halt A, NP    amiloride-hydrochlorothiazide (MODURETIC) 5-50 MG tablet Take 0.5 tablets by mouth daily. SEND FUTURE REFILLS TO NEW PCP 03/28/19   Renato Shin, MD  [facility] Liver Oil CAPS Take 1 capsule by mouth daily.      [provider]  colestipol (COLESTID) 5 g granules Take 5 g by mouth daily. 09/08/16   Renato Shin, MD  fluticasone Asencion Islam) 50 MCG/ACT nasal spray INSTILL 2 SPRAYS IN BOTH NOSTRILS DAILY 09/02/17   Renato Shin, MD  Glucosamine 500 MG TABS Take 1 tablet by mouth daily.      [provider]  hydrocortisone-pramoxine [facility]) 2.5-1 % rectal cream Place rectally as needed for hemorrhoids. Apply to rectum as needed 05/31/11   Renato Shin, MD  ibuprofen (ADVIL,MOTRIN) 600 MG tablet Take 1 tablet (600 mg total) by mouth every 6 (six) hours as needed for pain. 12/01/12   Everett Graff, MD  levothyroxine (SYNTHROID) 112 MCG tablet Take 1 tablet (112 mcg total) by mouth daily before breakfast. 03/20/19   Renato Shin, MD  magnesium oxide (MAG-OX) 400 MG tablet Take 2 tablets once daily     [provider]  norethindrone (MICRONOR,CAMILA,ERRIN) 0.35 MG tablet Take 1 tablet by mouth daily.    [provider]  ondansetron (ZOFRAN ODT) 4 MG disintegrating tablet Take 1 tablet (4 mg total) by mouth every 8 (eight) hours as needed for nausea or vomiting. 08/02/19   Orvan July, NP  ONETOUCH ULTRA test strip USE AS INSTRUCTED 12 TIMES A DAY 02/14/19   Renato Shin, MD  vitamin E 400 UNIT capsule Take 2 capsules by mouth twice a day as needed     [provider]    Family History Family History  Problem Relation Age of Onset  . Cancer Mother        Breast Cancer  . Diabetes Mother   . Breast cancer Mother   . Diabetes Father     Social History Social History   Tobacco Use  . Smoking status: Never Smoker  . Smokeless tobacco: Never Used  Substance Use Topics  . Alcohol use: No  . Drug use: No     Allergies   Atorvastatin, Ciprofloxacin,  Epinephrine, Erythromycin, Morphine, and Penicillins   Review of Systems Review of Systems   Physical Exam Triage Vital Signs ED Triage Vitals [08/02/19 1343]  Enc Vitals Group     BP (!) 144/80     Pulse Rate 71     Resp 18     Temp 98.7 F (37.1 C)     Temp Source Oral     SpO2 97 %     Weight      Height      Head Circumference      Peak Flow      Pain Score      Pain Loc      Pain Edu?      Excl. in [place]?    No data found.  Updated Vital Signs BP (!) 144/80 (BP Location: Right Arm)   Pulse 71   Temp 98.7 F (37.1 C) (Oral)  Resp 18   LMP 05/03/2013   SpO2 97%   Visual Acuity Right Eye Distance:   Left Eye Distance:   Bilateral Distance:    Right Eye Near:   Left Eye Near:    Bilateral Near:     Physical Exam Vitals and nursing note reviewed.  Constitutional:      General: She is not in acute distress.    Appearance: She is well-developed. She is not ill-appearing, toxic-appearing or diaphoretic.  HENT:     Head: Normocephalic and atraumatic.  Eyes:     Comments: Exophthalmus, left eye tearing.    Cardiovascular:     Rate and Rhythm: Normal rate and regular rhythm.  Pulmonary:     Effort: Pulmonary effort is normal.     Breath sounds: Normal breath sounds.  Abdominal:     General: Abdomen is flat. Bowel sounds are normal.     Palpations: Abdomen is soft.     Tenderness: There is generalized abdominal tenderness.     Hernia: No hernia is present.     Comments: Insulin pump in place  Skin:    General: Skin is warm and dry.  Neurological:     Mental Status: She is alert.     Cranial Nerves: No dysarthria or facial asymmetry.     Motor: Weakness present.     Gait: Gait abnormal.  Psychiatric:        Mood and Affect: Affect is flat.        Speech: Speech is delayed.        Behavior: Behavior is slowed.      UC Treatments / Results  Labs (all labs ordered are listed, but only abnormal results are displayed) Labs Reviewed    COMPREHENSIVE METABOLIC PANEL - Abnormal; Notable for the following components:      Result Value   Chloride 96 (*)    Glucose, Bld 160 (*)    Creatinine, Ser 1.36 (*)    Calcium 12.0 (*)    GFR calc non Af Amer 42 (*)    GFR calc Af Amer 49 (*)    All other components within normal limits  TSH - Abnormal; Notable for the following components:   TSH 77.065 (*)    All other components within normal limits  HEMOGLOBIN A1C - Abnormal; Notable for the following components:   Hgb A1c MFr Bld 11.9 (*)    All other components within normal limits  CBC WITH DIFFERENTIAL/PLATELET    EKG   Radiology DG Abd 2 Views  Result Date: 08/02/2019 CLINICAL DATA:  Diffuse abdominal pain EXAM: ABDOMEN - 2 VIEW COMPARISON:  None. FINDINGS: Scattered large and small bowel gas is noted. Mild retained fecal material is seen without obstructive change. No free air is noted. No abnormal mass or abnormal calcifications are seen. No bony abnormality is noted. IMPRESSION: Mild retained fecal material.  No obstructive changes are seen. Electronically Signed   By: Inez Catalina M.D.   On: 08/02/2019 14:53    Procedures Procedures (including critical care time)  Medications Ordered in UC Medications - No data to display  Initial Impression / Assessment and Plan / UC Course  I have reviewed the triage vital signs and the nursing notes.  Pertinent labs & imaging results that were available during my care of the patient were reviewed by me and considered in my medical decision making (see chart for details).     64 year old female presents today for generalized abdominal discomfort, constipation, increased gas.  This has been an ongoing issue for for "a while".  Has been using Epson salt for constipation with some relief but has had constant nausea, loss of appetite and extreme fatigue.  She has been sleeping at the 24 hours at a time.  Reporting that her husband has had to miss work to take care of her.  Requesting work note for him.  Patient here with unsteady gait and needing help with ADLs.  Patient very slowed speech and slow to respond to questions and seems very confused at times. Reporting prior to about a year ago she was able to walk daily and do chores around the house but since has declined. Abdominal x-ray today did not show anything concerning. Lab work with elevated TSH but patient has not been taking her thyroid medication. She has not been taking any of her medications.  Hemoglobin A1c is at 11.9 which is her baseline EKG normal sinus rhythm  She was unable to provide a urine specimen  Nothing appears to be acute here today. My concern is her inability to take care of herself, decline in mental status.  She appears to be alert and oriented here but her affect is very flat and she is very slow speech and slow to respond to questions. Appears sedated.  I am not seeing anywhere in her medical history that would correlate with this nor do I see that she takes any sedating medications. Recommend she follow-up with her primary care for further evaluation and possible MRI of the brain. Pt husband with her and they are understanding and agreed.  Zofran and Maalox as needed  Final Clinical Impressions(s) / UC Diagnoses   Final diagnoses:  Generalized abdominal pain  Weakness     Discharge Instructions     Your x ray did not show anything concerning Your Hgb A1C was 11.9 I am still waiting on the rest of your blood work. I will call you with any concerning results.  Your EKG was not concerning.  You need to see your primary care doctor and I believe that you need an MRI of the brain due to your increased weakness, loss of appetite, nausea and inability to take care of yourself anymore. This is concerning.  I am going to send a message to your doctor.        ED Prescriptions    Medication Sig Dispense Auth. Provider   ondansetron (ZOFRAN ODT) 4 MG disintegrating tablet  Take 1 tablet (4 mg total) by mouth every 8 (eight) hours as needed for nausea or vomiting. 20 tablet Orit Sanville A, NP   alum & mag hydroxide-simeth (MAALOX PLUS) 400-400-40 MG/5ML suspension Take 10 mLs by mouth every 6 (six) hours as needed for indigestion. 355 mL Ipek Westra A, NP     PDMP not reviewed this encounter.   Orvan July, NP 08/03/19 1022    Loura Halt A, NP 08/03/19 1028

## 2019-08-02 NOTE — Discharge Instructions (Addendum)
Your x ray did not show anything concerning Your Hgb A1C was 11.9 I am still waiting on the rest of your blood work. I will call you with any concerning results.  Your EKG was not concerning.  You need to see your primary care doctor and I believe that you need an MRI of the brain due to your increased weakness, loss of appetite, nausea and inability to take care of yourself anymore. This is concerning.  I am going to send a message to your doctor.

## 2019-08-02 NOTE — ED Triage Notes (Addendum)
Abdominal pain started Tuesday.  No diarrhea.  Reports she was constipated.  Patient took a laxative and did feel somewhat better.  Then started having nausea and vomiting.    Difficult storyteller.  Patient has chronic issues that are mentioned frequently  Reports a lot of changes in health issues during "2020"

## 2019-08-14 DIAGNOSIS — R7989 Other specified abnormal findings of blood chemistry: Secondary | ICD-10-CM | POA: Diagnosis not present

## 2019-08-14 DIAGNOSIS — Z01419 Encounter for gynecological examination (general) (routine) without abnormal findings: Secondary | ICD-10-CM | POA: Diagnosis not present

## 2019-08-14 DIAGNOSIS — R9389 Abnormal findings on diagnostic imaging of other specified body structures: Secondary | ICD-10-CM | POA: Diagnosis not present

## 2019-08-14 DIAGNOSIS — Z124 Encounter for screening for malignant neoplasm of cervix: Secondary | ICD-10-CM | POA: Diagnosis not present

## 2019-08-20 ENCOUNTER — Telehealth: Payer: Self-pay

## 2019-08-20 NOTE — Telephone Encounter (Signed)
Left message on voicemail for patient to call back to schedule her follow up appointment with Dr Loanne Drilling for first available.   Patient is over due for appointment.

## 2019-09-24 ENCOUNTER — Ambulatory Visit: Payer: BC Managed Care – PPO | Admitting: Endocrinology

## 2019-10-05 ENCOUNTER — Telehealth: Payer: Self-pay

## 2019-10-05 ENCOUNTER — Telehealth: Payer: Self-pay | Admitting: Endocrinology

## 2019-10-05 ENCOUNTER — Encounter: Payer: Self-pay | Admitting: Endocrinology

## 2019-10-05 ENCOUNTER — Other Ambulatory Visit: Payer: Self-pay

## 2019-10-05 ENCOUNTER — Ambulatory Visit (INDEPENDENT_AMBULATORY_CARE_PROVIDER_SITE_OTHER): Payer: BC Managed Care – PPO | Admitting: Endocrinology

## 2019-10-05 VITALS — BP 110/70 | HR 91 | Ht 64.0 in | Wt 143.0 lb

## 2019-10-05 DIAGNOSIS — E1021 Type 1 diabetes mellitus with diabetic nephropathy: Secondary | ICD-10-CM

## 2019-10-05 LAB — POCT GLYCOSYLATED HEMOGLOBIN (HGB A1C): Hemoglobin A1C: 11.6 % — AB (ref 4.0–5.6)

## 2019-10-05 LAB — TSH: TSH: 0.87 u[IU]/mL (ref 0.35–4.50)

## 2019-10-05 MED ORDER — ONETOUCH ULTRA VI STRP: 1.0000 | ORAL_STRIP | Freq: Four times a day (QID) | 3 refills | Status: DC

## 2019-10-05 MED ORDER — "PARADIGM QUICK-SET 43"" 9MM MISC": 1.0000 | 3 refills | Status: DC

## 2019-10-05 MED ORDER — LEVOTHYROXINE SODIUM 112 MCG PO TABS: 112.0000 ug | ORAL_TABLET | Freq: Every day | ORAL | 3 refills | Status: DC

## 2019-10-05 MED ORDER — INSULIN LISPRO 100 UNIT/ML ~~LOC~~ SOLN: 50.0000 [IU] | SUBCUTANEOUS | 3 refills | Status: DC

## 2019-10-05 MED ORDER — PARADIGM PUMP RESERVOIR 3ML MISC: 1.0000 | 3 refills | Status: DC

## 2019-10-05 NOTE — Telephone Encounter (Signed)
Pt seen today for f/u DM with Dr. Loanne Drilling. Following education was given to BOTH pt and spouse by Dr. Loanne Drilling, A. Edgardo Petrenko, LPN and N. Mincy, LPN as follows:  1. MUST have thyroid panel drawn today. Once results received and reviewed, a Rx refill can be sent. Cannot refill without current labs. 2. Amlodipine can be refilled ONLY for a 30 day supply. MUST establish with a PCP for future refills. Provided BOTH husband and pt with a list of new PCP's and advised to establish care with a provider that is covered by their insurance plan. 3. Provided with list if DME suppliers for refills of pump supplies. Refills CANNOT be sent to [facility] because they no longer manage supplies for medical equipment. Advised they MUST establish with a DME supplier covered by their plan so that the proper paperwork can be completed and submitted appropriately. 4. FMLA forms CANNOT be completed and returned today. Dr. Loanne Drilling has the forms and will complete these forms according to policy. Forms will also be processed according to policy.

## 2019-10-05 NOTE — Telephone Encounter (Signed)
Called home # and spoke with Edd Arbour (spouse). Informed about Dr. Cordelia Pen response below. States he will come by the office and pick them up for PCP to complete. Placed forms at front desk for pick up.

## 2019-10-05 NOTE — Telephone Encounter (Signed)
Sorry, but this is the best I can do

## 2019-10-05 NOTE — Patient Instructions (Addendum)
A thyroid blood test is requested for you today.  We'll let you know about the results. please take these settings: basal rate of 0.5 units/hr from 2 AM-2 PM, and 1.5 units/hr 2 PM-2 AM.   No mealtime bolus.   correction bolus (which some people call "sensitivity," or "insulin sensitivity ratio," or just "isr") of 1 unit whenever your blood sugar is over 300.  You can do as often as every hour if necessary.   If you are going to be active, suspend the pump for 1-2 hrs.  check your blood sugar 4 times a day: the 3 meals, and at bedtime.  also check if you have symptoms of your blood sugar being too high or too low.  please keep a record of the readings and bring it to your next appointment here (or you can bring the meter itself).  You can write it on any piece of paper.  please call us sooner if your blood sugar goes below 70, or if you have a lot of readings over 200.  Please get set up with with a new primary care provider.  Here are some names and phone numbers.  Please come back for a follow-up appointment in 1 month.

## 2019-10-05 NOTE — Telephone Encounter (Signed)
LAB RESULTS  Lab results were reviewed by Dr. Loanne Drilling. A letter has been mailed to pt home address. For future reference, letter can be found in [place].  Outpatient Medication Detail   Disp Refills Start End   levothyroxine (SYNTHROID) 112 MCG tablet 90 tablet 3 10/05/2019    Sig - Route: Take 1 tablet (112 mcg total) by mouth daily before breakfast. - Oral   Sent to pharmacy as: levothyroxine (SYNTHROID) 112 MCG tablet   E-Prescribing Status: Receipt confirmed by pharmacy (10/05/2019 1:21 PM EDT)    Pharmacy  [facility], [place]  [address] [place] [address]  Phone:  [phone] Fax:  [phone]

## 2019-10-05 NOTE — Telephone Encounter (Signed)
Patient calling back very upset because she wants to know why Dr. Loanne Drilling will not fill out FMLA paperwork since he is the specialist handling her DM and hypothyroid she is weak having trouble walking and can not work - he was supposed to send in a 30 day supply of levothyroxine which she has not received-patient is very confused about what happened in her visit today she states he just told her a bunch of stuff and walked out so she does not know what is going on and she feels that no one has time to talk to her about her condition-was told by Dr. Loanne Drilling that he could do the [facility] paperwork and now she is being told he can't she feels he is forgetting what he said to her in the visit and the nurse just told her she could leave even though the MD never told her the visit was over Never told her the visit was over-Noah was supposed to come back to give the patient her pump he was supposed to set it up and he told the patient he had to call [employer] to get it set and he never came back so they ended up sitting in a room for an hour and they were not told what to do patient stated she is not asking for permanent FMLA but she does need this to be done by her specialist-patient is requesting to speak directly to Dr. Loanne Drilling about this matter and I did inform her that he will be with patients all day but she said she did not care she needs to speak to him anyway-please call her back at either [phone] or [phone]

## 2019-10-05 NOTE — Telephone Encounter (Signed)
Please review

## 2019-10-05 NOTE — Telephone Encounter (Signed)
In light of pt confusion, this entry was made to explain all that occurred during today's visit:  Pt seen today for f/u DM with Dr. Loanne Drilling. Following education was given to BOTH pt and spouse by Dr. Loanne Drilling, A. Maciah Schweigert, LPN and N. Mincy, LPN as follows:   MUST have thyroid panel drawn today. Once results received and reviewed, a Rx refill can be sent. Cannot refill without current labs. Amlodipine can be refilled ONLY for a 30 day supply. MUST establish with a PCP for future refills. Provided BOTH husband and pt with a list of new PCP's and advised to establish care with a provider that is covered by their insurance plan. Provided with list if DME suppliers for refills of pump supplies. Refills CANNOT be sent to [facility] because they no longer manage supplies for medical equipment. Advised they MUST establish with a DME supplier covered by their plan so that the proper paperwork can be completed and submitted appropriately. FMLA forms CANNOT be completed and returned today. Dr. Loanne Drilling has the forms and will complete these forms according to policy. Forms will also be processed according to policy.  As indicated under #1, labs WERE obtained and have now been reviewed and addressed as follows: [employer]  Result Notes  Renato Shin, MD  10/05/2019  1:20 PM EDT    please contact patient: Thyroid level is normal--good.  Please continue the same medication.  I have sent a prescription to your pharmacy, to refill.     Outpatient Medication Detail   Disp Refills Start End   levothyroxine (SYNTHROID) 112 MCG tablet 90 tablet 3 10/05/2019    Sig - Route: Take 1 tablet (112 mcg total) by mouth daily before breakfast. - Oral   Sent to pharmacy as: levothyroxine (SYNTHROID) 112 MCG tablet   E-Prescribing Status: Receipt confirmed by pharmacy (10/05/2019  1:21 PM EDT)    With regards to FMLA forms, pt spouse was informed that pt DM and thyroid ARE stable and does not explain nor  qualify her for TOTAL CARE. If she is TOTAL CARE, this must come from PCP.

## 2019-10-05 NOTE — Progress Notes (Signed)
Subjective:    Patient ID: Katherine Navarro, female    DOB: 1956/04/18, 64 y.o.   MRN: [id number]  HPI Pt returns for f/u of diabetes mellitus:   DM type: 1  Dx'ed: 1978.   Complications: polyneuropathy, retinopathy and nephropathy.   Therapy: insulin since dx GDM: never DKA: never Severe hypoglycemia: once (1988).  Pancreatitis: never.  Other: on pump rx (now minimed paradigm) since approx 2005; therapy has been limited by noncompliance with cbg recording, and dosing via her pump; pump settings have been adjusted to emphasize basal rate; she sleeps from 4 AM-11 AM, and takes frequent naps during the day.   Interval history: she takes these settings:  basal rate of 0.55 units/hr. No mealtime bolus of 1 unit/15 grams carbohydrate.  correction bolus (which some people call "sensitivity," or "insulin sensitivity ratio," or just "isr") of 1 unit for each 51 by which cbg exceeds 120. If you are going to be active, suspends the pump for 1-2 hrs.  She does not have primary care provider.  Main symptom is generalized weakness.  Denies pump problems.  Meter is downloaded today, and the printout is scanned into the record.  cbg varies from 271-400.  There is no trend throughout the day. She says she takes synthroid as rx'ed.   Past Medical History:  Diagnosis Date  . CONSTIPATION, CHRONIC 12/13/2007  . DIABETES MELLITUS, TYPE I 01/07/2007  . DM nephropathy/sclerosis   . GLAUCOMA 12/13/2007  . HYPERCHOLESTEROLEMIA 12/13/2007  . HYPERTENSION 01/07/2007  . Hyperthyroidism   . INTERNAL HEMORRHOIDS 07/23/2008  . LEUKOPENIA, CHRONIC 12/13/2007  . Nonproliferative diabetic retinopathy [id number] 12/13/2007  . Unspecified hypothyroidism 12/13/2007  . VARICOSE VEINS, LOWER EXTREMITIES 12/13/2007    Past Surgical History:  Procedure Laterality Date  . ELECTROCARDIOGRAM  08/24/2006  . EYE SURGERY     bilateral  . LEEP N/A 12/01/2012   Procedure: LOOP ELECTROSURGICAL EXCISION PROCEDURE (LEEP);  Surgeon:  Delice Lesch, MD;  Location: [facility];  Service: Gynecology;  Laterality: N/A;  . Stress Myoview   05/18/2004  . TUBAL LIGATION      Social History   Socioeconomic History  . Marital status: Married    Spouse name: Not on file  . Number of children: Not on file  . Years of education: Not on file  . Highest education level: Not on file  Occupational History    Comment: Doesn't work outside the home  Tobacco Use  . Smoking status: Never Smoker  . Smokeless tobacco: Never Used  Substance and Sexual Activity  . Alcohol use: No  . Drug use: No  . Sexual activity: Not on file  Other Topics Concern  . Not on file  Social History Narrative   Pt gets regular exercise.         Social Determinants of Health   Financial Resource Strain:   . Difficulty of Paying Living Expenses:   Food Insecurity:   . Worried About Charity fundraiser in the Last Year:   . Arboriculturist in the Last Year:   Transportation Needs:   . Film/video editor (Medical):   Marland Kitchen Lack of Transportation (Non-Medical):   Physical Activity:   . Days of Exercise per Week:   . Minutes of Exercise per Session:   Stress:   . Feeling of Stress :   Social Connections:   . Frequency of Communication with Friends and Family:   . Frequency of Social Gatherings with Friends and Family:   .  Attends Religious Services:   . Active Member of Clubs or Organizations:   . Attends Archivist Meetings:   Marland Kitchen Marital Status:   Intimate Partner Violence:   . Fear of Current or Ex-Partner:   . Emotionally Abused:   Marland Kitchen Physically Abused:   . Sexually Abused:     Current Outpatient Medications on File Prior to Visit  Medication Sig Dispense Refill  . alum & mag hydroxide-simeth (MAALOX PLUS) 400-400-40 MG/5ML suspension Take 10 mLs by mouth every 6 (six) hours as needed for indigestion. 355 mL 0  . amiloride-hydrochlorothiazide (MODURETIC) 5-50 MG tablet Take 0.5 tablets by mouth daily. SEND FUTURE REFILLS TO  NEW PCP 15 tablet 0  . Cod Liver Oil CAPS Take 1 capsule by mouth daily.      . colestipol (COLESTID) 5 g granules Take 5 g by mouth daily. 500 g 3  . dorzolamide-timolol (COSOPT) 22.3-6.8 MG/ML ophthalmic solution 1 drop 2 (two) times daily.    . fluticasone (FLONASE) 50 MCG/ACT nasal spray INSTILL 2 SPRAYS IN BOTH NOSTRILS DAILY 16 g 11  . Glucosamine 500 MG TABS Take 1 tablet by mouth daily.      . hydrocortisone-pramoxine (ANALPRAM-HC) 2.5-1 % rectal cream Place rectally as needed for hemorrhoids. Apply to rectum as needed 30 g 1  . ibuprofen (ADVIL,MOTRIN) 600 MG tablet Take 1 tablet (600 mg total) by mouth every 6 (six) hours as needed for pain. 30 tablet 0  . magnesium oxide (MAG-OX) 400 MG tablet Take 2 tablets once daily     . norethindrone (MICRONOR,CAMILA,ERRIN) 0.35 MG tablet Take 1 tablet by mouth daily.    . ondansetron (ZOFRAN ODT) 4 MG disintegrating tablet Take 1 tablet (4 mg total) by mouth every 8 (eight) hours as needed for nausea or vomiting. 20 tablet 0  . vitamin E 400 UNIT capsule Take 2 capsules by mouth twice a day as needed      No current facility-administered medications on file prior to visit.    Allergies  Allergen Reactions  . Atorvastatin     REACTION: perceived myalgias  . Ciprofloxacin     REACTION: pt states med  make her sick  . Epinephrine     REACTION: thyroid problems  . Erythromycin   . Morphine   . Penicillins     REACTION: hives    Family History  Problem Relation Age of Onset  . Cancer Mother        Breast Cancer  . Diabetes Mother   . Breast cancer Mother   . Diabetes Father     BP 110/70   Pulse 91   Ht 5\' 4"  (1.626 m)   Wt 143 lb (64.9 kg)   LMP 05/03/2013   SpO2 94%   BMI 24.55 kg/m   Review of Systems She has lost a few lbs.      Objective:   Physical Exam VITAL SIGNS:  See vs page GENERAL: no distress.  In wheelchair Pulses: dorsalis pedis intact bilat.   MSK: no deformity of the feet CV: no leg edema Skin:   no ulcer on the feet.  normal temp on the feet, but the feet are hyperpigmented.  Neuro: sensation is intact to touch on the feet, but decreased from normal.   Lab Results  Component Value Date   HGBA1C 11.6 (A) 10/05/2019   Lab Results  Component Value Date   TSH 0.87 10/05/2019      Assessment & Plan:  Type 1 DM, with  PN: ongoing poor glycemic control Severe noncompliance with meds, insulin dosing, f/u ov's, and getting a new primary care provider.  Hypothyroidism: well-replaced.  Please continue the same medication Generalized weakness, new. uncertain etiology.  Pt is advised she needs to get a new primary care provider to eval this.   Patient Instructions  A thyroid blood test is requested for you today.  We'll let you know about the results. please take these settings: basal rate of 0.5 units/hr from 2 AM-2 PM, and 1.5 units/hr 2 PM-2 AM.   No mealtime bolus.   correction bolus (which some people call "sensitivity," or "insulin sensitivity ratio," or just "isr") of 1 unit whenever your blood sugar is over 300.  You can do as often as every hour if necessary.   If you are going to be active, suspend the pump for 1-2 hrs.  check your blood sugar 4 times a day: the 3 meals, and at bedtime.  also check if you have symptoms of your blood sugar being too high or too low.  please keep a record of the readings and bring it to your next appointment here (or you can bring the meter itself).  You can write it on any piece of paper.  please call us sooner if your blood sugar goes below 70, or if you have a lot of readings over 200.  Please get set up with with a new primary care provider.  Here are some names and phone numbers.  Please come back for a follow-up appointment in 1 month.

## 2019-10-05 NOTE — Telephone Encounter (Signed)
please contact patient: I have reviewed FMLA form.  The high blood sugar and thyroid do not necessarily explain the need for total care.  Please return thje FMLA form to husband, do that he can ask primary care provider about this.

## 2019-10-05 NOTE — Telephone Encounter (Signed)
-----   Message from Renato Shin, MD sent at 10/05/2019  1:20 PM EDT ----- please contact patient: Thyroid level is normal--good.  Please continue the same medication.  I have sent a prescription to your pharmacy, to refill.

## 2019-10-31 ENCOUNTER — Encounter: Payer: Self-pay | Admitting: Physician Assistant

## 2019-10-31 ENCOUNTER — Ambulatory Visit (INDEPENDENT_AMBULATORY_CARE_PROVIDER_SITE_OTHER): Payer: BC Managed Care – PPO | Admitting: Physician Assistant

## 2019-10-31 ENCOUNTER — Other Ambulatory Visit: Payer: Self-pay

## 2019-10-31 VITALS — BP 130/82 | HR 82 | Temp 96.7°F | Ht 64.0 in | Wt 149.6 lb

## 2019-10-31 DIAGNOSIS — R87619 Unspecified abnormal cytological findings in specimens from cervix uteri: Secondary | ICD-10-CM | POA: Insufficient documentation

## 2019-10-31 DIAGNOSIS — I10 Essential (primary) hypertension: Secondary | ICD-10-CM

## 2019-10-31 DIAGNOSIS — K625 Hemorrhage of anus and rectum: Secondary | ICD-10-CM

## 2019-10-31 DIAGNOSIS — D259 Leiomyoma of uterus, unspecified: Secondary | ICD-10-CM | POA: Insufficient documentation

## 2019-10-31 DIAGNOSIS — E1159 Type 2 diabetes mellitus with other circulatory complications: Secondary | ICD-10-CM

## 2019-10-31 DIAGNOSIS — E559 Vitamin D deficiency, unspecified: Secondary | ICD-10-CM | POA: Insufficient documentation

## 2019-10-31 DIAGNOSIS — N882 Stricture and stenosis of cervix uteri: Secondary | ICD-10-CM | POA: Insufficient documentation

## 2019-10-31 DIAGNOSIS — Z7409 Other reduced mobility: Secondary | ICD-10-CM | POA: Diagnosis not present

## 2019-10-31 DIAGNOSIS — R29898 Other symptoms and signs involving the musculoskeletal system: Secondary | ICD-10-CM

## 2019-10-31 DIAGNOSIS — I152 Hypertension secondary to endocrine disorders: Secondary | ICD-10-CM

## 2019-10-31 DIAGNOSIS — Z789 Other specified health status: Secondary | ICD-10-CM

## 2019-10-31 DIAGNOSIS — Z1211 Encounter for screening for malignant neoplasm of colon: Secondary | ICD-10-CM

## 2019-10-31 LAB — COMPREHENSIVE METABOLIC PANEL
ALT: 13 U/L (ref 0–35)
AST: 16 U/L (ref 0–37)
Albumin: 3.7 g/dL (ref 3.5–5.2)
Alkaline Phosphatase: 91 U/L (ref 39–117)
BUN: 28 mg/dL — ABNORMAL HIGH (ref 6–23)
CO2: 28 mEq/L (ref 19–32)
Calcium: 10.7 mg/dL — ABNORMAL HIGH (ref 8.4–10.5)
Chloride: 96 mEq/L (ref 96–112)
Creatinine, Ser: 0.98 mg/dL (ref 0.40–1.20)
GFR: 69.54 mL/min (ref >60.00)
Glucose, Bld: 296 mg/dL — ABNORMAL HIGH (ref 70–99)
Potassium: 4 mEq/L (ref 3.5–5.1)
Sodium: 133 mEq/L — ABNORMAL LOW (ref 135–145)
Total Bilirubin: 0.4 mg/dL (ref 0.2–1.2)
Total Protein: 6.5 g/dL (ref 6.0–8.3)

## 2019-10-31 LAB — CBC WITH DIFFERENTIAL/PLATELET
Basophils Absolute: 0 10*3/uL (ref 0.0–0.1)
Basophils Relative: 0.6 % (ref 0.0–3.0)
Eosinophils Absolute: 0.1 10*3/uL (ref 0.0–0.7)
Eosinophils Relative: 1.8 % (ref 0.0–5.0)
HCT: 40.3 % (ref 36.0–46.0)
Hemoglobin: 13.2 g/dL (ref 12.0–15.0)
Lymphocytes Relative: 15.5 % (ref 12.0–46.0)
Lymphs Abs: 0.9 10*3/uL (ref 0.7–4.0)
MCHC: 32.7 g/dL (ref 30.0–36.0)
MCV: 87.6 fl (ref 78.0–100.0)
Monocytes Absolute: 0.4 10*3/uL (ref 0.1–1.0)
Monocytes Relative: 6.9 % (ref 3.0–12.0)
Neutro Abs: 4.3 10*3/uL (ref 1.4–7.7)
Neutrophils Relative %: 75.2 % (ref 43.0–77.0)
Platelets: 270 10*3/uL (ref 150.0–400.0)
RBC: 4.6 Mil/uL (ref 3.87–5.11)
RDW: 13.7 % (ref 11.5–15.5)
WBC: 5.7 10*3/uL (ref 4.0–10.5)

## 2019-10-31 LAB — VITAMIN D 25 HYDROXY (VIT D DEFICIENCY, FRACTURES): VITD: 31.41 ng/mL (ref 30.00–100.00)

## 2019-10-31 LAB — VITAMIN B12: Vitamin B-12: 525 pg/mL (ref 211–911)

## 2019-10-31 LAB — CK: Total CK: 87 U/L (ref 7–177)

## 2019-10-31 MED ORDER — AMILORIDE-HYDROCHLOROTHIAZIDE 5-50 MG PO TABS: 0.5000 | ORAL_TABLET | Freq: Every day | ORAL | 2 refills | Status: DC

## 2019-10-31 NOTE — Patient Instructions (Signed)
It was great to see you!  You will be contacted about your referral to Neurology and Gastroenterology.  If you have any changes to your leg weakness, or new issues with bowel/bladder incontinence please go to the ER.   I will be in touch with your lab results.  Take care,  Inda Coke PA-C

## 2019-10-31 NOTE — Progress Notes (Signed)
Katherine Navarro is a 64 y.o. female is here to establish care.  I acted as a Education administrator for [employer], PA-C [facility], [place]  History of Present Illness:   Chief Complaint  Patient presents with  . New Patient (Initial Visit)    HPI  New patient Establishing care from Dr. Renato Shin, who is managing her DM1 and Graves disease.  She is here with her husband Katherine Navarro who also supplements her history.   Lower extremity weakness Patient reports sudden onset of lower extremity weakness and inability to perform ADLs in February 2021.  She states that she thinks that this was caused by a medication that her OB/GYN put her on, Micronor.  She states that she was so weak that she was unable to go to the emergency room.  She was unable to stand and she had episodes of urinary incontinence that were abnormal for her.  On August 02 2019 she went to an urgent care.  They checked labs and they found her TSH to be 77.  She had reported while there that she had stopped all of her medication -- unclear duration of time.  She was told to follow-up with her PCP for further evaluation and possibly will need an MRI of the brain.  Patient reports that she followed up with her endocrinologist 2 months later and she was told that she needs to find a primary care provider to further evaluate this.  She states that since that time slowly, her strength has improved and now she is able to walk.  She has no more issues with incontinence.  She does still need assistance with standing and performing her ADLs. Her husband has stopped going to work as of the end of March in order to be her caregiver 100% of the time.  Her diabetes remains significantly uncontrolled, but this appears to be her norm.  She denies any excessive alcohol intake.   HTN Currently taking Amiloride-HCTZ 5-50 mg. At home blood pressure readings are: normal. Patient denies chest pain, SOB, blurred vision, dizziness, unusual headaches, lower  leg swelling. Patient is compliant with medication. Denies excessive caffeine intake, stimulant usage, excessive alcohol intake, or increase in salt consumption.  BP Readings from Last 3 Encounters:  10/31/19 130/82  10/05/19 110/70  08/02/19 (!) 144/80   DM1 Currently using an insulin pump. Checks blood sugars manually. Sees Dr. Loanne Drilling for her diabetes. Her most recent HgbA1c was 11.6 -- from chart review it looks like this is her baseline for at least 2 years. Lab Results  Component Value Date   HGBA1C 11.6 (A) 10/05/2019   Occasional rectal bleeding Last colonoscopy was 11 years ago. She has history of chronic constipation, intermittent rectal bleeding, and has increased flatulence recently. She would like a GI referral.  Vitamin D deficiency Patient reports vitamin D deficiency and would like labs checked today.  Health Maintenance Due  Topic Date Due  . COVID-19 Vaccine (1) Never done  . OPHTHALMOLOGY EXAM  01/19/2018  . COLONOSCOPY  09/03/2018  . URINE MICROALBUMIN  12/17/2018    Past Medical History:  Diagnosis Date  . CONSTIPATION, CHRONIC 12/13/2007  . DIABETES MELLITUS, TYPE I 01/07/2007  . DM nephropathy/sclerosis   . GLAUCOMA 12/13/2007  . HYPERCHOLESTEROLEMIA 12/13/2007  . Hyperthyroidism   . INTERNAL HEMORRHOIDS 07/23/2008  . LEUKOPENIA, CHRONIC 12/13/2007  . Nonproliferative diabetic retinopathy [id number] 12/13/2007  . Unspecified hypothyroidism 12/13/2007  . VARICOSE VEINS, LOWER EXTREMITIES 12/13/2007     Social History  Socioeconomic History  . Marital status: Married    Spouse name: Not on file  . Number of children: Not on file  . Years of education: Not on file  . Highest education level: Not on file  Occupational History    Comment: Doesn't work outside the home  Tobacco Use  . Smoking status: Never Smoker  . Smokeless tobacco: Never Used  Substance and Sexual Activity  . Alcohol use: No  . Drug use: No  . Sexual activity: Not on file  Other  Topics Concern  . Not on file  Social History Narrative   Pt gets regular exercise.         Social Determinants of Health   Financial Resource Strain:   . Difficulty of Paying Living Expenses:   Food Insecurity:   . Worried About Charity fundraiser in the Last Year:   . Arboriculturist in the Last Year:   Transportation Needs:   . Film/video editor (Medical):   Marland Kitchen Lack of Transportation (Non-Medical):   Physical Activity:   . Days of Exercise per Week:   . Minutes of Exercise per Session:   Stress:   . Feeling of Stress :   Social Connections:   . Frequency of Communication with Friends and Family:   . Frequency of Social Gatherings with Friends and Family:   . Attends Religious Services:   . Active Member of Clubs or Organizations:   . Attends Archivist Meetings:   Marland Kitchen Marital Status:   Intimate Partner Violence:   . Fear of Current or Ex-Partner:   . Emotionally Abused:   Marland Kitchen Physically Abused:   . Sexually Abused:     Past Surgical History:  Procedure Laterality Date  . ELECTROCARDIOGRAM  08/24/2006  . EYE SURGERY     bilateral  . LEEP N/A 12/01/2012   Procedure: LOOP ELECTROSURGICAL EXCISION PROCEDURE (LEEP);  Surgeon: Delice Lesch, MD;  Location: [facility];  Service: Gynecology;  Laterality: N/A;  . Stress Myoview   05/18/2004  . TUBAL LIGATION      Family History  Problem Relation Age of Onset  . Cancer Mother        Breast Cancer  . Diabetes Mother   . Breast cancer Mother   . Diabetes Father     PMHx, SurgHx, SocialHx, FamHx, Medications, and Allergies were reviewed in the Visit Navigator and updated as appropriate.   Patient Active Problem List   Diagnosis Date Noted  . Atypical glandular cells on cervical Pap smear 10/31/2019  . Stenosis of cervix 10/31/2019  . Uterine leiomyoma 10/31/2019  . Vitamin D deficiency 10/31/2019  . Endometrial hyperplasia 12/04/2015  . Graves' orbitopathy 07/29/2015  . Proliferative diabetic retinopathy  of both eyes with macular edema associated with type 2 diabetes mellitus ([place]) 03/25/2015  . Numbness 11/19/2014  . 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 Insulin pump in place 10/02/2013  . Arthralgia 05/14/2013  . Cervical intraepithelial neoplasia grade III with severe dysplasia 12/01/2012  . Constipation 07/23/2008  . Rectal bleeding 07/23/2008  . Hypothyroidism (acquired) 12/13/2007  . Hyperlipidemia associated with type 2 diabetes mellitus ([place]) 12/13/2007  . Diabetes mellitus ([place]) 01/07/2007  . Hypertension associated with diabetes ([place]) 01/07/2007    Social History   Tobacco Use  . Smoking status: Never Smoker  . Smokeless tobacco: Never Used  Substance Use Topics  . Alcohol use: No  . Drug use: No    Current Medications  and Allergies:    Current Outpatient Medications:  .  alum & mag hydroxide-simeth (MAALOX PLUS) 400-400-40 MG/5ML suspension, Take 10 mLs by mouth every 6 (six) hours as needed for indigestion., Disp: 355 mL, Rfl: 0 .  amiloride-hydrochlorothiazide (MODURETIC) 5-50 MG tablet, Take 0.5 tablets by mouth daily., Disp: 45 tablet, Rfl: 2 .  Cod Liver Oil CAPS, Take 1 capsule by mouth daily.  , Disp: , Rfl:  .  dorzolamide-timolol (COSOPT) 22.3-6.8 MG/ML ophthalmic solution, 1 drop 2 (two) times daily., Disp: , Rfl:  .  fluticasone (FLONASE) 50 MCG/ACT nasal spray, INSTILL 2 SPRAYS IN BOTH NOSTRILS DAILY, Disp: 16 g, Rfl: 11 .  Glucosamine 500 MG TABS, Take 1 tablet by mouth daily.  , Disp: , Rfl:  .  glucose blood (ONETOUCH ULTRA) test strip, 1 each by Other route 4 (four) times daily. And lancets 4/day, Disp: 360 each, Rfl: 3 .  hydrocortisone-pramoxine (ANALPRAM-HC) 2.5-1 % rectal cream, Place rectally as needed for hemorrhoids. Apply to rectum as needed, Disp: 30 g, Rfl: 1 .  Insulin Infusion Pump Supplies (MINIMED INFUSION SET-MMT 396) MISC, 1 Device by Other route every 3 (three) days., Disp: 30 each, Rfl: 3 .  Insulin Infusion  Pump Supplies (PARADIGM RESERVOIR 3ML) MISC, 1 Device by Other route every 3 (three) days., Disp: 30 each, Rfl: 3 .  insulin lispro (HUMALOG) 100 UNIT/ML injection, Inject 0.5 mLs (50 Units total) into the skin See admin instructions. 50 UNITS DAILY VIA PUMP, Disp: 40 mL, Rfl: 3 .  levothyroxine (SYNTHROID) 112 MCG tablet, Take 1 tablet (112 mcg total) by mouth daily before breakfast., Disp: 90 tablet, Rfl: 3 .  magnesium oxide (MAG-OX) 400 MG tablet, Take 2 tablets once daily , Disp: , Rfl:  .  ondansetron (ZOFRAN ODT) 4 MG disintegrating tablet, Take 1 tablet (4 mg total) by mouth every 8 (eight) hours as needed for nausea or vomiting., Disp: 20 tablet, Rfl: 0 .  vitamin E 400 UNIT capsule, Take 2 capsules by mouth twice a day as needed , Disp: , Rfl:    Allergies  Allergen Reactions  . Atorvastatin     REACTION: perceived myalgias  . Ciprofloxacin     REACTION: pt states med  make her sick  . Epinephrine     REACTION: thyroid problems  . Erythromycin   . Morphine   . Penicillins     REACTION: hives    Review of Systems   ROS  Negative unless otherwise specified per HPI.  Vitals:   Vitals:   10/31/19 1030  BP: 130/82  Pulse: 82  Temp: (!) 96.7 F (35.9 C)  TempSrc: Temporal  SpO2: 96%  Weight: 149 lb 9.6 oz (67.9 kg)  Height: 5\' 4"  (1.626 m)     Body mass index is 25.68 kg/m.   Physical Exam:    Physical Exam Vitals and nursing note reviewed.  Constitutional:      General: She is not in acute distress.    Appearance: She is well-developed. She is not ill-appearing or toxic-appearing.  Cardiovascular:     Rate and Rhythm: Normal rate and regular rhythm.     Pulses: Normal pulses.     Heart sounds: Normal heart sounds, S1 normal and S2 normal.     Comments: No LE edema Pulmonary:     Effort: Pulmonary effort is normal.     Breath sounds: Normal breath sounds.  Skin:    General: Skin is warm and dry.     Comments: Dark,  thickened skin to anterior bilateral  feet  Neurological:     Mental Status: She is alert.     Sensory: Sensory deficit present.     Gait: Gait abnormal.     Comments: Decreased strength with resistance to flexion and extension of bilateral legs.  Bilateral calves appear atrophied.  Psychiatric:        Speech: Speech normal.        Behavior: Behavior normal. Behavior is cooperative.      Assessment and Plan:    Ronnae was seen today for new patient (initial visit).  Diagnoses and all orders for this visit:  Weakness of both lower extremities; Impaired mobility and ADLs Unclear etiology of symptoms.  Fortunately her TSH when most recently checked is back to normal.  She does have a signs of neuropathy on exam, denies any prior history of this.  Will update labs, including RPR, CK, vitamin B12, and check for possible causes of symptoms.  If labs are unrevealing, will order MRI of brain and possibly spine.  She was given worsening precautions, with low threshold to present to ER if any significant changes to any of her symptoms.  We will also refer to neurology urgently for this. -     CBC with Differential/Platelet -     Comprehensive metabolic panel -     RPR -     CK (Creatine Kinase) -     Vitamin B12  Vitamin D deficiency Update vitamin D level per patient request. -     VITAMIN D 25 Hydroxy (Vit-D Deficiency, Fractures)  Rectal bleeding; special screening for malignant neoplasms, colon Referral to GI placed. -     Ambulatory referral to Gastroenterology  Hypertension associated with diabetes  Normotensive in office, refill medication.  Follow-up in 6 months.  other orders -     amiloride-hydrochlorothiazide (MODURETIC) 5-50 MG tablet; Take 0.5 tablets by mouth daily.  . Reviewed expectations re: course of current medical issues. . Discussed self-management of symptoms. . Outlined signs and symptoms indicating need for more acute intervention. . Patient verbalized understanding and all questions were  answered. . See orders for this visit as documented in the electronic medical record. . Patient received an After Visit Summary.  CMA or LPN served as scribe during this visit. History, Physical, and Plan performed by medical provider. The above documentation has been reviewed and is accurate and complete.  I spent >65 minutes with this patient, greater than 50% was face-to-face time counseling regarding the above diagnoses.   Inda Coke, PA-C [place], [place] 10/31/2019  Follow-up: No follow-ups on file.

## 2019-11-01 ENCOUNTER — Encounter: Payer: Self-pay | Admitting: Neurology

## 2019-11-01 LAB — RPR: RPR Ser Ql: NONREACTIVE

## 2019-11-02 ENCOUNTER — Other Ambulatory Visit: Payer: Self-pay | Admitting: Physician Assistant

## 2019-11-02 ENCOUNTER — Telehealth: Payer: Self-pay | Admitting: Physician Assistant

## 2019-11-02 DIAGNOSIS — R29818 Other symptoms and signs involving the nervous system: Secondary | ICD-10-CM

## 2019-11-02 DIAGNOSIS — G1220 Motor neuron disease, unspecified: Secondary | ICD-10-CM

## 2019-11-02 NOTE — Telephone Encounter (Signed)
See result notes. 

## 2019-11-02 NOTE — Telephone Encounter (Signed)
Patient is returning Jasmine's call for lab results.

## 2019-11-02 NOTE — Progress Notes (Signed)
MRI ordered

## 2019-11-05 ENCOUNTER — Ambulatory Visit: Payer: BC Managed Care – PPO | Admitting: Endocrinology

## 2019-11-21 ENCOUNTER — Ambulatory Visit (HOSPITAL_COMMUNITY)
Admission: RE | Admit: 2019-11-21 | Discharge: 2019-11-21 | Disposition: A | Discharge status: Home / Self Care | Payer: BC Managed Care – PPO | Source: Ambulatory Visit | Attending: Physician Assistant | Admitting: Physician Assistant

## 2019-11-21 ENCOUNTER — Other Ambulatory Visit: Payer: Self-pay

## 2019-11-21 DIAGNOSIS — M48061 Spinal stenosis, lumbar region without neurogenic claudication: Secondary | ICD-10-CM | POA: Diagnosis not present

## 2019-11-21 DIAGNOSIS — R531 Weakness: Secondary | ICD-10-CM | POA: Diagnosis not present

## 2019-11-21 DIAGNOSIS — R29818 Other symptoms and signs involving the nervous system: Secondary | ICD-10-CM | POA: Diagnosis not present

## 2019-11-21 DIAGNOSIS — G1220 Motor neuron disease, unspecified: Secondary | ICD-10-CM | POA: Diagnosis not present

## 2019-11-21 DIAGNOSIS — M4802 Spinal stenosis, cervical region: Secondary | ICD-10-CM | POA: Diagnosis not present

## 2019-11-21 IMAGING — MR MR CERVICAL SPINE W/O CM
5 series · 39 of 48 positions shown · non-contrast
Comparison: None.

CLINICAL DATA: Moderate neuro disease.  Lower extremity weakness.

EXAM:
MRI CERVICAL SPINE WITHOUT CONTRAST
TECHNIQUE: Multiplanar, multisequence MR imaging of the cervical spine was
performed. No intravenous contrast was administered.

[Series 5: T1 · sagittal · 3.0mm · 0.69mm/px · 6 of 17 slices shown]
[im 1/17]
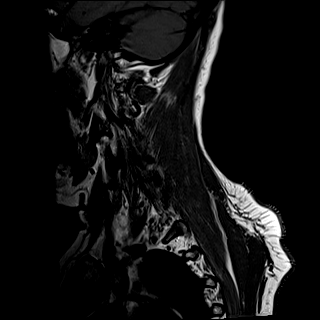
[im 4/17]
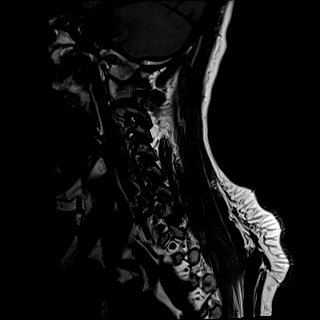
[im 7/17]
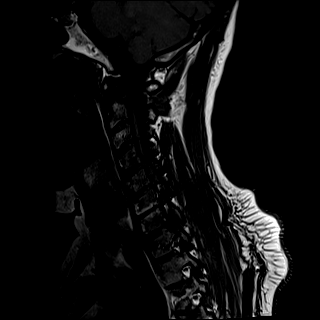
[im 10/17]
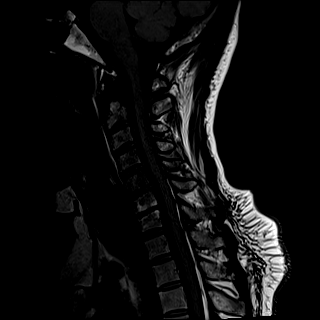
[im 13/17]
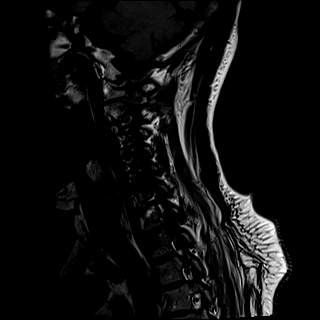
[im 17/17]
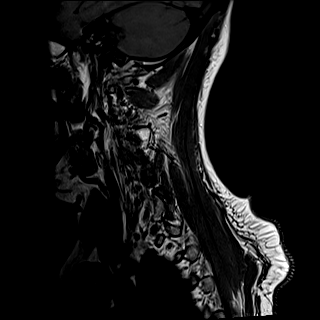

[Series 6: T2 · sagittal · 3.0mm · 0.69mm/px · 7 of 17 slices shown (1 of 2)]
[im 1/17]
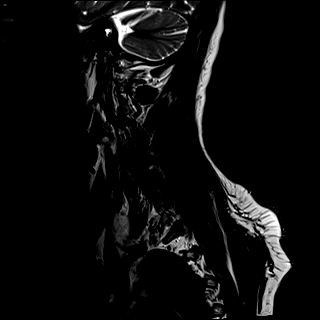
[im 3/17]
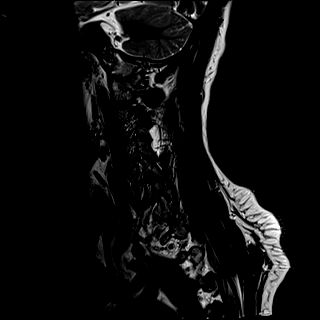
[im 6/17]
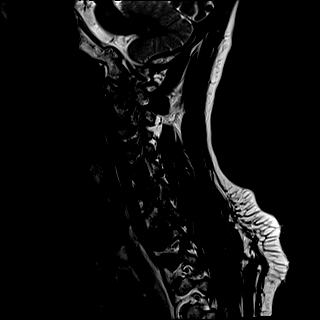
[im 9/17]
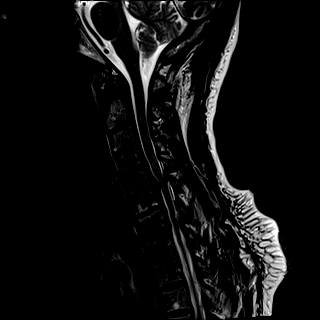
[im 11/17]
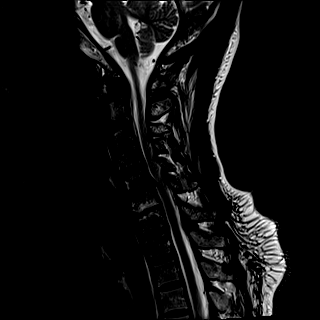
[im 14/17]
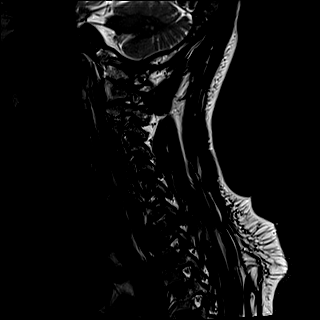
[im 17/17]
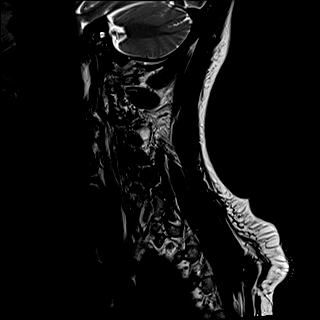

[Series 7: STIR · sagittal · 3.0mm · 0.86mm/px · 7 of 17 slices shown]
[im 1/17]
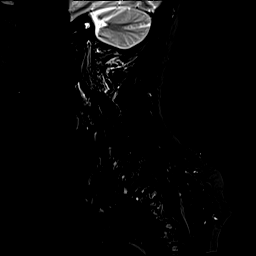
[im 3/17]
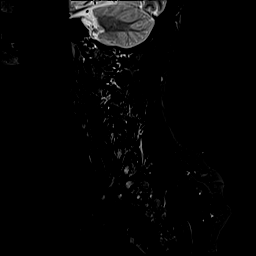
[im 6/17]
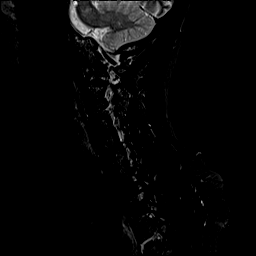
[im 9/17]
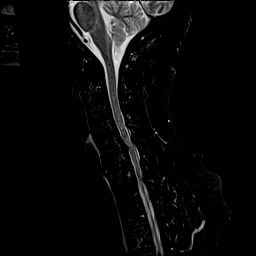
[im 11/17]
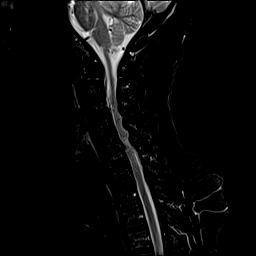
[im 14/17]
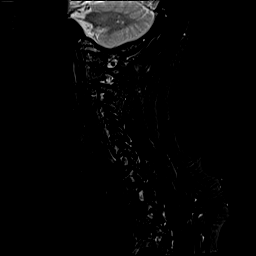
[im 17/17]
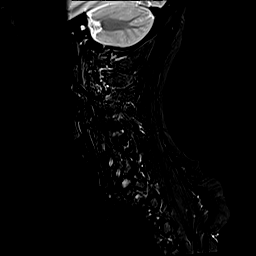

[Series 8: T2 · axial · 3.0mm · 0.70mm/px · z∈[-47,+59]mm · 11 of 35 slices shown (2 of 2)]
[im 1/35]
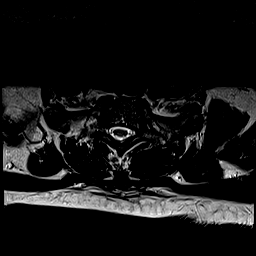
[im 3/35]
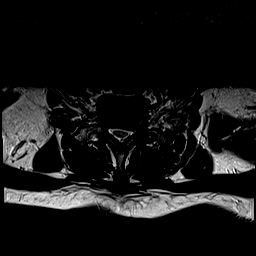
[im 6/35]
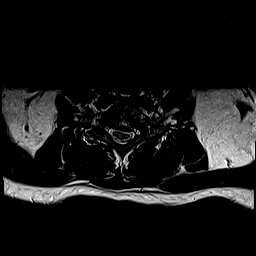
[im 8/35]
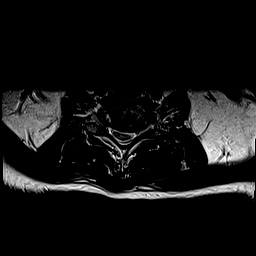
[im 11/35]
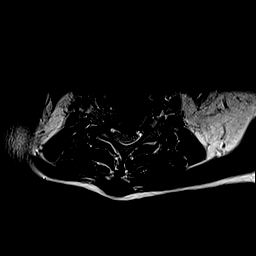
[im 14/35]
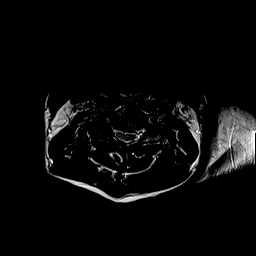
[im 16/35]
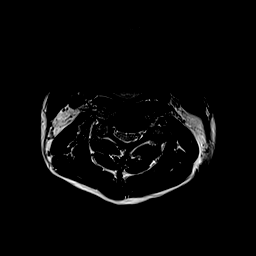
[im 19/35]
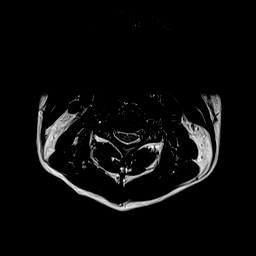
[im 24/35]
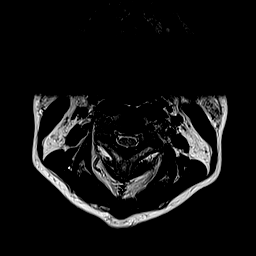
[im 29/35]
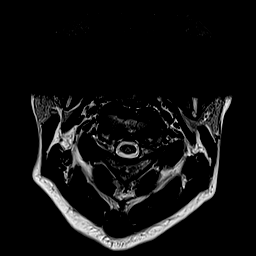
[im 35/35]
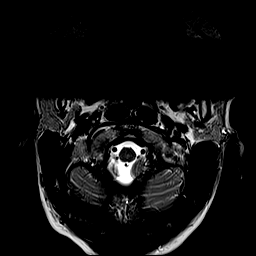

[Series 9: GRE · axial · 3.0mm · 0.35mm/px · z∈[-47,+59]mm · 8 of 35 slices shown]
[im 1/35]
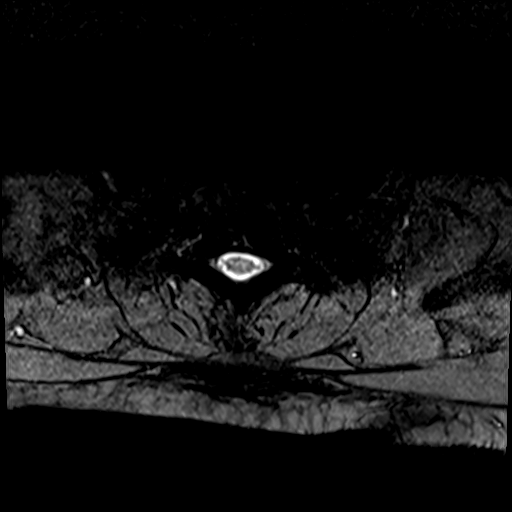
[im 6/35]
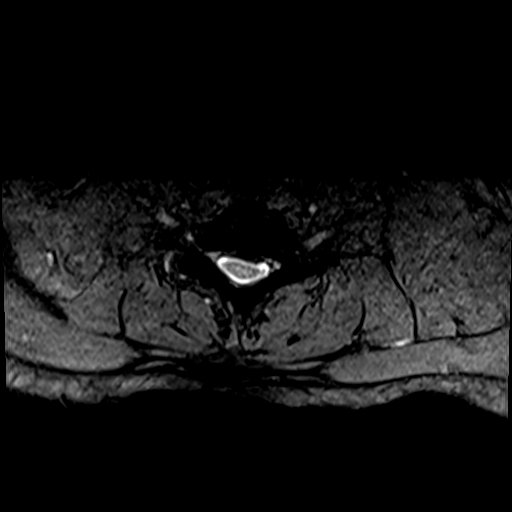
[im 11/35]
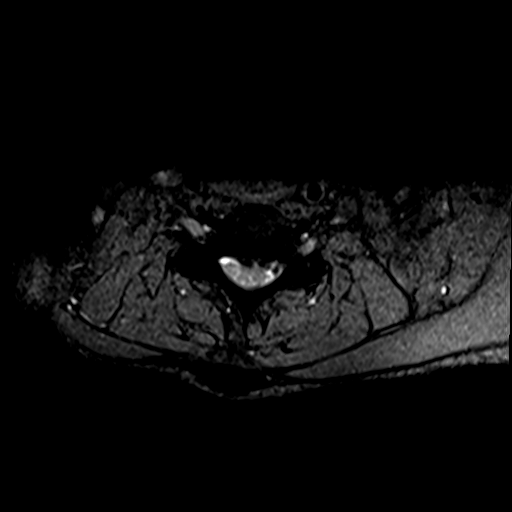
[im 16/35]
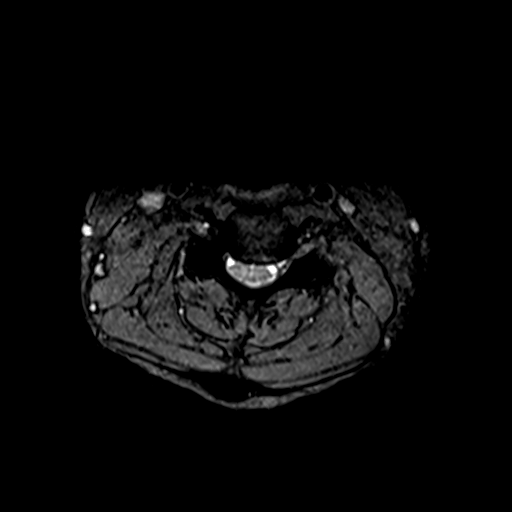
[im 19/35]
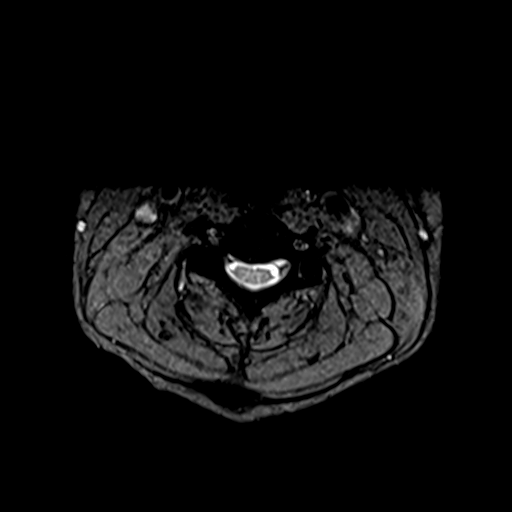
[im 24/35]
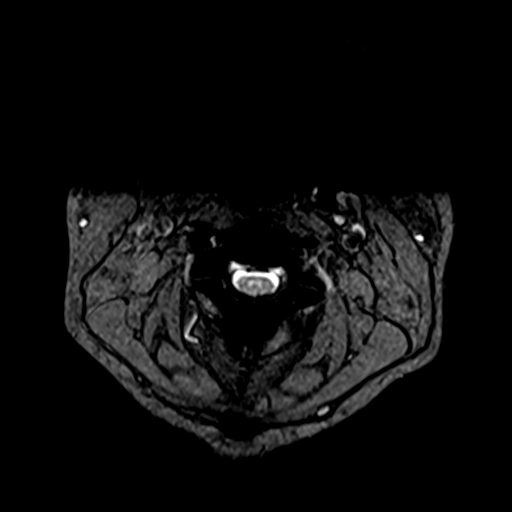
[im 29/35]
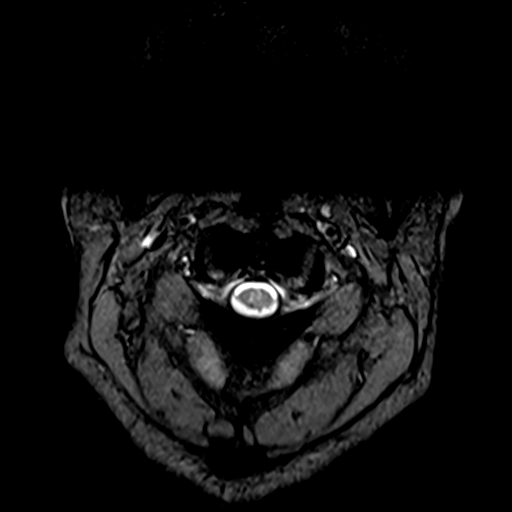
[im 35/35]
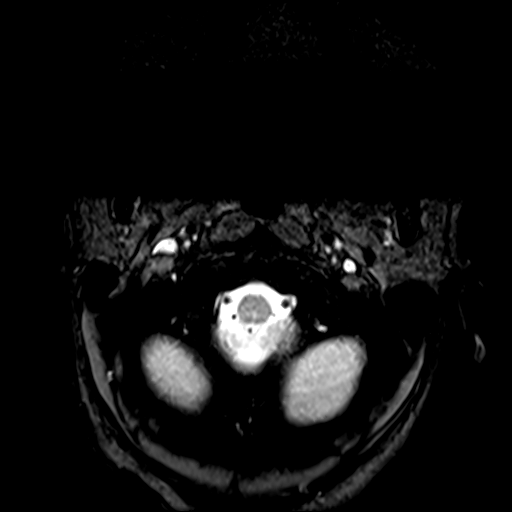

[39 of 48 positions shown; findings below may reference images not displayed]

FINDINGS: Alignment: Normal alignment.  Mild cervical kyphosis.

Vertebrae: Negative for fracture or mass. Degenerative fatty
endplate changes at C6-7. Small hemangioma T3 vertebral body.

Cord: Spinal stenosis and cord compression at C5-6 with mild cord
hyperintensity. Remaining cord signal is normal.

Posterior Fossa, vertebral arteries, paraspinal tissues: Negative

Disc levels:

C2-3: Negative

C3-4: Mild right foraminal narrowing due to spurring. Spinal canal
mildly narrowed.

C4-5: Shallow broad-based disc protrusion. Cord flattening and mild
spinal stenosis. Mild foraminal narrowing bilaterally. Mild facet
degeneration

C5-6: Moderate to large central disc protrusion. There is
compression of the cord and moderate to severe spinal stenosis.
Diffuse endplate spurring and moderate foraminal encroachment
bilaterally. Mild cord hyperintensity.

C6-7: Moderate disc degeneration with disc space narrowing and
spurring. Cord flattening with moderate spinal stenosis. Mild
foraminal stenosis bilaterally

C7-T1: Shallow central disc protrusion. Bilateral facet hypertrophy.
Moderate foraminal stenosis bilaterally.

The patient has a congenitally small spinal canal.
IMPRESSION: Congenitally small spinal canal. Multilevel degenerative change in
the cervical spine.

There is a moderate to large central disc protrusion at C5-6 with
cord compression and mild cord hyperintensity. Moderate to severe
spinal stenosis. Moderate foraminal encroachment bilaterally.

## 2019-11-21 IMAGING — MR MR THORACIC SPINE W/O CM
6 series · 37 of 48 positions shown · non-contrast
Comparison: None.

CLINICAL DATA: Motor neuron disease.  Lower extremity weakness.

EXAM:
MRI THORACIC SPINE WITHOUT CONTRAST
TECHNIQUE: Multiplanar, multisequence MR imaging of the thoracic spine was
performed. No intravenous contrast was administered.

[Series 16: T1 · sagittal · 4.0mm · 1.72mm/px · 2 of 5 slices shown (1 of 2)]
[im 1/5]
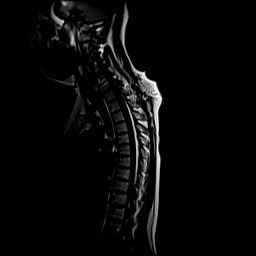
[im 5/5]
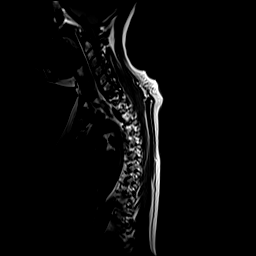

[Series 17: STIR · sagittal · 3.0mm · 1.00mm/px · 6 of 17 slices shown]
[im 1/17]
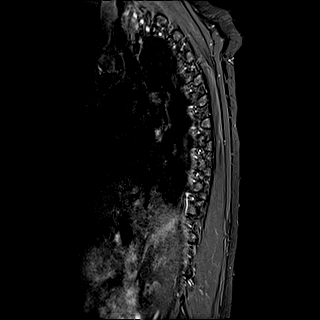
[im 4/17]
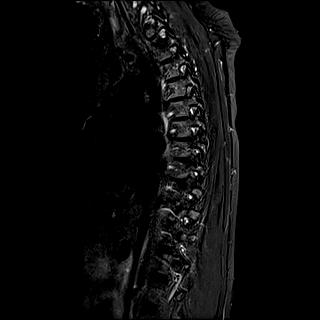
[im 7/17]
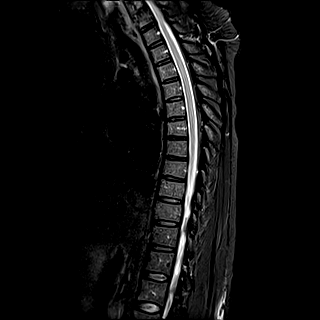
[im 10/17]
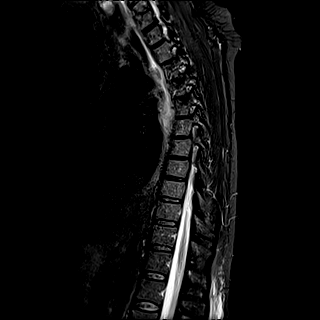
[im 13/17]
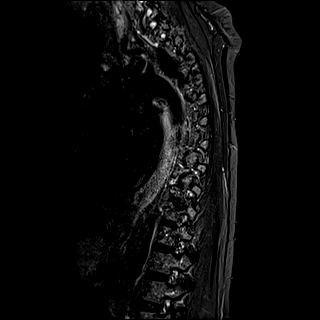
[im 17/17]
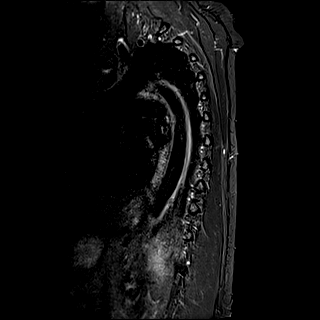

[Series 18: T1 · sagittal · 3.0mm · 1.00mm/px · 6 of 17 slices shown (2 of 2)]
[im 1/17]
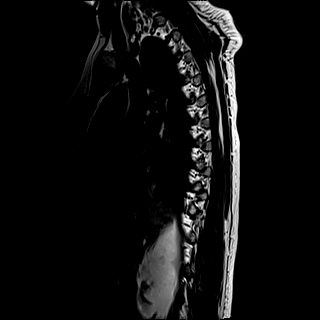
[im 4/17]
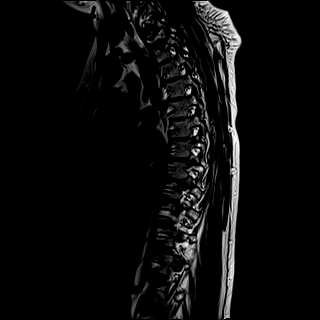
[im 7/17]
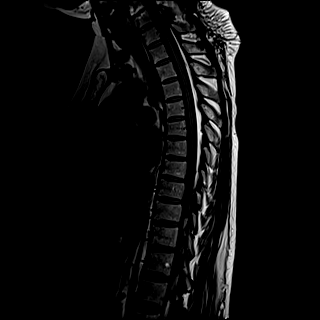
[im 10/17]
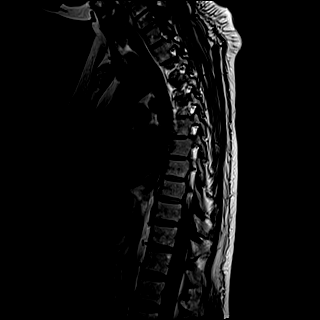
[im 13/17]
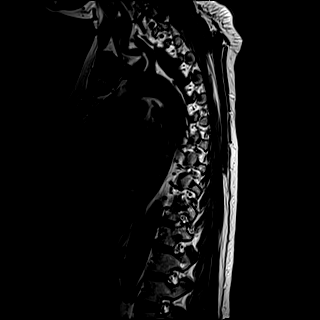
[im 17/17]
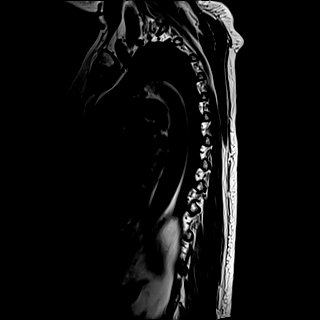

[Series 19: T2 · sagittal · 3.0mm · 0.83mm/px · 6 of 17 slices shown (1 of 2)]
[im 1/17]
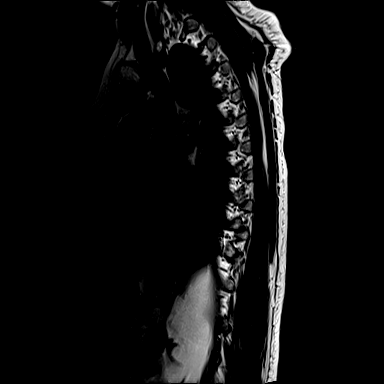
[im 4/17]
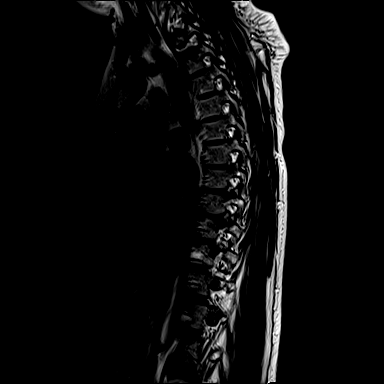
[im 7/17]
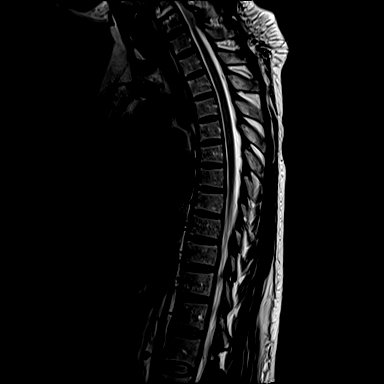
[im 10/17]
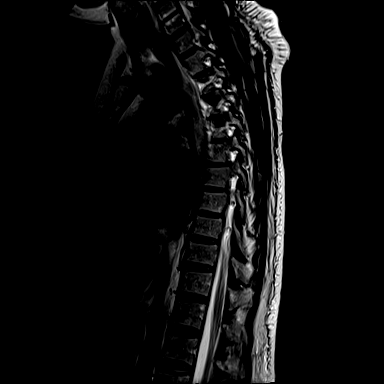
[im 13/17]
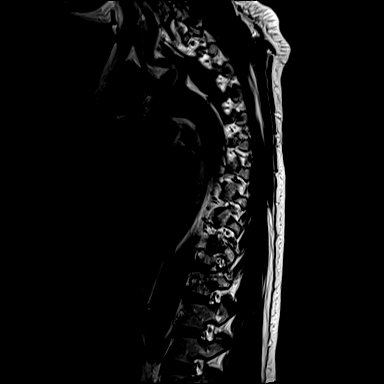
[im 17/17]
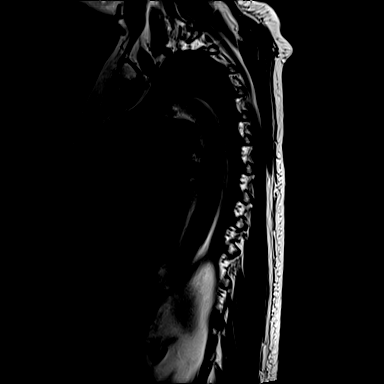

[Series 20: T2 · axial · 4.0mm · 0.78mm/px · z∈[-294,-84]mm · 9 of 39 slices shown (2 of 2)]
[im 1/39]
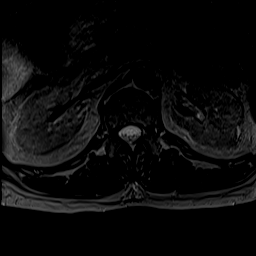
[im 3/39]
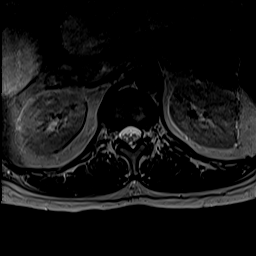
[im 6/39]
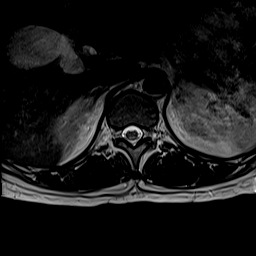
[im 12/39]
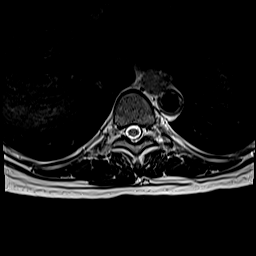
[im 18/39]
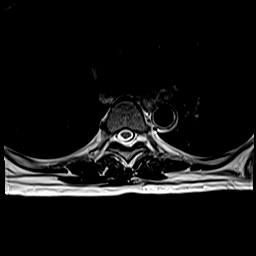
[im 21/39]
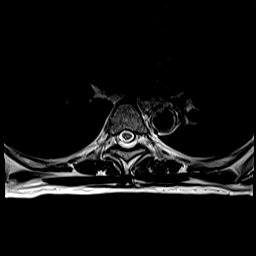
[im 27/39]
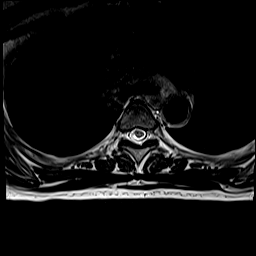
[im 33/39]
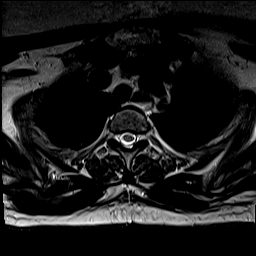
[im 39/39]
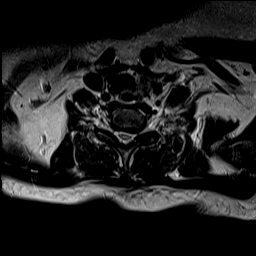

[Series 22: t2_me2d_tra · axial · 4.0mm · 0.39mm/px · z∈[-294,-84]mm · 8 of 39 slices shown]
[im 1/39]
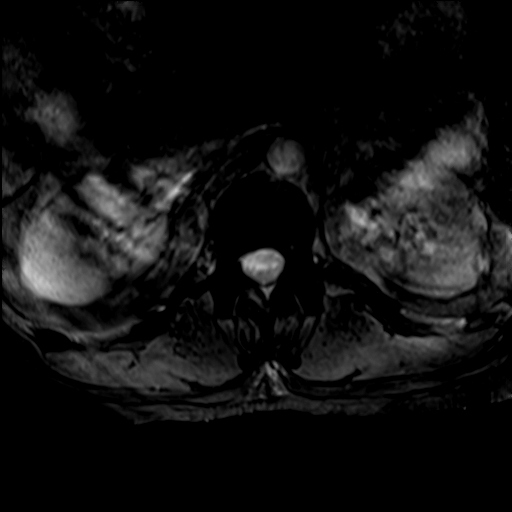
[im 6/39]
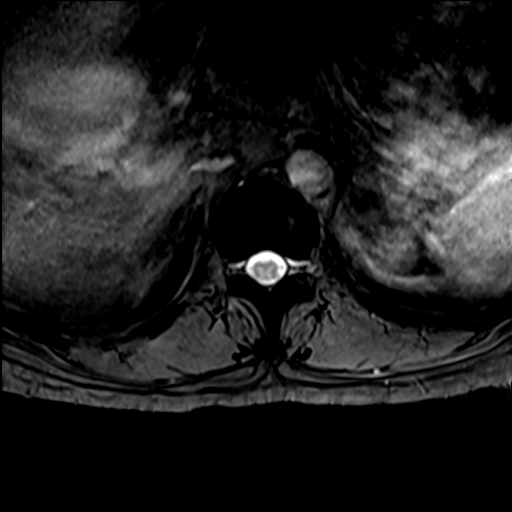
[im 12/39]
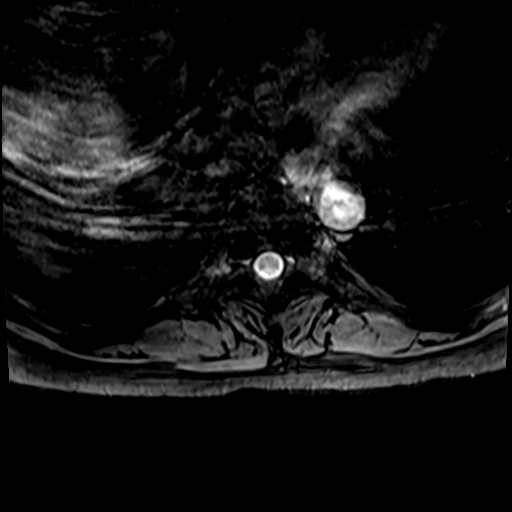
[im 18/39]
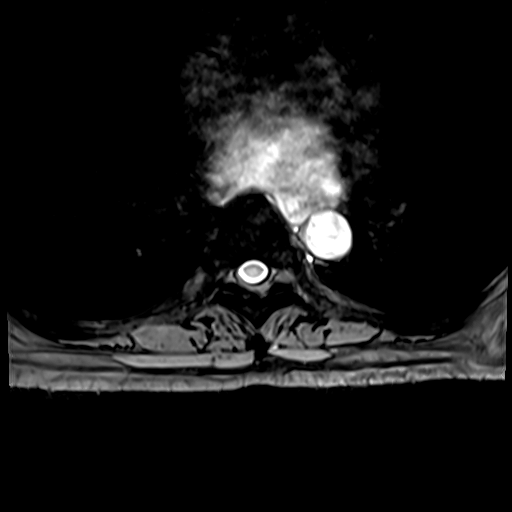
[im 21/39]
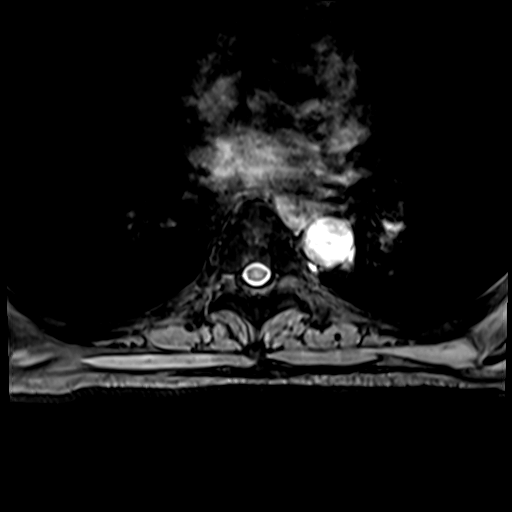
[im 27/39]
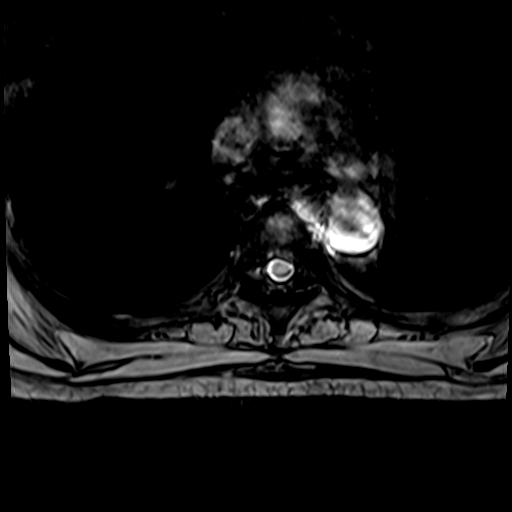
[im 33/39]
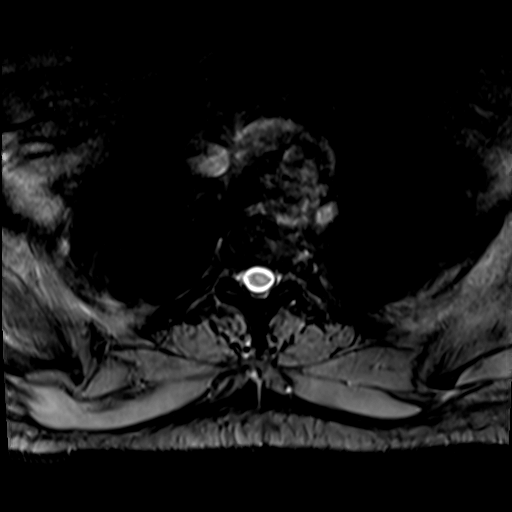
[im 39/39]
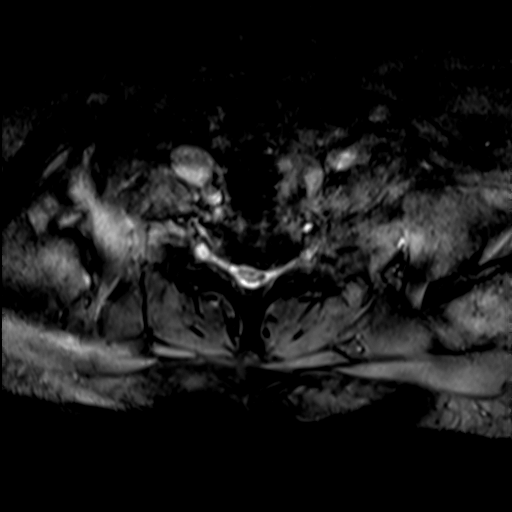

[37 of 48 positions shown; findings below may reference images not displayed]

FINDINGS: Alignment:  Normal

Vertebrae: Normal bone marrow. Negative for fracture or mass. Small
hemangioma T3 vertebral body.

Cord:  Normal signal and morphology

Paraspinal and other soft tissues: Negative

Disc levels:

Degenerative change in spurring at C6-7 and C7-T1. No significant
degenerative change in the thoracic disc spaces.

Negative for spinal or foraminal stenosis.
IMPRESSION: Essentially negative MRI thoracic spine.

## 2019-11-21 IMAGING — MR MR LUMBAR SPINE W/O CM
5 series · 31 of 48 positions shown · non-contrast
Comparison: None.

CLINICAL DATA: Motor neuron disease. Left leg weakness. Urinary
incontinence.

EXAM:
MRI LUMBAR SPINE WITHOUT CONTRAST
TECHNIQUE: Multiplanar, multisequence MR imaging of the lumbar spine was
performed. No intravenous contrast was administered.

[Series 9: T1 · sagittal · 4.0mm · 0.81mm/px · 6 of 15 slices shown (1 of 2)]
[im 1/15]
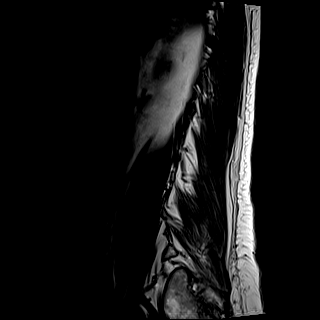
[im 3/15]
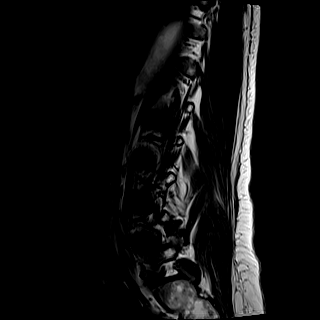
[im 6/15]
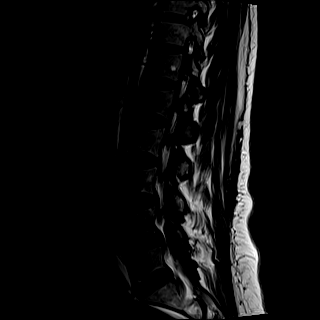
[im 9/15]
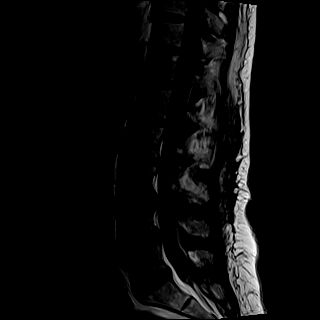
[im 12/15]
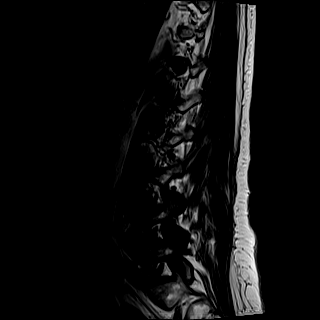
[im 15/15]
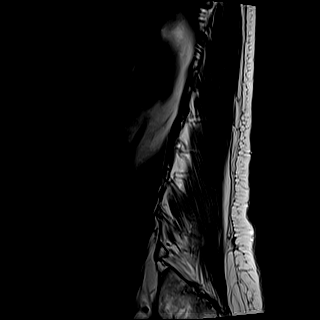

[Series 10: T2 · sagittal · 4.0mm · 0.81mm/px · 6 of 15 slices shown (1 of 2)]
[im 1/15]
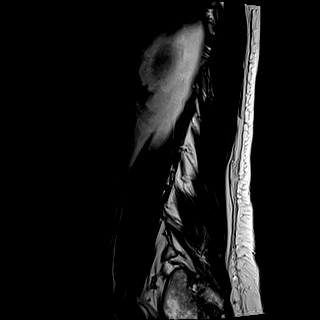
[im 3/15]
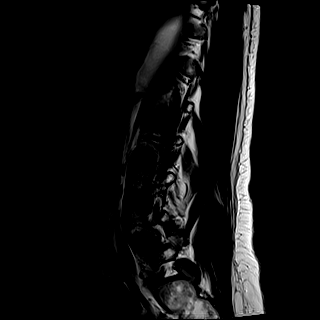
[im 6/15]
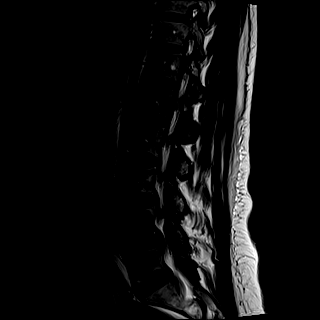
[im 9/15]
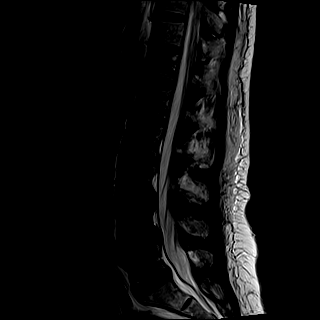
[im 12/15]
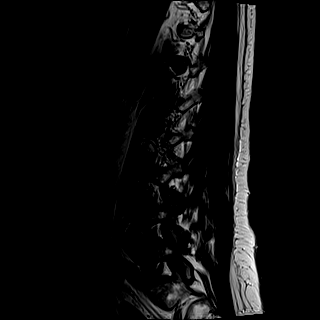
[im 15/15]
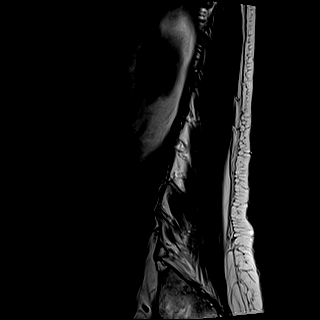

[Series 11: STIR · sagittal · 4.0mm · 0.51mm/px · 1 of 15 slices shown]
[im 1/15]
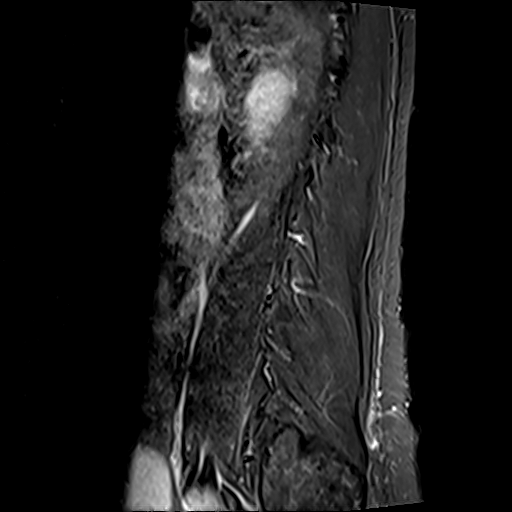

[Series 12: T2 · axial · 4.0mm · 0.62mm/px · z∈[-320,-130]mm · 9 of 39 slices shown (2 of 2)]
[im 1/39]
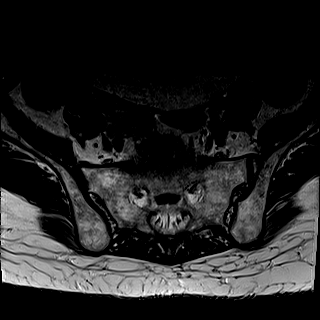
[im 6/39]
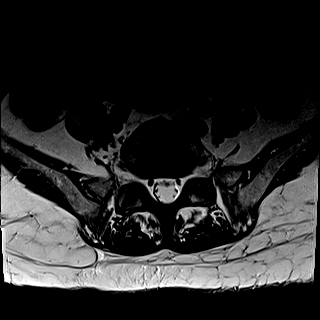
[im 11/39]
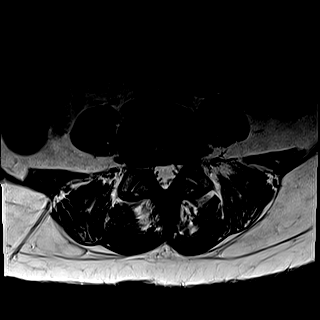
[im 17/39]
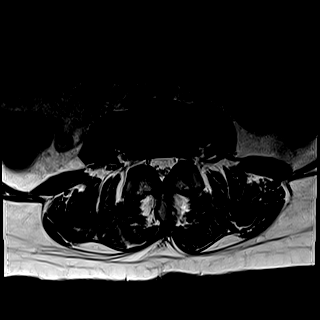
[im 20/39]
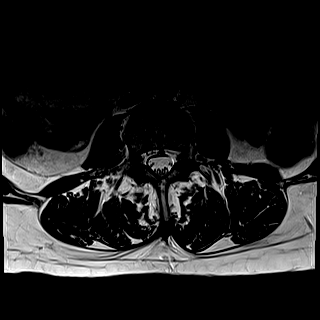
[im 22/39]
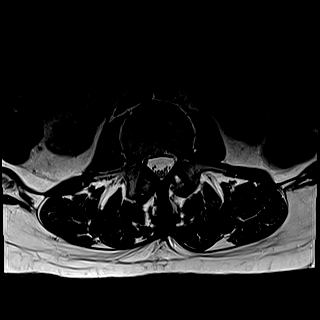
[im 28/39]
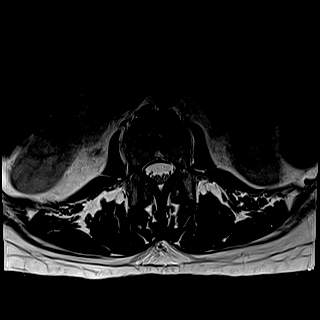
[im 33/39]
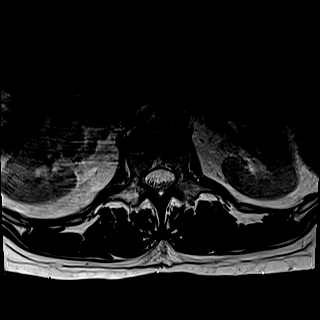
[im 39/39]
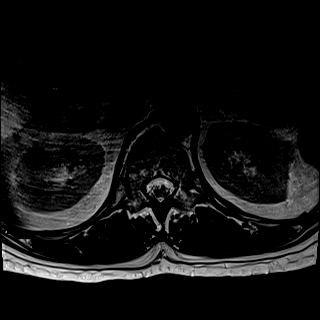

[Series 13: T1 · axial · 4.0mm · 0.43mm/px · z∈[-320,-130]mm · 9 of 39 slices shown (2 of 2)]
[im 1/39]
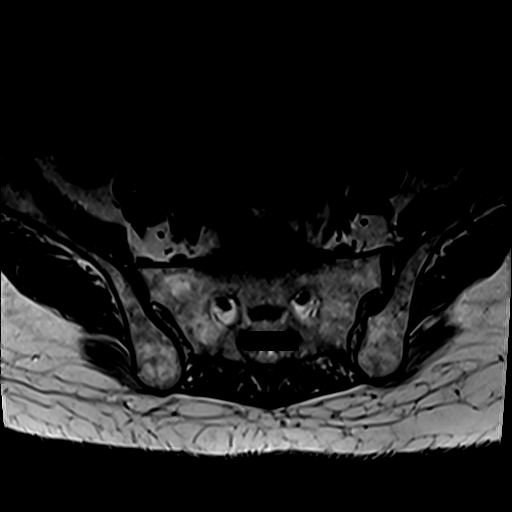
[im 6/39]
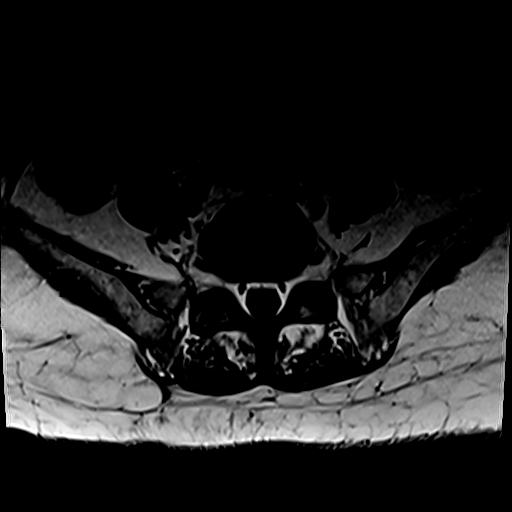
[im 11/39]
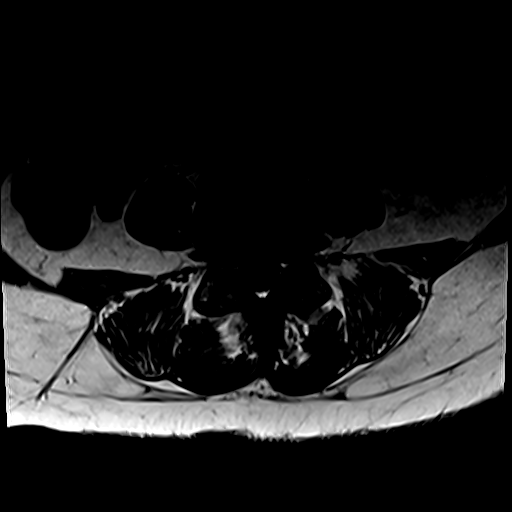
[im 17/39]
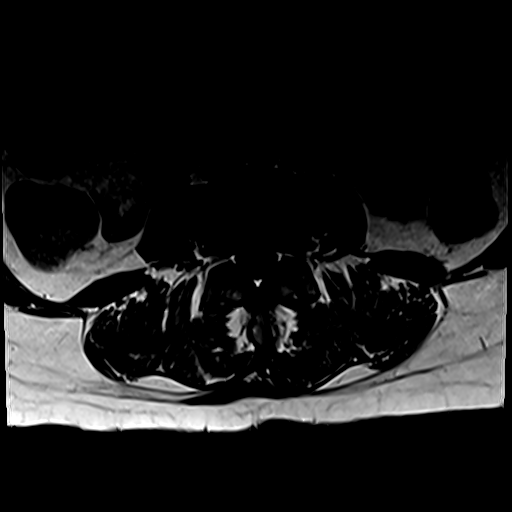
[im 20/39]
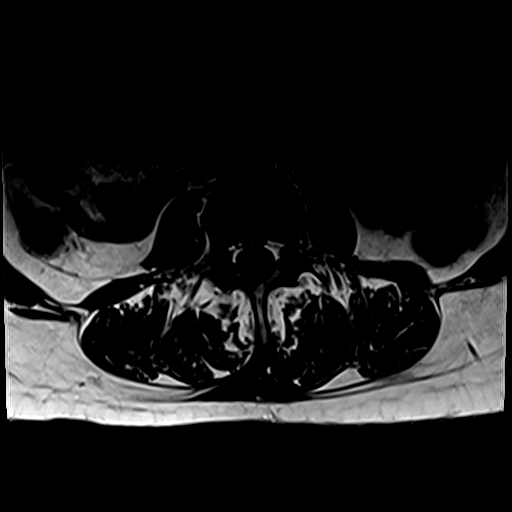
[im 22/39]
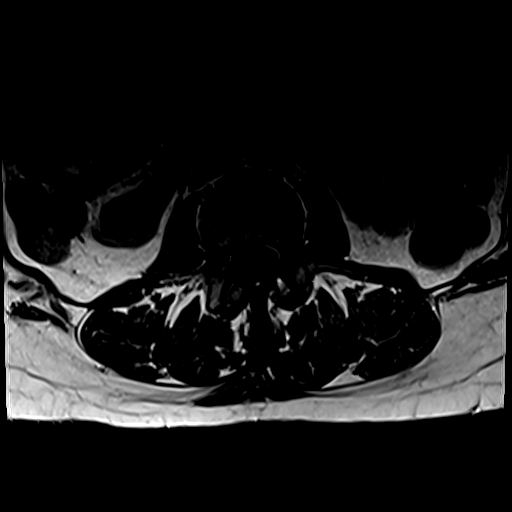
[im 28/39]
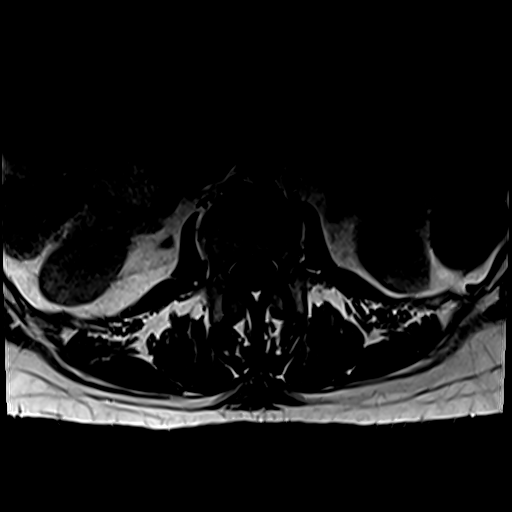
[im 33/39]
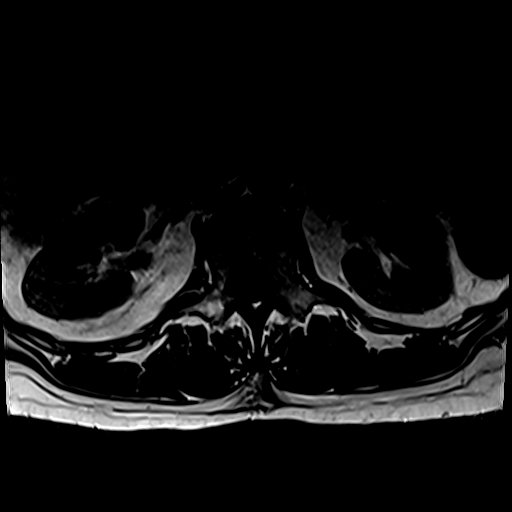
[im 39/39]
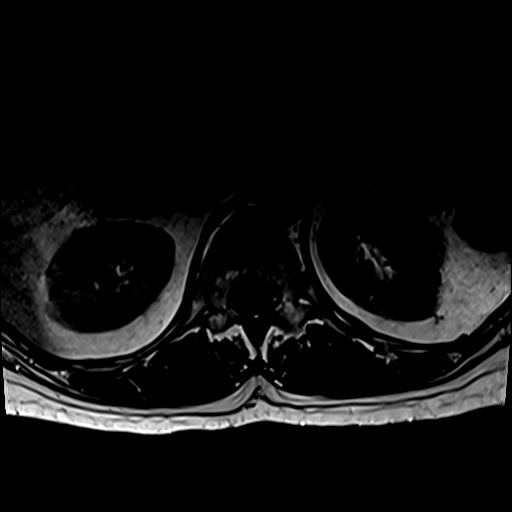

[31 of 48 positions shown; findings below may reference images not displayed]

FINDINGS: Segmentation:  Normal

Alignment:  Normal

Vertebrae:  Normal bone marrow.  Negative for fracture or mass.

Conus medullaris and cauda equina: Conus extends to the L1 level.
Conus and cauda equina appear normal.

Paraspinal and other soft tissues: Negative for paraspinous mass or
adenopathy. No soft tissue edema or fluid collection.

Disc levels:

L1-2: Negative

L2-3: Mild disc bulging and mild facet degeneration. Negative for
stenosis

L3-4: Mild disc bulging and mild facet degeneration. No significant
spinal or foraminal stenosis

L4-5: Mild disc degeneration and disc bulging. Shallow central disc
protrusion. Bilateral facet hypertrophy. Moderate subarticular
stenosis bilaterally.

L5-S1: Negative
IMPRESSION: Mild lumbar degenerative change. Mild subarticular stenosis
bilaterally L4-5

Normal conus medullaris

## 2019-11-21 IMAGING — MR MR HEAD W/O CM
10 series · 44 of 48 positions shown · non-contrast
Comparison: None.

CLINICAL DATA: Sudden onset lower extremity weakness with urinary
incontinence.

EXAM:
MRI HEAD WITHOUT CONTRAST
TECHNIQUE: Multiplanar, multiecho pulse sequences of the brain and surrounding
structures were obtained without intravenous contrast.

[Series 5: dwi_tracew · axial · 3.0mm · 1.08mm/px · z∈[-60,+88]mm · 8 of 102 slices shown]
[im 1/102]
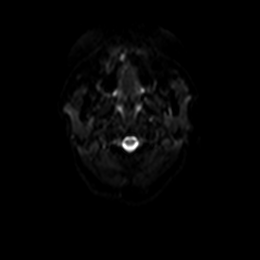
[im 19/102]
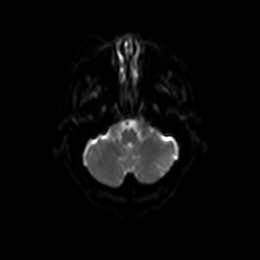
[im 28/102]
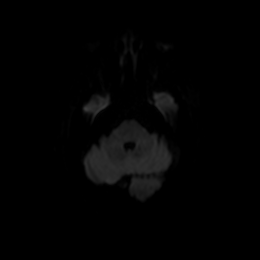
[im 46/102]
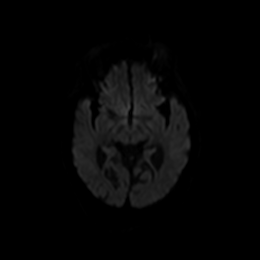
[im 56/102]
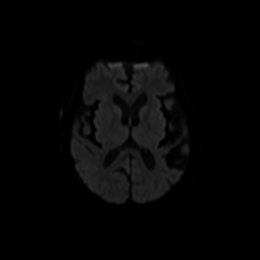
[im 74/102]
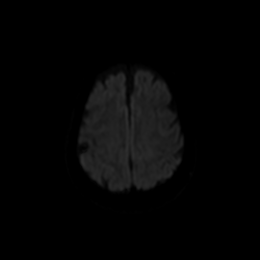
[im 83/102]
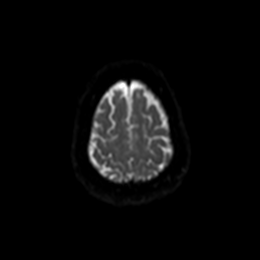
[im 102/102]
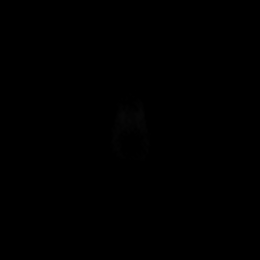

[Series 6: dwi_adc · axial · 3.0mm · 1.08mm/px · z∈[-60,+88]mm · 5 of 51 slices shown]
[im 1/51]
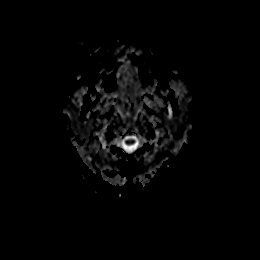
[im 13/51]
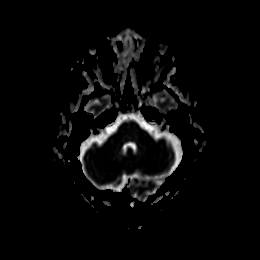
[im 26/51]
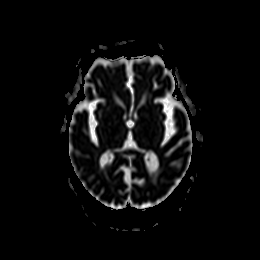
[im 38/51]
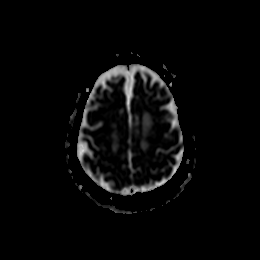
[im 51/51]
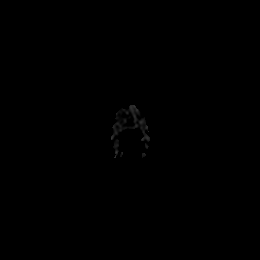

[Series 7: DWI · coronal · 5.0mm · 1.31mm/px · 6 of 56 slices shown (1 of 2)]
[im 1/56]
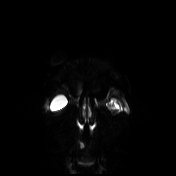
[im 12/56]
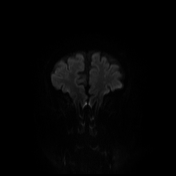
[im 23/56]
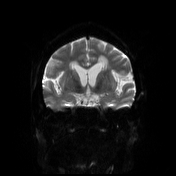
[im 34/56]
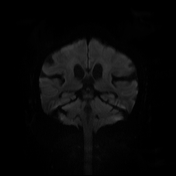
[im 45/56]
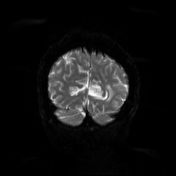
[im 56/56]
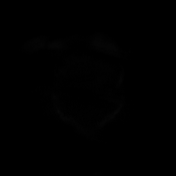

[Series 8: DWI · coronal · 5.0mm · 1.31mm/px · 3 of 28 slices shown (2 of 2)]
[im 1/28]
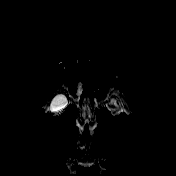
[im 14/28]
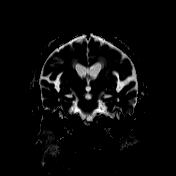
[im 28/28]
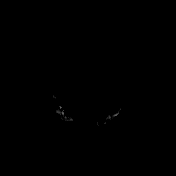

[Series 9: T2 · sagittal · 5.0mm · 0.47mm/px · 2 of 23 slices shown (1 of 3)]
[im 1/23]
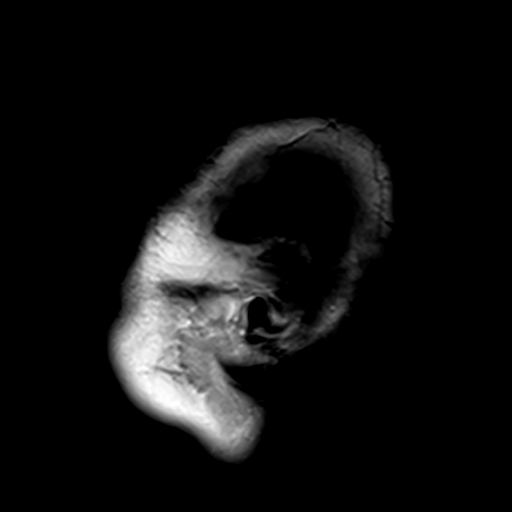
[im 23/23]
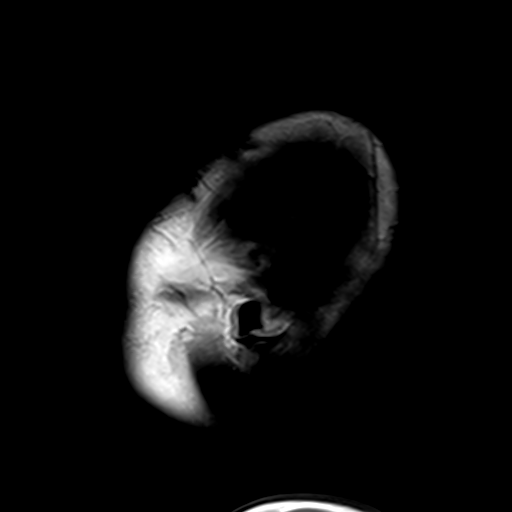

[Series 10: T2 · axial · 5.0mm · 0.45mm/px · z∈[-54,+93]mm · 2 of 24 slices shown (2 of 3)]
[im 1/24]
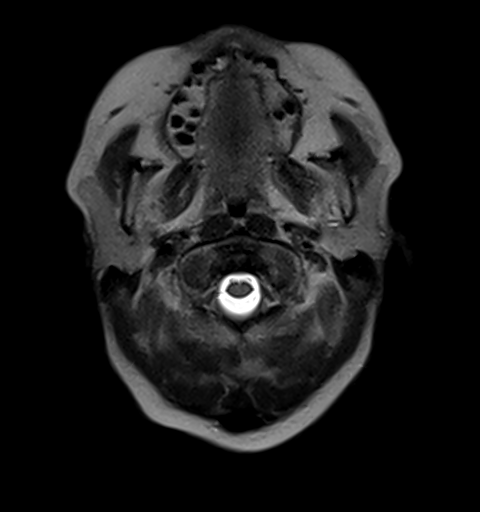
[im 24/24]
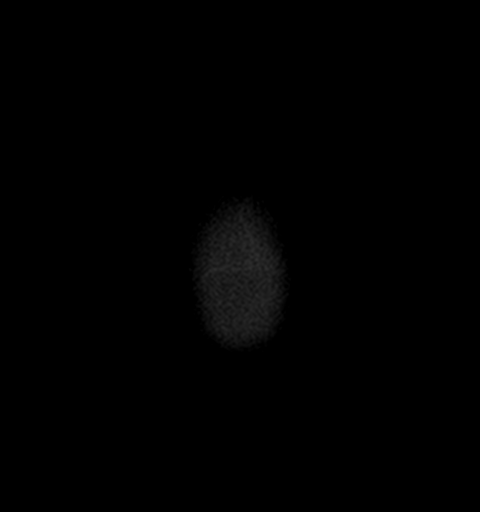

[Series 11: GRE · axial · 3.0mm · 0.45mm/px · z∈[-56,+92]mm · 5 of 51 slices shown]
[im 1/51]
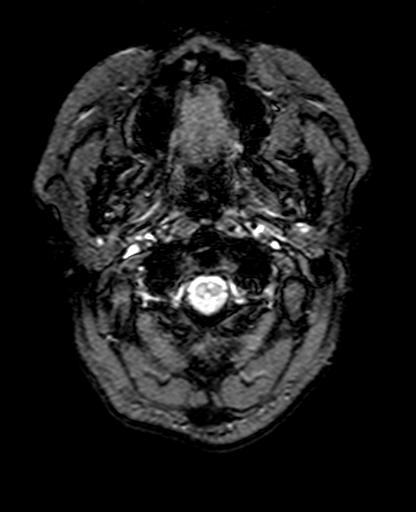
[im 13/51]
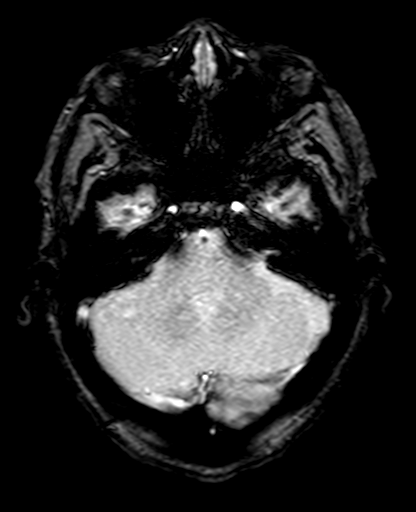
[im 26/51]
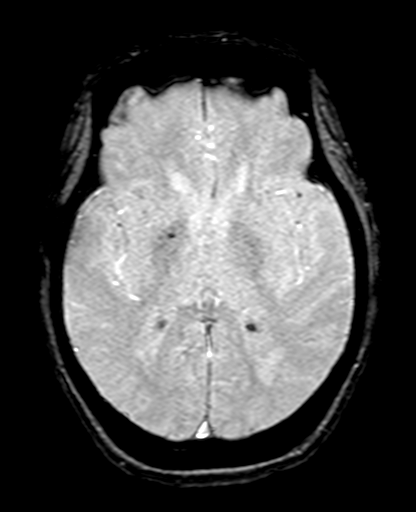
[im 38/51]
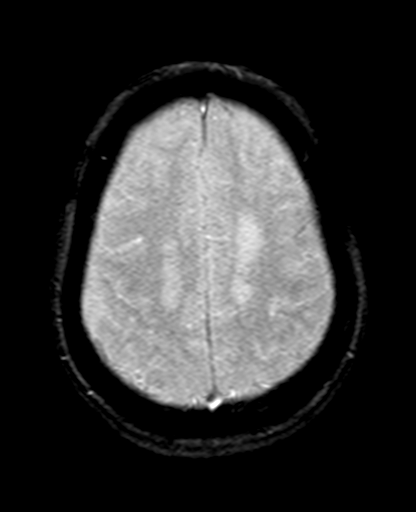
[im 51/51]
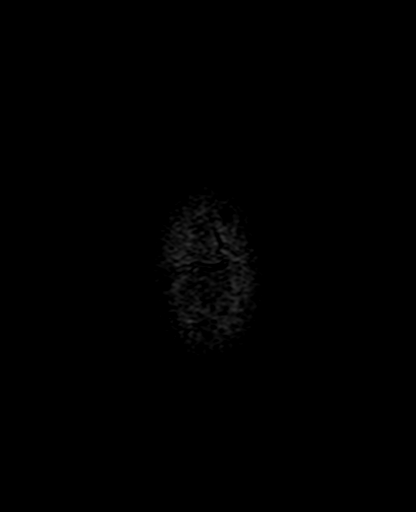

[Series 12: FLAIR · axial · 3.0mm · 0.86mm/px · z∈[-54,+94]mm · 5 of 51 slices shown]
[im 1/51]
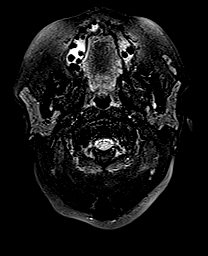
[im 13/51]
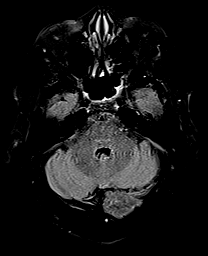
[im 26/51]
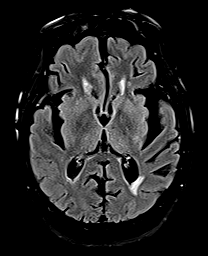
[im 38/51]
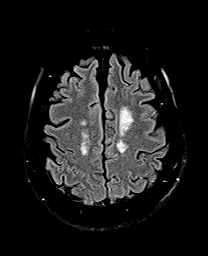
[im 51/51]
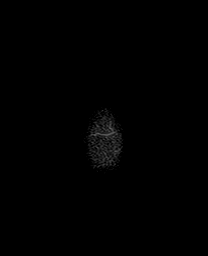

[Series 13: T1 · axial · 3.0mm · 0.45mm/px · z∈[-56,+92]mm · 5 of 51 slices shown]
[im 1/51]
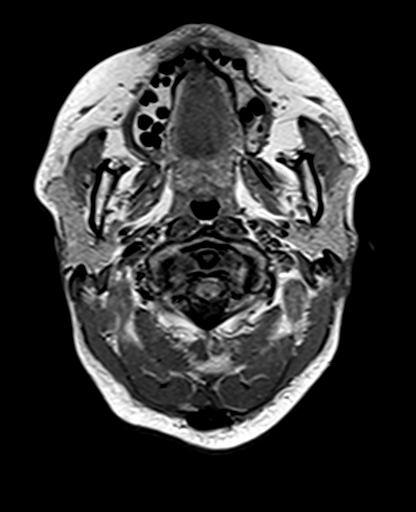
[im 13/51]
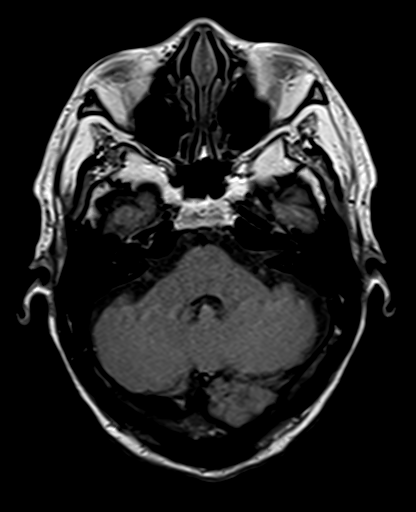
[im 26/51]
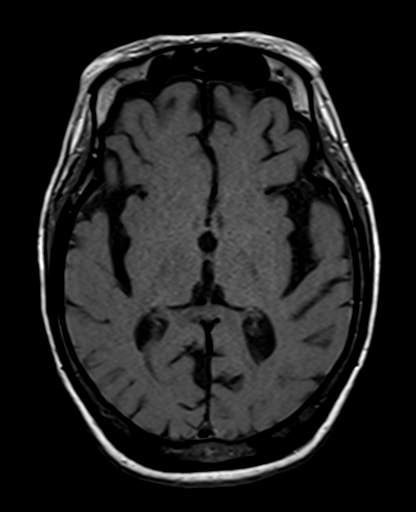
[im 38/51]
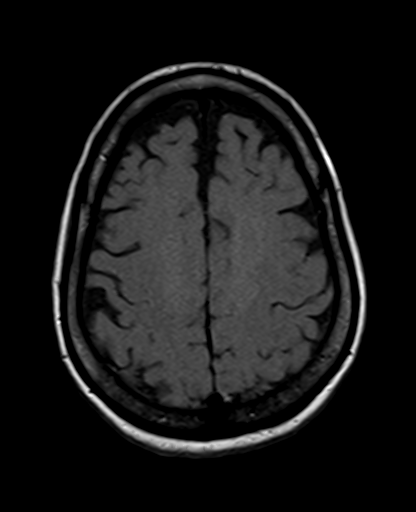
[im 51/51]
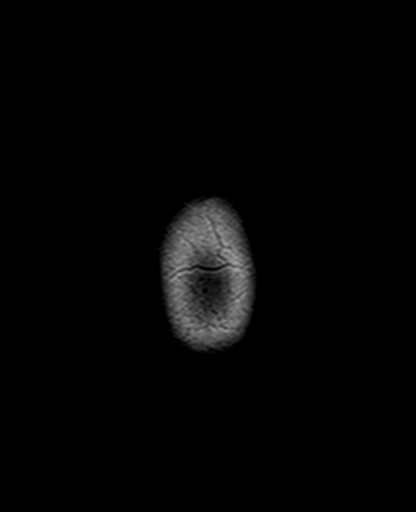

[Series 14: T2 · coronal · 5.0mm · 0.86mm/px · 3 of 28 slices shown (3 of 3)]
[im 1/28]
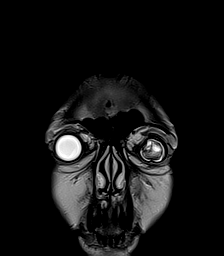
[im 14/28]
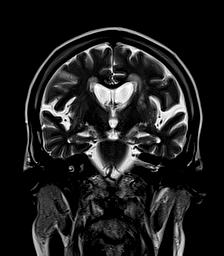
[im 28/28]
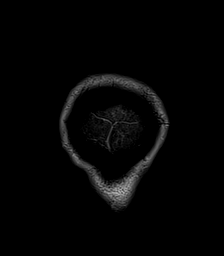

[44 of 48 positions shown; findings below may reference images not displayed]

FINDINGS: BRAIN: No acute infarct, acute hemorrhage or extra-axial collection.
Early confluent hyperintense T2-weighted signal of the
periventricular and deep white matter, most commonly due to chronic
ischemic microangiopathy. There is generalized atrophy without lobar
predilection. No chronic microhemorrhage. Normal midline structures.

VASCULAR: Major flow voids are preserved.

SKULL AND UPPER CERVICAL SPINE: Normal calvarium and skull base.
Visualized upper cervical spine and soft tissues are normal.

SINUSES/ORBITS: No paranasal sinus fluid levels or advanced mucosal
thickening. No mastoid or middle ear effusion. Chronic blood
products in the left globe.
IMPRESSION: 1. No acute intracranial process.
2. Generalized atrophy and findings of chronic ischemic
microangiopathy.

## 2019-11-23 ENCOUNTER — Other Ambulatory Visit: Payer: Self-pay | Admitting: Physician Assistant

## 2019-11-23 DIAGNOSIS — G9520 Unspecified cord compression: Secondary | ICD-10-CM

## 2019-11-23 DIAGNOSIS — R29898 Other symptoms and signs involving the musculoskeletal system: Secondary | ICD-10-CM

## 2019-12-12 ENCOUNTER — Encounter: Payer: Self-pay | Admitting: *Deleted

## 2019-12-14 ENCOUNTER — Ambulatory Visit: Payer: BC Managed Care – PPO | Admitting: Endocrinology

## 2019-12-21 NOTE — Progress Notes (Deleted)
NEUROLOGY CONSULTATION NOTE  Katherine Navarro MRN: [id number] DOB: [birth year]  Referring provider: Inda Coke, PA Primary care provider: Inda Coke, PA  Reason for consult:  Impaired lower extremity mobility  HISTORY OF PRESENT ILLNESS: Katherine Navarro is a 64 year old ***-handed female with type 1 diabetes mellitus and hyperthyroidism who presents for impaired lower extremity mobility.  History supplemented by referring provider's note.  In February, patient noted fairly onset of bilateral lower extremity weakness.  ***.  She exhibited episodes of urinary incontinence.  Over the next few weeks, she had some improvement in strength, now able to ambulate, but weakness has not completely resolved.  She has uncontrolled type 1 diabetes mellitus with Hgb A1c of 11.6 in April.  Other labs included B12 525, non-reactive RPR, CK 87, TSH 0.87 and vitamin D 31.  She underwent MRI of the neuroaxis on 11/21/2019 which was personally reviewed.  Brain showexd generalized atrophy and chronic small vessel ischemic changes but no acute intracranial abnormality, normal thoracic spine and mild degenerative changes with mild bilateral subarticular stenosis at L4-5 but otherwise unremarkable.  However, cervical spine.  Cervical spine demonstrated central disc protrusion at C5-6 causing moderate to severe spinal stenosis with cord compression and mild cord hyperintensity.  ***  PAST MEDICAL HISTORY: Past Medical History:  Diagnosis Date  . CONSTIPATION, CHRONIC 12/13/2007  . DIABETES MELLITUS, TYPE I 01/07/2007  . DM nephropathy/sclerosis   . GLAUCOMA 12/13/2007  . HYPERCHOLESTEROLEMIA 12/13/2007  . Hyperthyroidism   . INTERNAL HEMORRHOIDS 07/23/2008  . LEUKOPENIA, CHRONIC 12/13/2007  . Nonproliferative diabetic retinopathy IZT(245.80) 12/13/2007  . Unspecified hypothyroidism 12/13/2007  . VARICOSE VEINS, LOWER EXTREMITIES 12/13/2007    PAST SURGICAL HISTORY: Past Surgical History:  Procedure  Laterality Date  . ELECTROCARDIOGRAM  08/24/2006  . EYE SURGERY     bilateral  . LEEP N/A 12/01/2012   Procedure: LOOP ELECTROSURGICAL EXCISION PROCEDURE (LEEP);  Surgeon: Delice Lesch, MD;  Location: [facility];  Service: Gynecology;  Laterality: N/A;  . Stress Myoview   05/18/2004  . TUBAL LIGATION      MEDICATIONS: Current Outpatient Medications on File Prior to Visit  Medication Sig Dispense Refill  . alum & mag hydroxide-simeth (MAALOX PLUS) 400-400-40 MG/5ML suspension Take 10 mLs by mouth every 6 (six) hours as needed for indigestion. 355 mL 0  . amiloride-hydrochlorothiazide (MODURETIC) 5-50 MG tablet Take 0.5 tablets by mouth daily. 45 tablet 2  . Cod Liver Oil CAPS Take 1 capsule by mouth daily.      . dorzolamide-timolol (COSOPT) 22.3-6.8 MG/ML ophthalmic solution 1 drop 2 (two) times daily.    . fluticasone (FLONASE) 50 MCG/ACT nasal spray INSTILL 2 SPRAYS IN BOTH NOSTRILS DAILY 16 g 11  . Glucosamine 500 MG TABS Take 1 tablet by mouth daily.      Marland Kitchen glucose blood (ONETOUCH ULTRA) test strip 1 each by Other route 4 (four) times daily. And lancets 4/day 360 each 3  . hydrocortisone-pramoxine (ANALPRAM-HC) 2.5-1 % rectal cream Place rectally as needed for hemorrhoids. Apply to rectum as needed 30 g 1  . Insulin Infusion Pump Supplies (MINIMED INFUSION SET-MMT 396) MISC 1 Device by Other route every 3 (three) days. 30 each 3  . Insulin Infusion Pump Supplies (PARADIGM RESERVOIR 3ML) MISC 1 Device by Other route every 3 (three) days. 30 each 3  . insulin lispro (HUMALOG) 100 UNIT/ML injection Inject 0.5 mLs (50 Units total) into the skin See admin instructions. 50 UNITS DAILY VIA PUMP 40 mL 3  .  levothyroxine (SYNTHROID) 112 MCG tablet Take 1 tablet (112 mcg total) by mouth daily before breakfast. 90 tablet 3  . magnesium oxide (MAG-OX) 400 MG tablet Take 2 tablets once daily     . ondansetron (ZOFRAN ODT) 4 MG disintegrating tablet Take 1 tablet (4 mg total) by mouth every 8 (eight)  hours as needed for nausea or vomiting. 20 tablet 0  . vitamin E 400 UNIT capsule Take 2 capsules by mouth twice a day as needed      No current facility-administered medications on file prior to visit.    ALLERGIES: Allergies  Allergen Reactions  . Atorvastatin     REACTION: perceived myalgias  . Ciprofloxacin     REACTION: pt states med  make her sick  . Epinephrine     REACTION: thyroid problems  . Erythromycin   . Morphine   . Penicillins     REACTION: hives    FAMILY HISTORY: Family History  Problem Relation Age of Onset  . Cancer Mother        Breast Cancer  . Diabetes Mother   . Breast cancer Mother   . Diabetes Father     SOCIAL HISTORY: Social History   Socioeconomic History  . Marital status: Married    Spouse name: Not on file  . Number of children: Not on file  . Years of education: Not on file  . Highest education level: Not on file  Occupational History    Comment: Doesn't work outside the home  Tobacco Use  . Smoking status: Never Smoker  . Smokeless tobacco: Never Used  Substance and Sexual Activity  . Alcohol use: No  . Drug use: No  . Sexual activity: Not on file  Other Topics Concern  . Not on file  Social History Narrative   Pt gets regular exercise.         Social Determinants of Health   Financial Resource Strain:   . Difficulty of Paying Living Expenses:   Food Insecurity:   . Worried About Charity fundraiser in the Last Year:   . Arboriculturist in the Last Year:   Transportation Needs:   . Film/video editor (Medical):   Marland Kitchen Lack of Transportation (Non-Medical):   Physical Activity:   . Days of Exercise per Week:   . Minutes of Exercise per Session:   Stress:   . Feeling of Stress :   Social Connections:   . Frequency of Communication with Friends and Family:   . Frequency of Social Gatherings with Friends and Family:   . Attends Religious Services:   . Active Member of Clubs or Organizations:   . Attends Theatre manager Meetings:   Marland Kitchen Marital Status:   Intimate Partner Violence:   . Fear of Current or Ex-Partner:   . Emotionally Abused:   Marland Kitchen Physically Abused:   . Sexually Abused:     PHYSICAL EXAM: *** General: No acute distress.  Patient appears well-groomed.   Head:  Normocephalic/atraumatic Eyes:  fundi examined but not visualized Neck: supple, no paraspinal tenderness, full range of motion Back: No paraspinal tenderness Heart: regular rate and rhythm Lungs: Clear to auscultation bilaterally. Vascular: No carotid bruits. Neurological Exam: Mental status: alert and oriented to person, place, and time, recent and remote memory intact, fund of knowledge intact, attention and concentration intact, speech fluent and not dysarthric, language intact. Cranial nerves: CN I: not tested CN II: pupils equal, round and reactive to light, visual  fields intact CN III, IV, VI:  full range of motion, no nystagmus, no ptosis CN V: facial sensation intact CN VII: upper and lower face symmetric CN VIII: hearing intact CN IX, X: gag intact, uvula midline CN XI: sternocleidomastoid and trapezius muscles intact CN XII: tongue midline Bulk & Tone: normal, no fasciculations. Motor:  5/5 throughout *** Sensation:  Pinprick *** temperature *** and vibration sensation intact.  ***. Deep Tendon Reflexes:  2+ throughout, *** toes downgoing.  *** Finger to nose testing:  Without dysmetria.   Heel to shin:  Without dysmetria.   Gait:  Normal station and stride.  Able to turn and tandem walk. Romberg ***.  IMPRESSION: ***  PLAN: ***  Thank you for allowing me to take part in the care of this patient.  Metta Clines, DO  CC: ***

## 2019-12-25 ENCOUNTER — Ambulatory Visit: Payer: BC Managed Care – PPO | Admitting: Neurology

## 2019-12-27 ENCOUNTER — Telehealth: Payer: Self-pay

## 2019-12-27 NOTE — Telephone Encounter (Signed)
Looks like [facility] called the pt and left a voicemail on 6/7 to have her scheduled. Referral is still active - will let pt know to give their office a call.

## 2019-12-27 NOTE — Telephone Encounter (Signed)
Katherine Navarro pt and made her aware that office called and lvm for her to schedule appt. Gave pt office number. Pt will call office to schedule appt.

## 2019-12-27 NOTE — Telephone Encounter (Signed)
Katherine Navarro, please look into this. Urgent referral placed on 11/23/2019.

## 2019-12-27 NOTE — Telephone Encounter (Signed)
Urgent neurosurgery referral to [facility] was placed on 11/23/19... I'm not sure why she hasn't already been evaluated by them. Please look into this and let me know what's going on.  Katherine Navarro

## 2019-12-27 NOTE — Telephone Encounter (Signed)
Please see message and advise 

## 2019-12-27 NOTE — Telephone Encounter (Signed)
Pt called stating that she saw the neuroligst who stated that he couldn't do anything for her and recommended she get a referral to a Chief of Staff. Pt would like a referral

## 2019-12-28 ENCOUNTER — Ambulatory Visit: Payer: BC Managed Care – PPO | Admitting: Neurology

## 2019-12-30 NOTE — Telephone Encounter (Signed)
Can we call patient to ask her if she scheduled an appointment as instructed?

## 2019-12-31 NOTE — Telephone Encounter (Signed)
Left message on voicemail to call office.  

## 2020-01-04 DIAGNOSIS — G959 Disease of spinal cord, unspecified: Secondary | ICD-10-CM | POA: Diagnosis not present

## 2020-01-04 NOTE — Telephone Encounter (Signed)
Spoke to pt asked her if she scheduled to see Neurosurgeon? Pt said she has an appt today. Told her okay good.

## 2020-01-25 ENCOUNTER — Other Ambulatory Visit: Payer: Self-pay | Admitting: Neurosurgery

## 2020-02-04 ENCOUNTER — Encounter (HOSPITAL_COMMUNITY): Payer: Self-pay

## 2020-02-04 ENCOUNTER — Other Ambulatory Visit (HOSPITAL_COMMUNITY)
Admission: RE | Admit: 2020-02-04 | Discharge: 2020-02-04 | Disposition: A | Discharge status: Home / Self Care | Payer: BC Managed Care – PPO | Source: Ambulatory Visit | Attending: Neurosurgery | Admitting: Neurosurgery

## 2020-02-04 ENCOUNTER — Encounter (HOSPITAL_COMMUNITY)
Admission: RE | Admit: 2020-02-04 | Discharge: 2020-02-04 | Disposition: A | Discharge status: Home / Self Care | Payer: BC Managed Care – PPO | Source: Ambulatory Visit | Attending: Neurosurgery | Admitting: Neurosurgery

## 2020-02-04 ENCOUNTER — Other Ambulatory Visit: Payer: Self-pay

## 2020-02-04 DIAGNOSIS — Z20822 Contact with and (suspected) exposure to covid-19: Secondary | ICD-10-CM | POA: Insufficient documentation

## 2020-02-04 DIAGNOSIS — Z01812 Encounter for preprocedural laboratory examination: Secondary | ICD-10-CM | POA: Insufficient documentation

## 2020-02-04 HISTORY — DX: Essential (primary) hypertension: I10

## 2020-02-04 HISTORY — DX: Other complications of anesthesia, initial encounter: T88.59XA

## 2020-02-04 LAB — BASIC METABOLIC PANEL WITH GFR
Anion gap: 10 (ref 5–15)
BUN: 21 mg/dL (ref 8–23)
CO2: 25 mmol/L (ref 22–32)
Calcium: 10.5 mg/dL — ABNORMAL HIGH (ref 8.9–10.3)
Chloride: 98 mmol/L (ref 98–111)
Creatinine, Ser: 1.09 mg/dL — ABNORMAL HIGH (ref 0.44–1.00)
GFR calc Af Amer: >60 mL/min (ref >60)
GFR calc non Af Amer: 54 mL/min — ABNORMAL LOW (ref >60)
Glucose, Bld: 305 mg/dL — ABNORMAL HIGH (ref 70–99)
Potassium: 4 mmol/L (ref 3.5–5.1)
Sodium: 133 mmol/L — ABNORMAL LOW (ref 135–145)

## 2020-02-04 LAB — CBC
HCT: 43.5 % (ref 36.0–46.0)
Hemoglobin: 13.7 g/dL (ref 12.0–15.0)
MCH: 26.4 pg (ref 26.0–34.0)
MCHC: 31.5 g/dL (ref 30.0–36.0)
MCV: 84 fL (ref 80.0–100.0)
Platelets: 284 10*3/uL (ref 150–400)
RBC: 5.18 MIL/uL — ABNORMAL HIGH (ref 3.87–5.11)
RDW: 13.2 % (ref 11.5–15.5)
WBC: 3.6 10*3/uL — ABNORMAL LOW (ref 4.0–10.5)
nRBC: 0 % (ref 0.0–0.2)

## 2020-02-04 LAB — HEMOGLOBIN A1C
Hgb A1c MFr Bld: 11.4 % — ABNORMAL HIGH (ref 4.8–5.6)
Mean Plasma Glucose: 280.48 mg/dL

## 2020-02-04 LAB — TYPE AND SCREEN
ABO/RH(D): AB POS
Antibody Screen: NEGATIVE

## 2020-02-04 LAB — SARS CORONAVIRUS 2 (TAT 6-24 HRS): SARS Coronavirus 2: NEGATIVE

## 2020-02-04 LAB — GLUCOSE, CAPILLARY: Glucose-Capillary: 246 mg/dL — ABNORMAL HIGH (ref 70–99)

## 2020-02-04 LAB — SURGICAL PCR SCREEN
MRSA, PCR: NEGATIVE
Staphylococcus aureus: NEGATIVE

## 2020-02-04 NOTE — Pre-Procedure Instructions (Signed)
[facility], [place] [address] [place] [address] Phone: [phone] Fax: [phone]  [place], [place] - [place] [address] Phone: [phone] Fax: [phone]      Your procedure is scheduled on Wednesday August 18th.  Report to [facility] Main Entrance "A" at 11:30 A.M., and check in at the Admitting office.  Call this number if you have problems the morning of surgery:  [phone]  Call [phone] if you have any questions prior to your surgery date Monday-Friday 8am-4pm    Remember:  Do not eat or drink after midnight the night before your surgery   Take these medicines the morning of surgery with A SIP OF WATER   dorzolamide-timolol (COSOPT) 22.3-6.8 MG/ML ophthalmic solution  levothyroxine (SYNTHROID) 112 MCG tablet    If needed:  brimonidine (ALPHAGAN P) 0.1 % SOLN as needed for dry eyes  ondansetron (ZOFRAN ODT) 4 MG disintegrating tablet if needed for nausea   WHAT DO I DO ABOUT MY DIABETES MEDICATION?    Do not take oral diabetes medicines (pills) the morning of surgery.   Please try first to contact your PCP or endocrinologist who manages your pump for instruction on your insulin pump OR reduce all basal rates by 20% at midnight     On the day of surgery, do not take other diabetes injectables, including Byetta (exenatide), Bydureon (exenatide ER), Victoza (liraglutide), or Trulicity (dulaglutide).   If your CBG is greater than 220 mg/dL, you may take  of your sliding scale (correction) dose of insulin.   HOW TO MANAGE YOUR DIABETES BEFORE AND AFTER SURGERY  Why is it important to control my blood sugar before and after surgery?  Improving blood sugar levels before and after surgery helps healing and can limit problems.  A way of improving blood sugar control is eating a healthy diet by: o  Eating  less sugar and carbohydrates o  Increasing activity/exercise o  Talking with your doctor about reaching your blood sugar goals  High blood sugars (greater than 180 mg/dL) can raise your risk of infections and slow your recovery, so you will need to focus on controlling your diabetes during the weeks before surgery.  Make sure that the doctor who takes care of your diabetes knows about your planned surgery including the date and location.  How do I manage my blood sugar before surgery?  Check your blood sugar at least 4 times a day, starting 2 days before surgery, to make sure that the level is not too high or low.  Check your blood sugar the morning of your surgery when you wake up and every 2 hours until you get to the Short Stay unit. o If your blood sugar is less than 70 mg/dL, you will need to treat for low blood sugar: - Do not take insulin. - Treat a low blood sugar (less than 70 mg/dL) with  cup of clear juice (cranberry or apple), 4 glucose tablets, OR glucose gel. - Recheck blood sugar in 15 minutes after treatment (to make sure it is greater than 70 mg/dL). If your blood sugar is not greater than 70 mg/dL on recheck, call [phone] for further instructions.  Report your blood sugar to the short stay nurse when you get to Short Stay.   If you are admitted to the hospital after surgery: o Your blood sugar will be checked  by the staff and you will probably be given insulin after surgery (instead of oral diabetes medicines) to make sure you have good blood sugar levels. o The goal for blood sugar control after surgery is 80-180 mg/dL.    As of today, STOP taking any Aspirin (unless otherwise instructed by your surgeon) Aleve, Naproxen, Ibuprofen, Motrin, Advil, Goody's, BC's, all herbal medications, fish oil, and all vitamins.                      Do not wear jewelry, make up, or nail polish            Do not wear lotions, powders, perfumes, or deodorant.            Do not  shave 48 hours prior to surgery.             Do not bring valuables to the hospital.            [facility] is not responsible for any belongings or valuables.  Do NOT Smoke (Tobacco/Vaping) or drink Alcohol 24 hours prior to your procedure If you use a CPAP at night, you may bring all equipment for your overnight stay.   Contacts, glasses, dentures or bridgework may not be worn into surgery.      For patients admitted to the hospital, discharge time will be determined by your treatment team.   Patients discharged the day of surgery will not be allowed to drive home, and someone needs to stay with them for 24 hours.    Special instructions:   Katherine Navarro- Preparing For Surgery  Before surgery, you can play an important role. Because skin is not sterile, your skin needs to be as free of germs as possible. You can reduce the number of germs on your skin by washing with CHG (chlorahexidine gluconate) Soap before surgery.  CHG is an antiseptic cleaner which kills germs and bonds with the skin to continue killing germs even after washing.    Oral Hygiene is also important to reduce your risk of infection.  Remember - BRUSH YOUR TEETH THE MORNING OF SURGERY WITH YOUR REGULAR TOOTHPASTE  Please do not use if you have an allergy to CHG or antibacterial soaps. If your skin becomes reddened/irritated stop using the CHG.  Do not shave (including legs and underarms) for at least 48 hours prior to first CHG shower. It is OK to shave your face.  Please follow these instructions carefully.   1. Shower the NIGHT BEFORE SURGERY and the MORNING OF SURGERY with CHG Soap.   2. If you chose to wash your hair, wash your hair first as usual with your normal shampoo.  3. After you shampoo, rinse your hair and body thoroughly to remove the shampoo.  4. Use CHG as you would any other liquid soap. You can apply CHG directly to the skin and wash gently with a scrungie or a clean washcloth.   5. Apply the CHG  Soap to your body ONLY FROM THE NECK DOWN.  Do not use on open wounds or open sores. Avoid contact with your eyes, ears, mouth and genitals (private parts). Wash Face and genitals (private parts)  with your normal soap.   6. Wash thoroughly, paying special attention to the area where your surgery will be performed.  7. Thoroughly rinse your body with warm water from the neck down.  8. DO NOT shower/wash with your normal soap after using and rinsing off the CHG  Soap.  9. Pat yourself dry with a CLEAN TOWEL.  10. Wear CLEAN PAJAMAS to bed the night before surgery  11. Place CLEAN SHEETS on your bed the night of your first shower and DO NOT SLEEP WITH PETS.   Day of Surgery: Wear Clean/Comfortable clothing the morning of surgery Do not apply any deodorants/lotions.   Remember to brush your teeth WITH YOUR REGULAR TOOTHPASTE.   Please read over the following fact sheets that you were given.

## 2020-02-04 NOTE — Progress Notes (Signed)
PCP - Len Blalock Cardiologist - Denies  Chest x-ray - Denies EKG - 08/02/19 Stress Test - Yes cant recall date ECHO - 05/04/12 Cardiac Cath - Denies  Sleep Study - Denies OSA  DM Type I Fasting Blood Sugar - 190's-270's  Checks Blood Sugar __several___ times a day  COVID TEST- 02/04/20   Anesthesia review: No   Patient denies shortness of breath, fever, cough and chest pain at PAT appointment   All instructions explained to the patient, with a verbal understanding of the material. Patient agrees to go over the instructions while at home for a better understanding. Patient also instructed to self quarantine after being tested for COVID-19. The opportunity to ask questions was provided.

## 2020-02-05 NOTE — Anesthesia Preprocedure Evaluation (Addendum)
Anesthesia Evaluation  Patient identified by MRN, date of birth, ID band Patient awake    Reviewed: Allergy & Precautions, NPO status , Patient's Chart, lab work & pertinent test results  Airway Mallampati: II  TM Distance: >3 FB Neck ROM: Full    Dental  (+) Teeth Intact, Dental Advisory Given   Pulmonary    breath sounds clear to auscultation       Cardiovascular hypertension,  Rhythm:Regular Rate:Normal     Neuro/Psych    GI/Hepatic   Endo/Other  diabetes  Renal/GU      Musculoskeletal   Abdominal   Peds  Hematology   Anesthesia Other Findings   Reproductive/Obstetrics                            Anesthesia Physical Anesthesia Plan  ASA: III  Anesthesia Plan: General   Post-op Pain Management:    Induction: Intravenous  PONV Risk Score and Plan: Ondansetron  Airway Management Planned: Oral ETT  Additional Equipment:   Intra-op Plan:   Post-operative Plan:   Informed Consent: I have reviewed the patients History and Physical, chart, labs and discussed the procedure including the risks, benefits and alternatives for the proposed anesthesia with the patient or authorized representative who has indicated his/her understanding and acceptance.     Dental advisory given  Plan Discussed with: CRNA and Anesthesiologist  Anesthesia Plan Comments: (PAT note by Karoline Caldwell, PA-C: Type 1 diabetes, on insulin pump with uncontrolled, A1c 11.4, blood glucose 305. PAT nurse consulted diabetic coordinator. Result called to Dr. Windy Carina office.   Remainder of preop labs unremarkable.   EKG 08/02/19: Normal sinus rhythm. Rate 69. Low voltage QRS. Cannot rule out Anterior infarct , age undetermined. No significant change since last tracing   Plan to use endo-tool intraoperatively. Will transition to SubQ insulin in PACU with DM coordeinator's help.  Roberts Gaudy CT C-spine  11/21/19: IMPRESSION: Congenitally small spinal canal. Multilevel degenerative change in the cervical spine.  There is a moderate to large central disc protrusion at C5-6 with cord compression and mild cord hyperintensity. Moderate to severe spinal stenosis. Moderate foraminal encroachment bilaterally.  TTE 05/04/12: Study Conclusions   Left ventricle: The cavity size was normal. Wall thickness  was normal. Systolic function was normal. The estimated  ejection fraction was in the range of 60% to 65%. Wall  motion was normal; there were no regional wall motion  Abnormalities.  )      Anesthesia Quick Evaluation

## 2020-02-05 NOTE — Progress Notes (Signed)
Anesthesia Chart Review:  Type 1 diabetes, on insulin pump with uncontrolled, A1c 11.4, blood glucose 305. PAT nurse consulted diabetic coordinator. Result called to Dr. Windy Carina office.   Remainder of preop labs unremarkable.   EKG 08/02/19: Normal sinus rhythm. Rate 69. Low voltage QRS. Cannot rule out Anterior infarct , age undetermined. No significant change since last tracing  CT C-spine 11/21/19: IMPRESSION: Congenitally small spinal canal. Multilevel degenerative change in the cervical spine.  There is a moderate to large central disc protrusion at C5-6 with cord compression and mild cord hyperintensity. Moderate to severe spinal stenosis. Moderate foraminal encroachment bilaterally.  TTE 05/04/12: Study Conclusions   Left ventricle: The cavity size was normal. Wall thickness  was normal. Systolic function was normal. The estimated  ejection fraction was in the range of 60% to 65%. Wall  motion was normal; there were no regional wall motion  Abnormalities.   Katherine Navarro [facility] Short Stay Center/Anesthesiology Phone [phone] 02/05/2020 11:34 AM

## 2020-02-06 ENCOUNTER — Ambulatory Visit (HOSPITAL_COMMUNITY): Payer: BC Managed Care – PPO

## 2020-02-06 ENCOUNTER — Ambulatory Visit (HOSPITAL_COMMUNITY): Payer: BC Managed Care – PPO | Admitting: Physician Assistant

## 2020-02-06 ENCOUNTER — Other Ambulatory Visit: Payer: Self-pay

## 2020-02-06 ENCOUNTER — Observation Stay (HOSPITAL_COMMUNITY)
Admission: RE | Admit: 2020-02-06 | Discharge: 2020-02-08 | Disposition: A | Discharge status: Home / Self Care | Payer: BC Managed Care – PPO | Attending: Neurosurgery | Admitting: Neurosurgery

## 2020-02-06 ENCOUNTER — Encounter (HOSPITAL_COMMUNITY)
Admission: RE | Disposition: A | Discharge status: Home / Self Care | Payer: Self-pay | Source: Home / Self Care | Attending: Neurosurgery

## 2020-02-06 ENCOUNTER — Ambulatory Visit (HOSPITAL_COMMUNITY): Payer: BC Managed Care – PPO | Admitting: Certified Registered"

## 2020-02-06 ENCOUNTER — Encounter (HOSPITAL_COMMUNITY): Payer: Self-pay | Admitting: Neurosurgery

## 2020-02-06 DIAGNOSIS — R2689 Other abnormalities of gait and mobility: Secondary | ICD-10-CM | POA: Diagnosis not present

## 2020-02-06 DIAGNOSIS — M4802 Spinal stenosis, cervical region: Secondary | ICD-10-CM | POA: Diagnosis not present

## 2020-02-06 DIAGNOSIS — Z419 Encounter for procedure for purposes other than remedying health state, unspecified: Secondary | ICD-10-CM

## 2020-02-06 DIAGNOSIS — I10 Essential (primary) hypertension: Secondary | ICD-10-CM | POA: Insufficient documentation

## 2020-02-06 DIAGNOSIS — E039 Hypothyroidism, unspecified: Secondary | ICD-10-CM | POA: Insufficient documentation

## 2020-02-06 DIAGNOSIS — Z79899 Other long term (current) drug therapy: Secondary | ICD-10-CM | POA: Insufficient documentation

## 2020-02-06 DIAGNOSIS — R531 Weakness: Secondary | ICD-10-CM | POA: Diagnosis not present

## 2020-02-06 DIAGNOSIS — M4712 Other spondylosis with myelopathy, cervical region: Secondary | ICD-10-CM | POA: Diagnosis not present

## 2020-02-06 DIAGNOSIS — M50022 Cervical disc disorder at C5-C6 level with myelopathy: Secondary | ICD-10-CM | POA: Diagnosis not present

## 2020-02-06 DIAGNOSIS — M5020 Other cervical disc displacement, unspecified cervical region: Secondary | ICD-10-CM | POA: Diagnosis present

## 2020-02-06 DIAGNOSIS — R200 Anesthesia of skin: Secondary | ICD-10-CM | POA: Insufficient documentation

## 2020-02-06 DIAGNOSIS — Z794 Long term (current) use of insulin: Secondary | ICD-10-CM | POA: Insufficient documentation

## 2020-02-06 DIAGNOSIS — E109 Type 1 diabetes mellitus without complications: Secondary | ICD-10-CM | POA: Insufficient documentation

## 2020-02-06 HISTORY — PX: ANTERIOR CERVICAL DECOMP/DISCECTOMY FUSION: SHX1161

## 2020-02-06 LAB — GLUCOSE, CAPILLARY
Glucose-Capillary: 188 mg/dL — ABNORMAL HIGH (ref 70–99)
Glucose-Capillary: 192 mg/dL — ABNORMAL HIGH (ref 70–99)
Glucose-Capillary: 205 mg/dL — ABNORMAL HIGH (ref 70–99)
Glucose-Capillary: 181 mg/dL — ABNORMAL HIGH (ref 70–99)
Glucose-Capillary: 134 mg/dL — ABNORMAL HIGH (ref 70–99)
Glucose-Capillary: 252 mg/dL — ABNORMAL HIGH (ref 70–99)

## 2020-02-06 LAB — ABO/RH: ABO/RH(D): AB POS

## 2020-02-06 IMAGING — RF DG CERVICAL SPINE 1V
1 series · 2 of 2 positions shown · non-contrast
Comparison: [DATE]

CLINICAL DATA: C5-6 ACDF

EXAM:
DG CERVICAL SPINE - 1 VIEW; DG C-ARM 1-60 MIN

[Series 1: run · 2 of 2 slices shown]
[im 1/2]
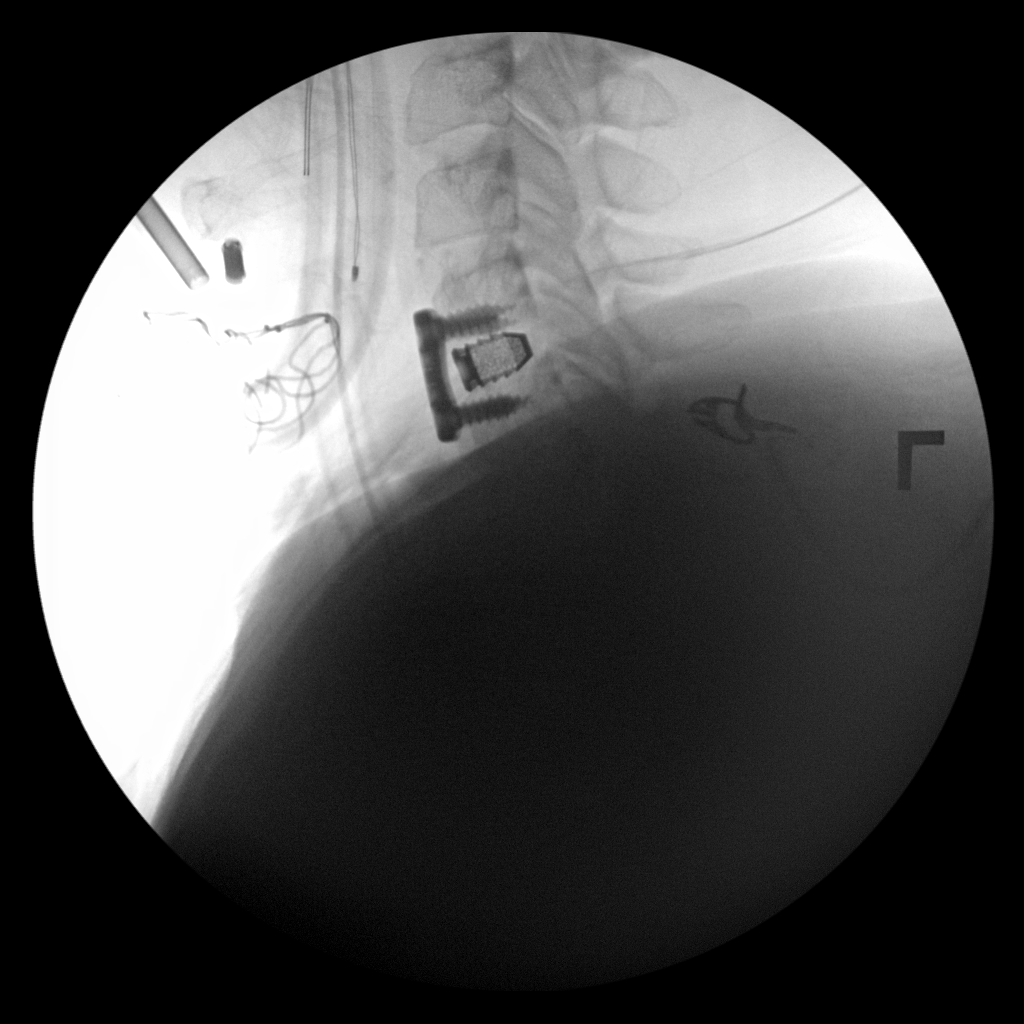
[im 2/2]
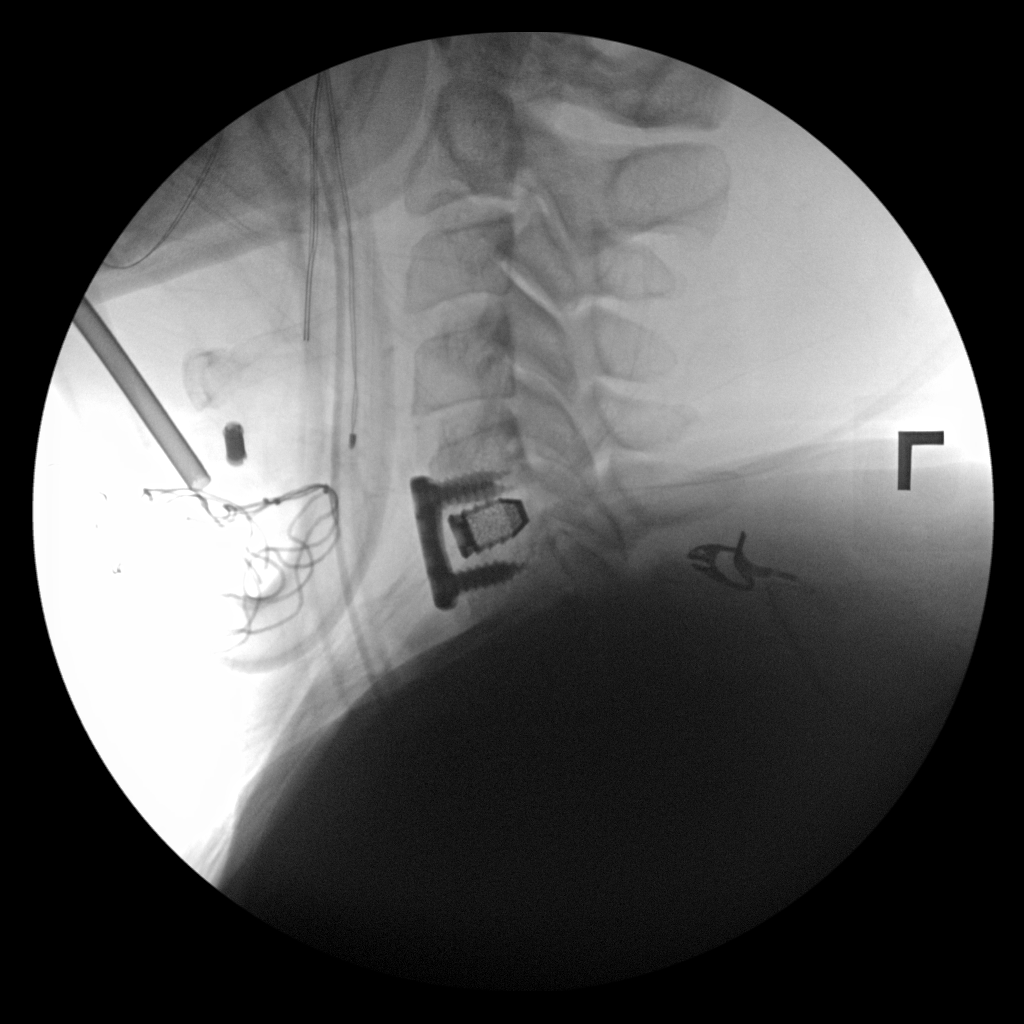

[2 of 2 positions shown; findings below may reference images not displayed]

FINDINGS: Two fluoroscopic images are obtained during the performance of the
procedure and are provided for interpretation only. Patient is
intubated. Postsurgical changes are seen from ACDF at C5/C6, with
anatomic alignment. Please refer to the operative report.

FLUOROSCOPY TIME:  9 seconds
IMPRESSION: 1. C5/C6 ACDF as above.

## 2020-02-06 IMAGING — RF DG C-ARM 1-60 MIN
1 series · 2 of 2 positions shown · non-contrast
Comparison: [DATE]

CLINICAL DATA: C5-6 ACDF

EXAM:
DG CERVICAL SPINE - 1 VIEW; DG C-ARM 1-60 MIN

[Series 1: run · 2 of 2 slices shown]
[im 1/2]
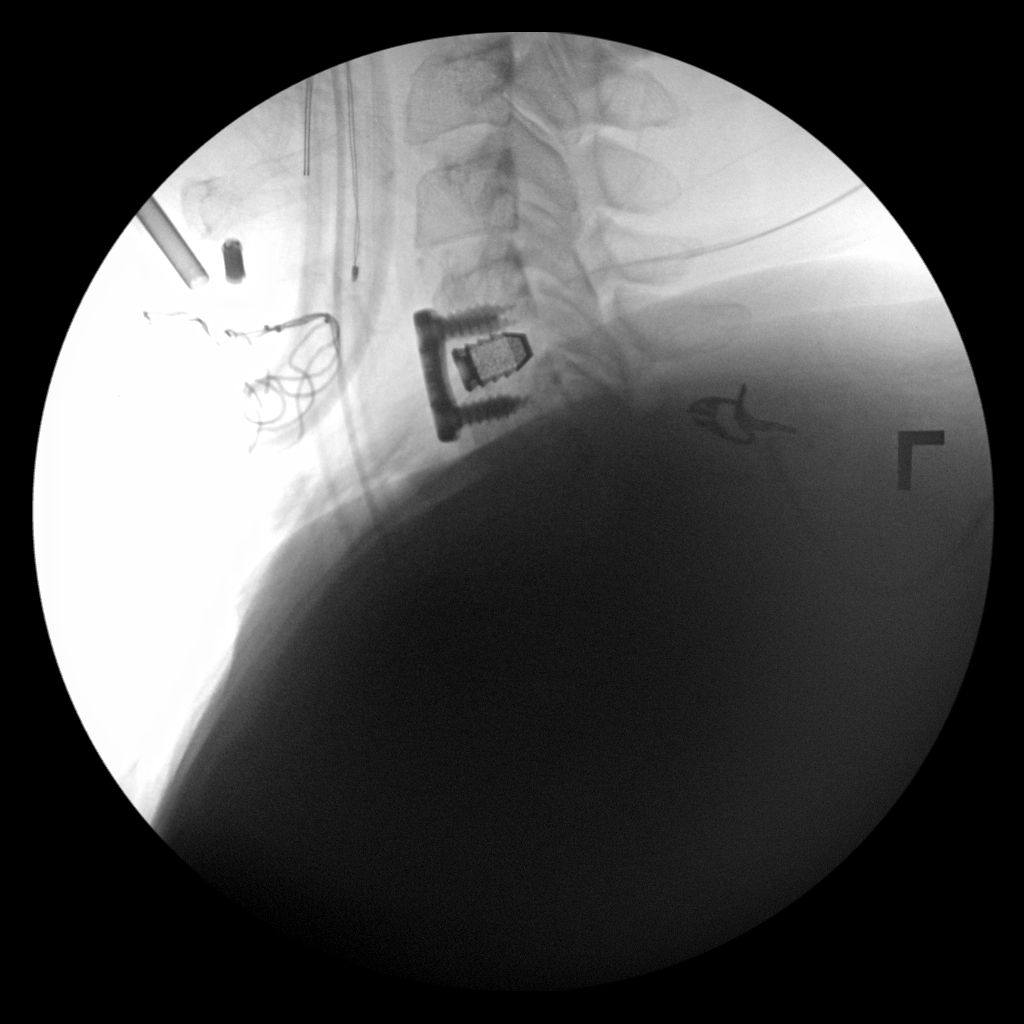
[im 2/2]
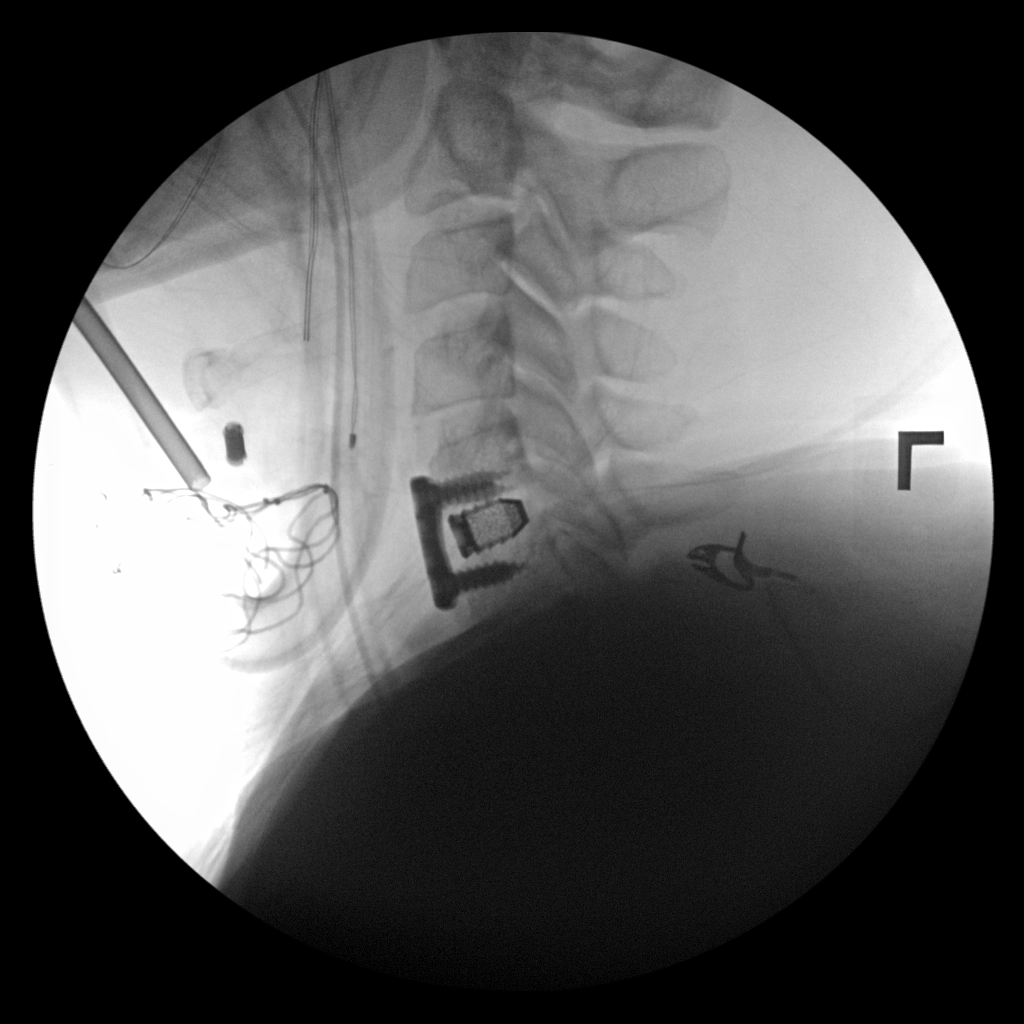

[2 of 2 positions shown; findings below may reference images not displayed]

FINDINGS: Two fluoroscopic images are obtained during the performance of the
procedure and are provided for interpretation only. Patient is
intubated. Postsurgical changes are seen from ACDF at C5/C6, with
anatomic alignment. Please refer to the operative report.

FLUOROSCOPY TIME:  9 seconds
IMPRESSION: 1. C5/C6 ACDF as above.

## 2020-02-06 SURGERY — ANTERIOR CERVICAL DECOMPRESSION/DISCECTOMY FUSION 1 LEVEL
Anesthesia: General | Site: Neck

## 2020-02-06 MED ORDER — ALUM & MAG HYDROXIDE-SIMETH 200-200-20 MG/5ML PO SUSP: 30.0000 mL | Freq: Four times a day (QID) | ORAL | Status: DC | PRN

## 2020-02-06 MED ORDER — ACETAMINOPHEN 325 MG PO TABS
650.0000 mg | ORAL_TABLET | ORAL | Status: DC | PRN
  Administered 2020-02-07 – 2020-02-08 (×2): 650 mg via ORAL
  Filled 2020-02-06 (×2): qty 2

## 2020-02-06 MED ORDER — ONDANSETRON HCL 4 MG/2ML IJ SOLN
INTRAMUSCULAR | Status: AC
  Filled 2020-02-06: qty 2

## 2020-02-06 MED ORDER — LACTATED RINGERS IV SOLN: INTRAVENOUS | Status: DC

## 2020-02-06 MED ORDER — DEXTROSE 50 % IV SOLN: 0.0000 mL | INTRAVENOUS | Status: DC | PRN

## 2020-02-06 MED ORDER — HYDROMORPHONE HCL 1 MG/ML IJ SOLN: 0.5000 mg | INTRAMUSCULAR | Status: DC | PRN

## 2020-02-06 MED ORDER — SODIUM CHLORIDE 0.9% FLUSH
3.0000 mL | Freq: Two times a day (BID) | INTRAVENOUS | Status: DC
  Administered 2020-02-06: 3 mL via INTRAVENOUS

## 2020-02-06 MED ORDER — INSULIN ASPART 100 UNIT/ML ~~LOC~~ SOLN
0.0000 [IU] | SUBCUTANEOUS | Status: DC
  Administered 2020-02-06: 5 [IU] via SUBCUTANEOUS
  Administered 2020-02-07: 3 [IU] via SUBCUTANEOUS
  Administered 2020-02-07: 5 [IU] via SUBCUTANEOUS
  Administered 2020-02-07 (×2): 3 [IU] via SUBCUTANEOUS

## 2020-02-06 MED ORDER — INSULIN GLARGINE 100 UNIT/ML ~~LOC~~ SOLN
16.0000 [IU] | Freq: Every day | SUBCUTANEOUS | Status: DC
  Administered 2020-02-07: 16 [IU] via SUBCUTANEOUS
  Filled 2020-02-06: qty 0.16

## 2020-02-06 MED ORDER — PANTOPRAZOLE SODIUM 40 MG PO TBEC
40.0000 mg | DELAYED_RELEASE_TABLET | Freq: Every day | ORAL | Status: DC
  Administered 2020-02-06 – 2020-02-07 (×2): 40 mg via ORAL
  Filled 2020-02-06 (×2): qty 1

## 2020-02-06 MED ORDER — ONDANSETRON HCL 4 MG/2ML IJ SOLN
INTRAMUSCULAR | Status: DC | PRN
  Administered 2020-02-06: 4 mg via INTRAVENOUS

## 2020-02-06 MED ORDER — LIDOCAINE 2% (20 MG/ML) 5 ML SYRINGE
INTRAMUSCULAR | Status: DC | PRN
  Administered 2020-02-06: 40 mg via INTRAVENOUS

## 2020-02-06 MED ORDER — CYCLOBENZAPRINE HCL 10 MG PO TABS: 10.0000 mg | ORAL_TABLET | Freq: Three times a day (TID) | ORAL | Status: DC | PRN

## 2020-02-06 MED ORDER — THROMBIN 5000 UNITS EX SOLR
CUTANEOUS | Status: AC
  Filled 2020-02-06: qty 5000

## 2020-02-06 MED ORDER — DORZOLAMIDE HCL-TIMOLOL MAL 2-0.5 % OP SOLN
1.0000 [drp] | Freq: Every day | OPHTHALMIC | Status: DC
  Administered 2020-02-07 – 2020-02-08 (×2): 1 [drp] via OPHTHALMIC
  Filled 2020-02-06: qty 10

## 2020-02-06 MED ORDER — INSULIN ASPART 100 UNIT/ML ~~LOC~~ SOLN
3.0000 [IU] | Freq: Three times a day (TID) | SUBCUTANEOUS | Status: DC
  Administered 2020-02-07 (×2): 3 [IU] via SUBCUTANEOUS

## 2020-02-06 MED ORDER — INSULIN GLARGINE 100 UNIT/ML ~~LOC~~ SOLN
16.0000 [IU] | Freq: Once | SUBCUTANEOUS | Status: AC
  Administered 2020-02-06: 16 [IU] via SUBCUTANEOUS
  Filled 2020-02-06: qty 0.16

## 2020-02-06 MED ORDER — FENTANYL CITRATE (PF) 250 MCG/5ML IJ SOLN
INTRAMUSCULAR | Status: DC | PRN
  Administered 2020-02-06: 100 ug via INTRAVENOUS
  Administered 2020-02-06: 50 ug via INTRAVENOUS

## 2020-02-06 MED ORDER — HYDROXYZINE HCL 50 MG/ML IM SOLN
50.0000 mg | Freq: Four times a day (QID) | INTRAMUSCULAR | Status: DC | PRN
  Administered 2020-02-06: 50 mg via INTRAMUSCULAR
  Filled 2020-02-06: qty 1

## 2020-02-06 MED ORDER — ACETAMINOPHEN 650 MG RE SUPP: 650.0000 mg | RECTAL | Status: DC | PRN

## 2020-02-06 MED ORDER — AMISULPRIDE (ANTIEMETIC) 5 MG/2ML IV SOLN
INTRAVENOUS | Status: AC
  Filled 2020-02-06: qty 2

## 2020-02-06 MED ORDER — ROCURONIUM BROMIDE 10 MG/ML (PF) SYRINGE
PREFILLED_SYRINGE | INTRAVENOUS | Status: AC
  Filled 2020-02-06: qty 10

## 2020-02-06 MED ORDER — PHENYLEPHRINE 40 MCG/ML (10ML) SYRINGE FOR IV PUSH (FOR BLOOD PRESSURE SUPPORT)
PREFILLED_SYRINGE | INTRAVENOUS | Status: DC | PRN
  Administered 2020-02-06 (×2): 200 ug via INTRAVENOUS
  Administered 2020-02-06: 80 ug via INTRAVENOUS
  Administered 2020-02-06: 200 ug via INTRAVENOUS

## 2020-02-06 MED ORDER — CHLORHEXIDINE GLUCONATE 0.12 % MT SOLN
15.0000 mL | Freq: Once | OROMUCOSAL | Status: AC
  Administered 2020-02-06: 15 mL via OROMUCOSAL
  Filled 2020-02-06: qty 15

## 2020-02-06 MED ORDER — DEXMEDETOMIDINE (PRECEDEX) IN NS 20 MCG/5ML (4 MCG/ML) IV SYRINGE
PREFILLED_SYRINGE | INTRAVENOUS | Status: AC
  Filled 2020-02-06: qty 10

## 2020-02-06 MED ORDER — ONDANSETRON HCL 4 MG/2ML IJ SOLN
4.0000 mg | Freq: Once | INTRAMUSCULAR | Status: AC | PRN
  Administered 2020-02-06: 4 mg via INTRAVENOUS

## 2020-02-06 MED ORDER — INSULIN ASPART 100 UNIT/ML ~~LOC~~ SOLN: 0.0000 [IU] | Freq: Three times a day (TID) | SUBCUTANEOUS | Status: DC

## 2020-02-06 MED ORDER — PROPOFOL 10 MG/ML IV BOLUS
INTRAVENOUS | Status: AC
  Filled 2020-02-06: qty 20

## 2020-02-06 MED ORDER — EPHEDRINE 5 MG/ML INJ
INTRAVENOUS | Status: AC
  Filled 2020-02-06: qty 10

## 2020-02-06 MED ORDER — ROCURONIUM BROMIDE 10 MG/ML (PF) SYRINGE
PREFILLED_SYRINGE | INTRAVENOUS | Status: DC | PRN
  Administered 2020-02-06: 60 mg via INTRAVENOUS

## 2020-02-06 MED ORDER — GLUCOSE BLOOD VI STRP: 1.0000 | ORAL_STRIP | Freq: Four times a day (QID) | Status: DC

## 2020-02-06 MED ORDER — SUGAMMADEX SODIUM 200 MG/2ML IV SOLN
INTRAVENOUS | Status: DC | PRN
  Administered 2020-02-06: 200 mg via INTRAVENOUS

## 2020-02-06 MED ORDER — CEFAZOLIN SODIUM-DEXTROSE 2-3 GM-%(50ML) IV SOLR
INTRAVENOUS | Status: DC | PRN
  Administered 2020-02-06: 2 g via INTRAVENOUS

## 2020-02-06 MED ORDER — PHENYLEPHRINE HCL-NACL 10-0.9 MG/250ML-% IV SOLN
INTRAVENOUS | Status: DC | PRN
Start: 2020-02-06 — End: 2020-02-06
  Administered 2020-02-06: 50 ug/min via INTRAVENOUS

## 2020-02-06 MED ORDER — LEVOTHYROXINE SODIUM 112 MCG PO TABS
112.0000 ug | ORAL_TABLET | Freq: Every day | ORAL | Status: DC
  Administered 2020-02-07 – 2020-02-08 (×2): 112 ug via ORAL
  Filled 2020-02-06 (×3): qty 1

## 2020-02-06 MED ORDER — ONDANSETRON HCL 4 MG PO TABS: 4.0000 mg | ORAL_TABLET | Freq: Four times a day (QID) | ORAL | Status: DC | PRN

## 2020-02-06 MED ORDER — MENTHOL 3 MG MT LOZG: 1.0000 | LOZENGE | OROMUCOSAL | Status: DC | PRN

## 2020-02-06 MED ORDER — INSULIN ASPART 100 UNIT/ML ~~LOC~~ SOLN: 3.0000 [IU] | Freq: Three times a day (TID) | SUBCUTANEOUS | Status: DC

## 2020-02-06 MED ORDER — "PARADIGM QUICK-SET 43"" 9MM MISC": 1.0000 | Status: DC

## 2020-02-06 MED ORDER — FENTANYL CITRATE (PF) 100 MCG/2ML IJ SOLN: 25.0000 ug | INTRAMUSCULAR | Status: DC | PRN

## 2020-02-06 MED ORDER — CEFAZOLIN SODIUM 1 G IJ SOLR
INTRAMUSCULAR | Status: AC
  Filled 2020-02-06: qty 20

## 2020-02-06 MED ORDER — SODIUM CHLORIDE 0.9 % IV SOLN: 250.0000 mL | INTRAVENOUS | Status: DC

## 2020-02-06 MED ORDER — PARADIGM PUMP RESERVOIR 3ML MISC: 1.0000 | Status: DC

## 2020-02-06 MED ORDER — LIDOCAINE 2% (20 MG/ML) 5 ML SYRINGE
INTRAMUSCULAR | Status: AC
  Filled 2020-02-06: qty 5

## 2020-02-06 MED ORDER — SODIUM CHLORIDE 0.9% FLUSH: 3.0000 mL | INTRAVENOUS | Status: DC | PRN

## 2020-02-06 MED ORDER — HEMOSTATIC AGENTS (NO CHARGE) OPTIME
TOPICAL | Status: DC | PRN
  Administered 2020-02-06: 1 via TOPICAL

## 2020-02-06 MED ORDER — DEXAMETHASONE SODIUM PHOSPHATE 10 MG/ML IJ SOLN
INTRAMUSCULAR | Status: DC | PRN
  Administered 2020-02-06: 4 mg via INTRAVENOUS

## 2020-02-06 MED ORDER — ONDANSETRON HCL 4 MG/2ML IJ SOLN: 4.0000 mg | Freq: Four times a day (QID) | INTRAMUSCULAR | Status: DC | PRN

## 2020-02-06 MED ORDER — INSULIN REGULAR(HUMAN) IN NACL 100-0.9 UT/100ML-% IV SOLN
INTRAVENOUS | Status: DC
  Filled 2020-02-06: qty 100

## 2020-02-06 MED ORDER — ORAL CARE MOUTH RINSE: 15.0000 mL | Freq: Once | OROMUCOSAL | Status: AC

## 2020-02-06 MED ORDER — 0.9 % SODIUM CHLORIDE (POUR BTL) OPTIME
TOPICAL | Status: DC | PRN
  Administered 2020-02-06: 1000 mL

## 2020-02-06 MED ORDER — AMISULPRIDE (ANTIEMETIC) 5 MG/2ML IV SOLN
5.0000 mg | Freq: Once | INTRAVENOUS | Status: AC
  Administered 2020-02-06: 5 mg via INTRAVENOUS

## 2020-02-06 MED ORDER — DEXAMETHASONE SODIUM PHOSPHATE 10 MG/ML IJ SOLN
INTRAMUSCULAR | Status: AC
  Filled 2020-02-06: qty 1

## 2020-02-06 MED ORDER — BRIMONIDINE TARTRATE 0.15 % OP SOLN
1.0000 [drp] | Freq: Every day | OPHTHALMIC | Status: DC | PRN
  Filled 2020-02-06: qty 5

## 2020-02-06 MED ORDER — OXYCODONE HCL 5 MG PO TABS: 10.0000 mg | ORAL_TABLET | ORAL | Status: DC | PRN

## 2020-02-06 MED ORDER — THROMBIN 5000 UNITS EX SOLR
CUTANEOUS | Status: DC | PRN
  Administered 2020-02-06 (×2): 5000 [IU] via TOPICAL

## 2020-02-06 MED ORDER — MIDAZOLAM HCL 2 MG/2ML IJ SOLN
INTRAMUSCULAR | Status: AC
  Filled 2020-02-06: qty 2

## 2020-02-06 MED ORDER — PHENOL 1.4 % MT LIQD: 1.0000 | OROMUCOSAL | Status: DC | PRN

## 2020-02-06 MED ORDER — TRIAMTERENE-HCTZ 75-50 MG PO TABS
1.0000 | ORAL_TABLET | Freq: Every day | ORAL | Status: DC
  Administered 2020-02-06 – 2020-02-07 (×2): 1 via ORAL
  Filled 2020-02-06 (×3): qty 1

## 2020-02-06 MED ORDER — CEFAZOLIN SODIUM-DEXTROSE 2-4 GM/100ML-% IV SOLN
2.0000 g | Freq: Three times a day (TID) | INTRAVENOUS | Status: AC
  Administered 2020-02-06 – 2020-02-07 (×2): 2 g via INTRAVENOUS
  Filled 2020-02-06 (×2): qty 100

## 2020-02-06 MED ORDER — EPHEDRINE SULFATE-NACL 50-0.9 MG/10ML-% IV SOSY
PREFILLED_SYRINGE | INTRAVENOUS | Status: DC | PRN
  Administered 2020-02-06: 5 mg via INTRAVENOUS

## 2020-02-06 MED ORDER — MIDAZOLAM HCL 2 MG/2ML IJ SOLN
INTRAMUSCULAR | Status: DC | PRN
  Administered 2020-02-06: 2 mg via INTRAVENOUS

## 2020-02-06 MED ORDER — FENTANYL CITRATE (PF) 250 MCG/5ML IJ SOLN
INTRAMUSCULAR | Status: AC
  Filled 2020-02-06: qty 5

## 2020-02-06 MED ORDER — PROPOFOL 10 MG/ML IV BOLUS
INTRAVENOUS | Status: DC | PRN
  Administered 2020-02-06: 110 mg via INTRAVENOUS

## 2020-02-06 MED ORDER — PANTOPRAZOLE SODIUM 40 MG IV SOLR: 40.0000 mg | Freq: Every day | INTRAVENOUS | Status: DC

## 2020-02-06 MED ORDER — SODIUM CHLORIDE 0.9 % IV SOLN: INTRAVENOUS | Status: DC | PRN

## 2020-02-06 MED ORDER — INSULIN REGULAR(HUMAN) IN NACL 100-0.9 UT/100ML-% IV SOLN
INTRAVENOUS | Status: DC | PRN
  Administered 2020-02-06: 4.2 [IU]/h via INTRAVENOUS

## 2020-02-06 MED ORDER — THROMBIN 5000 UNITS EX SOLR: OROMUCOSAL | Status: DC | PRN

## 2020-02-06 SURGICAL SUPPLY — 69 items
ADH SKN CLS APL DERMABOND .7 (GAUZE/BANDAGES/DRESSINGS) ×1
APL SKNCLS STERI-STRIP NONHPOA (GAUZE/BANDAGES/DRESSINGS) ×1
BAG DECANTER FOR FLEXI CONT (MISCELLANEOUS) ×2 IMPLANT
BAND INSRT 18 STRL LF DISP RB (MISCELLANEOUS) ×2
BAND RUBBER #18 3X1/16 STRL (MISCELLANEOUS) ×4 IMPLANT
BASKET BONE COLLECTION (BASKET) ×2 IMPLANT
BENZOIN TINCTURE PRP APPL 2/3 (GAUZE/BANDAGES/DRESSINGS) ×2 IMPLANT
BIT DRILL NEURO 2X3.1 SFT TUCH (MISCELLANEOUS) ×1 IMPLANT
BONE VIVIGEN FORMABLE 1.3CC (Bone Implant) ×2 IMPLANT
BUR MATCHSTICK NEURO 3.0 LAGG (BURR) ×2 IMPLANT
CANISTER SUCT 3000ML PPV (MISCELLANEOUS) ×2 IMPLANT
CARTRIDGE OIL MAESTRO DRILL (MISCELLANEOUS) ×1 IMPLANT
COVER WAND RF STERILE (DRAPES) ×1 IMPLANT
DERMABOND ADVANCED (GAUZE/BANDAGES/DRESSINGS) ×1
DERMABOND ADVANCED .7 DNX12 (GAUZE/BANDAGES/DRESSINGS) IMPLANT
DIFFUSER DRILL AIR PNEUMATIC (MISCELLANEOUS) ×2 IMPLANT
DRAPE C-ARM 42X72 X-RAY (DRAPES) ×4 IMPLANT
DRAPE LAPAROTOMY 100X72 PEDS (DRAPES) ×2 IMPLANT
DRAPE MICROSCOPE LEICA (MISCELLANEOUS) ×2 IMPLANT
DRILL NEURO 2X3.1 SOFT TOUCH (MISCELLANEOUS) ×2
DRSG OPSITE POSTOP 4X6 (GAUZE/BANDAGES/DRESSINGS) ×1 IMPLANT
DURAPREP 6ML APPLICATOR 50/CS (WOUND CARE) ×2 IMPLANT
ELECT COATED BLADE 2.86 ST (ELECTRODE) ×2 IMPLANT
ELECT REM PT RETURN 9FT ADLT (ELECTROSURGICAL) ×2
ELECTRODE REM PT RTRN 9FT ADLT (ELECTROSURGICAL) ×1 IMPLANT
GAUZE 4X4 16PLY RFD (DISPOSABLE) IMPLANT
GAUZE SPONGE 4X4 12PLY STRL (GAUZE/BANDAGES/DRESSINGS) ×2 IMPLANT
GLOVE BIO SURGEON STRL SZ 6.5 (GLOVE) ×5 IMPLANT
GLOVE BIO SURGEON STRL SZ7 (GLOVE) ×1 IMPLANT
GLOVE BIO SURGEON STRL SZ8 (GLOVE) ×2 IMPLANT
GLOVE BIOGEL PI IND STRL 6.5 (GLOVE) IMPLANT
GLOVE BIOGEL PI IND STRL 7.0 (GLOVE) IMPLANT
GLOVE BIOGEL PI IND STRL 7.5 (GLOVE) IMPLANT
GLOVE BIOGEL PI INDICATOR 6.5 (GLOVE) ×3
GLOVE BIOGEL PI INDICATOR 7.0 (GLOVE) ×1
GLOVE BIOGEL PI INDICATOR 7.5 (GLOVE) ×1
GLOVE EXAM NITRILE XL STR (GLOVE) IMPLANT
GLOVE INDICATOR 8.5 STRL (GLOVE) ×2 IMPLANT
GOWN STRL REUS W/ TWL LRG LVL3 (GOWN DISPOSABLE) ×1 IMPLANT
GOWN STRL REUS W/ TWL XL LVL3 (GOWN DISPOSABLE) ×1 IMPLANT
GOWN STRL REUS W/TWL 2XL LVL3 (GOWN DISPOSABLE) ×1 IMPLANT
GOWN STRL REUS W/TWL LRG LVL3 (GOWN DISPOSABLE) ×8
GOWN STRL REUS W/TWL XL LVL3 (GOWN DISPOSABLE) ×2
GRAFT BNE MATRIX VG FRMBL SM 1 (Bone Implant) IMPLANT
HALTER HD/CHIN CERV TRACTION D (MISCELLANEOUS) ×2 IMPLANT
HEMOSTAT POWDER KIT SURGIFOAM (HEMOSTASIS) ×2 IMPLANT
KIT BASIN OR (CUSTOM PROCEDURE TRAY) ×2 IMPLANT
KIT TURNOVER KIT B (KITS) ×2 IMPLANT
NDL HYPO 18GX1.5 BLUNT FILL (NEEDLE) ×1 IMPLANT
NDL SPNL 20GX3.5 QUINCKE YW (NEEDLE) ×1 IMPLANT
NEEDLE HYPO 18GX1.5 BLUNT FILL (NEEDLE) IMPLANT
NEEDLE SPNL 20GX3.5 QUINCKE YW (NEEDLE) ×2 IMPLANT
NS IRRIG 1000ML POUR BTL (IV SOLUTION) ×2 IMPLANT
OIL CARTRIDGE MAESTRO DRILL (MISCELLANEOUS) ×2
PACK LAMINECTOMY NEURO (CUSTOM PROCEDURE TRAY) ×2 IMPLANT
PAD ARMBOARD 7.5X6 YLW CONV (MISCELLANEOUS) ×6 IMPLANT
PIN DISTRACTION 14MM (PIN) ×2 IMPLANT
PLATE ANT CERV XTEND 1 LV 14 (Plate) ×1 IMPLANT
SCREW VAR 4.2 XD SELF DRILL 14 (Screw) ×4 IMPLANT
SPACER HEDRON C 12X14X7 0D (Spacer) ×1 IMPLANT
SPONGE INTESTINAL PEANUT (DISPOSABLE) ×2 IMPLANT
SPONGE SURGIFOAM ABS GEL SZ50 (HEMOSTASIS) ×2 IMPLANT
STRIP CLOSURE SKIN 1/2X4 (GAUZE/BANDAGES/DRESSINGS) ×2 IMPLANT
SUT VIC AB 3-0 SH 8-18 (SUTURE) ×2 IMPLANT
SUT VICRYL 4-0 PS2 18IN ABS (SUTURE) ×2 IMPLANT
TAPE CLOTH 4X10 WHT NS (GAUZE/BANDAGES/DRESSINGS) IMPLANT
TOWEL GREEN STERILE (TOWEL DISPOSABLE) ×2 IMPLANT
TOWEL GREEN STERILE FF (TOWEL DISPOSABLE) ×2 IMPLANT
WATER STERILE IRR 1000ML POUR (IV SOLUTION) ×2 IMPLANT

## 2020-02-06 NOTE — Progress Notes (Signed)
Anesthesiology Note:  As noted, 64 year old female with longstanding Type 1 DM on insulin pump who underwent C5-6 ACDF by Dr. Saintclair Halsted. Insulin pump discontinued prior to surgery, insulin drip per endo-tool begun. Following surgery patient given 16 U Lantus SQ with CBGs Q 4h and Novolog 3 U TID with meals, and 0-9 U Novolog correction schedule as per DM coordinator recommendations. Insulin drip d/ced 2 hours after Lantus given, last CBG 134. Appreciate Timothy Lasso (DM coordinator) assistance. Patient will need assitance from DM coordinator in restarting insulin pump at usual settings.  Roberts Gaudy

## 2020-02-06 NOTE — Anesthesia Procedure Notes (Signed)
Procedure Name: Intubation Date/Time: 02/06/2020 12:47 PM Performed by: Lance Coon, CRNA Pre-anesthesia Checklist: Patient identified, Emergency Drugs available, Suction available, Patient being monitored and Timeout performed Patient Re-evaluated:Patient Re-evaluated prior to induction Oxygen Delivery Method: Circle system utilized Preoxygenation: Pre-oxygenation with 100% oxygen Induction Type: IV induction Ventilation: Mask ventilation without difficulty Laryngoscope Size: Miller and 3 Grade View: Grade III Tube type: Oral Tube size: 7.0 mm Number of attempts: 2 Airway Equipment and Method: Bougie stylet Placement Confirmation: ETT inserted through vocal cords under direct vision,  positive ETCO2 and breath sounds checked- equal and bilateral Secured at: 21 cm Tube secured with: Tape Dental Injury: Teeth and Oropharynx as per pre-operative assessment

## 2020-02-06 NOTE — Op Note (Signed)
Preoperative diagnosis: Cervical spondylitic myelopathy from severe cervical stenosis with cord compression C5-6  Postoperative diagnosis: Same  Procedure: Anterior cervical discectomy and fusion C5-6 utilizing [place] titanium cages packed with locally harvested autograft mixed with division as well as anterior cervical plating with globus extend plating system  Surgeon: Dominica Severin Somaly Marteney  Assistant: Nash Shearer  Anesthesia: General  EBL: Minimal  HPI: 64 year old female progressive worsening neck pain bilateral shoulder and arm pain numbness tingling weakness in her hands and difficulty walking.  Work-up revealed severe cord compression from the large disc and spur at C5-6 due to patient's progression of clinical syndrome imaging findings and failed conservative treatment I recommended decompression stabilization procedure at C5-6 I have extensively gone over the risks and benefits of the operation with her as well as perioperative course expectations of outcome and alternatives of surgery and she understood and agreed to proceed forward.  Operative procedure: Patient was brought into the OR was due to general anesthesia positioned supine the neck in slight extension 5 pounds or halter traction.  The right 7 neck was prepped and draped in routine sterile fashion.  Preoperative x-ray localized the appropriate level.  A curvilinear incision was made just off the midline to the intraborder of the sternocleidomastoid and the superficial layer of the platysma was dissected out divided longitudinally.  The avascular plane between the sternomastoid and strap muscle was developed down to the fascia and prevertebral fascia was dissected away with Kitners.  Intraoperative x-ray identified the appropriate level.  Annulotomy was made with a 15 blade scalpel to marked the disc base.  Longus was then reflected laterally and self-retaining retractors placed.  Anterior osteophytes were bitten off the Leksell rongeur and  a 2 and 3 uh Kerrison punch disc base was then drilled down the posterior annulus and ossified complex captioning bone shavings and mucus trap.  Under microscopic lamination under biting the posterior annulus and posterior large ligament identification of thecal sac which was under marked stenosis with a large disc herniation as well as large spurs coming off both C5 and C6.  Aggressive undermining of both endplates remove the central spur extensive my disc here was removed from of the spinal cord both C6 pedicles were identified and both C6 nerve roots were skeletonized flush the pedicle.  At the end decompression was no further stenosis either centrally or foraminally I then selected a 7 mm 0 degree titanium cage packed with locally harvested autograft mixed with vivigen and inserted it 1 to 2 mm deep to the anterior to byline and then selected a globus extend plate use 40 mm screws all screws with excellent purchase locking mechanism was engaged.  Meticulous hemostasis was maintained some additional bone graft was packed underneath the plate on top of the graft as well as  laterally to the graft wounds then closed in layers with Vicryl skin was closed running 4 subcuticular Dermabond benzoin Steri-Strips and a sterile dressing was applied patient recovery in stable condition.  At the end the case all needle count sponge counts were correct.

## 2020-02-06 NOTE — H&P (Signed)
KOBIE MATKINS is an 64 y.o. female.   Chief Complaint: Gait difficulty balance difficulty numbness in her right hand HPI: 64 year old female with progressive worsening balance difficulty weakness and numbness in her right greater than left hand.  Work-up revealed severe spondylosis with cervical spinal cord compression from C5-6 patient clinical exam was consistent with a progressive myelopathy.  Due to her progression of clinical syndrome imaging findings and failed conservative treatment I recommended anterior cervical discectomy and fusion at that level.  I have extensively gone over the risks and benefits of that procedure with her as well as perioperative course expectations of outcome and alternatives of surgery and she understands and agrees to proceed forward.  Past Medical History:  Diagnosis Date  . Complication of anesthesia    trouble waking up  . CONSTIPATION, CHRONIC 12/13/2007  . DIABETES MELLITUS, TYPE I 01/07/2007  . DM nephropathy/sclerosis   . GLAUCOMA 12/13/2007  . HYPERCHOLESTEROLEMIA 12/13/2007  . Hypertension   . Hyperthyroidism   . INTERNAL HEMORRHOIDS 07/23/2008  . LEUKOPENIA, CHRONIC 12/13/2007  . Nonproliferative diabetic retinopathy IDP(824.23) 12/13/2007  . Unspecified hypothyroidism 12/13/2007  . VARICOSE VEINS, LOWER EXTREMITIES 12/13/2007    Past Surgical History:  Procedure Laterality Date  . ELECTROCARDIOGRAM  08/24/2006  . EYE SURGERY     bilateral  . LEEP N/A 12/01/2012   Procedure: LOOP ELECTROSURGICAL EXCISION PROCEDURE (LEEP);  Surgeon: Delice Lesch, MD;  Location: [facility];  Service: Gynecology;  Laterality: N/A;  . Stress Myoview   05/18/2004  . TUBAL LIGATION      Family History  Problem Relation Age of Onset  . Cancer Mother        Breast Cancer  . Diabetes Mother   . Breast cancer Mother   . Diabetes Father    Social History:  reports that she has never smoked. She has never used smokeless tobacco. She reports current alcohol use of about  1.0 standard drink of alcohol per week. She reports that she does not use drugs.  Allergies:  Allergies  Allergen Reactions  . Atorvastatin     REACTION: perceived myalgias  . Ciprofloxacin Nausea And Vomiting  . Epinephrine     REACTION: thyroid problems  . Erythromycin     Can not recall  . Morphine Nausea Only    Headache  . Penicillins Hives    Medications Prior to Admission  Medication Sig Dispense Refill  . amiloride-hydrochlorothiazide (MODURETIC) 5-50 MG tablet Take 0.5 tablets by mouth daily. 45 tablet 2  . brimonidine (ALPHAGAN P) 0.1 % SOLN Place 1 drop into both eyes daily as needed (red/irritated eyes).    . dorzolamide-timolol (COSOPT) 22.3-6.8 MG/ML ophthalmic solution Place 1 drop into both eyes daily.     . insulin lispro (HUMALOG) 100 UNIT/ML injection Inject 0.5 mLs (50 Units total) into the skin See admin instructions. 50 UNITS DAILY VIA PUMP 40 mL 3  . levothyroxine (SYNTHROID) 112 MCG tablet Take 1 tablet (112 mcg total) by mouth daily before breakfast. 90 tablet 3  . alum & mag hydroxide-simeth (MAALOX PLUS) 400-400-40 MG/5ML suspension Take 10 mLs by mouth every 6 (six) hours as needed for indigestion. (Patient not taking: Reported on 02/01/2020) 355 mL 0  . Cod Liver Oil CAPS Take 1 capsule by mouth daily.   (Patient not taking: Reported on 02/01/2020)    . fluticasone (FLONASE) 50 MCG/ACT nasal spray INSTILL 2 SPRAYS IN BOTH NOSTRILS DAILY (Patient not taking: Reported on 02/01/2020) 16 g 11  . Glucosamine 500  MG TABS Take 1 tablet by mouth daily.   (Patient not taking: Reported on 02/01/2020)    . glucose blood (ONETOUCH ULTRA) test strip 1 each by Other route 4 (four) times daily. And lancets 4/day 360 each 3  . hydrocortisone-pramoxine (ANALPRAM-HC) 2.5-1 % rectal cream Place rectally as needed for hemorrhoids. Apply to rectum as needed (Patient not taking: Reported on 02/01/2020) 30 g 1  . Insulin Infusion Pump Supplies (MINIMED INFUSION SET-MMT 396) MISC 1  Device by Other route every 3 (three) days. 30 each 3  . Insulin Infusion Pump Supplies (PARADIGM RESERVOIR 3ML) MISC 1 Device by Other route every 3 (three) days. 30 each 3  . magnesium oxide (MAG-OX) 400 MG tablet Take 2 tablets once daily  (Patient not taking: Reported on 02/01/2020)    . ondansetron (ZOFRAN ODT) 4 MG disintegrating tablet Take 1 tablet (4 mg total) by mouth every 8 (eight) hours as needed for nausea or vomiting. (Patient not taking: Reported on 02/01/2020) 20 tablet 0  . vitamin E 400 UNIT capsule Take 2 capsules by mouth twice a day as needed  (Patient not taking: Reported on 02/01/2020)      Results for orders placed or performed during the hospital encounter of 02/06/20 (from the past 48 hour(s))  Glucose, capillary     Status: Abnormal   Collection Time: 02/06/20  9:29 AM  Result Value Ref Range   Glucose-Capillary 188 (H) 70 - 99 mg/dL    Comment: Glucose reference range applies only to samples taken after fasting for at least 8 hours.   No results found.  Review of Systems  Musculoskeletal: Positive for gait problem and neck pain.  Neurological: Positive for weakness and numbness.    Blood pressure 137/73, pulse 73, temperature 98.1 F (36.7 C), temperature source Oral, resp. rate 20, height 5\' 3"  (1.6 m), weight 66.2 kg, last menstrual period 05/03/2013, SpO2 99 %. Physical Exam HENT:     Head: Normocephalic.     Nose: Nose normal.     Mouth/Throat:     Mouth: Mucous membranes are moist.  Eyes:     Pupils: Pupils are equal, round, and reactive to light.  Cardiovascular:     Rate and Rhythm: Normal rate.  Pulmonary:     Effort: Pulmonary effort is normal.  Abdominal:     General: Abdomen is flat.  Musculoskeletal:        General: Normal range of motion.  Skin:    General: Skin is warm.  Neurological:     General: No focal deficit present.     Mental Status: She is alert.     Comments: Patient is awake and alert strength is diffusely weak at 4+ out  of 5 upper extremities lower extremities appear to be 5 out of 5 with some hand intrinsic atrophy merrily on the right      Assessment/Plan 64 year old presents for ACDF C5-6  Elaina Hoops, MD 02/06/2020, 10:28 AM

## 2020-02-06 NOTE — Progress Notes (Signed)
Spoke with Dr. Linna Caprice and left number for Diabetic Coordinator to come see patient.  Lelan Pons, Diabetic coordinator is at bedside.

## 2020-02-06 NOTE — Progress Notes (Signed)
Inpatient Diabetes Program Recommendations  AACE/ADA: New Consensus Statement on Inpatient Glycemic Control  Target Ranges:  Prepandial:   less than 140 mg/dL      Peak postprandial:   less than 180 mg/dL (1-2 hours)      Critically ill patients:  140 - 180 mg/dL  Results for Katherine Navarro, Katherine Navarro (MRN [id number]) as of 02/06/2020 11:20  Ref. Range 02/04/2020 13:14 02/06/2020 09:29  Glucose-Capillary Latest Ref Range: 70 - 99 mg/dL 246 (H) 188 (H)   Results for Katherine Navarro, Katherine Navarro (MRN [id number]) as of 02/06/2020 11:20  Ref. Range 10/05/2019 08:51 02/04/2020 14:30  Hemoglobin A1C Latest Ref Range: 4.8 - 5.6 % 11.6 (A) 11.4 (H)   Review of Glycemic Control  Diabetes history: DM1; dx 1978; (makes NO insulin; will require basal, correction, and carbohydrate coverage insulin) Outpatient Diabetes medications: Medtronic with Humalog Current orders for Inpatient glycemic control: Will be using EndoTool IV insulin  Inpatient Diabetes Program Recommendations:    Insulin: Patient will be started on IV insulin per EndoTool for glycemic control. Please continue IV insulin until patient is in PACU then consider transitioning to Lantus 16 units Q24H, CBGs Q4H, Novolog 0-9 units Q4H, and Novolog 3 units TID with meals for meal coverage if patient eats at least 50% of meals. IV insulin should be continued for 2 hours after Lantus dose is given (as onset of Lantus is about 2 hours).  NOTE: Spoke with patient regarding diabetes and home regimen for diabetes management.  Patient states that she is followed by Dr. Loanne Drilling for diabetes management and per chart she was last seen on 10/05/19. Patient uses a Medtronic insulin pump with Humalog insulin as an outpatient. Patient has insulin pump in the bed with her in suspend mode.  Patient states that she suspended her pump as directed by staff. Asked patient to disconnect insulin pump from infusion site so settings could be reviewed. Of note, patient is not able to see the  numbers or information on the insulin pump screen and states that her husband usually helps her with her using her insulin pump. Patient states that changes were made this morning with her basal insulin rates (they were decreased).  Current basal rates are:  12A 0.5 units/hour 1:30A 0.3 units/hour 11A 0.5 units/hour  Patient states that the basal rates are normally: 12A 1.5 units/hour 2A 0.5 units/hour  Verified insulin to carb ratio 1:15 (1 unit for 15 grams of carbs) and insulin sensitivity 1:51 (1 unit drops glucose 51 mg/dl).  Target Glucose Goals 120-140 mg/dl  Active insulin time: 3 hours  In talking with the patient she states that her blood glucose normally runs high 200-300's mg/dl and her A1C is usually over 11%. Discussed current A1C of 11.4% indicating an average glucose of 280 mg/dl over the past 2-3 months. Discussed glucose and A1C goals. Explained that DM control needs to be improved to decrease risk of complications from uncontrolled DM and especially following surgery. Explained how hyperglycemia can damage blood vessels which lead to the common complications seen with uncontrolled diabetes.  Encouraged patient to ask Dr. Loanne Drilling about upgrading to newer Medtronic insulin pump that could allow Auto Mode and perhaps improve DM control. Patient states that she has had severe hypoglycemia in the past and she gets symptomatic when glucose gets in normal glucose ranges. Discussed bringing glucose average down gradually so her body can adjust to normal glucose values so she does not get symptoms feeling she is hypoglycemic.  Informed patient that she  will be started on IV insulin prior to surgery and that after surgery, it would be recommended to use SQ insulin (Lantus and Novolog) while inpatient in order to improve glycemic control and keep glucose more in target goals (140-180 mg/dl while inpatient).  Patient's insulin pump was not reconnected at this time and she asked that her  insulin pump be placed on top of covers between her legs. Patient verbalized understanding of information discussed and states that she does not have any further questions related to diabetes at this time. Talked with Tammy, RN regarding conversation and recommendations. Also called Dr. Linna Caprice regarding conversation and recommendations to transtion to SQ insulin after surgery. Since patient has such poor glycemic control and not able to see her insulin pump well enough to input information in her insulin pump herself, would recommend keeping patient on SQ insulin following surgery and while inpatient. Patient has Type 1 DM and will require basal, correction, and meal coverage insulin.  Thanks, Barnie Alderman, RN, MSN, CDE Diabetes Coordinator Inpatient Diabetes Program [phone] (Team Pager from 8am to 5pm)

## 2020-02-06 NOTE — Transfer of Care (Signed)
Immediate Anesthesia Transfer of Care Note  Patient: Katherine Navarro  Procedure(s) Performed: Anterior Cervical Decompression Discectomy Fusion Cervical five-six (N/A Neck)  Patient Location: PACU  Anesthesia Type:General  Level of Consciousness: drowsy and patient cooperative  Airway & Oxygen Therapy: Patient Spontanous Breathing  Post-op Assessment: Report given to RN and Post -op Vital signs reviewed and stable  Post vital signs: Reviewed and stable  Last Vitals:  Vitals Value Taken Time  BP 154/72 02/06/20 1421  Temp    Pulse 89 02/06/20 1430  Resp 13 02/06/20 1430  SpO2 100 % 02/06/20 1430  Vitals shown include unvalidated device data.  Last Pain:  Vitals:   02/06/20 1001  TempSrc:   PainSc: 0-No pain         Complications: No complications documented.

## 2020-02-07 ENCOUNTER — Encounter (HOSPITAL_COMMUNITY): Payer: Self-pay | Admitting: Neurosurgery

## 2020-02-07 DIAGNOSIS — R2689 Other abnormalities of gait and mobility: Secondary | ICD-10-CM | POA: Diagnosis not present

## 2020-02-07 DIAGNOSIS — I10 Essential (primary) hypertension: Secondary | ICD-10-CM | POA: Diagnosis not present

## 2020-02-07 DIAGNOSIS — M4802 Spinal stenosis, cervical region: Secondary | ICD-10-CM | POA: Diagnosis not present

## 2020-02-07 DIAGNOSIS — Z79899 Other long term (current) drug therapy: Secondary | ICD-10-CM | POA: Diagnosis not present

## 2020-02-07 DIAGNOSIS — R200 Anesthesia of skin: Secondary | ICD-10-CM | POA: Diagnosis not present

## 2020-02-07 DIAGNOSIS — E109 Type 1 diabetes mellitus without complications: Secondary | ICD-10-CM | POA: Diagnosis not present

## 2020-02-07 DIAGNOSIS — R531 Weakness: Secondary | ICD-10-CM | POA: Diagnosis not present

## 2020-02-07 DIAGNOSIS — Z794 Long term (current) use of insulin: Secondary | ICD-10-CM | POA: Diagnosis not present

## 2020-02-07 DIAGNOSIS — M4712 Other spondylosis with myelopathy, cervical region: Secondary | ICD-10-CM | POA: Diagnosis not present

## 2020-02-07 DIAGNOSIS — E039 Hypothyroidism, unspecified: Secondary | ICD-10-CM | POA: Diagnosis not present

## 2020-02-07 LAB — CBC WITH DIFFERENTIAL/PLATELET
Abs Immature Granulocytes: 0.05 10*3/uL (ref 0.00–0.07)
Basophils Absolute: 0 10*3/uL (ref 0.0–0.1)
Basophils Relative: 0 %
Eosinophils Absolute: 0 10*3/uL (ref 0.0–0.5)
Eosinophils Relative: 0 %
HCT: 40.4 % (ref 36.0–46.0)
Hemoglobin: 13.3 g/dL (ref 12.0–15.0)
Immature Granulocytes: 1 %
Lymphocytes Relative: 12 %
Lymphs Abs: 1.3 10*3/uL (ref 0.7–4.0)
MCH: 27.2 pg (ref 26.0–34.0)
MCHC: 32.9 g/dL (ref 30.0–36.0)
MCV: 82.6 fL (ref 80.0–100.0)
Monocytes Absolute: 1 10*3/uL (ref 0.1–1.0)
Monocytes Relative: 10 %
Neutro Abs: 7.9 10*3/uL — ABNORMAL HIGH (ref 1.7–7.7)
Neutrophils Relative %: 77 %
Platelets: 322 10*3/uL (ref 150–400)
RBC: 4.89 MIL/uL (ref 3.87–5.11)
RDW: 13.4 % (ref 11.5–15.5)
WBC: 10.3 10*3/uL (ref 4.0–10.5)
nRBC: 0 % (ref 0.0–0.2)

## 2020-02-07 LAB — BASIC METABOLIC PANEL
Anion gap: 11 (ref 5–15)
BUN: 20 mg/dL (ref 8–23)
CO2: 26 mmol/L (ref 22–32)
Calcium: 10 mg/dL (ref 8.9–10.3)
Chloride: 96 mmol/L — ABNORMAL LOW (ref 98–111)
Creatinine, Ser: 1.09 mg/dL — ABNORMAL HIGH (ref 0.44–1.00)
GFR calc Af Amer: >60 mL/min (ref >60)
GFR calc non Af Amer: 54 mL/min — ABNORMAL LOW (ref >60)
Glucose, Bld: 203 mg/dL — ABNORMAL HIGH (ref 70–99)
Potassium: 3.7 mmol/L (ref 3.5–5.1)
Sodium: 133 mmol/L — ABNORMAL LOW (ref 135–145)

## 2020-02-07 LAB — GLUCOSE, CAPILLARY
Glucose-Capillary: 193 mg/dL — ABNORMAL HIGH (ref 70–99)
Glucose-Capillary: 211 mg/dL — ABNORMAL HIGH (ref 70–99)
Glucose-Capillary: 212 mg/dL — ABNORMAL HIGH (ref 70–99)
Glucose-Capillary: 219 mg/dL — ABNORMAL HIGH (ref 70–99)
Glucose-Capillary: 220 mg/dL — ABNORMAL HIGH (ref 70–99)
Glucose-Capillary: 242 mg/dL — ABNORMAL HIGH (ref 70–99)
Glucose-Capillary: 259 mg/dL — ABNORMAL HIGH (ref 70–99)

## 2020-02-07 MED ORDER — INSULIN ASPART 100 UNIT/ML ~~LOC~~ SOLN: 0.0000 [IU] | Freq: Three times a day (TID) | SUBCUTANEOUS | Status: DC

## 2020-02-07 MED ORDER — INSULIN ASPART 100 UNIT/ML ~~LOC~~ SOLN
0.0000 [IU] | Freq: Three times a day (TID) | SUBCUTANEOUS | Status: DC
  Administered 2020-02-07: 3 [IU] via SUBCUTANEOUS
  Administered 2020-02-08 (×2): 5 [IU] via SUBCUTANEOUS
  Administered 2020-02-08: 2 [IU] via SUBCUTANEOUS

## 2020-02-07 MED ORDER — INSULIN ASPART 100 UNIT/ML ~~LOC~~ SOLN
5.0000 [IU] | Freq: Three times a day (TID) | SUBCUTANEOUS | Status: DC
  Administered 2020-02-08 (×3): 5 [IU] via SUBCUTANEOUS

## 2020-02-07 MED ORDER — INSULIN ASPART 100 UNIT/ML ~~LOC~~ SOLN: 0.0000 [IU] | Freq: Every day | SUBCUTANEOUS | Status: DC

## 2020-02-07 MED ORDER — INSULIN GLARGINE 100 UNIT/ML ~~LOC~~ SOLN
20.0000 [IU] | Freq: Every day | SUBCUTANEOUS | Status: DC
  Administered 2020-02-08: 20 [IU] via SUBCUTANEOUS
  Filled 2020-02-07: qty 0.2

## 2020-02-07 MED ORDER — INSULIN ASPART 100 UNIT/ML ~~LOC~~ SOLN
0.0000 [IU] | Freq: Every day | SUBCUTANEOUS | Status: DC
  Administered 2020-02-07: 2 [IU] via SUBCUTANEOUS

## 2020-02-07 MED ORDER — INSULIN ASPART 100 UNIT/ML ~~LOC~~ SOLN: 5.0000 [IU] | Freq: Three times a day (TID) | SUBCUTANEOUS | Status: DC

## 2020-02-07 NOTE — Progress Notes (Signed)
Inpatient Diabetes Program Recommendations  AACE/ADA: New Consensus Statement on Inpatient Glycemic Control   Target Ranges:  Prepandial:   less than 140 mg/dL      Peak postprandial:   less than 180 mg/dL (1-2 hours)      Critically ill patients:  140 - 180 mg/dL   Results for [name], [name] (MRN [id number]) as of 02/07/2020 08:01  Ref. Range 02/06/2020 09:29 02/06/2020 12:06 02/06/2020 14:24 02/06/2020 15:17 02/06/2020 17:13 02/06/2020 20:01 02/06/2020 23:49 02/07/2020 04:18 02/07/2020 07:49  Glucose-Capillary Latest Ref Range: 70 - 99 mg/dL 188 (H) 192 (H) 205 (H) 181 (H) 134 (H) 252 (H) 259 (H) 212 (H) 242 (H)   Review of Glycemic Control  Diabetes history: DM1; dx 1978; (makes NO insulin; will require basal, correction, and carbohydrate coverage insulin) Outpatient Diabetes medications: Medtronic with Humalog Current orders for Inpatient glycemic control: Lantus 16 units daily, Novolog 0-9 units Q4H, Novolog 3 units TID with meals  Inpatient Diabetes Program Recommendations:    Insulin-Please consider increasing Lantus to 20 units daily and meal coverage to Novolog 5 units TID with meals.  NOTE: Patient received Decadron 4 mg x1 on 02/06/20 and was given Lantus 16 units at 15:13 on 02/06/20 prior to transition off IV insulin. Recommend to continue using SQ insulin regimen while inpatient.  Thanks, Barnie Alderman, RN, MSN, CDE Diabetes Coordinator Inpatient Diabetes Program [phone] (Team Pager from 8am to 5pm)

## 2020-02-07 NOTE — Progress Notes (Signed)
Subjective: Patient reports Patient lethargic arousable moves all extremities neurologic exam seems to be at baseline except for the lethargy  Objective: Vital signs in last 24 hours: Temp:  [97 F (36.1 C)-99.4 F (37.4 C)] 98.4 F (36.9 C) (08/19 0746) Pulse Rate:  [73-96] 94 (08/19 0746) Resp:  [12-20] 18 (08/19 0746) BP: (112-166)/(59-84) 130/74 (08/19 0746) SpO2:  [95 %-100 %] 99 % (08/19 0746) Weight:  [66.2 kg] 66.2 kg (08/18 0928)  Intake/Output from previous day: 08/18 0701 - 08/19 0700 In: 1450 [P.O.:50; I.V.:1400] Out: 50 [Blood:50] Intake/Output this shift: No intake/output data recorded.  As above lethargic moves all extremities strength stable incision clean dry and intact  Lab Results: Recent Labs    02/04/20 1430  WBC 3.6*  HGB 13.7  HCT 43.5  PLT 284   BMET Recent Labs    02/04/20 1430  NA 133*  K 4.0  CL 98  CO2 25  GLUCOSE 305*  BUN 21  CREATININE 1.09*  CALCIUM 10.5*    Studies/Results: DG Cervical Spine 1 View  Result Date: 02/06/2020 CLINICAL DATA:  C5-6 ACDF EXAM: DG CERVICAL SPINE - 1 VIEW; DG C-ARM 1-60 MIN COMPARISON:  11/21/2019 FINDINGS: Two fluoroscopic images are obtained during the performance of the procedure and are provided for interpretation only. Patient is intubated. Postsurgical changes are seen from ACDF at C5/C6, with anatomic alignment. Please refer to the operative report. FLUOROSCOPY TIME:  9 seconds IMPRESSION: 1. C5/C6 ACDF as above. Electronically Signed   By: Randa Ngo M.D.   On: 02/06/2020 16:00   DG C-Arm 1-60 Min  Result Date: 02/06/2020 CLINICAL DATA:  C5-6 ACDF EXAM: DG CERVICAL SPINE - 1 VIEW; DG C-ARM 1-60 MIN COMPARISON:  11/21/2019 FINDINGS: Two fluoroscopic images are obtained during the performance of the procedure and are provided for interpretation only. Patient is intubated. Postsurgical changes are seen from ACDF at C5/C6, with anatomic alignment. Please refer to the operative report. FLUOROSCOPY  TIME:  9 seconds IMPRESSION: 1. C5/C6 ACDF as above. Electronically Signed   By: Randa Ngo M.D.   On: 02/06/2020 16:00    Assessment/Plan: Postop day 1 ACDF the patient somnolent but it has been that way since coming to the floor postoperatively did receive some Flexeril but that really has not changed much she is received no other sedating medications check a BMP and CBC probable anesthesia somnolence we will continue to stimulate throughout the morning but vital signs are stable O2 saturations are good  LOS: 0 days     Elaina Hoops 02/07/2020, 7:58 AM

## 2020-02-07 NOTE — Evaluation (Signed)
Physical Therapy Evaluation Patient Details Name: Katherine Navarro MRN: [id number] DOB: [birth year] Today's Date: 02/07/2020   History of Present Illness  Pt is a 64 y/o female s/p C5-6 ACDF.  PMH includes: DM type 1, glaucoma, HTN, keukopenia, diabetic retinopathy.   Clinical Impression  Pt admitted with above diagnosis. At the time of PT eval, pt very lethargic and required increased time and cues for arousal. Up to +2 mod assist provided for mobility and pre-gait activity at the EOB. Pt's husband present and they were both educated on precautions during mobility. Pt currently with functional limitations due to the deficits listed below (see PT Problem List). Pt will benefit from skilled PT to increase their independence and safety with mobility to allow discharge to the venue listed below.      Follow Up Recommendations Home health PT;Supervision for mobility/OOB    Equipment Recommendations  3in1 (PT)    Recommendations for Other Services       Precautions / Restrictions Precautions Precautions: Cervical Precaution Booklet Issued: Yes (comment) Required Braces or Orthoses: Cervical Brace Cervical Brace: Soft collar (pt in soft collar, but no brace required per MD order ) Restrictions Weight Bearing Restrictions: No      Mobility  Bed Mobility Overal bed mobility: Needs Assistance Bed Mobility: Rolling;Sidelying to Sit;Sit to Sidelying Rolling: Mod assist Sidelying to sit: Max assist     Sit to sidelying: Max assist;+2 for safety/equipment General bed mobility comments: HOB elevated for transition to sitting, and flat when returning to supine. Pt following commands intermittently and hand over hand assist provided to reach for rails, etc.   Transfers Overall transfer level: Needs assistance Equipment used: Rolling walker (2 wheeled) Transfers: Sit to/from Stand Sit to Stand: Mod assist;+2 physical assistance         General transfer comment: VC's for hand placement  on seated surface for safety. +2 assist to power-up to full stand.   Ambulation/Gait Ambulation/Gait assistance: Mod assist;+2 safety/equipment;+2 physical assistance Gait Distance (Feet): 2 Feet Assistive device: Rolling walker (2 wheeled) Gait Pattern/deviations: Step-through pattern;Decreased stride length;Trunk flexed;Leaning posteriorly;Shuffle Gait velocity: Very slow Gait velocity interpretation: <1.31 ft/sec, indicative of household ambulator General Gait Details: Pt took a few side steps and 2 steps forward/backwards at the edge of the bed. This pre-gait activity required +2 assist for safety and balance support.   Stairs            Wheelchair Mobility    Modified Rankin (Stroke Patients Only)       Balance Overall balance assessment: Needs assistance Sitting-balance support: Feet supported;Bilateral upper extremity supported Sitting balance-Leahy Scale: Poor Sitting balance - Comments: up to mod assist Postural control: Right lateral lean                                   Pertinent Vitals/Pain Pain Assessment: Faces Faces Pain Scale: Hurts a little bit Pain Location: General grimacing with movement - likely around incision area but pt was unable to verbalize    Home Living Family/patient expects to be discharged to:: Private residence Living Arrangements: Spouse/significant other Available Help at Discharge: Family Type of Home: House Home Access: Stairs to enter Entrance Stairs-Rails: Left Entrance Stairs-Number of Steps: 5 Home Layout: Two level Home Equipment: Walker - 2 wheels      Prior Function Level of Independence: Needs assistance   Gait / Transfers Assistance Needed: Using the RW PTA and husband reports she  was limited with distance (household distances only)  ADL's / Homemaking Assistance Needed: Husband assisting with ADL's. Pt reports he helps more for bathing/dressing but she can toilet without assist.         Hand  Dominance        Extremity/Trunk Assessment   Upper Extremity Assessment Upper Extremity Assessment: Defer to OT evaluation    Lower Extremity Assessment Lower Extremity Assessment: Generalized weakness    Cervical / Trunk Assessment Cervical / Trunk Assessment: Other exceptions Cervical / Trunk Exceptions: s/p cervical surgery  Communication   Communication: No difficulties (limited verbalizations )  Cognition Arousal/Alertness: Lethargic Behavior During Therapy: Flat affect Overall Cognitive Status: Difficult to assess                                        General Comments      Exercises     Assessment/Plan    PT Assessment Patient needs continued PT services  PT Problem List Decreased strength;Decreased activity tolerance;Decreased balance;Decreased mobility;Decreased safety awareness;Decreased cognition;Decreased knowledge of use of DME;Decreased knowledge of precautions;Pain       PT Treatment Interventions DME instruction;Gait training;Functional mobility training;Stair training;Therapeutic activities;Therapeutic exercise;Neuromuscular re-education;Patient/family education    PT Goals (Current goals can be found in the Care Plan section)  Acute Rehab PT Goals Patient Stated Goal: unable  PT Goal Formulation: Patient unable to participate in goal setting Time For Goal Achievement: 02/21/20 Potential to Achieve Goals: Good    Frequency Min 5X/week   Barriers to discharge        Co-evaluation               AM-PAC PT "6 Clicks" Mobility  Outcome Measure Help needed turning from your back to your side while in a flat bed without using bedrails?: A Lot Help needed moving from lying on your back to sitting on the side of a flat bed without using bedrails?: A Lot Help needed moving to and from a bed to a chair (including a wheelchair)?: A Lot Help needed standing up from a chair using your arms (e.g., wheelchair or bedside chair)?: A  Lot Help needed to walk in hospital room?: Total Help needed climbing 3-5 steps with a railing? : Total 6 Click Score: 10    End of Session Equipment Utilized During Treatment: Gait belt;Cervical collar Activity Tolerance: Patient limited by lethargy Patient left: in bed;with call bell/phone within reach;with family/visitor present Nurse Communication: Mobility status PT Visit Diagnosis: Unsteadiness on feet (R26.81);Pain;Difficulty in walking, not elsewhere classified (R26.2) Pain - part of body:  (neck)    Time: 1638-4665 PT Time Calculation (min) (ACUTE ONLY): 23 min   Charges:   PT Evaluation $PT Eval Moderate Complexity: 1 Mod PT Treatments $Gait Training: 8-22 mins        Rolinda Roan, PT, DPT Acute Rehabilitation Services Pager: [phone] Office: [phone]   Thelma Comp 02/07/2020, 2:57 PM

## 2020-02-07 NOTE — Evaluation (Signed)
Occupational Therapy Evaluation Patient Details Name: Katherine Navarro MRN: [id number] DOB: [birth year] Today's Date: 02/07/2020    History of Present Illness Pt is a 64 y/o female s/p C5-6 ACDF.  PMH includes: DM type 1, glaucoma, HTN, keukopenia, diabetic retinopathy.    Clinical Impression   Patient supine in bed, lethargic, verbalizing name and birthday.  Difficulty maintaining eyes open, following minimal commands, and requires increased time to verbalize in [place].  Vitals assessed prior to session and VSS, MD and RN aware of lethargy--requested attempt of arousal.  Patient currently requires total assist for bed mobility (+2 to reposition in bed), R lateral lean at EOB and max-total assist to maintain midline--when asked "whats going on" voiced "I don't know".  Total assist for all self care at this time.  Further OT acutely to determine PLOF, baseline and dc plan required.  Will follow acutely.     Follow Up Recommendations  Other (comment) (TBD- further assessment required)    Equipment Recommendations  Other (comment) (TBD)    Recommendations for Other Services PT consult     Precautions / Restrictions Precautions Precautions: Cervical Precaution Booklet Issued: Yes (comment) Required Braces or Orthoses: Cervical Brace Cervical Brace: Soft collar (pt in soft collar, but no brace required per MD order ) Restrictions Weight Bearing Restrictions: No      Mobility Bed Mobility Overal bed mobility: Needs Assistance Bed Mobility: Rolling;Sidelying to Sit;Sit to Sidelying Rolling: Total assist Sidelying to sit: Total assist     Sit to sidelying: Total assist General bed mobility comments: total assist to roll towards L side and transition to/from EOB; R lateral lean in sitting; total assist +2 to boost up in bed   Transfers                 General transfer comment: unable due to lethargy     Balance Overall balance assessment: Needs  assistance Sitting-balance support: Feet supported;Bilateral upper extremity supported Sitting balance-Leahy Scale: Zero Sitting balance - Comments: max to total assist to maintain balance statically at EOB, R lateral lean  Postural control: Right lateral lean                                 ADL either performed or assessed with clinical judgement   ADL Overall ADL's : Needs assistance/impaired                                       General ADL Comments: total assist at this time      Vision Baseline Vision/History: Glaucoma (diabetic retinopathy) Additional Comments: further assessment      Perception     Praxis      Pertinent Vitals/Pain Pain Assessment: Faces Faces Pain Scale: No hurt     Hand Dominance     Extremity/Trunk Assessment Upper Extremity Assessment Upper Extremity Assessment: Difficult to assess due to impaired cognition (and lethargy )   Lower Extremity Assessment Lower Extremity Assessment: Defer to PT evaluation   Cervical / Trunk Assessment Cervical / Trunk Assessment: Other exceptions Cervical / Trunk Exceptions: s/p cervical surgery   Communication Communication Communication: No difficulties (limited verbalizations )   Cognition Arousal/Alertness: Lethargic Behavior During Therapy: Flat affect Overall Cognitive Status: Difficult to assess  General Comments: pt oriented to name and birthday, able to state [place] with increased time; otherwise following minimal commands with increased time    General Comments  RN, MD aware of lethragy; requested OT to assist with arousal; Vitals assessed prior to session     Exercises     Shoulder Instructions      Home Living Family/patient expects to be discharged to:: Private residence                                 Additional Comments: pt unable to report      Prior Functioning/Environment           Comments: unable to determine         OT Problem List: Decreased strength;Decreased range of motion;Decreased activity tolerance;Impaired balance (sitting and/or standing);Impaired vision/perception;Decreased coordination;Decreased cognition;Decreased safety awareness;Decreased knowledge of use of DME or AE;Decreased knowledge of precautions;Cardiopulmonary status limiting activity;Impaired sensation;Impaired UE functional use      OT Treatment/Interventions: Self-care/ADL training;DME and/or AE instruction;Balance training;Patient/family education;Cognitive remediation/compensation;Therapeutic activities;Therapeutic exercise;Energy conservation    OT Goals(Current goals can be found in the care plan section) Acute Rehab OT Goals Patient Stated Goal: unable  OT Goal Formulation: Patient unable to participate in goal setting Time For Goal Achievement: 02/21/20 Potential to Achieve Goals: Fair  OT Frequency: Min 2X/week   Barriers to D/C:            Co-evaluation              AM-PAC OT "6 Clicks" Daily Activity     Outcome Measure Help from another person eating meals?: Total Help from another person taking care of personal grooming?: Total Help from another person toileting, which includes using toliet, bedpan, or urinal?: Total Help from another person bathing (including washing, rinsing, drying)?: Total Help from another person to put on and taking off regular upper body clothing?: Total Help from another person to put on and taking off regular lower body clothing?: Total 6 Click Score: 6   End of Session Equipment Utilized During Treatment: Cervical collar Nurse Communication: Mobility status;Precautions;Other (comment) (lethargy)  Activity Tolerance: Patient limited by lethargy Patient left: in bed;with call bell/phone within reach;with bed alarm set;with SCD's reapplied;with nursing/sitter in room  OT Visit Diagnosis: Other abnormalities of gait and mobility  (R26.89);Muscle weakness (generalized) (M62.81)                Time: 5809-9833 OT Time Calculation (min): 15 min Charges:  OT General Charges $OT Visit: 1 Visit OT Evaluation $OT Eval Moderate Complexity: 1 Mod  Jolaine Artist, OT Acute Rehabilitation Services Pager [phone] Office [phone]   Katherine Navarro 02/07/2020, 8:16 AM

## 2020-02-07 NOTE — Progress Notes (Signed)
Anesthesiology Follow-up:  As noted patient with Type 1 DM poorly controlled on insulin pump underwent C5-6 ACDF yesterday by Dr. Saintclair Halsted. Transitioned from endo-tool to subq insulin in PACU. Now drinking and PO solid food. Daily Lantus increased today to 20 U and Novolog meal coverage increased to 5U TID per DM coordinator recommendation. Glucose 203 today at 13:47 per lab draw.   Roberts Gaudy, MD

## 2020-02-07 NOTE — TOC Initial Note (Signed)
Transition of Care [facility]) - Initial/Assessment Note    Patient Details  Name: Katherine Navarro MRN: [id number] Date of Birth: Feb 06, 1956  Transition of [facility]) CM/SW Contact:    Benard Halsted, LCSW Phone Number: 02/07/2020, 5:22 PM  Clinical Narrative:                 CSW received consult for possible home health services at time of discharge. CSW spoke with patient (spouse at bedside) regarding PT recommendation of Home Health PT at time of discharge. Patient reported that she would like home health services. CSW sent referral for review and was accepted by [facility] (Declined by Sherilyn Cooter, [facility]). Will require Home Health PT/OT and Face to Face orders. CSW provided Medicare [facility] ratings list. CSW confirmed PCP and address with patient. Patient states her husband will come pick her up at discharge. No further questions reported at this time.    Expected Discharge Plan: [place] Barriers to Discharge: Continued Medical Work up   Patient Goals and CMS Choice Patient states their goals for this hospitalization and ongoing recovery are:: Get stronger CMS Medicare.gov Compare Post Acute Care list provided to:: Patient Choice offered to / list presented to : Patient  Expected Discharge Plan and Services Expected Discharge Plan: [place]   Discharge Planning Services: CM Consult Post Acute Care Choice: [place] arrangements for the past 2 months: [place]: PT, OT [place] Agency: [place] ([place]) Date [place]: 02/07/20 Time [place]: Jane Lew Representative spoke with at [place]: Butch Penny  Prior Living Arrangements/Services Living arrangements for the past 2 months: [place] Lives with:: Spouse Patient language and need for interpreter reviewed:: Yes Do you feel safe going back to the place where you live?: Yes       Need for Family Participation in Patient Care: Yes (Comment) Care giver support system in place?: Yes (comment)   Criminal Activity/Legal Involvement Pertinent to Current Situation/Hospitalization: No - Comment as needed  Activities of Daily Living Home Assistive Devices/Equipment: CBG Meter, Walker (specify type), BIPAP, Shower chair with back ADL Screening (condition at time of admission) Patient's cognitive ability adequate to safely complete daily activities?: No Is the patient deaf or have difficulty hearing?: No Does the patient have difficulty seeing, even when wearing glasses/contacts?: Yes Does the patient have difficulty concentrating, remembering, or making decisions?: No Patient able to express need for assistance with ADLs?: Yes Does the patient have difficulty dressing or bathing?: Yes Independently performs ADLs?: No Communication: Independent Dressing (OT): Needs assistance Is this a change from baseline?: Pre-admission baseline Grooming: Needs assistance Is this a change from baseline?: Pre-admission baseline Feeding: Independent Bathing: Needs assistance Is this a change from baseline?: Change from baseline, expected to last <3 days Toileting: Needs assistance Is this a change from baseline?: Change from baseline, expected to last <3 days In/Out Bed: Needs assistance Is this a change from baseline?: Change from baseline, expected to last <3 days Does the patient have difficulty walking or climbing stairs?: Yes Weakness of Legs: Both Weakness of Arms/Hands: None  Permission Sought/Granted Permission sought to share information with : Facility Sport and exercise psychologist, Family Supports Permission granted to share information with : Yes, Verbal Permission Granted  Share Information with NAME: Edd Arbour  Permission granted to share info w AGENCY: [place]  Permission granted to share info w Relationship: Spouse  Permission granted to share info w Contact Information:  [phone]  Emotional Assessment Appearance:: Appears stated age Attitude/Demeanor/Rapport: Engaged, Gracious Affect (typically observed): Accepting, Appropriate, Pleasant Orientation: : Oriented to Self, Oriented to Place, Oriented to  Time, Oriented to Situation Alcohol / Substance Use: Not Applicable Psych Involvement: No (comment)  Admission diagnosis:  HNP (herniated nucleus pulposus), cervical [M50.20] Patient Active Problem List   Diagnosis Date Noted  . HNP (herniated nucleus pulposus), cervical 02/06/2020  . Atypical glandular cells on cervical Pap smear 10/31/2019  . Stenosis of cervix 10/31/2019  . Uterine leiomyoma 10/31/2019  . Vitamin D deficiency 10/31/2019  . Endometrial hyperplasia 12/04/2015  . Graves' orbitopathy 07/29/2015  . Proliferative diabetic retinopathy of both eyes with macular edema associated with type 2 diabetes mellitus ([place]) 03/25/2015  . Numbness 11/19/2014  . 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 ([place]) 10/13/2013  . Insulin pump in place 10/02/2013  . Arthralgia 05/14/2013  . Cervical intraepithelial neoplasia grade III with severe dysplasia 12/01/2012  . Constipation 07/23/2008  . Rectal bleeding 07/23/2008  . Hypothyroidism (acquired) 12/13/2007  . Hyperlipidemia associated with type 2 diabetes mellitus ([place]) 12/13/2007  . Diabetes mellitus ([place]) 01/07/2007  . Hypertension associated with diabetes ([place]) 01/07/2007   PCP:  Inda Coke, [place] Pharmacy:   [place], [address] [address] Phone: [phone] Fax: [phone]  [place], [place] - [address] [place] [address] Phone: [phone] Fax: [phone]     Social Determinants of Health (SDOH) Interventions    Readmission Risk Interventions No flowsheet data found.

## 2020-02-07 NOTE — Progress Notes (Signed)
CSW notes recommendation for [facility] PT/OT. Barrier includes Pharmacist, community.   Jahrel Borthwick LCSW

## 2020-02-08 DIAGNOSIS — Z794 Long term (current) use of insulin: Secondary | ICD-10-CM | POA: Diagnosis not present

## 2020-02-08 DIAGNOSIS — M4712 Other spondylosis with myelopathy, cervical region: Secondary | ICD-10-CM | POA: Diagnosis not present

## 2020-02-08 DIAGNOSIS — R2689 Other abnormalities of gait and mobility: Secondary | ICD-10-CM | POA: Diagnosis not present

## 2020-02-08 DIAGNOSIS — R200 Anesthesia of skin: Secondary | ICD-10-CM | POA: Diagnosis not present

## 2020-02-08 DIAGNOSIS — M4802 Spinal stenosis, cervical region: Secondary | ICD-10-CM | POA: Diagnosis not present

## 2020-02-08 DIAGNOSIS — I10 Essential (primary) hypertension: Secondary | ICD-10-CM | POA: Diagnosis not present

## 2020-02-08 DIAGNOSIS — R531 Weakness: Secondary | ICD-10-CM | POA: Diagnosis not present

## 2020-02-08 DIAGNOSIS — Z79899 Other long term (current) drug therapy: Secondary | ICD-10-CM | POA: Diagnosis not present

## 2020-02-08 DIAGNOSIS — E039 Hypothyroidism, unspecified: Secondary | ICD-10-CM | POA: Diagnosis not present

## 2020-02-08 DIAGNOSIS — E109 Type 1 diabetes mellitus without complications: Secondary | ICD-10-CM | POA: Diagnosis not present

## 2020-02-08 LAB — GLUCOSE, CAPILLARY
Glucose-Capillary: 239 mg/dL — ABNORMAL HIGH (ref 70–99)
Glucose-Capillary: 247 mg/dL — ABNORMAL HIGH (ref 70–99)
Glucose-Capillary: 122 mg/dL — ABNORMAL HIGH (ref 70–99)

## 2020-02-08 MED ORDER — SODIUM CHLORIDE 0.9 % IV BOLUS
500.0000 mL | Freq: Once | INTRAVENOUS | Status: AC
  Administered 2020-02-08: 500 mL via INTRAVENOUS

## 2020-02-08 MED ORDER — HYDROCODONE-ACETAMINOPHEN 5-325 MG PO TABS
1.0000 | ORAL_TABLET | ORAL | 0 refills | Status: AC | PRN
Start: 2020-02-08 — End: 2021-02-07

## 2020-02-08 MED ORDER — CYCLOBENZAPRINE HCL 10 MG PO TABS
10.0000 mg | ORAL_TABLET | Freq: Three times a day (TID) | ORAL | 0 refills | Status: DC | PRN
Start: 2020-02-08 — End: 2023-04-24

## 2020-02-08 NOTE — Progress Notes (Addendum)
Occupational Therapy Treatment Patient Details Name: Katherine Navarro MRN: [id number] DOB: [birth year] Today's Date: 02/08/2020    History of present illness Pt is a 64 y/o female s/p C5-6 ACDF.  PMH includes: DM type 1, glaucoma, HTN, keukopenia, diabetic retinopathy.    OT comments  Pt alert and readily willing to participate in therapy. Pt reports being assisted for ADL and IADL prior to admission. She has concerns about her hand function and UE weakness and demonstrates significant atrophy. She reports she has not had therapy in the past Pt noted to fatigue with walking and becoming increasingly unsafe as she fatigued. Recommending ST rehab in SNF prior to return home.   Follow Up Recommendations  SNF;Supervision/Assistance - 24 hour    Equipment Recommendations  3 in 1 bedside commode    Recommendations for Other Services      Precautions / Restrictions Precautions Precautions: Fall;Cervical Required Braces or Orthoses: Cervical Brace Cervical Brace: Soft collar Restrictions Weight Bearing Restrictions: No       Mobility Bed Mobility Overal bed mobility: Needs Assistance Bed Mobility: Rolling;Sidelying to Sit Rolling: Min guard Sidelying to sit: Min guard       General bed mobility comments: HOB up, increased time, no physical assist  Transfers Overall transfer level: Needs assistance Equipment used: Rolling walker (2 wheeled) Transfers: Sit to/from Stand Sit to Stand: Min guard         General transfer comment: verbal cues for hand placement with RW use    Balance Overall balance assessment: Needs assistance   Sitting balance-Leahy Scale: Fair       Standing balance-Leahy Scale: Poor                             ADL either performed or assessed with clinical judgement   ADL Overall ADL's : Needs assistance/impaired Eating/Feeding: Set up;Sitting Eating/Feeding Details (indicate cue type and reason): able to manage regular utensils              Upper Body Dressing : Moderate assistance;Sitting Upper Body Dressing Details (indicate cue type and reason): front opening gown                 Functional mobility during ADLs: Minimal assistance;Rolling walker;Cueing for safety General ADL Comments: Pt reports she spends a lot of time in the bed, walks to the bathroom, sometimes with hand held assist. Educated in use of 3 in 1 over toilet and as a shower seat.      Vision   Additional Comments: pt reports poor visual acuity   Perception     Praxis      Cognition Arousal/Alertness: Awake/alert Behavior During Therapy: Flat affect Overall Cognitive Status: No family/caregiver present to determine baseline cognitive functioning                                 General Comments: Pt disoriented to time, pt was able to state her PLOF, recalled that [facility] therapies were recommended         Exercises     Shoulder Instructions       General Comments      Pertinent Vitals/ Pain       Pain Assessment: No/denies pain  Home Living  Prior Functioning/Environment              Frequency  Min 2X/week        Progress Toward Goals  OT Goals(current goals can now be found in the care plan section)  Progress towards OT goals: Progressing toward goals  Acute Rehab OT Goals Patient Stated Goal: to go home today Time For Goal Achievement: 02/21/20 Potential to Achieve Goals: Good  Plan Discharge needs to be updated    Co-evaluation                 AM-PAC OT "6 Clicks" Daily Activity     Outcome Measure   Help from another person eating meals?: A Little Help from another person taking care of personal grooming?: A Little Help from another person toileting, which includes using toliet, bedpan, or urinal?: A Little Help from another person bathing (including washing, rinsing, drying)?: A Lot Help from another person to put  on and taking off regular upper body clothing?: A Lot Help from another person to put on and taking off regular lower body clothing?: Total 6 Click Score: 14    End of Session Equipment Utilized During Treatment: Rolling walker;Gait belt;Cervical collar  OT Visit Diagnosis: Other abnormalities of gait and mobility (R26.89);Muscle weakness (generalized) (M62.81)   Activity Tolerance Patient tolerated treatment well   Patient Left in chair;with call bell/phone within reach   Nurse Communication          Time: 0920-0950 OT Time Calculation (min): 30 min  Charges: OT General Charges $OT Visit: 1 Visit OT Treatments $Self Care/Home Management : 8-22 mins  Nestor Lewandowsky, OTR/L Acute Rehabilitation Services Pager: [phone] Office: [phone]   Malka So 02/08/2020, 11:52 AM

## 2020-02-08 NOTE — TOC Transition Note (Signed)
Transition of Care [facility]) - CM/SW Discharge Note   Patient Details  Name: CACHE DECOURSEY MRN: [id number] Date of Birth: April 27, 1956  [facility] CM/SW Contact:  Benard Halsted, LCSW Phone Number: 02/08/2020, 2:21 PM   Clinical Narrative:    CSW notified by SNF and home health agency that patient's insurance terminated on 01/20/20. [name] reported that he quit his job and they told him it would be active through August. [facility] agency) unable to accept patient. No other home health agency are able to accept patient. [name] agreeable to trying outpatient therapy and will follow up on cost. CSW sent referral to OP rehab on [place] also discussed applying for Medicaid/Disability for patient through [place]. [name] stated understanding and reported no other needs.    Final next level of care: OP Rehab Barriers to Discharge: Barriers Resolved   Patient Goals and CMS Choice Patient states their goals for this hospitalization and ongoing recovery are:: Get stronger CMS Medicare.gov Compare Post Acute Care list provided to:: Patient Choice offered to / list presented to : Patient  Discharge Placement                  Name of family member notified: Spouse Patient and family notified of of transfer: 02/08/20  Discharge Plan and Services   Discharge Planning Services: CM Consult Post Acute Care Choice: Home Health                    HH Arranged: PT, OT [facility] Agency: [place] ([place]) Date HH Agency Contacted: 02/07/20 Time [place]: [place] Representative spoke with at [place]: [place] ([place]) Interventions     Readmission Risk Interventions No flowsheet data found.

## 2020-02-08 NOTE — NC FL2 (Signed)
[place] LEVEL OF CARE SCREENING TOOL     IDENTIFICATION  Patient Name: Katherine Navarro Birthdate: 30-Apr-1956 Sex: female Admission Date (Current Location): 02/06/2020  [facility] and [place] Number:  Herbalist and Address:  [facility] [place], [place] [address], [place], [place] [address]      Provider Number: [id number]  Attending Physician Name and Address:  Kary Kos, MD  Relative Name and Phone Number:  Edd Arbour, spouse, [phone]    Current Level of Care: Hospital Recommended Level of Care: [place] Prior Approval Number:    Date Approved/Denied:   PASRR Number: [id number]  Discharge Plan: SNF    Current Diagnoses: Patient Active Problem List   Diagnosis Date Noted   HNP (herniated nucleus pulposus), cervical 02/06/2020   Atypical glandular cells on cervical Pap smear 10/31/2019   Stenosis of cervix 10/31/2019   Uterine leiomyoma 10/31/2019   Vitamin D deficiency 10/31/2019   Endometrial hyperplasia 12/04/2015   Graves' orbitopathy 07/29/2015   Proliferative diabetic retinopathy of both eyes with macular edema associated with type 2 diabetes mellitus ([place]) 03/25/2015   Numbness 11/19/2014   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 ([place]) 10/13/2013   Insulin pump in place 10/02/2013   Arthralgia 05/14/2013   Cervical intraepithelial neoplasia grade III with severe dysplasia 12/01/2012   Constipation 07/23/2008   Rectal bleeding 07/23/2008   Hypothyroidism (acquired) 12/13/2007   Hyperlipidemia associated with type 2 diabetes mellitus ([place]) 12/13/2007   Diabetes mellitus ([place]) 01/07/2007   Hypertension associated with diabetes ([place]) 01/07/2007    Orientation RESPIRATION BLADDER Height & Weight     Self, Time, Situation, Place  Normal Continent Weight: 146 lb (66.2 kg) Height:  5\' 3"  (160 cm)  BEHAVIORAL SYMPTOMS/MOOD NEUROLOGICAL BOWEL NUTRITION STATUS      Continent Diet  (Please see DC Summary)  AMBULATORY STATUS COMMUNICATION OF NEEDS Skin   Limited Assist Verbally Surgical wounds (Closed incision on neck; Closed incision on vagina from June)                       Personal Care Assistance Level of Assistance  Bathing, Feeding, Dressing Bathing Assistance: Maximum assistance Feeding assistance: Independent Dressing Assistance: Limited assistance     Functional Limitations Info  Sight, Hearing, Speech Sight Info: Adequate Hearing Info: Adequate Speech Info: Adequate    SPECIAL CARE FACTORS FREQUENCY  PT (By licensed PT), OT (By licensed OT)     PT Frequency: 5x/week OT Frequency: 5x/week            Contractures Contractures Info: Not present    Additional Factors Info  Code Status, Allergies, Insulin Sliding Scale Code Status Info: Full Allergies Info: Atorvastatin, Ciprofloxacin, Epinephrine, Erythromycin, Morphine, Penicillins   Insulin Sliding Scale Info: See dc summary       Current Medications (02/08/2020):  This is the current hospital active medication list Current Facility-Administered Medications  Medication Dose Route Frequency Provider Last Rate Last Admin   0.9 %  sodium chloride infusion  250 mL Intravenous Continuous Kary Kos, MD       acetaminophen (TYLENOL) tablet 650 mg  650 mg Oral Q4H PRN Kary Kos, MD   650 mg at 02/08/20 5885   Or   acetaminophen (TYLENOL) suppository 650 mg  650 mg Rectal Q4H PRN Kary Kos, MD       alum & mag hydroxide-simeth (MAALOX/MYLANTA) 200-200-20 MG/5ML suspension 30 mL  30 mL Oral Q6H PRN Kary Kos, MD  brimonidine (ALPHAGAN) 0.15 % ophthalmic solution 1 drop  1 drop Both Eyes Daily PRN Kary Kos, MD       cyclobenzaprine (FLEXERIL) tablet 10 mg  10 mg Oral TID PRN Kary Kos, MD       dorzolamide-timolol (COSOPT) 22.3-6.8 MG/ML ophthalmic solution 1 drop  1 drop Both Eyes Daily Kary Kos, MD   1 drop at 02/08/20 1031   HYDROmorphone (DILAUDID) injection 0.5 mg   0.5 mg Intravenous Q2H PRN Kary Kos, MD       hydrOXYzine (VISTARIL) injection 50 mg  50 mg Intramuscular Q6H PRN Kary Kos, MD   50 mg at 02/06/20 1850   insulin aspart (novoLOG) injection 0-15 Units  0-15 Units Subcutaneous TID WC Kary Kos, MD   5 Units at 02/08/20 0820   insulin aspart (novoLOG) injection 0-5 Units  0-5 Units Subcutaneous QHS Kary Kos, MD   2 Units at 02/07/20 2200   insulin aspart (novoLOG) injection 5 Units  5 Units Subcutaneous TID WC Kary Kos, MD   5 Units at 02/08/20 3300   insulin glargine (LANTUS) injection 20 Units  20 Units Subcutaneous Daily Kary Kos, MD   20 Units at 02/08/20 1031   levothyroxine (SYNTHROID) tablet 112 mcg  112 mcg Oral QAC breakfast Kary Kos, MD   112 mcg at 02/08/20 0615   menthol-cetylpyridinium (CEPACOL) lozenge 3 mg  1 lozenge Oral PRN Kary Kos, MD       Or   phenol (CHLORASEPTIC) mouth spray 1 spray  1 spray Mouth/Throat PRN Kary Kos, MD       ondansetron [facility]) tablet 4 mg  4 mg Oral Q6H PRN Kary Kos, MD       Or   ondansetron [facility]) injection 4 mg  4 mg Intravenous Q6H PRN Kary Kos, MD       oxyCODONE (Oxy IR/ROXICODONE) immediate release tablet 10 mg  10 mg Oral Q3H PRN Kary Kos, MD       pantoprazole (PROTONIX) EC tablet 40 mg  40 mg Oral QHS Kary Kos, MD   40 mg at 02/07/20 2105   sodium chloride flush (NS) 0.9 % injection 3 mL  3 mL Intravenous Q12H Kary Kos, MD   3 mL at 02/06/20 2015   sodium chloride flush (NS) 0.9 % injection 3 mL  3 mL Intravenous PRN Kary Kos, MD       triamterene-hydrochlorothiazide (MAXZIDE) 75-50 MG per tablet 1 tablet  1 tablet Oral Daily Kary Kos, MD   1 tablet at 02/07/20 1113     Discharge Medications: Please see discharge summary for a list of discharge medications.  Relevant Imaging Results:  Relevant Lab Results:   Additional Information SSN: [place], [place]

## 2020-02-08 NOTE — Discharge Instructions (Signed)
Wound Care  Keep the incision clean and dry remove the outer dressing in 2 days, leave the Steri-Strips intact.  Do not put any creams, lotions, or ointments on incision. Leave steri-strips on neck.  They will fall off by themselves.  Activity Walk each and every day, increasing distance each day. No lifting greater than 5 lbs.  Avoid excessive neck motion. No lifting no bending no twisting no driving or riding a car unless coming back and forth to see me. Wear neck brace at all times except when showering.   Diet Resume your normal diet.   Return to Work Will be discussed at you follow up appointment.  Call Your Doctor If Any of These Occur Redness, drainage, or swelling at the wound.  Temperature greater than 101 degrees. Severe pain not relieved by pain medication. Incision starts to come apart.  Follow Up Appt Call today for appointment in 1-2 weeks ([phone]) or for problems.  If you have any hardware placed in your spine, you will need an x-ray before your appointment.   

## 2020-02-08 NOTE — Discharge Summary (Signed)
Physician Discharge Summary  Patient ID: Katherine Navarro MRN: [id number] DOB/AGE: 62-Nov-1957 62 y.o.  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 The patient tolerated the procedure well and was taken to the recovery room and then to the floor in stable condition. The hospital course was routine. There were no complications. The wound remained clean dry and intact. Pt had appropriate neck soreness. No complaints of arm pain or new N/T/W. The patient remained afebrile with stable vital signs, and tolerated a regular diet. The patient continued to increase activities, and pain was well controlled with oral pain medications.   Consults: None  Significant Diagnostic Studies:  Results for orders placed or performed during the hospital encounter of 02/06/20  Glucose, capillary  Result Value Ref Range   Glucose-Capillary 188 (H) 70 - 99 mg/dL  Glucose, capillary  Result Value Ref Range   Glucose-Capillary 192 (H) 70 - 99 mg/dL  Glucose, capillary  Result Value Ref Range   Glucose-Capillary 205 (H) 70 - 99 mg/dL  Glucose, capillary  Result Value Ref Range   Glucose-Capillary 181 (H) 70 - 99 mg/dL  Glucose, capillary  Result Value Ref Range   Glucose-Capillary 134 (H) 70 - 99 mg/dL  Glucose, capillary  Result Value Ref Range   Glucose-Capillary 252 (H) 70 - 99 mg/dL   Comment 1 Notify RN    Comment 2 Document in Chart   Glucose, capillary  Result Value Ref Range   Glucose-Capillary 259 (H) 70 - 99 mg/dL   Comment 1 Notify RN    Comment 2 Document in Chart   Glucose, capillary  Result Value Ref Range   Glucose-Capillary 212 (H) 70 - 99 mg/dL  Glucose, capillary  Result Value Ref  Range   Glucose-Capillary 242 (H) 70 - 99 mg/dL  Basic metabolic panel  Result Value Ref Range   Sodium 133 (L) 135 - 145 mmol/L   Potassium 3.7 3.5 - 5.1 mmol/L   Chloride 96 (L) 98 - 111 mmol/L   CO2 26 22 - 32 mmol/L   Glucose, Bld 203 (H) 70 - 99 mg/dL   BUN 20 8 - 23 mg/dL   Creatinine, Ser 1.09 (H) 0.44 - 1.00 mg/dL   Calcium 10.0 8.9 - 10.3 mg/dL   GFR calc non Af Amer 54 (L) >60 mL/min   GFR calc Af Amer >60 >60 mL/min   Anion gap 11 5 - 15  CBC with Differential/Platelet  Result Value Ref Range   WBC 10.3 4.0 - 10.5 K/uL   RBC 4.89 3.87 - 5.11 MIL/uL   Hemoglobin 13.3 12.0 - 15.0 g/dL   HCT 40.4 36 - 46 %   MCV 82.6 80.0 - 100.0 fL   MCH 27.2 26.0 - 34.0 pg   MCHC 32.9 30.0 - 36.0 g/dL   RDW 13.4 11.5 - 15.5 %   Platelets 322 150 - 400 K/uL   nRBC 0.0 0.0 - 0.2 %   Neutrophils Relative % 77 %   Neutro Abs 7.9 (H) 1.7 - 7.7 K/uL   Lymphocytes Relative 12 %   Lymphs Abs 1.3 0.7 - 4.0 K/uL   Monocytes Relative 10 %   Monocytes Absolute 1.0 0 - 1 K/uL   Eosinophils Relative 0 %  Eosinophils Absolute 0.0 0 - 0 K/uL   Basophils Relative 0 %   Basophils Absolute 0.0 0 - 0 K/uL   Immature Granulocytes 1 %   Abs Immature Granulocytes 0.05 0.00 - 0.07 K/uL  Glucose, capillary  Result Value Ref Range   Glucose-Capillary 220 (H) 70 - 99 mg/dL  Glucose, capillary  Result Value Ref Range   Glucose-Capillary 193 (H) 70 - 99 mg/dL  Glucose, capillary  Result Value Ref Range   Glucose-Capillary 219 (H) 70 - 99 mg/dL  Glucose, capillary  Result Value Ref Range   Glucose-Capillary 211 (H) 70 - 99 mg/dL   Comment 1 Notify RN    Comment 2 Document in Chart   Glucose, capillary  Result Value Ref Range   Glucose-Capillary 239 (H) 70 - 99 mg/dL   Comment 1 Notify RN    Comment 2 Document in Chart   ABO/Rh  Result Value Ref Range   ABO/RH(D)      AB POS Performed at [facility], [address] [place], [place] [address]     DG Cervical Spine 1 View  Result  Date: 02/06/2020 CLINICAL DATA:  C5-6 ACDF EXAM: DG CERVICAL SPINE - 1 VIEW; DG C-ARM 1-60 MIN COMPARISON:  11/21/2019 FINDINGS: Two fluoroscopic images are obtained during the performance of the procedure and are provided for interpretation only. Patient is intubated. Postsurgical changes are seen from ACDF at C5/C6, with anatomic alignment. Please refer to the operative report. FLUOROSCOPY TIME:  9 seconds IMPRESSION: 1. C5/C6 ACDF as above. Electronically Signed   By: Randa Ngo M.D.   On: 02/06/2020 16:00   DG C-Arm 1-60 Min  Result Date: 02/06/2020 CLINICAL DATA:  C5-6 ACDF EXAM: DG CERVICAL SPINE - 1 VIEW; DG C-ARM 1-60 MIN COMPARISON:  11/21/2019 FINDINGS: Two fluoroscopic images are obtained during the performance of the procedure and are provided for interpretation only. Patient is intubated. Postsurgical changes are seen from ACDF at C5/C6, with anatomic alignment. Please refer to the operative report. FLUOROSCOPY TIME:  9 seconds IMPRESSION: 1. C5/C6 ACDF as above. Electronically Signed   By: Randa Ngo M.D.   On: 02/06/2020 16:00    Antibiotics:  Anti-infectives (From admission, onward)   Start     Dose/Rate Route Frequency Ordered Stop   02/06/20 2100  ceFAZolin (ANCEF) IVPB 2g/100 mL premix        2 g 200 mL/hr over 30 Minutes Intravenous Every 8 hours 02/06/20 1752 02/07/20 1236   02/06/20 1341  bacitracin 50,000 Units in sodium chloride 0.9 % 500 mL irrigation  Status:  Discontinued          As needed 02/06/20 1341 02/06/20 1416      Discharge Exam: Blood pressure (!) 89/63, pulse 82, temperature 97.7 F (36.5 C), temperature source Oral, resp. rate 18, height 5\' 3"  (1.6 m), weight 66.2 kg, last menstrual period 05/03/2013, SpO2 100 %. Neurologic: Grossly normal Ambulating and voiding well, iciscion cdi  Discharge Medications:   Allergies as of 02/08/2020      Reactions   Atorvastatin    REACTION: perceived myalgias   Ciprofloxacin Nausea And Vomiting    Epinephrine    REACTION: thyroid problems   Erythromycin    Can not recall   Morphine Nausea Only   Headache   Penicillins Hives      Medication List    STOP taking these medications   alum & mag hydroxide-simeth 035-009-38 MG/5ML suspension Commonly known as: [place]  Liver Oil Caps   fluticasone 50 MCG/ACT nasal spray Commonly known as: FLONASE   Glucosamine 500 MG Tabs   hydrocortisone-pramoxine 2.5-1 % rectal cream Commonly known as: Analpram HC   magnesium oxide 400 MG tablet Commonly known as: MAG-OX   ondansetron 4 MG disintegrating tablet Commonly known as: Zofran ODT   vitamin E 180 MG (400 UNITS) capsule     TAKE these medications   amiloride-hydrochlorothiazide 5-50 MG tablet Commonly known as: MODURETIC Take 0.5 tablets by mouth daily.   brimonidine 0.1 % Soln Commonly known as: ALPHAGAN P Place 1 drop into both eyes daily as needed (red/irritated eyes).   cyclobenzaprine 10 MG tablet Commonly known as: FLEXERIL Take 1 tablet (10 mg total) by mouth 3 (three) times daily as needed for muscle spasms.   dorzolamide-timolol 22.3-6.8 MG/ML ophthalmic solution Commonly known as: COSOPT Place 1 drop into both eyes daily.   HYDROcodone-acetaminophen 5-325 MG tablet Commonly known as: NORCO/VICODIN Take 1 tablet by mouth every 4 (four) hours as needed for moderate pain.   insulin lispro 100 UNIT/ML injection Commonly known as: HumaLOG Inject 0.5 mLs (50 Units total) into the skin See admin instructions. 50 UNITS DAILY VIA PUMP   levothyroxine 112 MCG tablet Commonly known as: SYNTHROID Take 1 tablet (112 mcg total) by mouth daily before breakfast.   OneTouch Ultra test strip Generic drug: glucose blood 1 each by Other route 4 (four) times daily. And lancets 4/day   PARADIGM RESERVOIR 3ML Misc 1 Device by Other route every 3 (three) days.   MINIMED INFUSION SET-MMT 396 Misc 1 Device by Other route every 3 (three) days.        Disposition: home   Final Dx: ACDF C5-6  Discharge Instructions     Remove dressing in 72 hours   Complete by: As directed    Call MD for:  difficulty breathing, headache or visual disturbances   Complete by: As directed    Call MD for:  hives   Complete by: As directed    Call MD for:  persistant dizziness or light-headedness   Complete by: As directed    Call MD for:  persistant nausea and vomiting   Complete by: As directed    Call MD for:  redness, tenderness, or signs of infection (pain, swelling, redness, odor or green/yellow discharge around incision site)   Complete by: As directed    Call MD for:  severe uncontrolled pain   Complete by: As directed    Call MD for:  temperature >100.4   Complete by: As directed    Diet - low sodium heart healthy   Complete by: As directed    Driving Restrictions   Complete by: As directed    No driving for 2 weeks, no riding in the car for 1 week   Face-to-face encounter (required for Medicare/Medicaid patients)   Complete by: As directed    I Eleonore Chiquito certify that this patient is under my care and that I, or a nurse practitioner or physician's assistant working with me, had a face-to-face encounter that meets the physician face-to-face encounter requirements with this patient on 02/08/2020. The encounter with the patient was in whole, or in part for the following medical condition(s) which is the primary reason for home health care (List medical condition): cervical spine fusion   The encounter with the patient was in whole, or in part, for the following medical condition, which is the primary reason for home health care: postop acdf  I certify that, based on my findings, the following services are medically necessary home health services: Physical therapy   Reason for Medically Necessary Home Health Services: Therapy- Personnel officer, Public librarian   My clinical findings support the need for the above  services: Pain interferes with ambulation/mobility   Further, I certify that my clinical findings support that this patient is homebound due to: Pain interferes with ambulation/mobility   Home Health   Complete by: As directed    To provide the following care/treatments:  PT OT     Increase activity slowly   Complete by: As directed    Lifting restrictions   Complete by: As directed    No lifting more than 8 lbs       Follow-up Information    [facility] Follow [facility].   Specialty: [place] Why: [place] PT/OT services arranged. They will contact you to arrange the first visit about 48 hours after discharge.                Signed: Ocie Cornfield Daschel Roughton 02/08/2020, 11:17 AM

## 2020-02-08 NOTE — Progress Notes (Signed)
Patient is discharged from room 3C06 at this time. Alert and in stable condition. IV site d/c'[d and instructions read to patient and spouse with understanding verbalized. Left unit via wheelchair with all belongings at side. 

## 2020-02-08 NOTE — TOC Progression Note (Signed)
Transition of Care [facility]) - Progression Note    Patient Details  Name: Katherine Navarro MRN: [id number] Date of Birth: 02/28/1956  Transition of Care [facility]) CM/SW [place], LCSW Phone Number: 02/08/2020, 12:25 PM  Clinical Narrative:    CSW received call from RN that patient does not feel strong enough to discharge home. SNF workup in process; will present bed offers as available. Insurance approval likely will not be received over the weekend.    Expected Discharge Plan: [place] Barriers to Discharge: Barriers Resolved  Expected Discharge Plan and Services Expected Discharge Plan: [place]   Discharge Planning Services: CM Consult Post Acute Care Choice: [place] arrangements for the past 2 months: Single Family Home Expected Discharge Date: 02/08/20                         HH Arranged: PT, OT HH Agency: [place] ([place]) Date [place]: 02/07/20 Time [place]: [place] Representative spoke with at [place]: [place] ([place]) Interventions    Readmission Risk Interventions No flowsheet data found.

## 2020-02-08 NOTE — TOC Transition Note (Signed)
Transition of Care [facility]) - CM/SW Discharge Note   Patient Details  Name: Katherine Navarro MRN: [id number] Date of Birth: [birth year]  Transition of Care [facility]) CM/SW Contact:  Benard Halsted, LCSW Phone Number: 02/08/2020, 11:41 AM   Clinical Narrative:    Patient discharging home today. Home Health orders placed for [place]. No other needs identified.    Final next level of care: [place] [facility] [place] Barriers to Discharge: Barriers Resolved   Patient Goals and CMS Choice Patient states their goals for this hospitalization and ongoing recovery are:: Get stronger CMS Medicare.gov Compare Post Acute Care list provided to:: Patient Choice offered to / list presented to : Patient  Discharge Placement                  Name of family member notified: Spouse Patient and family notified of of transfer: 02/08/20  Discharge Plan and Services   Discharge Planning Services: CM Consult Post Acute Care Choice: Home Health                    HH Arranged: PT, OT [facility] Agency: [place] ([place]) Date HH Agency Contacted: 02/07/20 Time [place]: [place] Representative spoke with at [place]: [place] ([place]) Interventions     Readmission Risk Interventions No flowsheet data found.

## 2020-02-08 NOTE — Anesthesia Postprocedure Evaluation (Signed)
Anesthesia Post Note  Patient: Katherine Navarro  Procedure(s) Performed: Anterior Cervical Decompression Discectomy Fusion Cervical five-six (N/A Neck)     Patient location during evaluation: PACU Anesthesia Type: General Level of consciousness: awake and alert Pain management: pain level controlled Vital Signs Assessment: post-procedure vital signs reviewed and stable Respiratory status: spontaneous breathing, nonlabored ventilation, respiratory function stable and patient connected to nasal cannula oxygen Cardiovascular status: blood pressure returned to baseline and stable Postop Assessment: no apparent nausea or vomiting Anesthetic complications: no   No complications documented.  Last Vitals:  Vitals:   02/08/20 1108 02/08/20 1554  BP: (!) 89/63 (!) 122/57  Pulse: 82 75  Resp:    Temp: 36.5 C 36.6 C  SpO2: 100% 97%    Last Pain:  Vitals:   02/08/20 1554  TempSrc: Oral  PainSc:                  [name]

## 2020-02-08 NOTE — Progress Notes (Signed)
Physical Therapy Treatment Patient Details Name: Katherine Navarro MRN: [id number] DOB: 1955-07-04 Today's Date: 02/08/2020    History of Present Illness Pt is a 64 y/o female s/p C5-6 ACDF.  PMH includes: DM type 1, glaucoma, HTN, keukopenia, diabetic retinopathy.     PT Comments    Pt progressing well with post-op mobility. She was able to demonstrate transfers and ambulation with up to +2 min assist as pt fatigued. Pt was educated on precautions, brace application/wearing schedule, appropriate activity progression, and car transfer. Feel this pt could benefit from short term rehab at the SNF level prior to return home to maximize functional independence, safety, and decrease risk for falls. Will continue to follow.      Follow Up Recommendations  SNF     Equipment Recommendations  3in1 (PT)    Recommendations for Other Services       Precautions / Restrictions Precautions Precautions: Fall;Cervical Precaution Booklet Issued: Yes (comment) Required Braces or Orthoses: Cervical Brace Cervical Brace: Soft collar Restrictions Weight Bearing Restrictions: No    Mobility  Bed Mobility Overal bed mobility: Needs Assistance Bed Mobility: Rolling;Sidelying to Sit Rolling: Min guard Sidelying to sit: Min guard       General bed mobility comments: HOB up, increased time, no physical assist  Transfers Overall transfer level: Needs assistance Equipment used: Rolling walker (2 wheeled) Transfers: Sit to/from Stand Sit to Stand: Min guard         General transfer comment: verbal cues for hand placement with RW use  Ambulation/Gait Ambulation/Gait assistance: Min assist;+2 physical assistance;+2 safety/equipment Gait Distance (Feet): 75 Feet Assistive device: Rolling walker (2 wheeled) Gait Pattern/deviations: Shuffle;Trunk flexed;Narrow base of support Gait velocity: Very slow Gait velocity interpretation: <1.31 ft/sec, indicative of household ambulator General Gait  Details: VC's for improved posture, closer walker proximity, and forward gaze. As pt fatigued, posture became poorer and required assist for trunk extension and hip extension. +2 towards end of gait training to get safely back to the chair.    Stairs             Wheelchair Mobility    Modified Rankin (Stroke Patients Only)       Balance Overall balance assessment: Needs assistance Sitting-balance support: Feet supported;Bilateral upper extremity supported Sitting balance-Leahy Scale: Fair     Standing balance support: Bilateral upper extremity supported;During functional activity Standing balance-Leahy Scale: Poor                              Cognition Arousal/Alertness: Awake/alert Behavior During Therapy: Flat affect Overall Cognitive Status: No family/caregiver present to determine baseline cognitive functioning                                 General Comments: Pt disoriented to time, pt was able to state her PLOF, recalled that [facility] therapies were recommended       Exercises      General Comments        Pertinent Vitals/Pain Pain Assessment: No/denies pain    Home Living                      Prior Function            PT Goals (current goals can now be found in the care plan section) Acute Rehab PT Goals Patient Stated Goal: to go home today PT Goal  Formulation: With patient Time For Goal Achievement: 02/21/20 Potential to Achieve Goals: Good Progress towards PT goals: Progressing toward goals    Frequency    Min 5X/week      PT Plan Discharge plan needs to be updated    Co-evaluation PT/OT/SLP Co-Evaluation/Treatment: Yes Reason for Co-Treatment: Complexity of the patient's impairments (multi-system involvement);To address functional/ADL transfers;Necessary to address cognition/behavior during functional activity PT goals addressed during session: Mobility/safety with mobility;Balance;Proper use of DME         AM-PAC PT "6 Clicks" Mobility   Outcome Measure  Help needed turning from your back to your side while in a flat bed without using bedrails?: None Help needed moving from lying on your back to sitting on the side of a flat bed without using bedrails?: None Help needed moving to and from a bed to a chair (including a wheelchair)?: A Little Help needed standing up from a chair using your arms (e.g., wheelchair or bedside chair)?: A Little Help needed to walk in hospital room?: A Lot Help needed climbing 3-5 steps with a railing? : Total 6 Click Score: 17    End of Session Equipment Utilized During Treatment: Gait belt;Cervical collar Activity Tolerance: Patient tolerated treatment well Patient left: in chair;with call bell/phone within reach Nurse Communication: Mobility status PT Visit Diagnosis: Unsteadiness on feet (R26.81);Pain;Difficulty in walking, not elsewhere classified (R26.2) Pain - part of body:  (neck)     Time: 8657-8469 PT Time Calculation (min) (ACUTE ONLY): 23 min  Charges:  $Gait Training: 8-22 mins                     Rolinda Roan, PT, DPT Acute Rehabilitation Services Pager: [phone] Office: [phone]    Thelma Comp 02/08/2020, 12:36 PM

## 2020-02-14 ENCOUNTER — Ambulatory Visit: Payer: BC Managed Care – PPO

## 2020-02-19 ENCOUNTER — Encounter (HOSPITAL_COMMUNITY): Payer: Self-pay | Admitting: Neurosurgery

## 2020-02-19 NOTE — Addendum Note (Signed)
Addendum  created 02/19/20 1807 by Roberts Gaudy, MD   Intraprocedure Event edited, Intraprocedure Staff edited

## 2020-03-26 ENCOUNTER — Telehealth: Payer: Self-pay

## 2020-03-26 NOTE — Telephone Encounter (Signed)
Pt is requesting an order be put in for a mammogram at the [facility] center of [place]. I told pt we would call once order was put in, so she would know to go ahead and call.

## 2020-03-27 NOTE — Telephone Encounter (Signed)
Left message on voicemail to call office. Pt does not need an order and she is not due till after November 2nd.

## 2020-04-02 ENCOUNTER — Other Ambulatory Visit: Payer: Self-pay | Admitting: Physician Assistant

## 2020-04-02 DIAGNOSIS — Z1231 Encounter for screening mammogram for malignant neoplasm of breast: Secondary | ICD-10-CM

## 2020-04-30 ENCOUNTER — Other Ambulatory Visit: Payer: Self-pay

## 2020-04-30 ENCOUNTER — Ambulatory Visit
Admission: RE | Admit: 2020-04-30 | Discharge: 2020-04-30 | Disposition: A | Discharge status: Home / Self Care | Payer: 59 | Source: Ambulatory Visit | Attending: Physician Assistant | Admitting: Physician Assistant

## 2020-04-30 DIAGNOSIS — Z1231 Encounter for screening mammogram for malignant neoplasm of breast: Secondary | ICD-10-CM

## 2020-04-30 IMAGING — MG DIGITAL SCREENING BILAT W/ TOMO W/ CAD
8 series · 8 of 24 positions shown · non-contrast
Comparison: Previous exam(s).

CLINICAL DATA: Screening.

EXAM:
DIGITAL SCREENING BILATERAL MAMMOGRAM WITH TOMO AND CAD

[R CC synth-2D]
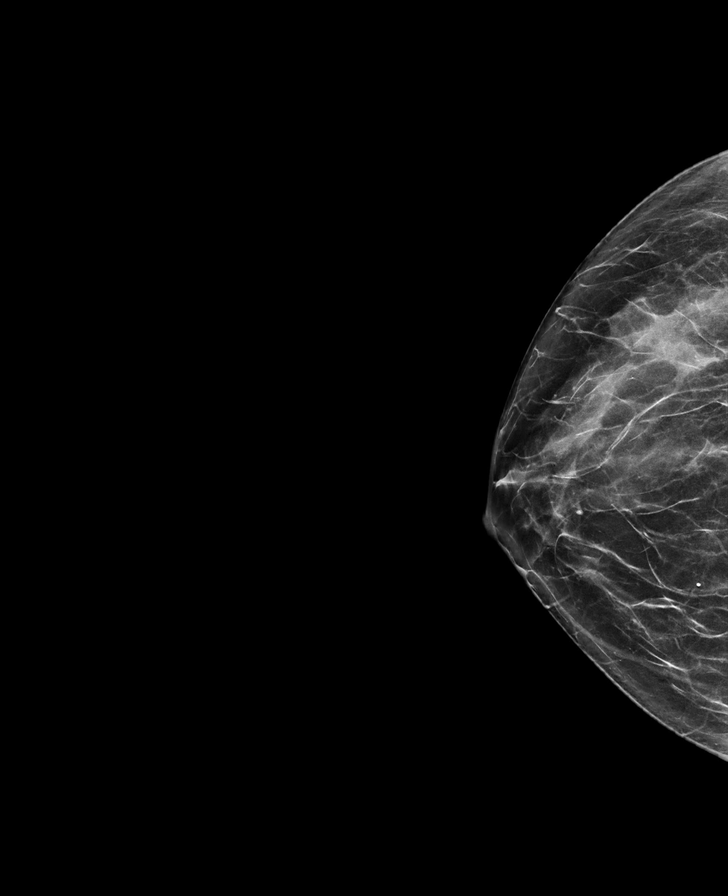

[L CC synth-2D]
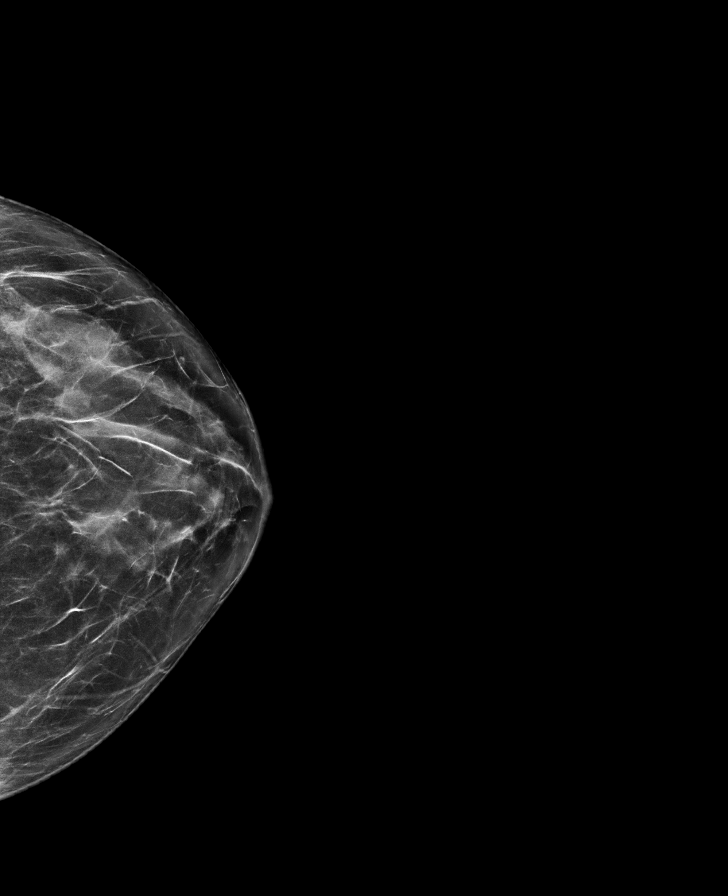

[L MLO synth-2D]
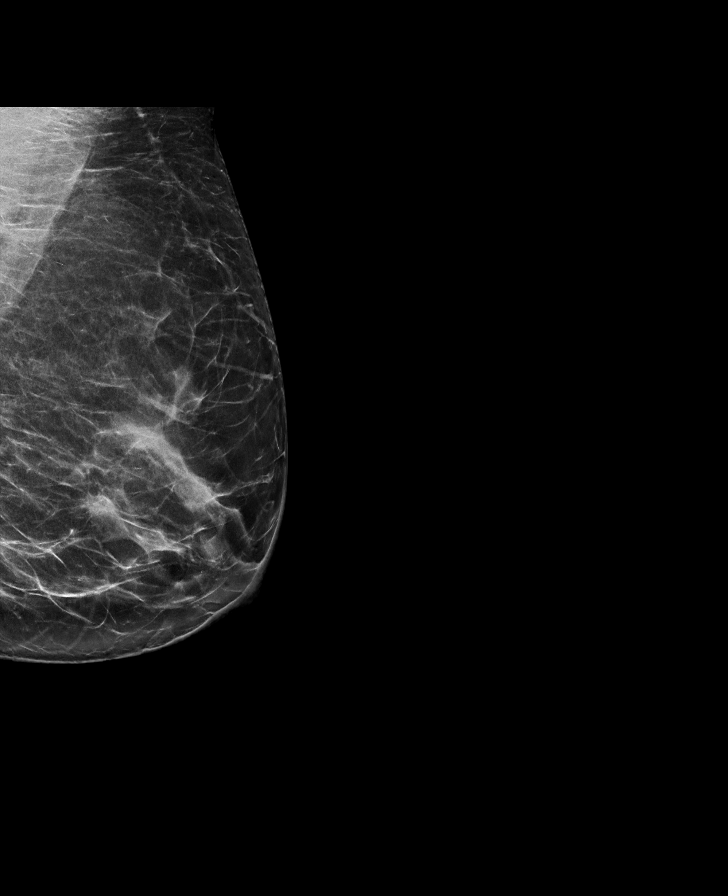

[R MLO synth-2D]
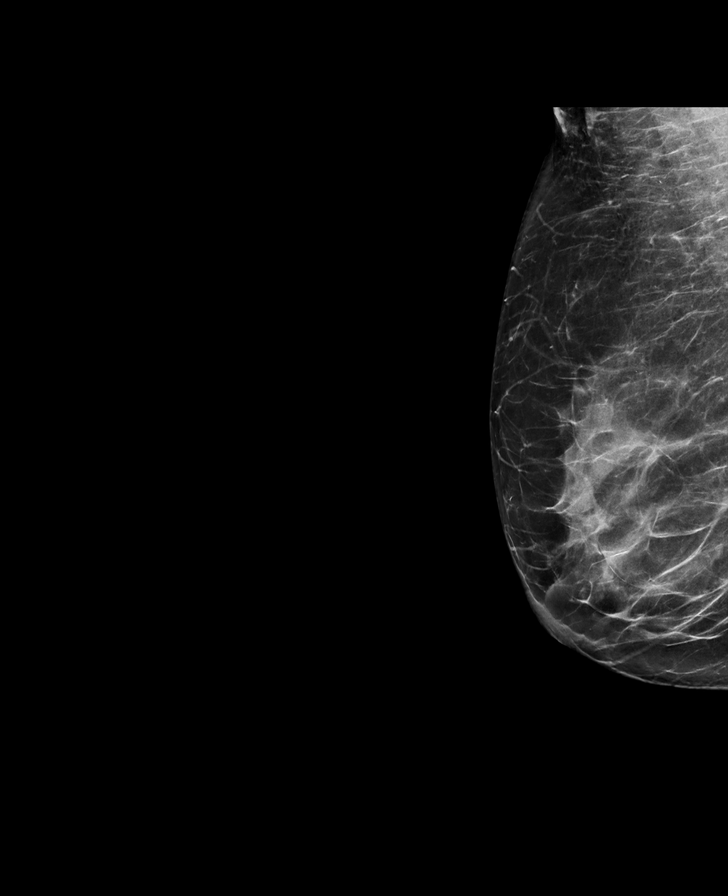

[L MLO tomo · tomo slice 38/75.0]
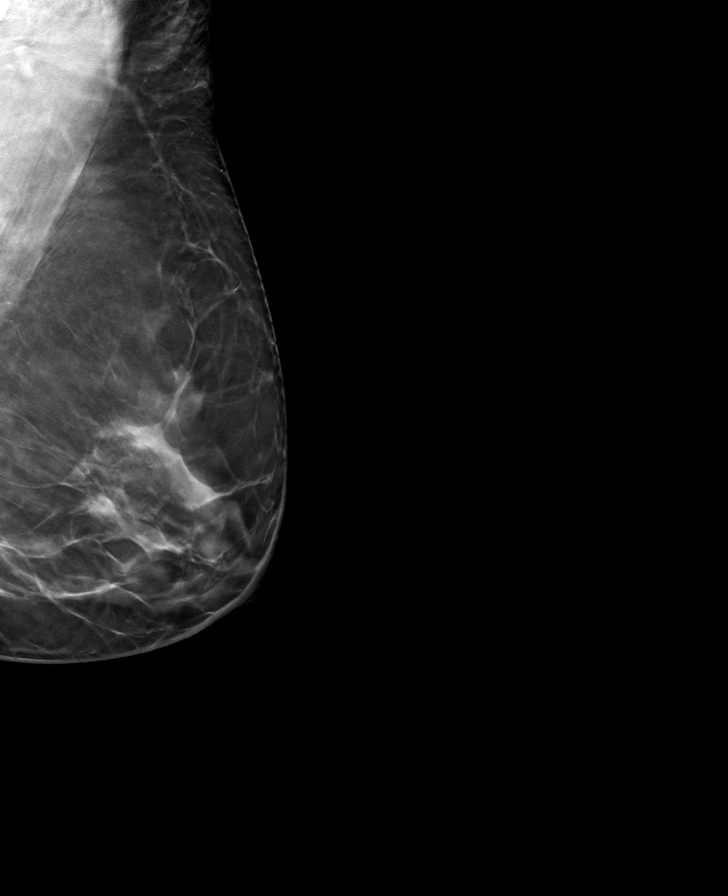

[R MLO tomo · tomo slice 41/82.0]
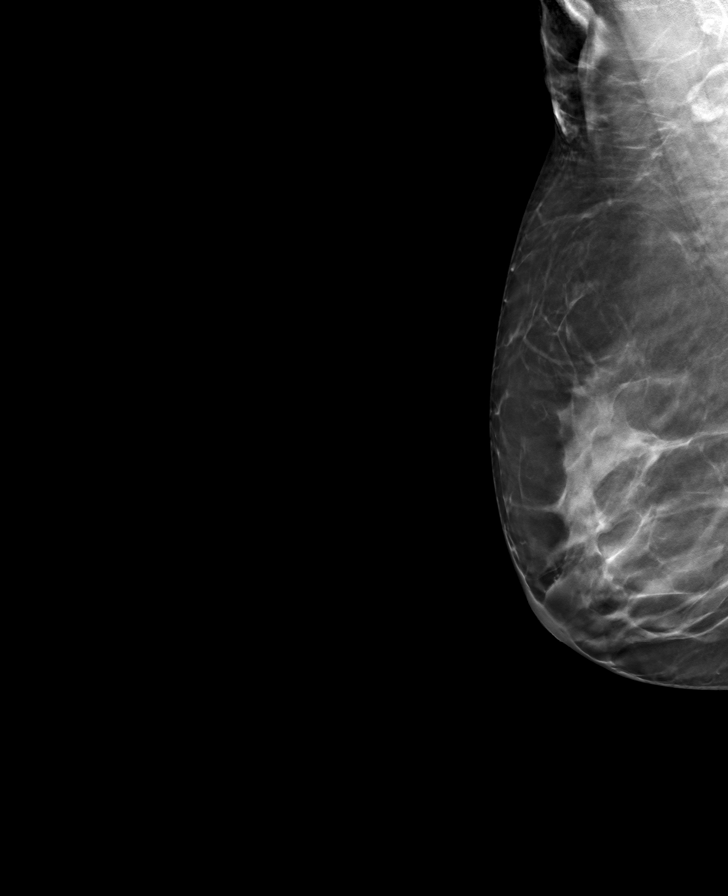

[L CC tomo · tomo slice 36/71.0]
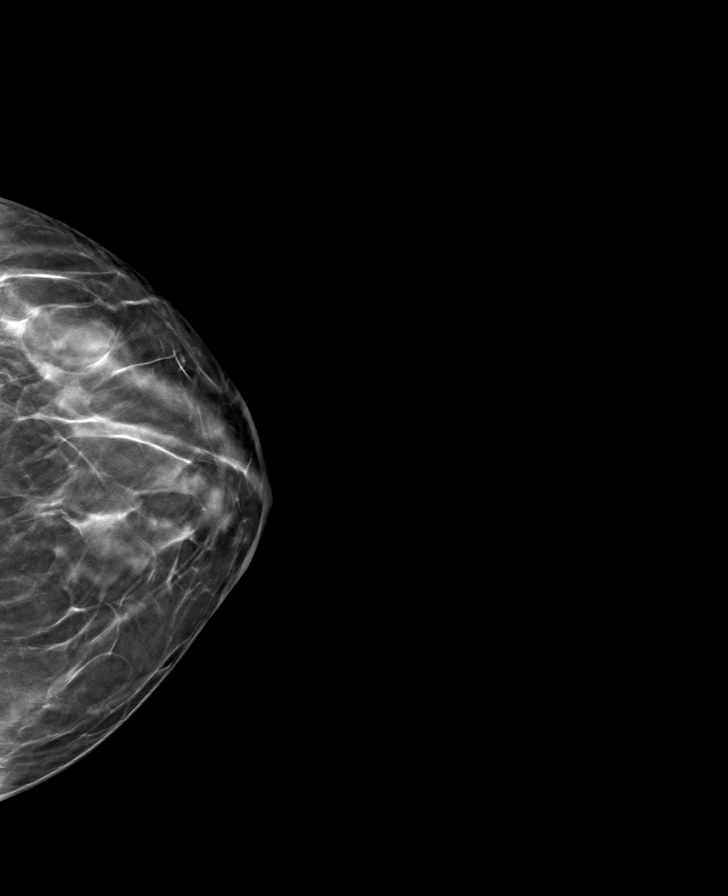

[R CC tomo · tomo slice 35/68.0]
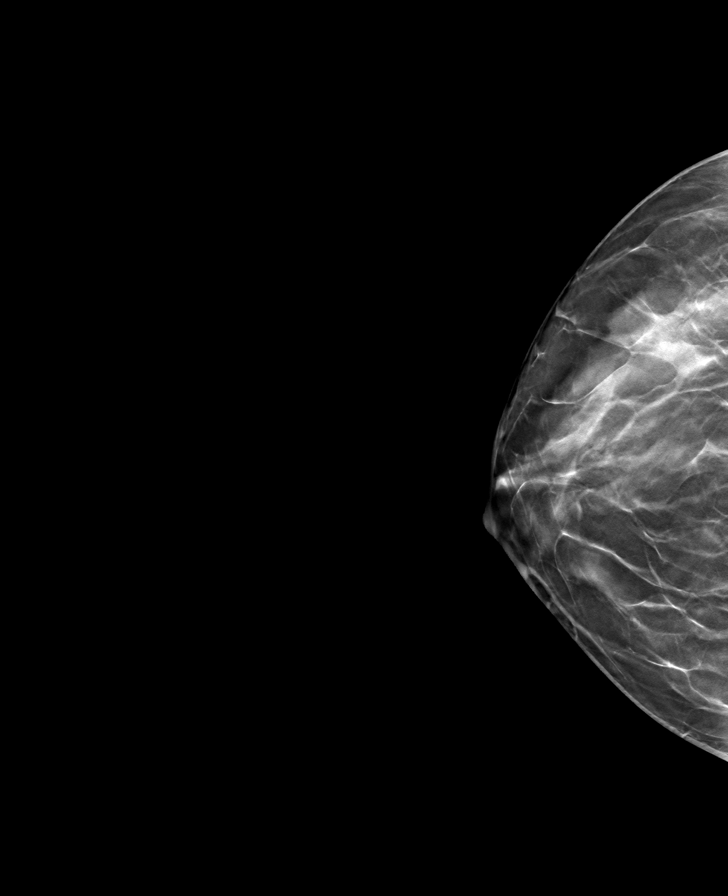

[8 of 24 positions shown; findings below may reference images not displayed]

ACR Breast Density Category c: The breast tissue is heterogeneously
dense, which may obscure small masses.
FINDINGS: There are no findings suspicious for malignancy. Images were
processed with CAD.
IMPRESSION: No mammographic evidence of malignancy. A result letter of this
screening mammogram will be mailed directly to the patient.

RECOMMENDATION:
Screening mammogram in one year. (Code:[5V])

BI-RADS CATEGORY  1: Negative.

## 2020-06-30 ENCOUNTER — Telehealth: Payer: Self-pay

## 2020-06-30 DIAGNOSIS — G9520 Unspecified cord compression: Secondary | ICD-10-CM

## 2020-06-30 DIAGNOSIS — R29898 Other symptoms and signs involving the musculoskeletal system: Secondary | ICD-10-CM

## 2020-06-30 NOTE — Telephone Encounter (Signed)
Pt called asking if Katherine Navarro would put in a new referral for Dr. Windy Carina office. Pt states her insurance is requiring a new referral for her to follow up with him. Please advise.

## 2020-06-30 NOTE — Telephone Encounter (Signed)
Spoke to pt told her referral has been placed for Dr. Saintclair Halsted. Pt verbalized understanding.

## 2020-09-18 ENCOUNTER — Telehealth: Payer: Self-pay

## 2020-09-18 NOTE — Telephone Encounter (Signed)
Please contact pt for a F/U appt before papers from [employer] is filled out per Loanne Drilling.  Thank you,  Leamon Arnt

## 2020-09-19 NOTE — Telephone Encounter (Signed)
LMTCB to schedule f/u appt

## 2020-10-14 ENCOUNTER — Ambulatory Visit: Payer: 59 | Admitting: Endocrinology

## 2020-10-16 ENCOUNTER — Other Ambulatory Visit: Payer: Self-pay

## 2020-10-16 ENCOUNTER — Ambulatory Visit: Payer: 59 | Admitting: Endocrinology

## 2020-10-16 VITALS — BP 140/84 | HR 79 | Ht 63.0 in | Wt 158.4 lb

## 2020-10-16 DIAGNOSIS — E039 Hypothyroidism, unspecified: Secondary | ICD-10-CM | POA: Diagnosis not present

## 2020-10-16 DIAGNOSIS — E1021 Type 1 diabetes mellitus with diabetic nephropathy: Secondary | ICD-10-CM

## 2020-10-16 LAB — POCT GLYCOSYLATED HEMOGLOBIN (HGB A1C): Hemoglobin A1C: 10.8 % — AB (ref 4.0–5.6)

## 2020-10-16 NOTE — Progress Notes (Signed)
Subjective:    Patient ID: Katherine Navarro, female    DOB: 1956/02/10, 65 y.o.   MRN: [id number]  HPI Pt returns for f/u of diabetes mellitus:   DM type: 1  Dx'ed: 1978.   Complications: polyneuropathy, retinopathy and nephropathy.   Therapy: insulin since dx GDM: never DKA: never Severe hypoglycemia: once (1988).  Pancreatitis: never.  Other: on pump rx (now minimed paradigm) since approx 2005; therapy has been limited by noncompliance with cbg recording, and dosing via her pump; pump settings have been adjusted to emphasize basal rate; she sleeps from 3 AM-11 AM, and takes frequent naps during the day.   Interval history: she takes these settings:  basal rate of 0.5 units/hr, 24HRS per day.    mealtime bolus of 1 unit/15 grams CHO  correction bolus (which some people call "sensitivity," or "insulin sensitivity ratio," or just "isr") of 1 unit whenever your blood sugar is over 120.  She is recovering from spinal surgery.   Denies pump problems.  Meter is downloaded today, and the printout is scanned into the record.  cbg varies from 80-400.  It is in general higher as the day goes on, but not necessarily so.   She says she takes synthroid as rx'ed.  Past Medical History:  Diagnosis Date  . Complication of anesthesia    trouble waking up  . CONSTIPATION, CHRONIC 12/13/2007  . DIABETES MELLITUS, TYPE I 01/07/2007  . DM nephropathy/sclerosis   . GLAUCOMA 12/13/2007  . HYPERCHOLESTEROLEMIA 12/13/2007  . Hypertension   . Hyperthyroidism   . INTERNAL HEMORRHOIDS 07/23/2008  . LEUKOPENIA, CHRONIC 12/13/2007  . Nonproliferative diabetic retinopathy WUJ(811.91) 12/13/2007  . Unspecified hypothyroidism 12/13/2007  . VARICOSE VEINS, LOWER EXTREMITIES 12/13/2007    Past Surgical History:  Procedure Laterality Date  . ANTERIOR CERVICAL DECOMP/DISCECTOMY FUSION N/A 02/06/2020   Procedure: Anterior Cervical Decompression Discectomy Fusion Cervical five-six;  Surgeon: Kary Kos, MD;  Location:  [facility];  Service: Neurosurgery;  Laterality: N/A;  . ELECTROCARDIOGRAM  08/24/2006  . EYE SURGERY     bilateral  . LEEP N/A 12/01/2012   Procedure: LOOP ELECTROSURGICAL EXCISION PROCEDURE (LEEP);  Surgeon: Delice Lesch, MD;  Location: [facility];  Service: Gynecology;  Laterality: N/A;  . Stress Myoview   05/18/2004  . TUBAL LIGATION      Social History   Socioeconomic History  . Marital status: Married    Spouse name: Not on file  . Number of children: Not on file  . Years of education: Not on file  . Highest education level: Not on file  Occupational History    Comment: Doesn't work outside the home  Tobacco Use  . Smoking status: Never Smoker  . Smokeless tobacco: Never Used  Vaping Use  . Vaping Use: Never used  Substance and Sexual Activity  . Alcohol use: Yes    Alcohol/week: 1.0 standard drink    Types: 1 Glasses of wine per week    Comment: occasional  . Drug use: No  . Sexual activity: Not on file  Other Topics Concern  . Not on file  Social History Narrative   Pt gets regular exercise.         Social Determinants of Health   Financial Resource Strain: Not on file  Food Insecurity: Not on file  Transportation Needs: Not on file  Physical Activity: Not on file  Stress: Not on file  Social Connections: Not on file  Intimate Partner Violence: Not on file  Current Outpatient Medications on File Prior to Visit  Medication Sig Dispense Refill  . brimonidine (ALPHAGAN P) 0.1 % SOLN Place 1 drop into both eyes daily as needed (red/irritated eyes).    . cyclobenzaprine (FLEXERIL) 10 MG tablet Take 1 tablet (10 mg total) by mouth 3 (three) times daily as needed for muscle spasms. 30 tablet 0  . dorzolamide-timolol (COSOPT) 22.3-6.8 MG/ML ophthalmic solution Place 1 drop into both eyes daily.     Marland Kitchen glucose blood (ONETOUCH ULTRA) test strip 1 each by Other route 4 (four) times daily. And lancets 4/day 360 each 3  . HYDROcodone-acetaminophen (NORCO/VICODIN) 5-325 MG  tablet Take 1 tablet by mouth every 4 (four) hours as needed for moderate pain. 20 tablet 0  . Insulin Infusion Pump Supplies (MINIMED INFUSION SET-MMT 396) MISC 1 Device by Other route every 3 (three) days. 30 each 3  . Insulin Infusion Pump Supplies (PARADIGM RESERVOIR 3ML) MISC 1 Device by Other route every 3 (three) days. 30 each 3  . insulin lispro (HUMALOG) 100 UNIT/ML injection Inject 0.5 mLs (50 Units total) into the skin See admin instructions. 50 UNITS DAILY VIA PUMP 40 mL 3  . levothyroxine (SYNTHROID) 112 MCG tablet Take 1 tablet (112 mcg total) by mouth daily before breakfast. 90 tablet 3  . amiloride-hydrochlorothiazide (MODURETIC) 5-50 MG tablet Take 0.5 tablets by mouth daily. 45 tablet 2   No current facility-administered medications on file prior to visit.    Allergies  Allergen Reactions  . Atorvastatin     REACTION: perceived myalgias  . Ciprofloxacin Nausea And Vomiting  . Epinephrine     REACTION: thyroid problems  . Erythromycin     Can not recall  . Morphine Nausea Only    Headache  . Penicillins Hives    Family History  Problem Relation Age of Onset  . Cancer Mother        Breast Cancer  . Diabetes Mother   . Breast cancer Mother   . Diabetes Father     BP 140/84 (BP Location: Right Arm, Patient Position: Sitting, Cuff Size: Normal)   Pulse 79   Ht 5\' 3"  (1.6 m)   Wt 158 lb 6.4 oz (71.8 kg)   LMP 05/03/2013   SpO2 99%   BMI 28.06 kg/m    Review of Systems     Objective:   Physical Exam VITAL SIGNS:  See vs page GENERAL: no distress.  In wheelchair Pulses: dorsalis pedis absent bilat.   MSK: no deformity of the feet CV: no leg edema Skin:  no ulcer on the feet.  normal temp on the feet, but the feet are hyperpigmented.   Neuro: sensation is intact to touch on the feet, but decreased from normal.    Lab Results  Component Value Date   HGBA1C 10.8 (A) 10/16/2020      Assessment & Plan:  Type 1 DM: uncontrolled.   Hypothyroidism:  recheck today.   Patient Instructions  A thyroid blood test is requested for you today.  We'll let you know about the results. please take these settings: basal rate of 0.5 units/hr.    mealtime bolus of 1 unit/15 grams  correction bolus (which some people call "sensitivity," or "insulin sensitivity ratio," or just "isr") of 1 unit whenever your blood sugar is over 120.  You can do as often as every hour if necessary.   If you are going to be active, suspend the pump for 1-2 hrs.  check your blood sugar 4  times a day: the 3 meals, and at bedtime.  also check if you have symptoms of your blood sugar being too high or too low.  please keep a record of the readings and bring it to your next appointment here (or you can bring the meter itself).  You can write it on any piece of paper.  please call us sooner if your blood sugar goes below 70, or if you have a lot of readings over 200.  Please let me know if you want to be referred to a pump trainer to start on your new pump. Please consider rather than taking a mealtime bolus, take 2 basals (1 while asleep, and a higher one while awake)   Please come back for a follow-up appointment in 2 months.

## 2020-10-16 NOTE — Patient Instructions (Addendum)
A thyroid blood test is requested for you today.  We'll let you know about the results. please take these settings: basal rate of 0.5 units/hr.    mealtime bolus of 1 unit/15 grams  correction bolus (which some people call "sensitivity," or "insulin sensitivity ratio," or just "isr") of 1 unit whenever your blood sugar is over 120.  You can do as often as every hour if necessary.   If you are going to be active, suspend the pump for 1-2 hrs.  check your blood sugar 4 times a day: the 3 meals, and at bedtime.  also check if you have symptoms of your blood sugar being too high or too low.  please keep a record of the readings and bring it to your next appointment here (or you can bring the meter itself).  You can write it on any piece of paper.  please call us sooner if your blood sugar goes below 70, or if you have a lot of readings over 200.  Please let me know if you want to be referred to a pump trainer to start on your new pump. Please consider rather than taking a mealtime bolus, take 2 basals (1 while asleep, and a higher one while awake)   Please come back for a follow-up appointment in 2 months.

## 2020-10-17 LAB — BASIC METABOLIC PANEL
BUN: 20 mg/dL (ref 6–23)
CO2: 28 mEq/L (ref 19–32)
Calcium: 11 mg/dL — ABNORMAL HIGH (ref 8.4–10.5)
Chloride: 93 mEq/L — ABNORMAL LOW (ref 96–112)
Creatinine, Ser: 1.21 mg/dL — ABNORMAL HIGH (ref 0.40–1.20)
GFR: 47.81 mL/min — ABNORMAL LOW (ref >60.00)
Glucose, Bld: 306 mg/dL — ABNORMAL HIGH (ref 70–99)
Potassium: 4.9 mEq/L (ref 3.5–5.1)
Sodium: 131 mEq/L — ABNORMAL LOW (ref 135–145)

## 2020-10-17 LAB — TSH: TSH: 7.15 u[IU]/mL — ABNORMAL HIGH (ref 0.35–4.50)

## 2020-10-17 LAB — T4, FREE: Free T4: 0.74 ng/dL (ref 0.60–1.60)

## 2020-10-20 ENCOUNTER — Telehealth: Payer: Self-pay | Admitting: Endocrinology

## 2020-10-20 NOTE — Telephone Encounter (Signed)
Patient said when she was here on 10/16/20, Dr Loanne Drilling said he was going to call in a medication for her to pick up but she does not remember name of medication, and I do not see it on the AVS. Patient said there was nothing that was called into her pharmacy. Ph# [phone] (patient gives consent to leave a detailed message) patient also wanted to speak to someone regarding her lab results.

## 2020-10-20 NOTE — Telephone Encounter (Signed)
We discussed the possibility of changing pump settings (offer to change is still good), but I am unaware of a new med.

## 2020-10-27 ENCOUNTER — Ambulatory Visit: Payer: 59 | Admitting: Endocrinology

## 2020-10-31 ENCOUNTER — Telehealth: Payer: Self-pay

## 2020-10-31 NOTE — Telephone Encounter (Signed)
Fax to [employer] completed and faxed

## 2020-11-03 NOTE — Telephone Encounter (Signed)
[employer] called to advise that the most recent visit notes still have not been received - please sent to [phone]

## 2020-11-04 NOTE — Telephone Encounter (Signed)
Pt called to F/u on this message. On this matter.

## 2020-11-05 NOTE — Telephone Encounter (Signed)
Forms have been refaxed to [employer].

## 2020-12-18 ENCOUNTER — Other Ambulatory Visit: Payer: Self-pay

## 2020-12-18 ENCOUNTER — Ambulatory Visit (INDEPENDENT_AMBULATORY_CARE_PROVIDER_SITE_OTHER): Payer: 59 | Admitting: Endocrinology

## 2020-12-18 VITALS — BP 118/74 | HR 76 | Ht 63.0 in | Wt 158.0 lb

## 2020-12-18 DIAGNOSIS — E1021 Type 1 diabetes mellitus with diabetic nephropathy: Secondary | ICD-10-CM

## 2020-12-18 LAB — POCT GLYCOSYLATED HEMOGLOBIN (HGB A1C): Hemoglobin A1C: 11.1 % — AB (ref 4.0–5.6)

## 2020-12-18 MED ORDER — ACCU-CHEK GUIDE VI STRP: 1.0000 | ORAL_STRIP | 3 refills | Status: DC

## 2020-12-18 MED ORDER — HUMALOG 100 UNIT/ML ~~LOC~~ SOLN: SUBCUTANEOUS | 3 refills | Status: DC

## 2020-12-18 MED ORDER — "PARADIGM QUICK-SET 43"" 9MM MISC": 1.0000 | 3 refills | Status: DC

## 2020-12-18 MED ORDER — PARADIGM PUMP RESERVOIR 3ML MISC: 1.0000 | 3 refills | Status: DC

## 2020-12-18 NOTE — Patient Instructions (Addendum)
A thyroid blood test is requested for you today.  We'll let you know about the results. please take these settings: basal rate of 0.6 units/hr.    mealtime bolus of 1 unit/15 grams.   correction bolus (which some people call "sensitivity," or "insulin sensitivity ratio," or just "isr") of 1 unit whenever your blood sugar is over 120.  You can do as often as every hour if necessary.   If you are going to be active, suspend the pump for 1-2 hrs.  check your blood sugar 4 times a day: the 3 meals, and at bedtime.  also check if you have symptoms of your blood sugar being too high or too low.  please keep a record of the readings and bring it to your next appointment here (or you can bring the meter itself).  You can write it on any piece of paper.  please call us sooner if your blood sugar goes below 70, or if you have a lot of readings over 200.  Please let me know if you want to be referred to a pump trainer to start on your new pump.  Blood tests are requested for you today.  We'll let you know about the results.  Please come back for a follow-up appointment in 2 months.

## 2020-12-18 NOTE — Progress Notes (Signed)
Subjective:    Patient ID: Katherine Navarro, female    DOB: 02-08-1956, 65 y.o.   MRN: [id number]  HPI Pt returns for f/u of diabetes mellitus:   DM type: 1  Dx'ed: 1978.   Complications: polyneuropathy, retinopathy and nephropathy.   Therapy: insulin since dx GDM: never DKA: never Severe hypoglycemia: once (1988).  Pancreatitis: never.  Other: on pump rx (now minimed paradigm) since approx 2005; therapy has been limited by noncompliance with cbg recording, and dosing via her pump; pump settings have been adjusted to emphasize basal rate; she sleeps from 4AM-12N, and takes frequent naps during the day.   Interval history: she takes these settings:  basal rate of 0.5 units/hr.   mealtime bolus of 1 unit/15 grams.   correction bolus (which some people call "sensitivity," or "insulin sensitivity ratio," or just "isr") of 1 unit whenever your blood sugar is over 120.  You can do as often as every hour if necessary.   If you are going to be active, suspend the pump for 1-2 hrs.   TDD is approx 25 units.  Meter is downloaded today, and the printout is scanned into the record.  cbg varies from 175-400.  It is in general higher as the day goes on, but not necessarily so.  She takes 4-5 boluses per day.  She has received a new 770G pump.  She seldom has hypoglycemia, and these episodes are mild.   She says she never misses synthroid.   Past Medical History:  Diagnosis Date   Complication of anesthesia    trouble waking up   CONSTIPATION, CHRONIC 12/13/2007   DIABETES MELLITUS, TYPE I 01/07/2007   DM nephropathy/sclerosis    GLAUCOMA 12/13/2007   HYPERCHOLESTEROLEMIA 12/13/2007   Hypertension    Hyperthyroidism    INTERNAL HEMORRHOIDS 07/23/2008   LEUKOPENIA, CHRONIC 12/13/2007   Nonproliferative diabetic retinopathy NOS(362.03) 12/13/2007   Unspecified hypothyroidism 12/13/2007   VARICOSE VEINS, LOWER EXTREMITIES 12/13/2007    Past Surgical History:  Procedure Laterality Date   ANTERIOR  CERVICAL DECOMP/DISCECTOMY FUSION N/A 02/06/2020   Procedure: Anterior Cervical Decompression Discectomy Fusion Cervical five-six;  Surgeon: Kary Kos, MD;  Location: [facility];  Service: Neurosurgery;  Laterality: N/A;   ELECTROCARDIOGRAM  08/24/2006   EYE SURGERY     bilateral   LEEP N/A 12/01/2012   Procedure: LOOP ELECTROSURGICAL EXCISION PROCEDURE (LEEP);  Surgeon: Delice Lesch, MD;  Location: [facility];  Service: Gynecology;  Laterality: N/A;   Stress Myoview   05/18/2004   TUBAL LIGATION      Social History   Socioeconomic History   Marital status: Married    Spouse name: Not on file   Number of children: Not on file   Years of education: Not on file   Highest education level: Not on file  Occupational History    Comment: Doesn't work outside the home  Tobacco Use   Smoking status: Never   Smokeless tobacco: Never  Vaping Use   Vaping Use: Never used  Substance and Sexual Activity   Alcohol use: Yes    Alcohol/week: 1.0 standard drink    Types: 1 Glasses of wine per week    Comment: occasional   Drug use: No   Sexual activity: Not on file  Other Topics Concern   Not on file  Social History Narrative   Pt gets regular exercise.         Social Determinants of Health   Financial Resource Strain: Not on file  Food Insecurity: Not on file  Transportation Needs: Not on file  Physical Activity: Not on file  Stress: Not on file  Social Connections: Not on file  Intimate Partner Violence: Not on file    Current Outpatient Medications on File Prior to Visit  Medication Sig Dispense Refill   brimonidine (ALPHAGAN P) 0.1 % SOLN Place 1 drop into both eyes daily as needed (red/irritated eyes).     cyclobenzaprine (FLEXERIL) 10 MG tablet Take 1 tablet (10 mg total) by mouth 3 (three) times daily as needed for muscle spasms. 30 tablet 0   dorzolamide-timolol (COSOPT) 22.3-6.8 MG/ML ophthalmic solution Place 1 drop into both eyes daily.      HYDROcodone-acetaminophen  (NORCO/VICODIN) 5-325 MG tablet Take 1 tablet by mouth every 4 (four) hours as needed for moderate pain. 20 tablet 0   levothyroxine (SYNTHROID) 112 MCG tablet Take 1 tablet (112 mcg total) by mouth daily before breakfast. 90 tablet 3   amiloride-hydrochlorothiazide (MODURETIC) 5-50 MG tablet Take 0.5 tablets by mouth daily. 45 tablet 2   No current facility-administered medications on file prior to visit.    Allergies  Allergen Reactions   Atorvastatin     REACTION: perceived myalgias   Ciprofloxacin Nausea And Vomiting   Epinephrine     REACTION: thyroid problems   Erythromycin     Can not recall   Morphine Nausea Only    Headache   Penicillins Hives    Family History  Problem Relation Age of Onset   Cancer Mother        Breast Cancer   Diabetes Mother    Breast cancer Mother    Diabetes Father     BP 118/74 (BP Location: Right Arm, Patient Position: Sitting, Cuff Size: Large)   Pulse 76   Ht 5\' 3"  (1.6 m)   Wt 158 lb (71.7 kg)   LMP 05/03/2013   SpO2 99%   BMI 27.99 kg/m    Review of Systems     Objective:   Physical Exam  Lab Results  Component Value Date   HGBA1C 11.1 (A) 12/18/2020       Assessment & Plan:  Type 1 DM: uncontrolled.  She declines continuous glucose monitor, at least for now.   Hypothyroidism: recheck today.   Patient Instructions  A thyroid blood test is requested for you today.  We'll let you know about the results. please take these settings: basal rate of 0.6 units/hr.    mealtime bolus of 1 unit/15 grams.   correction bolus (which some people call "sensitivity," or "insulin sensitivity ratio," or just "isr") of 1 unit whenever your blood sugar is over 120.  You can do as often as every hour if necessary.   If you are going to be active, suspend the pump for 1-2 hrs.  check your blood sugar 4 times a day: the 3 meals, and at bedtime.  also check if you have symptoms of your blood sugar being too high or too low.  please keep a  record of the readings and bring it to your next appointment here (or you can bring the meter itself).  You can write it on any piece of paper.  please call us sooner if your blood sugar goes below 70, or if you have a lot of readings over 200.  Please let me know if you want to be referred to a pump trainer to start on your new pump.  Blood tests are requested for you today.  We'll  let you know about the results.  Please come back for a follow-up appointment in 2 months.

## 2020-12-19 LAB — T4, FREE: Free T4: 1.14 ng/dL (ref 0.60–1.60)

## 2020-12-19 LAB — TSH: TSH: 1.51 u[IU]/mL (ref 0.35–5.50)

## 2020-12-23 ENCOUNTER — Other Ambulatory Visit: Payer: Self-pay | Admitting: Endocrinology

## 2020-12-23 DIAGNOSIS — E1021 Type 1 diabetes mellitus with diabetic nephropathy: Secondary | ICD-10-CM

## 2020-12-24 ENCOUNTER — Telehealth: Payer: Self-pay | Admitting: Pharmacy Technician

## 2020-12-24 NOTE — Telephone Encounter (Signed)
Patient Advocate Encounter   I did not submit request, however received notification from [facility] that Paradigm Quick-set Infusion Set 43'/59mm cannula is not covered by plan.  Katherine Navarro, CPhT Patient Advocate [facility] Phone: [phone] Fax:  [phone]

## 2020-12-24 NOTE — Telephone Encounter (Addendum)
Patient Advocate Encounter   Received notification from [facility] that prior authorization for ACCU-CHEK STRIPS is required.   PA submitted on 12/24/2020 Key [id number] Status is APPROVED  [id number] through 12/23/2021    [facility] will continue to follow.   Venida Jarvis. Nadara Mustard, CPhT Patient Advocate [facility] Phone: [phone] Fax:  [phone]

## 2021-01-06 ENCOUNTER — Telehealth: Payer: Self-pay | Admitting: Endocrinology

## 2021-01-06 NOTE — Telephone Encounter (Signed)
REFILL REQUEST -   Levothyroxine 90 day supply  [place], [address] Phone:  [phone]  Fax:  [phone]

## 2021-01-07 ENCOUNTER — Telehealth: Payer: Self-pay | Admitting: Pharmacy Technician

## 2021-01-07 NOTE — Telephone Encounter (Signed)
Patient Advocate Encounter   Left a HIPPA compliant voicemail for patient to call back.  Per Darlina Rumpf cma, when she calls back, please tell her that to get her pump infusion supplies, she must call [facility] at [phone] and tell them all that she needs.   They will fax the clinic all the paperwork needed to get them filled.    [facility] will continue to follow.   Venida Jarvis. Nadara Mustard, CPhT Patient Advocate [facility] Phone: [phone] Fax:  [phone]

## 2021-01-07 NOTE — Telephone Encounter (Signed)
Pt called to check the status of her refill request. She requests a call whenever this has been completed.

## 2021-01-08 ENCOUNTER — Other Ambulatory Visit: Payer: Self-pay

## 2021-01-08 DIAGNOSIS — E1021 Type 1 diabetes mellitus with diabetic nephropathy: Secondary | ICD-10-CM

## 2021-01-08 MED ORDER — LEVOTHYROXINE SODIUM 112 MCG PO TABS: 112.0000 ug | ORAL_TABLET | Freq: Every day | ORAL | 3 refills | Status: DC

## 2021-01-08 NOTE — Telephone Encounter (Signed)
Sent to pharmacy 

## 2021-03-19 ENCOUNTER — Other Ambulatory Visit: Payer: Self-pay

## 2021-03-19 ENCOUNTER — Ambulatory Visit (INDEPENDENT_AMBULATORY_CARE_PROVIDER_SITE_OTHER): Payer: 59 | Admitting: Endocrinology

## 2021-03-19 VITALS — BP 128/80 | HR 79 | Ht 63.0 in | Wt 160.8 lb

## 2021-03-19 DIAGNOSIS — E1021 Type 1 diabetes mellitus with diabetic nephropathy: Secondary | ICD-10-CM

## 2021-03-19 LAB — POCT GLYCOSYLATED HEMOGLOBIN (HGB A1C): Hemoglobin A1C: 10 % — AB (ref 4.0–5.6)

## 2021-03-19 MED ORDER — LEVOTHYROXINE SODIUM 112 MCG PO TABS: 112.0000 ug | ORAL_TABLET | Freq: Every day | ORAL | 3 refills | Status: DC

## 2021-03-19 MED ORDER — "PARADIGM QUICK-SET 43"" 9MM MISC": 1.0000 | 3 refills | Status: DC

## 2021-03-19 MED ORDER — ACCU-CHEK GUIDE VI STRP: 1.0000 | ORAL_STRIP | 3 refills | Status: DC

## 2021-03-19 NOTE — Progress Notes (Signed)
Subjective:    Patient ID: Katherine Navarro, female    DOB: January 30, 1956, 65 y.o.   MRN: [id number]  HPI Pt returns for f/u of diabetes mellitus:   DM type: 1  Dx'ed: 1978.   Complications: polyneuropathy, retinopathy and nephropathy.   Therapy: insulin since dx GDM: never DKA: never Severe hypoglycemia: once (1988).  Pancreatitis: never.  Other: on pump rx (now minimed paradigm) since approx 2005; therapy has been limited by noncompliance with cbg recording, and dosing via her pump; pump settings have been adjusted to emphasize basal rate; she sleeps from 4AM-12N, and takes frequent naps during the day.   Interval history: she takes these settings.   basal rate of 0.6 units/hr.    mealtime bolus of 1 unit/15 grams.   correction bolus (which some people call "sensitivity," or "insulin sensitivity ratio," or just "isr") of 1 unit whenever your blood sugar is over 120.  You can do as often as every hour if necessary.   If you are going to be active, suspend the pump for 1-2 hrs.  We are able to download neither the pump or meter.  She has obtained the new pump, but has not yet started using.  She says she cannot afford continuous glucose monitor.  She says she never misses synthroid.   Past Medical History:  Diagnosis Date   Complication of anesthesia    trouble waking up   CONSTIPATION, CHRONIC 12/13/2007   DIABETES MELLITUS, TYPE I 01/07/2007   DM nephropathy/sclerosis    GLAUCOMA 12/13/2007   HYPERCHOLESTEROLEMIA 12/13/2007   Hypertension    Hyperthyroidism    INTERNAL HEMORRHOIDS 07/23/2008   LEUKOPENIA, CHRONIC 12/13/2007   Nonproliferative diabetic retinopathy NOS(362.03) 12/13/2007   Unspecified hypothyroidism 12/13/2007   VARICOSE VEINS, LOWER EXTREMITIES 12/13/2007    Past Surgical History:  Procedure Laterality Date   ANTERIOR CERVICAL DECOMP/DISCECTOMY FUSION N/A 02/06/2020   Procedure: Anterior Cervical Decompression Discectomy Fusion Cervical five-six;  Surgeon: Kary Kos,  MD;  Location: [facility];  Service: Neurosurgery;  Laterality: N/A;   ELECTROCARDIOGRAM  08/24/2006   EYE SURGERY     bilateral   LEEP N/A 12/01/2012   Procedure: LOOP ELECTROSURGICAL EXCISION PROCEDURE (LEEP);  Surgeon: Delice Lesch, MD;  Location: [facility];  Service: Gynecology;  Laterality: N/A;   Stress Myoview   05/18/2004   TUBAL LIGATION      Social History   Socioeconomic History   Marital status: Married    Spouse name: Not on file   Number of children: Not on file   Years of education: Not on file   Highest education level: Not on file  Occupational History    Comment: Doesn't work outside the home  Tobacco Use   Smoking status: Never   Smokeless tobacco: Never  Vaping Use   Vaping Use: Never used  Substance and Sexual Activity   Alcohol use: Yes    Alcohol/week: 1.0 standard drink    Types: 1 Glasses of wine per week    Comment: occasional   Drug use: No   Sexual activity: Not on file  Other Topics Concern   Not on file  Social History Narrative   Pt gets regular exercise.         Social Determinants of Health   Financial Resource Strain: Not on file  Food Insecurity: Not on file  Transportation Needs: Not on file  Physical Activity: Not on file  Stress: Not on file  Social Connections: Not on file  Intimate Partner  Violence: Not on file    Current Outpatient Medications on File Prior to Visit  Medication Sig Dispense Refill   brimonidine (ALPHAGAN P) 0.1 % SOLN Place 1 drop into both eyes daily as needed (red/irritated eyes).     cyclobenzaprine (FLEXERIL) 10 MG tablet Take 1 tablet (10 mg total) by mouth 3 (three) times daily as needed for muscle spasms. 30 tablet 0   dorzolamide-timolol (COSOPT) 22.3-6.8 MG/ML ophthalmic solution Place 1 drop into both eyes daily.      Insulin Infusion Pump Supplies (PARADIGM RESERVOIR 3ML) MISC 1 Device by Other route every other day. 45 each 3   insulin lispro (HUMALOG) 100 UNIT/ML injection Via pump, total of 50  units per day 50 mL 3   amiloride-hydrochlorothiazide (MODURETIC) 5-50 MG tablet Take 0.5 tablets by mouth daily. 45 tablet 2   No current facility-administered medications on file prior to visit.    Allergies  Allergen Reactions   Atorvastatin     REACTION: perceived myalgias   Ciprofloxacin Nausea And Vomiting   Epinephrine     REACTION: thyroid problems   Erythromycin     Can not recall   Morphine Nausea Only    Headache   Penicillins Hives    Family History  Problem Relation Age of Onset   Cancer Mother        Breast Cancer   Diabetes Mother    Breast cancer Mother    Diabetes Father     BP 128/80 (BP Location: Right Arm, Patient Position: Sitting, Cuff Size: Normal)   Pulse 79   Ht 5\' 3"  (1.6 m)   Wt 160 lb 12.8 oz (72.9 kg)   LMP 05/03/2013   SpO2 97%   BMI 28.48 kg/m    Review of Systems She says she seldom has hypoglycemia, and these episodes are mild.       Objective:   Physical Exam VITAL SIGNS:  See vs page GENERAL: no distress.  In WC Pulses: dorsalis pedis absent bilat.   MSK: no deformity of the feet CV: no leg edema Skin:  no ulcer on the feet.  normal temp on the feet, but the feet are hyperpigmented.   Neuro: sensation is intact to touch on the feet, but decreased from normal.    A1c=10.0%  Lab Results  Component Value Date   TSH 1.51 12/18/2020      Assessment & Plan:  Type 1 DM: severe exacerbation.  I advised her to start using her new pump, and to get a continuous glucose monitor, as she needs to carefully monitor glucose for hypoglycemia.  Pt says she does not need any help starting to use her new pump.  She declines sooner f/u.   Patient Instructions  please take these settings: basal rate of 0.6 units/hr.    mealtime bolus of 1 unit/15 grams.   correction bolus (which some people call "sensitivity," or "insulin sensitivity ratio," or just "isr") of 1 unit whenever your blood sugar is over 120.  You can do as often as every hour  if necessary.   If you are going to be active, suspend the pump for 1-2 hrs.  check your blood sugar 4 times a day: the 3 meals, and at bedtime.  also check if you have symptoms of your blood sugar being too high or too low.  please keep a record of the readings and bring it to your next appointment here (or you can bring the meter itself).  You can write it  on any piece of paper.  please call us sooner if your blood sugar goes below 70, or if you have a lot of readings over 200.  please change over to the new pump.  Please let me know if you want to be referred to a pump trainer, for help.   Please come back for a follow-up appointment in January

## 2021-03-19 NOTE — Patient Instructions (Addendum)
please take these settings: basal rate of 0.6 units/hr.    mealtime bolus of 1 unit/15 grams.   correction bolus (which some people call "sensitivity," or "insulin sensitivity ratio," or just "isr") of 1 unit whenever your blood sugar is over 120.  You can do as often as every hour if necessary.   If you are going to be active, suspend the pump for 1-2 hrs.  check your blood sugar 4 times a day: the 3 meals, and at bedtime.  also check if you have symptoms of your blood sugar being too high or too low.  please keep a record of the readings and bring it to your next appointment here (or you can bring the meter itself).  You can write it on any piece of paper.  please call us sooner if your blood sugar goes below 70, or if you have a lot of readings over 200.  please change over to the new pump.  Please let me know if you want to be referred to a pump trainer, for help.   Please come back for a follow-up appointment in January

## 2021-03-26 ENCOUNTER — Ambulatory Visit: Payer: 59 | Admitting: Endocrinology

## 2021-06-23 ENCOUNTER — Other Ambulatory Visit: Payer: Self-pay

## 2021-06-23 DIAGNOSIS — E1021 Type 1 diabetes mellitus with diabetic nephropathy: Secondary | ICD-10-CM

## 2021-06-23 MED ORDER — ACCU-CHEK SOFTCLIX LANCETS MISC: 12 refills | Status: DC

## 2021-07-16 ENCOUNTER — Ambulatory Visit: Payer: 59 | Admitting: Endocrinology

## 2021-12-14 ENCOUNTER — Other Ambulatory Visit: Payer: Self-pay

## 2021-12-14 DIAGNOSIS — E039 Hypothyroidism, unspecified: Secondary | ICD-10-CM

## 2021-12-14 DIAGNOSIS — E1021 Type 1 diabetes mellitus with diabetic nephropathy: Secondary | ICD-10-CM

## 2023-01-31 ENCOUNTER — Telehealth: Payer: Self-pay | Admitting: Physician Assistant

## 2023-01-31 NOTE — Telephone Encounter (Signed)
Called patient and no answer left VM to call office to schedule next available CPE

## 2023-04-22 ENCOUNTER — Encounter (HOSPITAL_COMMUNITY): Payer: Self-pay

## 2023-04-22 ENCOUNTER — Emergency Department (HOSPITAL_COMMUNITY): Payer: Medicare HMO

## 2023-04-22 ENCOUNTER — Inpatient Hospital Stay (HOSPITAL_COMMUNITY)
Admission: EM | Admit: 2023-04-22 | Discharge: 2023-05-20 | DRG: 080 | Disposition: A | Discharge status: Skilled Nursing Facility | Payer: Medicare HMO | Attending: Internal Medicine | Admitting: Internal Medicine

## 2023-04-22 DIAGNOSIS — N1831 Chronic kidney disease, stage 3a: Secondary | ICD-10-CM | POA: Diagnosis present

## 2023-04-22 DIAGNOSIS — E035 Myxedema coma: Principal | ICD-10-CM | POA: Diagnosis present

## 2023-04-22 DIAGNOSIS — E7800 Pure hypercholesterolemia, unspecified: Secondary | ICD-10-CM | POA: Diagnosis present

## 2023-04-22 DIAGNOSIS — Z6828 Body mass index (BMI) 28.0-28.9, adult: Secondary | ICD-10-CM

## 2023-04-22 DIAGNOSIS — R4182 Altered mental status, unspecified: Secondary | ICD-10-CM | POA: Diagnosis not present

## 2023-04-22 DIAGNOSIS — R0602 Shortness of breath: Secondary | ICD-10-CM | POA: Diagnosis not present

## 2023-04-22 DIAGNOSIS — E440 Moderate protein-calorie malnutrition: Secondary | ICD-10-CM | POA: Insufficient documentation

## 2023-04-22 DIAGNOSIS — E1022 Type 1 diabetes mellitus with diabetic chronic kidney disease: Secondary | ICD-10-CM | POA: Diagnosis present

## 2023-04-22 DIAGNOSIS — F0150 Vascular dementia without behavioral disturbance: Secondary | ICD-10-CM | POA: Diagnosis present

## 2023-04-22 DIAGNOSIS — E1039 Type 1 diabetes mellitus with other diabetic ophthalmic complication: Secondary | ICD-10-CM | POA: Diagnosis not present

## 2023-04-22 DIAGNOSIS — R1310 Dysphagia, unspecified: Secondary | ICD-10-CM | POA: Diagnosis not present

## 2023-04-22 DIAGNOSIS — R339 Retention of urine, unspecified: Secondary | ICD-10-CM | POA: Diagnosis not present

## 2023-04-22 DIAGNOSIS — E108 Type 1 diabetes mellitus with unspecified complications: Secondary | ICD-10-CM | POA: Diagnosis not present

## 2023-04-22 DIAGNOSIS — R531 Weakness: Secondary | ICD-10-CM | POA: Diagnosis not present

## 2023-04-22 DIAGNOSIS — R1312 Dysphagia, oropharyngeal phase: Secondary | ICD-10-CM | POA: Diagnosis not present

## 2023-04-22 DIAGNOSIS — E1065 Type 1 diabetes mellitus with hyperglycemia: Secondary | ICD-10-CM | POA: Diagnosis present

## 2023-04-22 DIAGNOSIS — R400 Somnolence: Secondary | ICD-10-CM | POA: Diagnosis not present

## 2023-04-22 DIAGNOSIS — Z794 Long term (current) use of insulin: Secondary | ICD-10-CM | POA: Diagnosis not present

## 2023-04-22 DIAGNOSIS — E860 Dehydration: Secondary | ICD-10-CM | POA: Diagnosis not present

## 2023-04-22 DIAGNOSIS — Z803 Family history of malignant neoplasm of breast: Secondary | ICD-10-CM

## 2023-04-22 DIAGNOSIS — I6782 Cerebral ischemia: Secondary | ICD-10-CM | POA: Diagnosis not present

## 2023-04-22 DIAGNOSIS — D649 Anemia, unspecified: Secondary | ICD-10-CM | POA: Diagnosis present

## 2023-04-22 DIAGNOSIS — N179 Acute kidney failure, unspecified: Secondary | ICD-10-CM | POA: Diagnosis not present

## 2023-04-22 DIAGNOSIS — K828 Other specified diseases of gallbladder: Secondary | ICD-10-CM | POA: Diagnosis not present

## 2023-04-22 DIAGNOSIS — Z91148 Patient's other noncompliance with medication regimen for other reason: Secondary | ICD-10-CM

## 2023-04-22 DIAGNOSIS — Z833 Family history of diabetes mellitus: Secondary | ICD-10-CM

## 2023-04-22 DIAGNOSIS — R569 Unspecified convulsions: Secondary | ICD-10-CM | POA: Diagnosis not present

## 2023-04-22 DIAGNOSIS — K5900 Constipation, unspecified: Secondary | ICD-10-CM | POA: Diagnosis present

## 2023-04-22 DIAGNOSIS — E873 Alkalosis: Secondary | ICD-10-CM | POA: Diagnosis present

## 2023-04-22 DIAGNOSIS — E038 Other specified hypothyroidism: Secondary | ICD-10-CM | POA: Diagnosis not present

## 2023-04-22 DIAGNOSIS — I10 Essential (primary) hypertension: Secondary | ICD-10-CM | POA: Diagnosis not present

## 2023-04-22 DIAGNOSIS — Z882 Allergy status to sulfonamides status: Secondary | ICD-10-CM

## 2023-04-22 DIAGNOSIS — G9349 Other encephalopathy: Secondary | ICD-10-CM | POA: Diagnosis present

## 2023-04-22 DIAGNOSIS — E039 Hypothyroidism, unspecified: Secondary | ICD-10-CM | POA: Diagnosis not present

## 2023-04-22 DIAGNOSIS — E103299 Type 1 diabetes mellitus with mild nonproliferative diabetic retinopathy without macular edema, unspecified eye: Secondary | ICD-10-CM | POA: Diagnosis present

## 2023-04-22 DIAGNOSIS — Z7982 Long term (current) use of aspirin: Secondary | ICD-10-CM

## 2023-04-22 DIAGNOSIS — Z9851 Tubal ligation status: Secondary | ICD-10-CM

## 2023-04-22 DIAGNOSIS — R001 Bradycardia, unspecified: Secondary | ICD-10-CM | POA: Diagnosis not present

## 2023-04-22 DIAGNOSIS — D509 Iron deficiency anemia, unspecified: Secondary | ICD-10-CM | POA: Diagnosis not present

## 2023-04-22 DIAGNOSIS — E512 Wernicke's encephalopathy: Secondary | ICD-10-CM | POA: Diagnosis not present

## 2023-04-22 DIAGNOSIS — Z431 Encounter for attention to gastrostomy: Secondary | ICD-10-CM | POA: Diagnosis not present

## 2023-04-22 DIAGNOSIS — Z4682 Encounter for fitting and adjustment of non-vascular catheter: Secondary | ICD-10-CM | POA: Diagnosis not present

## 2023-04-22 DIAGNOSIS — I129 Hypertensive chronic kidney disease with stage 1 through stage 4 chronic kidney disease, or unspecified chronic kidney disease: Secondary | ICD-10-CM | POA: Diagnosis present

## 2023-04-22 DIAGNOSIS — E063 Autoimmune thyroiditis: Secondary | ICD-10-CM | POA: Diagnosis present

## 2023-04-22 DIAGNOSIS — R627 Adult failure to thrive: Secondary | ICD-10-CM | POA: Diagnosis not present

## 2023-04-22 DIAGNOSIS — Z888 Allergy status to other drugs, medicaments and biological substances status: Secondary | ICD-10-CM

## 2023-04-22 DIAGNOSIS — Z931 Gastrostomy status: Secondary | ICD-10-CM | POA: Diagnosis not present

## 2023-04-22 DIAGNOSIS — T381X6A Underdosing of thyroid hormones and substitutes, initial encounter: Secondary | ICD-10-CM | POA: Diagnosis present

## 2023-04-22 DIAGNOSIS — M7989 Other specified soft tissue disorders: Secondary | ICD-10-CM | POA: Diagnosis not present

## 2023-04-22 DIAGNOSIS — Z91199 Patient's noncompliance with other medical treatment and regimen due to unspecified reason: Secondary | ICD-10-CM

## 2023-04-22 DIAGNOSIS — G9341 Metabolic encephalopathy: Secondary | ICD-10-CM | POA: Diagnosis present

## 2023-04-22 DIAGNOSIS — E876 Hypokalemia: Secondary | ICD-10-CM | POA: Diagnosis not present

## 2023-04-22 DIAGNOSIS — Z880 Allergy status to penicillin: Secondary | ICD-10-CM

## 2023-04-22 DIAGNOSIS — E870 Hyperosmolality and hypernatremia: Secondary | ICD-10-CM | POA: Diagnosis not present

## 2023-04-22 DIAGNOSIS — Z885 Allergy status to narcotic agent status: Secondary | ICD-10-CM

## 2023-04-22 DIAGNOSIS — Z91010 Allergy to peanuts: Secondary | ICD-10-CM

## 2023-04-22 DIAGNOSIS — Z9641 Presence of insulin pump (external) (internal): Secondary | ICD-10-CM | POA: Diagnosis present

## 2023-04-22 DIAGNOSIS — R9089 Other abnormal findings on diagnostic imaging of central nervous system: Secondary | ICD-10-CM | POA: Diagnosis not present

## 2023-04-22 DIAGNOSIS — Z79890 Hormone replacement therapy (postmenopausal): Secondary | ICD-10-CM

## 2023-04-22 DIAGNOSIS — Z79899 Other long term (current) drug therapy: Secondary | ICD-10-CM

## 2023-04-22 DIAGNOSIS — S40021A Contusion of right upper arm, initial encounter: Secondary | ICD-10-CM | POA: Diagnosis not present

## 2023-04-22 DIAGNOSIS — Z7401 Bed confinement status: Secondary | ICD-10-CM | POA: Diagnosis not present

## 2023-04-22 LAB — URINALYSIS, ROUTINE W REFLEX MICROSCOPIC
Bilirubin Urine: NEGATIVE
Glucose, UA: 50 mg/dL — AB
Hgb urine dipstick: NEGATIVE
Ketones, ur: 5 mg/dL — AB
Leukocytes,Ua: NEGATIVE
Nitrite: NEGATIVE
Protein, ur: NEGATIVE mg/dL
Specific Gravity, Urine: 1.015 (ref 1.005–1.030)
pH: 6 (ref 5.0–8.0)

## 2023-04-22 LAB — COMPREHENSIVE METABOLIC PANEL
ALT: 24 U/L (ref 0–44)
AST: 35 U/L (ref 15–41)
Albumin: 3.7 g/dL (ref 3.5–5.0)
Alkaline Phosphatase: 60 U/L (ref 38–126)
Anion gap: 20 — ABNORMAL HIGH (ref 5–15)
BUN: 32 mg/dL — ABNORMAL HIGH (ref 8–23)
CO2: 24 mmol/L (ref 22–32)
Calcium: 12 mg/dL — ABNORMAL HIGH (ref 8.9–10.3)
Chloride: 93 mmol/L — ABNORMAL LOW (ref 98–111)
Creatinine, Ser: 1.32 mg/dL — ABNORMAL HIGH (ref 0.44–1.00)
GFR, Estimated: 45 mL/min — ABNORMAL LOW (ref >60)
Glucose, Bld: 128 mg/dL — ABNORMAL HIGH (ref 70–99)
Potassium: 3.7 mmol/L (ref 3.5–5.1)
Sodium: 137 mmol/L (ref 135–145)
Total Bilirubin: 1.4 mg/dL — ABNORMAL HIGH (ref 0.3–1.2)
Total Protein: 6.9 g/dL (ref 6.5–8.1)

## 2023-04-22 LAB — CBC WITH DIFFERENTIAL/PLATELET
Abs Immature Granulocytes: 0.03 10*3/uL (ref 0.00–0.07)
Basophils Absolute: 0 10*3/uL (ref 0.0–0.1)
Basophils Relative: 0 %
Eosinophils Absolute: 0.1 10*3/uL (ref 0.0–0.5)
Eosinophils Relative: 1 %
HCT: 35.7 % — ABNORMAL LOW (ref 36.0–46.0)
Hemoglobin: 11.8 g/dL — ABNORMAL LOW (ref 12.0–15.0)
Immature Granulocytes: 0 %
Lymphocytes Relative: 17 %
Lymphs Abs: 1.1 10*3/uL (ref 0.7–4.0)
MCH: 28.3 pg (ref 26.0–34.0)
MCHC: 33.1 g/dL (ref 30.0–36.0)
MCV: 85.6 fL (ref 80.0–100.0)
Monocytes Absolute: 0.7 10*3/uL (ref 0.1–1.0)
Monocytes Relative: 10 %
Neutro Abs: 4.8 10*3/uL (ref 1.7–7.7)
Neutrophils Relative %: 72 %
Platelets: 166 10*3/uL (ref 150–400)
RBC: 4.17 MIL/uL (ref 3.87–5.11)
RDW: 14.8 % (ref 11.5–15.5)
WBC: 6.7 10*3/uL (ref 4.0–10.5)
nRBC: 0 % (ref 0.0–0.2)

## 2023-04-22 LAB — CHLORIDE, URINE, RANDOM: Chloride Urine: 19 mmol/L

## 2023-04-22 LAB — I-STAT VENOUS BLOOD GAS, ED
Acid-Base Excess: 9 mmol/L — ABNORMAL HIGH (ref 0.0–2.0)
Bicarbonate: 30.7 mmol/L — ABNORMAL HIGH (ref 20.0–28.0)
Calcium, Ion: 1.28 mmol/L (ref 1.15–1.40)
HCT: 37 % (ref 36.0–46.0)
Hemoglobin: 12.6 g/dL (ref 12.0–15.0)
O2 Saturation: 100 %
Potassium: 3.5 mmol/L (ref 3.5–5.1)
Sodium: 134 mmol/L — ABNORMAL LOW (ref 135–145)
TCO2: 32 mmol/L (ref 22–32)
pCO2, Ven: 31.5 mm[Hg] — ABNORMAL LOW (ref 44–60)
pH, Ven: 7.597 — ABNORMAL HIGH (ref 7.25–7.43)
pO2, Ven: 180 mm[Hg] — ABNORMAL HIGH (ref 32–45)

## 2023-04-22 LAB — CBG MONITORING, ED
Glucose-Capillary: 127 mg/dL — ABNORMAL HIGH (ref 70–99)
Glucose-Capillary: 157 mg/dL — ABNORMAL HIGH (ref 70–99)

## 2023-04-22 LAB — OSMOLALITY: Osmolality: 299 mosm/kg — ABNORMAL HIGH (ref 275–295)

## 2023-04-22 LAB — TSH: TSH: 44.087 u[IU]/mL — ABNORMAL HIGH (ref 0.350–4.500)

## 2023-04-22 LAB — T4, FREE: Free T4: <0.25 ng/dL — ABNORMAL LOW (ref 0.61–1.12)

## 2023-04-22 LAB — LACTIC ACID, PLASMA
Lactic Acid, Venous: 1.7 mmol/L (ref 0.5–1.9)
Lactic Acid, Venous: 2.1 mmol/L (ref 0.5–1.9)

## 2023-04-22 LAB — MAGNESIUM: Magnesium: 2.1 mg/dL (ref 1.7–2.4)

## 2023-04-22 MED ORDER — ENOXAPARIN SODIUM 40 MG/0.4ML IJ SOSY
40.0000 mg | PREFILLED_SYRINGE | Freq: Every day | INTRAMUSCULAR | Status: DC
  Administered 2023-04-23 – 2023-05-03 (×10): 40 mg via SUBCUTANEOUS
  Filled 2023-04-22 (×11): qty 0.4

## 2023-04-22 MED ORDER — ACETAMINOPHEN 325 MG PO TABS: 650.0000 mg | ORAL_TABLET | Freq: Four times a day (QID) | ORAL | Status: DC | PRN

## 2023-04-22 MED ORDER — ACETAMINOPHEN 650 MG RE SUPP: 650.0000 mg | Freq: Four times a day (QID) | RECTAL | Status: DC | PRN

## 2023-04-22 MED ORDER — DOCUSATE SODIUM 100 MG PO CAPS
100.0000 mg | ORAL_CAPSULE | Freq: Two times a day (BID) | ORAL | Status: DC
  Administered 2023-04-24 – 2023-05-08 (×19): 100 mg via ORAL
  Filled 2023-04-22 (×29): qty 1

## 2023-04-22 MED ORDER — ONDANSETRON HCL 4 MG/2ML IJ SOLN: 4.0000 mg | Freq: Four times a day (QID) | INTRAMUSCULAR | Status: DC | PRN

## 2023-04-22 MED ORDER — LACTATED RINGERS IV BOLUS
1000.0000 mL | Freq: Once | INTRAVENOUS | Status: AC
  Administered 2023-04-22: 1000 mL via INTRAVENOUS

## 2023-04-22 MED ORDER — LEVOTHYROXINE SODIUM 100 MCG/5ML IV SOLN
100.0000 ug | Freq: Once | INTRAVENOUS | Status: AC
  Administered 2023-04-22: 100 ug via INTRAVENOUS
  Filled 2023-04-22: qty 5

## 2023-04-22 MED ORDER — ONDANSETRON HCL 4 MG PO TABS: 4.0000 mg | ORAL_TABLET | Freq: Four times a day (QID) | ORAL | Status: DC | PRN

## 2023-04-22 MED ORDER — SORBITOL 70 % SOLN: 30.0000 mL | Freq: Every day | Status: DC | PRN

## 2023-04-22 NOTE — ED Notes (Signed)
Called lab to add on chlorine UA and magnesium blood.

## 2023-04-22 NOTE — ED Provider Notes (Signed)
[place] EMERGENCY DEPARTMENT AT [facility] Provider Note   CSN: [id number] Arrival date & time: 04/22/23  1616     History {Add pertinent medical, surgical, social history, OB history to HPI:1} Chief Complaint  Patient presents with   Weakness    Katherine Navarro is a 67 y.o. female with PMH T1DM, HTN, hypothyroidism who presents for altered mental status. For 2-3 weeks patient has been increasingly sleepy and less mobile. Today EMS was called to bring patient to hospital. Glucose was in 150s per EMS.   Attempted to call her spouse at numbers send in chart for further information but was unable to obtain details as went to voicemail with both.    The history is provided by the patient, medical records and the EMS personnel. The history is limited by the condition of the patient. No language interpreter was used.       Home Medications Prior to Admission medications   Medication Sig Start Date End Date Taking? Authorizing Provider  Accu-Chek Softclix Lancets lancets Use as instructed 06/23/21   Romero Belling, MD  amiloride-hydrochlorothiazide (MODURETIC) 5-50 MG tablet Take 0.5 tablets by mouth daily. 10/31/19 02/01/20  Jarold Motto, PA  brimonidine (ALPHAGAN P) 0.1 % SOLN Place 1 drop into both eyes daily as needed (red/irritated eyes).    [provider]  cyclobenzaprine (FLEXERIL) 10 MG tablet Take 1 tablet (10 mg total) by mouth 3 (three) times daily as needed for muscle spasms. 02/08/20   Meyran, Tiana Loft, NP  dorzolamide-timolol (COSOPT) 22.3-6.8 MG/ML ophthalmic solution Place 1 drop into both eyes daily.     [provider]  glucose blood (ACCU-CHEK GUIDE) test strip 1 each by Other route See admin instructions. Check 5 times per day, and lancets 5/day 03/19/21   Romero Belling, MD  Insulin Infusion Pump Supplies (MINIMED INFUSION SET-MMT 396) MISC 1 Device by Other route every other day. 03/19/21   Romero Belling, MD  Insulin Infusion Pump  Supplies (PARADIGM RESERVOIR ) MISC 1 Device by Other route every other day. 12/18/20   Romero Belling, MD  insulin lispro (HUMALOG) 100 UNIT/ML injection Via pump, total of 50 units per day 12/18/20   Romero Belling, MD  levothyroxine (SYNTHROID) 112 MCG tablet Take 1 tablet (112 mcg total) by mouth daily before breakfast. 03/19/21   Romero Belling, MD      Allergies    Atorvastatin, Ciprofloxacin, Epinephrine, Erythromycin, Morphine, and Penicillins    Review of Systems   Review of Systems unable to obtain 2/2 patient condition  Physical Exam Updated Vital Signs BP 132/66 (BP Location: Right Arm)   Pulse 62   Temp 98.6 F (37 C) (Oral)   Resp 15   LMP 05/03/2013   SpO2 98%  Physical Exam Constitutional:      General: She is not in acute distress.    Appearance: She is not ill-appearing.     Comments: Somnolent but responds to voice  HENT:     Head: Normocephalic and atraumatic.     Nose: Nose normal.     Mouth/Throat:     Mouth: Mucous membranes are moist.  Eyes:     Comments: Chronic changes to the L eye. R eye with mid dilated pupil that is not reactive. Suspect chronic changes or 2/2 prior procedures. Bilateral symmetric periorbital edema evident.  Cardiovascular:     Rate and Rhythm: Normal rate and regular rhythm.     Pulses: Normal pulses.     Heart sounds: Normal heart  sounds.  Pulmonary:     Effort: Pulmonary effort is normal.     Breath sounds: Normal breath sounds.  Abdominal:     General: There is no distension.     Palpations: Abdomen is soft. There is no mass.     Tenderness: There is no abdominal tenderness. There is no guarding or rebound.     Hernia: No hernia is present.  Musculoskeletal:     Cervical back: Neck supple.     Right lower leg: No edema.     Left lower leg: No edema.  Skin:    General: Skin is warm.     Capillary Refill: Capillary refill takes less than 2 seconds.  Neurological:     Sensory: No sensory deficit.     Motor: No weakness.      Comments: Moves all extremities; follows simple commands; answers questions appropriately     ED Results / Procedures / Treatments   Labs (all labs ordered are listed, but only abnormal results are displayed) Labs Reviewed  CBG MONITORING, ED - Abnormal; Notable for the following components:      Result Value   Glucose-Capillary 127 (*)    All other components within normal limits  URINE CULTURE  URINALYSIS, ROUTINE W REFLEX MICROSCOPIC  CBC WITH DIFFERENTIAL/PLATELET  COMPREHENSIVE METABOLIC PANEL  TSH  LACTIC ACID, PLASMA  LACTIC ACID, PLASMA  I-STAT VENOUS BLOOD GAS, ED    EKG EKG Interpretation Date/Time:  Friday April 22 2023 16:53:48 EDT Ventricular Rate:  69 PR Interval:    QRS Duration:  116 QT Interval:  405 QTC Calculation: 434 R Axis:   121  Text Interpretation: Sinus rhythm possible lead reversal low voltage p waves Nonspecific intraventricular conduction delay T wave abnormality Confirmed by Gerhard Munch [phone]) on 04/22/2023 5:00:20 PM  Radiology No results found.  Procedures Procedures  {Document cardiac monitor, telemetry assessment procedure when appropriate:1}  Medications Ordered in ED Medications  lactated ringers bolus 1,000 mL (has no administration in time range)    ED Course/ Medical Decision Making/ A&P   {   Click here for ABCD2, HEART and other calculatorsREFRESH Note before signing :1}                              Medical Decision Making Amount and/or Complexity of Data Reviewed Independent Historian: EMS External Data Reviewed: notes. Labs: ordered. Decision-making details documented in ED Course. Radiology: ordered and independent interpretation performed. Decision-making details documented in ED Course. ECG/medicine tests: ordered and independent interpretation performed. Decision-making details documented in ED Course.   67 y/o F with PMH insulin dependent diabetes and thyroid disease here for several weeks of  somnolence and altered mental status of unclear etiology. DDX considered includes electrolyte or metabolic derangement (hypoglycemia, DKA, hyponatremia, uremia), intracranial hemorrhage mass or stroke, thyroid derangement (myxedema coma), sepsis or other infectious process (UTI, pneumonia, CNS infection) amongst others. On arrival she is stable and afebrile and not septic appearing. Appears somnolent, responds to voice, but slow to respond. No focal neurologic deficit. Responds appropriately to painful stimuli. Airway is protected. No clear source for infection on exam. Appears dehydrated/dry.   Broad infectious and metabolic workup for AMS obtained including CT head w/o contrast, labs, EKG, UA, urine culture, CXR, lactate, TSH obtained. LR bolus given due to dry appearance.    POC glucose 127. EKG obtained and shows normal sinus rhythm rate of 68, normal axis and intervals, no ST  segment elevations or depressions, no pathologic T wave inversions, no ischemic changes or changes suggest electrolyte derangement.  In and out cath performed after initial evaluation to obtain samples for UA and Ucx; patient had 1200 cc out. UA shows 5 ketones, no UTI.    Labs show a TSH of 44 for which a T4 was sent as follow-up.  T4 ***.  Labs also show what appears to be a metabolic alkalosis on her blood gas with a pH is 7.59 and a bicarb of 30 and pCO2 of 31 so not hypercarbic; though bicarb on her CMP is 24. Urine osms were sent to f/u alkalosis and are <20 (chloride responsive); etiology could be vomiting/diarrhea, thiazides or loop diuretics. Was unable to review home meds with her due to condition. ***   Calcium, sodium, potassium, and magnesium are all normal.  Renal function is slightly worse than her previous labs with creatinine now 1.32 and BUN of 32. She has an anion gap of 20 but is not acidotic and bicarb is normal on CMP, she is relatively euglycemic and does not have risk factors for euglycemic DKA. Lactate is  normal. Mild uremia of 32 but not enough to cause AG of 20. Osmoles were sent and are 299 but no osmolar gap doubt toxic alcohol etiology for anion gap.    CT head shows no acute intracranial pathology CXR shows no acute cardiopulmonary abnormality  Suspect her symptoms could be due to ***.    {Document critical care time when appropriate:1} {Document review of labs and clinical decision tools ie heart score, Chads2Vasc2 etc:1}  {Document your independent review of radiology images, and any outside records:1} {Document your discussion with family members, caretakers, and with consultants:1} {Document social determinants of health affecting pt's care:1} {Document your decision making why or why not admission, treatments were needed:1} Final Clinical Impression(s) / ED Diagnoses Final diagnoses:  Altered mental status, unspecified altered mental status type    Rx / DC Orders ED Discharge Orders     None

## 2023-04-22 NOTE — ED Triage Notes (Addendum)
From home. Came here for generalized weakness, lethargy and slow to respond. Pt is very drowsy at this time. Family states it has been going on for a week. Alert and oriented x 3. Baseline is independent at home. Currently able to answer some questions but falls asleep before finishing sentences.   EMS VS: CBG 147

## 2023-04-23 ENCOUNTER — Other Ambulatory Visit: Payer: Self-pay

## 2023-04-23 ENCOUNTER — Inpatient Hospital Stay (HOSPITAL_COMMUNITY): Payer: Medicare HMO

## 2023-04-23 DIAGNOSIS — R4182 Altered mental status, unspecified: Secondary | ICD-10-CM | POA: Diagnosis not present

## 2023-04-23 DIAGNOSIS — E1039 Type 1 diabetes mellitus with other diabetic ophthalmic complication: Secondary | ICD-10-CM

## 2023-04-23 DIAGNOSIS — E039 Hypothyroidism, unspecified: Secondary | ICD-10-CM | POA: Diagnosis not present

## 2023-04-23 DIAGNOSIS — N179 Acute kidney failure, unspecified: Secondary | ICD-10-CM

## 2023-04-23 DIAGNOSIS — R569 Unspecified convulsions: Secondary | ICD-10-CM

## 2023-04-23 DIAGNOSIS — G9341 Metabolic encephalopathy: Secondary | ICD-10-CM

## 2023-04-23 LAB — GLUCOSE, CAPILLARY
Glucose-Capillary: 106 mg/dL — ABNORMAL HIGH (ref 70–99)
Glucose-Capillary: 180 mg/dL — ABNORMAL HIGH (ref 70–99)
Glucose-Capillary: 208 mg/dL — ABNORMAL HIGH (ref 70–99)
Glucose-Capillary: 76 mg/dL (ref 70–99)
Glucose-Capillary: 96 mg/dL (ref 70–99)

## 2023-04-23 LAB — CBC WITH DIFFERENTIAL/PLATELET
Abs Immature Granulocytes: 0.01 10*3/uL (ref 0.00–0.07)
Basophils Absolute: 0 10*3/uL (ref 0.0–0.1)
Basophils Relative: 0 %
Eosinophils Absolute: 0 10*3/uL (ref 0.0–0.5)
Eosinophils Relative: 0 %
HCT: 36.9 % (ref 36.0–46.0)
Hemoglobin: 12.8 g/dL (ref 12.0–15.0)
Immature Granulocytes: 0 %
Lymphocytes Relative: 11 %
Lymphs Abs: 0.7 10*3/uL (ref 0.7–4.0)
MCH: 29.4 pg (ref 26.0–34.0)
MCHC: 34.7 g/dL (ref 30.0–36.0)
MCV: 84.6 fL (ref 80.0–100.0)
Monocytes Absolute: 0.4 10*3/uL (ref 0.1–1.0)
Monocytes Relative: 6 %
Neutro Abs: 5.4 10*3/uL (ref 1.7–7.7)
Neutrophils Relative %: 83 %
Platelets: 181 10*3/uL (ref 150–400)
RBC: 4.36 MIL/uL (ref 3.87–5.11)
RDW: 14.6 % (ref 11.5–15.5)
WBC: 6.5 10*3/uL (ref 4.0–10.5)
nRBC: 0.6 % — ABNORMAL HIGH (ref 0.0–0.2)

## 2023-04-23 LAB — RAPID URINE DRUG SCREEN, HOSP PERFORMED
Amphetamines: NOT DETECTED
Barbiturates: NOT DETECTED
Benzodiazepines: NOT DETECTED
Cocaine: NOT DETECTED
Opiates: NOT DETECTED
Tetrahydrocannabinol: NOT DETECTED

## 2023-04-23 LAB — COMPREHENSIVE METABOLIC PANEL
ALT: 23 U/L (ref 0–44)
AST: 31 U/L (ref 15–41)
Albumin: 3.6 g/dL (ref 3.5–5.0)
Alkaline Phosphatase: 62 U/L (ref 38–126)
Anion gap: 14 (ref 5–15)
BUN: 30 mg/dL — ABNORMAL HIGH (ref 8–23)
CO2: 27 mmol/L (ref 22–32)
Calcium: 11.5 mg/dL — ABNORMAL HIGH (ref 8.9–10.3)
Chloride: 95 mmol/L — ABNORMAL LOW (ref 98–111)
Creatinine, Ser: 1.24 mg/dL — ABNORMAL HIGH (ref 0.44–1.00)
GFR, Estimated: 48 mL/min — ABNORMAL LOW (ref >60)
Glucose, Bld: 81 mg/dL (ref 70–99)
Potassium: 3.4 mmol/L — ABNORMAL LOW (ref 3.5–5.1)
Sodium: 136 mmol/L (ref 135–145)
Total Bilirubin: 1.1 mg/dL (ref 0.3–1.2)
Total Protein: 7 g/dL (ref 6.5–8.1)

## 2023-04-23 LAB — MAGNESIUM
Magnesium: 0.8 mg/dL — CL (ref 1.7–2.4)
Magnesium: 3.7 mg/dL — ABNORMAL HIGH (ref 1.7–2.4)

## 2023-04-23 LAB — VITAMIN B12: Vitamin B-12: 1992 pg/mL — ABNORMAL HIGH (ref 180–914)

## 2023-04-23 LAB — CBG MONITORING, ED
Glucose-Capillary: 278 mg/dL — ABNORMAL HIGH (ref 70–99)
Glucose-Capillary: 260 mg/dL — ABNORMAL HIGH (ref 70–99)
Glucose-Capillary: 230 mg/dL — ABNORMAL HIGH (ref 70–99)
Glucose-Capillary: 207 mg/dL — ABNORMAL HIGH (ref 70–99)
Glucose-Capillary: 226 mg/dL — ABNORMAL HIGH (ref 70–99)

## 2023-04-23 LAB — PHOSPHORUS: Phosphorus: <1 mg/dL — CL (ref 2.5–4.6)

## 2023-04-23 LAB — HIV ANTIBODY (ROUTINE TESTING W REFLEX): HIV Screen 4th Generation wRfx: NONREACTIVE

## 2023-04-23 LAB — AMMONIA: Ammonia: 23 umol/L (ref 9–35)

## 2023-04-23 MED ORDER — THIAMINE HCL 100 MG/ML IJ SOLN
500.0000 mg | Freq: Three times a day (TID) | INTRAVENOUS | Status: AC
  Administered 2023-04-23 – 2023-04-24 (×3): 500 mg via INTRAVENOUS
  Filled 2023-04-23 (×3): qty 5

## 2023-04-23 MED ORDER — THIAMINE HCL 100 MG/ML IJ SOLN
250.0000 mg | Freq: Three times a day (TID) | INTRAVENOUS | Status: AC
  Administered 2023-04-24 – 2023-04-25 (×3): 250 mg via INTRAVENOUS
  Filled 2023-04-23 (×3): qty 2.5

## 2023-04-23 MED ORDER — DEXTROSE-SODIUM CHLORIDE 5-0.9 % IV SOLN: INTRAVENOUS | Status: DC

## 2023-04-23 MED ORDER — INSULIN ASPART 100 UNIT/ML IJ SOLN
0.0000 [IU] | Freq: Three times a day (TID) | INTRAMUSCULAR | Status: DC
Start: 2023-04-23 — End: 2023-04-23
  Administered 2023-04-23: 3 [IU] via SUBCUTANEOUS

## 2023-04-23 MED ORDER — INSULIN ASPART 100 UNIT/ML IJ SOLN
0.0000 [IU] | INTRAMUSCULAR | Status: DC
Start: 2023-04-23 — End: 2023-04-24
  Administered 2023-04-23: 2 [IU] via SUBCUTANEOUS
  Administered 2023-04-24 (×2): 4 [IU] via SUBCUTANEOUS

## 2023-04-23 MED ORDER — INSULIN GLARGINE-YFGN 100 UNIT/ML ~~LOC~~ SOLN
20.0000 [IU] | Freq: Every day | SUBCUTANEOUS | Status: DC
  Administered 2023-04-23: 20 [IU] via SUBCUTANEOUS
  Filled 2023-04-23: qty 0.2

## 2023-04-23 MED ORDER — DEXTROSE 50 % IV SOLN
0.0000 mL | INTRAVENOUS | Status: DC | PRN
  Administered 2023-04-26: 50 mL via INTRAVENOUS
  Filled 2023-04-23: qty 50

## 2023-04-23 MED ORDER — DEXTROSE IN LACTATED RINGERS 5 % IV SOLN: INTRAVENOUS | Status: DC

## 2023-04-23 MED ORDER — INSULIN ASPART 100 UNIT/ML IJ SOLN
0.0000 [IU] | Freq: Every day | INTRAMUSCULAR | Status: DC
Start: 2023-04-23 — End: 2023-04-23

## 2023-04-23 MED ORDER — INSULIN REGULAR(HUMAN) IN NACL 100-0.9 UT/100ML-% IV SOLN
INTRAVENOUS | Status: DC
  Administered 2023-04-23: 6.5 [IU]/h via INTRAVENOUS
  Filled 2023-04-23: qty 100

## 2023-04-23 MED ORDER — LACTATED RINGERS IV SOLN: INTRAVENOUS | Status: DC

## 2023-04-23 MED ORDER — INSULIN ASPART 100 UNIT/ML IJ SOLN
3.0000 [IU] | Freq: Three times a day (TID) | INTRAMUSCULAR | Status: DC
  Administered 2023-04-23: 3 [IU] via SUBCUTANEOUS

## 2023-04-23 MED ORDER — MAGNESIUM SULFATE 4 GM/100ML IV SOLN
4.0000 g | Freq: Once | INTRAVENOUS | Status: AC
  Administered 2023-04-23: 4 g via INTRAVENOUS
  Filled 2023-04-23: qty 100

## 2023-04-23 MED ORDER — INSULIN ASPART 100 UNIT/ML IJ SOLN: 0.0000 [IU] | Freq: Three times a day (TID) | INTRAMUSCULAR | Status: DC

## 2023-04-23 MED ORDER — LEVOTHYROXINE SODIUM 100 MCG/5ML IV SOLN
100.0000 ug | Freq: Every day | INTRAVENOUS | Status: DC
  Administered 2023-04-23 – 2023-04-24 (×2): 100 ug via INTRAVENOUS
  Filled 2023-04-23 (×2): qty 5

## 2023-04-23 MED ORDER — POTASSIUM PHOSPHATES 15 MMOLE/5ML IV SOLN
30.0000 mmol | Freq: Once | INTRAVENOUS | Status: AC
  Administered 2023-04-23: 30 mmol via INTRAVENOUS
  Filled 2023-04-23: qty 10

## 2023-04-23 MED ORDER — HYDROCORTISONE SOD SUC (PF) 100 MG IJ SOLR
100.0000 mg | Freq: Three times a day (TID) | INTRAMUSCULAR | Status: DC
  Administered 2023-04-23 (×2): 100 mg via INTRAVENOUS
  Filled 2023-04-23 (×2): qty 2

## 2023-04-23 MED ORDER — HYDROCORTISONE SOD SUC (PF) 100 MG IJ SOLR
75.0000 mg | Freq: Two times a day (BID) | INTRAMUSCULAR | Status: DC
  Administered 2023-04-23: 75 mg via INTRAVENOUS
  Filled 2023-04-23: qty 2

## 2023-04-23 MED ORDER — THIAMINE HCL 100 MG/ML IJ SOLN
100.0000 mg | Freq: Every day | INTRAMUSCULAR | Status: DC
  Administered 2023-04-26 – 2023-04-28 (×3): 100 mg via INTRAVENOUS
  Filled 2023-04-23 (×3): qty 2

## 2023-04-23 NOTE — Progress Notes (Signed)
SLP Cancellation Note  Patient Details Name: Katherine Navarro MRN: [id number] DOB: 1955-07-18   Cancelled treatment:       Reason Eval/Treat Not Completed: Fatigue/lethargy limiting ability to participate;Patient's level of consciousness/ SLP will f/u next date   Angela Nevin, MA, CCC-SLP Speech Therapy

## 2023-04-23 NOTE — ED Notes (Signed)
ED TO INPATIENT HANDOFF REPORT  ED Nurse Name and Phone #: Elihu Milstein [phone]  S Name/Age/Gender Katherine Navarro 67 y.o. female Room/Bed: 008C/008C  Code Status   Code Status: Full Code  Home/SNF/Other Home Patient oriented to: self, place, time, and situation Is this baseline? Yes   Triage Complete: Triage complete  Chief Complaint Severe hypothyroidism [E03.9]  Triage Note From home. Came here for generalized weakness, lethargy and slow to respond. Pt is very drowsy at this time. Family states it has been going on for a week. Alert and oriented x 3. Baseline is independent at home. Currently able to answer some questions but falls asleep before finishing sentences.   EMS VS: CBG 147    Allergies Allergies  Allergen Reactions   Atorvastatin     REACTION: perceived myalgias   Ciprofloxacin Nausea And Vomiting   Epinephrine     REACTION: thyroid problems   Erythromycin     Can not recall   Morphine Nausea Only    Headache   Penicillins Hives    Level of Care/Admitting Diagnosis ED Disposition     ED Disposition  Admit   Condition  --   Comment  Hospital Area: [facility] [[id number]]  Level of Care: Progressive [102]  Admit to Progressive based on following criteria: GI, ENDOCRINE disease patients with GI bleeding, acute liver failure or pancreatitis, stable with diabetic ketoacidosis or thyrotoxicosis (hypothyroid) state.  May admit patient to [facility] or [facility] if equivalent level of care is available:: No  Covid Evaluation: Asymptomatic - no recent exposure (last 10 days) testing not required  Diagnosis: Severe hypothyroidism [098119]  Admitting Physician: Buena Irish [[id number]]  Attending Physician: Buena Irish [phone]  Certification:: I certify this patient will need inpatient services for at least 2 midnights  Expected Medical Readiness: 04/29/2023          B Medical/Surgery History Past Medical History:   Diagnosis Date   Complication of anesthesia    trouble waking up   CONSTIPATION, CHRONIC 12/13/2007   DIABETES MELLITUS, TYPE I 01/07/2007   DM nephropathy/sclerosis    GLAUCOMA 12/13/2007   HYPERCHOLESTEROLEMIA 12/13/2007   Hypertension    Hyperthyroidism    INTERNAL HEMORRHOIDS 07/23/2008   LEUKOPENIA, CHRONIC 12/13/2007   Nonproliferative diabetic retinopathy NOS(362.03) 12/13/2007   Unspecified hypothyroidism 12/13/2007   VARICOSE VEINS, LOWER EXTREMITIES 12/13/2007   Past Surgical History:  Procedure Laterality Date   ANTERIOR CERVICAL DECOMP/DISCECTOMY FUSION N/A 02/06/2020   Procedure: Anterior Cervical Decompression Discectomy Fusion Cervical five-six;  Surgeon: Donalee Citrin, MD;  Location: [facility];  Service: Neurosurgery;  Laterality: N/A;   ELECTROCARDIOGRAM  08/24/2006   EYE SURGERY     bilateral   LEEP N/A 12/01/2012   Procedure: LOOP ELECTROSURGICAL EXCISION PROCEDURE (LEEP);  Surgeon: Purcell Nails, MD;  Location: [facility]S;  Service: Gynecology;  Laterality: N/A;   Stress Myoview   05/18/2004   TUBAL LIGATION       A IV Location/Drains/Wounds Patient Lines/Drains/Airways Status     Active Line/Drains/Airways     Name Placement date Placement time Site Days   Peripheral IV 04/22/23 20 G Anterior;Left Hand 04/22/23  1618  Hand  1   Peripheral IV 04/22/23 20 G Posterior;Right Hand 04/22/23  --  Hand  1   Incision 12/01/12 Vagina 12/01/12  1200  -- 3795   Incision (Closed) 02/06/20 Neck 02/06/20  1409  -- 1172            Intake/Output Last 24  hours  Intake/Output Summary (Last 24 hours) at 04/23/2023 2595 Last data filed at 04/23/2023 0347 Gross per 24 hour  Intake 152.08 ml  Output 1200 ml  Net -1047.92 ml    Labs/Imaging Results for orders placed or performed during the hospital encounter of 04/22/23 (from the past 48 hour(s))  Urinalysis, Routine w reflex microscopic -Urine, Clean Catch     Status: Abnormal   Collection Time: 04/22/23  4:26 PM  Result Value  Ref Range   Color, Urine YELLOW YELLOW   APPearance CLEAR CLEAR   Specific Gravity, Urine 1.015 1.005 - 1.030   pH 6.0 5.0 - 8.0   Glucose, UA 50 (A) NEGATIVE mg/dL   Hgb urine dipstick NEGATIVE NEGATIVE   Bilirubin Urine NEGATIVE NEGATIVE   Ketones, ur 5 (A) NEGATIVE mg/dL   Protein, ur NEGATIVE NEGATIVE mg/dL   Nitrite NEGATIVE NEGATIVE   Leukocytes,Ua NEGATIVE NEGATIVE    Comment: Performed at [facility], [address] [place], [place] [address]  CBC with Differential     Status: Abnormal   Collection Time: 04/22/23  4:26 PM  Result Value Ref Range   WBC 6.7 4.0 - 10.5 K/uL   RBC 4.17 3.87 - 5.11 MIL/uL   Hemoglobin 11.8 (L) 12.0 - 15.0 g/dL   HCT 64.3 (L) 32.9 - 51.8 %   MCV 85.6 80.0 - 100.0 fL   MCH 28.3 26.0 - 34.0 pg   MCHC 33.1 30.0 - 36.0 g/dL   RDW 84.1 66.0 - 63.0 %   Platelets 166 150 - 400 K/uL   nRBC 0.0 0.0 - 0.2 %   Neutrophils Relative % 72 %   Neutro Abs 4.8 1.7 - 7.7 K/uL   Lymphocytes Relative 17 %   Lymphs Abs 1.1 0.7 - 4.0 K/uL   Monocytes Relative 10 %   Monocytes Absolute 0.7 0.1 - 1.0 K/uL   Eosinophils Relative 1 %   Eosinophils Absolute 0.1 0.0 - 0.5 K/uL   Basophils Relative 0 %   Basophils Absolute 0.0 0.0 - 0.1 K/uL   Immature Granulocytes 0 %   Abs Immature Granulocytes 0.03 0.00 - 0.07 K/uL    Comment: Performed at [facility], [address] [place], [place] [address]  Comprehensive metabolic panel     Status: Abnormal   Collection Time: 04/22/23  4:26 PM  Result Value Ref Range   Sodium 137 135 - 145 mmol/L   Potassium 3.7 3.5 - 5.1 mmol/L   Chloride 93 (L) 98 - 111 mmol/L   CO2 24 22 - 32 mmol/L   Glucose, Bld 128 (H) 70 - 99 mg/dL    Comment: Glucose reference range applies only to samples taken after fasting for at least 8 hours.   BUN 32 (H) 8 - 23 mg/dL   Creatinine, Ser 9.32 (H) 0.44 - 1.00 mg/dL   Calcium 35.5 (H) 8.9 - 10.3 mg/dL   Total Protein 6.9 6.5 - 8.1 g/dL   Albumin 3.7 3.5 - 5.0 g/dL   AST 35 15  - 41 U/L   ALT 24 0 - 44 U/L   Alkaline Phosphatase 60 38 - 126 U/L   Total Bilirubin 1.4 (H) 0.3 - 1.2 mg/dL   GFR, Estimated 45 (L) >60 mL/min    Comment: (NOTE) Calculated using the CKD-EPI Creatinine Equation (2021)    Anion gap 20 (H) 5 - 15    Comment: Performed at [facility], [address], [place], [place] [address]  TSH     Status: Abnormal   Collection Time: 04/22/23  4:26 PM  Result Value Ref Range   TSH 44.087 (H) 0.350 - 4.500 uIU/mL    Comment: Performed by a 3rd Generation assay with a functional sensitivity of <=0.01 uIU/mL. Performed at [facility], [address] [place], [place] [address]   Chloride, urine, random     Status: None   Collection Time: 04/22/23  4:26 PM  Result Value Ref Range   Chloride Urine 19 mmol/L    Comment: Performed at [facility], [address] [place], [place] [address]  Magnesium     Status: None   Collection Time: 04/22/23  4:26 PM  Result Value Ref Range   Magnesium 2.1 1.7 - 2.4 mg/dL    Comment: Performed at [facility], [address], [place], [place] [address]  Lactic acid, plasma     Status: None   Collection Time: 04/22/23  4:37 PM  Result Value Ref Range   Lactic Acid, Venous 1.7 0.5 - 1.9 mmol/L    Comment: Performed at [facility], [address], [place], [place] [address]  POC CBG, ED     Status: Abnormal   Collection Time: 04/22/23  4:40 PM  Result Value Ref Range   Glucose-Capillary 127 (H) 70 - 99 mg/dL    Comment: Glucose reference range applies only to samples taken after fasting for at least 8 hours.  I-Stat venous blood gas, ([facility], [facility], [facility])     Status: Abnormal   Collection Time: 04/22/23  5:36 PM  Result Value Ref Range   pH, Ven 7.597 (H) 7.25 - 7.43   pCO2, Ven 31.5 (L) 44 - 60 mmHg   pO2, Ven 180 (H) 32 - 45 mmHg   Bicarbonate 30.7 (H) 20.0 - 28.0 mmol/L   TCO2 32 22 - 32 mmol/L   O2 Saturation 100 %   Acid-Base Excess 9.0 (H) 0.0 - 2.0 mmol/L   Sodium 134 (L)  135 - 145 mmol/L   Potassium 3.5 3.5 - 5.1 mmol/L   Calcium, Ion 1.28 1.15 - 1.40 mmol/L   HCT 37.0 36.0 - 46.0 %   Hemoglobin 12.6 12.0 - 15.0 g/dL   Sample type VENOUS   CBG monitoring, ED     Status: Abnormal   Collection Time: 04/22/23  6:30 PM  Result Value Ref Range   Glucose-Capillary 157 (H) 70 - 99 mg/dL    Comment: Glucose reference range applies only to samples taken after fasting for at least 8 hours.  Lactic acid, plasma     Status: Abnormal   Collection Time: 04/22/23  8:39 PM  Result Value Ref Range   Lactic Acid, Venous 2.1 (HH) 0.5 - 1.9 mmol/L    Comment: CRITICAL RESULT CALLED TO, READ BACK BY AND VERIFIED WITH E.RAYMOND RN 2119 04/22/2023 BY G.GANADEN Performed at [facility], [address] [place], [place] [address]   T4, free     Status: Abnormal   Collection Time: 04/22/23  8:39 PM  Result Value Ref Range   Free T4 <0.25 (L) 0.61 - 1.12 ng/dL    Comment: (NOTE) Biotin ingestion may interfere with free T4 tests. If the results are inconsistent with the TSH level, previous test results, or the clinical presentation, then consider biotin interference. If needed, order repeat testing after stopping biotin. Performed at [facility], [address] [place], [place] [address]   Osmolality  Status: Abnormal   Collection Time: 04/22/23  8:39 PM  Result Value Ref Range   Osmolality 299 (H) 275 - 295 mOsm/kg    Comment: Performed at [facility], [address] [place], [place] [address]  CBG monitoring, ED     Status: Abnormal   Collection Time: 04/23/23  1:03 AM  Result Value Ref Range   Glucose-Capillary 278 (H) 70 - 99 mg/dL    Comment: Glucose reference range applies only to samples taken after fasting for at least 8 hours.  CBG monitoring, ED     Status: Abnormal   Collection Time: 04/23/23  2:28 AM  Result Value Ref Range   Glucose-Capillary 260 (H) 70 - 99 mg/dL    Comment: Glucose reference range applies only to samples taken  after fasting for at least 8 hours.  CBG monitoring, ED     Status: Abnormal   Collection Time: 04/23/23  3:36 AM  Result Value Ref Range   Glucose-Capillary 230 (H) 70 - 99 mg/dL    Comment: Glucose reference range applies only to samples taken after fasting for at least 8 hours.  CBG monitoring, ED     Status: Abnormal   Collection Time: 04/23/23  4:51 AM  Result Value Ref Range   Glucose-Capillary 207 (H) 70 - 99 mg/dL    Comment: Glucose reference range applies only to samples taken after fasting for at least 8 hours.  CBG monitoring, ED     Status: Abnormal   Collection Time: 04/23/23  6:27 AM  Result Value Ref Range   Glucose-Capillary 226 (H) 70 - 99 mg/dL    Comment: Glucose reference range applies only to samples taken after fasting for at least 8 hours.   DG Chest Portable 1 View  Result Date: 04/22/2023 CLINICAL DATA:  Altered mental status EXAM: PORTABLE CHEST 1 VIEW COMPARISON:  None Available. FINDINGS: The heart size and mediastinal contours are within normal limits. Both lungs are clear. The visualized skeletal structures are unremarkable. IMPRESSION: No active disease. Electronically Signed   By: Charlett Nose M.D.   On: 04/22/2023 19:08   CT Head Wo Contrast  Result Date: 04/22/2023 CLINICAL DATA:  Weakness, altered mental status EXAM: CT HEAD WITHOUT CONTRAST TECHNIQUE: Contiguous axial images were obtained from the base of the skull through the vertex without intravenous contrast. RADIATION DOSE REDUCTION: This exam was performed according to the departmental dose-optimization program which includes automated exposure control, adjustment of the mA and/or kV according to patient size and/or use of iterative reconstruction technique. COMPARISON:  Brain MRI 11/21/2019 FINDINGS: Brain: There is no acute intracranial hemorrhage, extra-axial fluid collection, or acute infarct. There is mild parenchymal volume loss with prominence of the ventricular system and extra-axial CSF  spaces. Hypodensity in the supratentorial white matter likely reflects underlying chronic small-vessel ischemic change. The pituitary and suprasellar region are normal. There is no mass lesion. There is no mass effect or midline shift. Vascular: There is calcification of the bilateral carotid siphons. Skull: Normal. Negative for fracture or focal lesion. Sinuses/Orbits: The paranasal sinuses are clear. A right glaucoma valve and bilateral lens implants are noted. There is peripheral calcification and atrophy of the left globe which may reflect developing phthisis bulbi. Other: The mastoid air cells and middle ear cavities are clear. IMPRESSION: No acute intracranial pathology. Electronically Signed   By: Lesia Hausen M.D.   On: 04/22/2023 18:49    Pending Labs [employer] (From admission, onward)     Start  Ordered   04/23/23 0500  Basic metabolic panel  Tomorrow morning,   R        04/22/23 2344   04/23/23 0500  CBC  Tomorrow morning,   R        04/22/23 2344   04/23/23 0500  Magnesium  Tomorrow morning,   R        04/22/23 2344   04/23/23 0500  Basic metabolic panel  (Hyperglycemia (not DKA or HHS))  Daily,   R      04/23/23 0055   04/23/23 0049  Culture, blood (single) w Reflex to ID Panel  Once,   R        04/23/23 0049   04/22/23 2342  HIV Antibody (routine testing w rflx)  (HIV Antibody (Routine testing w reflex) panel)  Once,   R        04/22/23 2344   04/22/23 2238  T3  Add-on,   AD        04/22/23 2238   04/22/23 1629  Urine Culture  Once,   URGENT       Question:  Indication  Answer:  Altered mental status (if no other cause identified)   04/22/23 1629            Vitals/Pain Today's Vitals   04/23/23 0230 04/23/23 0233 04/23/23 0240 04/23/23 0500  BP: (!) 90/52 (!) 105/55 (!) 105/53 (!) 102/58  Pulse: 60 64 61 (!) 59  Resp: 15 17 14 11   Temp:   97.6 F (36.4 C)   TempSrc:   Oral   SpO2: 100% 98% 99% 97%  Weight:      Height:      PainSc:        Isolation  Precautions No active isolations  Medications Medications  enoxaparin (LOVENOX) injection 40 mg (has no administration in time range)  acetaminophen (TYLENOL) tablet 650 mg (has no administration in time range)    Or  acetaminophen (TYLENOL) suppository 650 mg (has no administration in time range)  ondansetron (ZOFRAN) tablet 4 mg (has no administration in time range)    Or  ondansetron (ZOFRAN) injection 4 mg (has no administration in time range)  docusate sodium (COLACE) capsule 100 mg (has no administration in time range)  sorbitol 70 % solution 30 mL (has no administration in time range)  hydrocortisone sodium succinate (SOLU-CORTEF) 100 MG injection 100 mg (100 mg Intravenous Given 04/23/23 0120)  levothyroxine (SYNTHROID, LEVOTHROID) injection 100 mcg (has no administration in time range)  insulin regular, human (MYXREDLIN) 100 units/ 100 mL infusion (6.5 Units/hr Intravenous New Bag/Given 04/23/23 0134)  lactated ringers infusion (0 mLs Intravenous Stopped 04/23/23 0347)  dextrose 5 % in lactated ringers infusion ( Intravenous New Bag/Given 04/23/23 0347)  dextrose 50 % solution 0-50 mL (has no administration in time range)  lactated ringers bolus 1,000 mL (0 mLs Intravenous Stopped 04/22/23 1932)  levothyroxine (SYNTHROID, LEVOTHROID) injection 100 mcg (100 mcg Intravenous Given 04/22/23 2351)    Mobility unknown     Focused Assessments    R Recommendations: See Admitting Provider Note  Report given to:   Additional Notes:

## 2023-04-23 NOTE — Progress Notes (Addendum)
EEG complete - results pending.   Patient hair matted severely. RN aware and if EEG needs to be repeated then explained to RN hair will need to be removed.

## 2023-04-23 NOTE — ED Notes (Signed)
Pt given water to drink, pt was able to swallow well and follow commands without any complications

## 2023-04-23 NOTE — Inpatient Diabetes Management (Signed)
Inpatient Diabetes Program Recommendations  AACE/ADA: New Consensus Statement on Inpatient Glycemic Control (2015)  Target Ranges:  Prepandial:   less than 140 mg/dL      Peak postprandial:   less than 180 mg/dL (1-2 hours)      Critically ill patients:  140 - 180 mg/dL   Lab Results  Component Value Date   GLUCAP 180 (H) 04/23/2023   HGBA1C 10.0 (A) 03/19/2021    Review of Glycemic Control  Latest Reference Range & Units 04/23/23 08:42  Glucose-Capillary 70 - 99 mg/dL 784 (H)  (H): Data is abnormally high Diabetes history: Type 1 DM Outpatient Diabetes medications: Humalog insulin pump (Not applied) Current orders for Inpatient glycemic control: IV insulin, Semglee 20 units every day, Novolog 3 units TID, Novolog 0-15 units TID, Novolog 0-5 units at bedtime Solucortef 75 mg BID  Inpatient Diabetes Program Recommendations:   A1C pending. Noted consult. Patient with AMS. Has seen Dr Everardo All, outpatient endocrinology in the past (last visit 2022). No note on previous insulin pump settings. CBGs on admission were within target range. Per MD note, not currently wearing pump.  Will follow.   Thanks, Lujean Rave, MSN, RNC-OB Diabetes Coordinator [phone] (8a-5p)

## 2023-04-23 NOTE — Evaluation (Signed)
Physical Therapy Evaluation Patient Details Name: JAHLISA ROSSITTO MRN: [id number] DOB: 26-Mar-1956 Today's Date: 04/23/2023  History of Present Illness  67 y.o. female who presents for altered mental status. W/U revealed metabolic encephalopathy.  PMH includes: DM type 1, glaucoma, HTN, keukopenia, diabetic retinopathy.  Clinical Impression  Patient presents with dependencies in transfers and mobility due to generalized weakness, decreased strength/ROM/coordination of movements and decreased balance.  Patient has been living at home with husband as primary caretaker.  Patient will benefit from continued PT to progress mobility and independence with mobility. Unsure if husband able to continue to care for patient.  Patient will need continued PT post discharge.          If plan is discharge home, recommend the following: A lot of help with walking and/or transfers;A lot of help with bathing/dressing/bathroom;Assistance with cooking/housework;Assistance with feeding;Help with stairs or ramp for entrance;Direct supervision/assist for medications management;Direct supervision/assist for financial management;Assist for transportation;Supervision due to cognitive status   Can travel by private vehicle        Equipment Recommendations None recommended by PT  Recommendations for Other Services       Functional Status Assessment Patient has had a recent decline in their functional status and demonstrates the ability to make significant improvements in function in a reasonable and predictable amount of time.     Precautions / Restrictions Precautions Precautions: Fall Restrictions Weight Bearing Restrictions: No      Mobility  Bed Mobility Overal bed mobility: Needs Assistance Bed Mobility: Supine to Sit, Sit to Supine, Rolling Rolling: Mod assist   Supine to sit: Mod assist Sit to supine: Mod assist        Transfers                        Ambulation/Gait                   Stairs            Wheelchair Mobility     Tilt Bed    Modified Rankin (Stroke Patients Only)       Balance Overall balance assessment: Needs assistance Sitting-balance support: No upper extremity supported, Feet supported Sitting balance-Leahy Scale: Poor Sitting balance - Comments: pt pushing posteriorly during sitting                                     Pertinent Vitals/Pain Pain Assessment Pain Assessment: No/denies pain    Home Living Family/patient expects to be discharged to:: Private residence Living Arrangements: Spouse/significant other Available Help at Discharge: Family;Available 24 hours/day Type of Home: House Home Access: Stairs to enter Entrance Stairs-Rails: Left Entrance Stairs-Number of Steps: 5   Home Layout: Multi-level;Able to live on main level with bedroom/bathroom Home Equipment: Rolling Walker (2 wheels)      Prior Function Prior Level of Function : Needs assist       Physical Assist : Mobility (physical)     Mobility Comments: per husband, patient typically stays in bed and ambulates to bathroom as needed.  Typically ambulates without device and does have RW when needed;  cannot ambulate much further than bathroom.       Extremity/Trunk Assessment   Upper Extremity Assessment Upper Extremity Assessment: Defer to OT evaluation    Lower Extremity Assessment Lower Extremity Assessment: Difficult to assess due to impaired cognition  Communication   Communication Communication: Difficulty following commands/understanding Following commands: Follows one step commands inconsistently  Cognition Arousal: Lethargic Behavior During Therapy: Flat affect Overall Cognitive Status: No family/caregiver present to determine baseline cognitive functioning                                          General Comments      Exercises     Assessment/Plan    PT Assessment Patient needs  continued PT services  PT Problem List Decreased strength;Decreased range of motion;Decreased activity tolerance;Decreased balance;Decreased mobility;Decreased coordination;Decreased cognition       PT Treatment Interventions DME instruction;Gait training;Functional mobility training;Therapeutic activities;Balance training;Therapeutic exercise;Patient/family education    PT Goals (Current goals can be found in the Care Plan section)  Acute Rehab PT Goals Patient Stated Goal: none stated PT Goal Formulation: With patient/family Time For Goal Achievement: 05/07/23 Potential to Achieve Goals: Fair    Frequency Min 1X/week     Co-evaluation               AM-PAC PT "6 Clicks" Mobility  Outcome Measure Help needed turning from your back to your side while in a flat bed without using bedrails?: A Lot Help needed moving from lying on your back to sitting on the side of a flat bed without using bedrails?: A Lot Help needed moving to and from a bed to a chair (including a wheelchair)?: A Lot Help needed standing up from a chair using your arms (e.g., wheelchair or bedside chair)?: A Lot Help needed to walk in hospital room?: A Lot Help needed climbing 3-5 steps with a railing? : A Lot 6 Click Score: 12    End of Session   Activity Tolerance: Patient tolerated treatment well Patient left: in bed;with call bell/phone within reach;with bed alarm set   PT Visit Diagnosis: Other abnormalities of gait and mobility (R26.89);Muscle weakness (generalized) (M62.81);Difficulty in walking, not elsewhere classified (R26.2)    Time: 1610-9604 PT Time Calculation (min) (ACUTE ONLY): 22 min   Charges:   PT Evaluation $PT Eval Moderate Complexity: 1 Mod   PT General Charges $$ ACUTE PT VISIT: 1 Visit         04/23/2023 Delray Alt, PT Acute Rehabilitation Services Office:  [phone]   Olivia Canter 04/23/2023, 4:32 PM

## 2023-04-23 NOTE — ED Notes (Signed)
Attempted to draw morning labs and was unsuccessful. Neither IV would draw back blood. Both Acs were tried and could not find an area to draw labs. Undated lab as well.

## 2023-04-23 NOTE — Progress Notes (Addendum)
PROGRESS NOTE  Katherine Navarro MWU:[id number] DOB: 10-21-1955   PCP: Jarold Motto, PA  Patient is from: Home.  DOA: 04/22/2023 LOS: 1  Chief complaints Chief Complaint  Patient presents with   Weakness     Brief Narrative / Interim history: 67 year old F with PMH of DM-1 supposed to be on insulin pump, hypothyroidism and HTN brought to ED with progressive sleepiness over weeks, and admitted for metabolic encephalopathy and G in the setting of severe hypothyroidism with elevated TSH to 44 and undetectable free T4.  Patient has not taken her medications for a long period of time.  Per chart review, last endocrinology visit in 2022.  She was supposed to be on insulin pump.  In ED, vital stable.  Afebrile.  VBG with alkalosis.Cr 1.32.  BUN 32.  Calcium 12.0.  Ionized calcium 1.28.  Lactic acid 2.1.  CBC and CT head without significant finding.  UA with 5 ketones and 50 glucose.  Patient was started on IV Synthroid, IV Solu-Cortef, insulin drip and IV fluid and admitted for further evaluation and care.  Patient was not in DKA or HHS.  The next day, remained lethargic.  She wakes to voice and only oriented to self.  Not answering other questions.  Does not follow commands.  Continued on IV Synthroid.  Subjective: Seen and examined earlier and later this morning.  Patient is sleepy but wakes to voice.  She responds to greeting but does not answer other questions other than her name.  She does not follow command.  Does not appear to be in distress.  Objective: Vitals:   04/23/23 0636 04/23/23 0643 04/23/23 0830 04/23/23 1223  BP:   117/66 120/64  Pulse:   62 63  Resp:  13 18 14   Temp: 97.9 F (36.6 C)  98.4 F (36.9 C) 98.2 F (36.8 C)  TempSrc: Oral  Oral Oral  SpO2:   98%   Weight:      Height:        Examination:  GENERAL: No apparent distress.  Nontoxic. HEENT: MMM.  Hearing grossly intact.  Blind from left eye. NECK: Supple.  No apparent JVD.  RESP:  No IWOB.  Fair  aeration bilaterally. CVS:  RRR. Heart sounds normal.  ABD/GI/GU: BS+. Abd soft, NTND.  MSK/EXT:  Moves extremities. No apparent deformity. No edema.  SKIN: no apparent skin lesion or wound NEURO: Sleepy but wakes to voice.  Somewhat lethargic.  Oriented to self.  No apparent focal neuro deficit but does not follow commands.. PSYCH: Calm. Normal affect.   Procedures:  None  Microbiology summarized: Blood and urine cultures pending.  Assessment and plan: Acute metabolic encephalopathy/lethargy: Likely due to severe hypothyroidism in the setting of noncompliance.  Low suspicion for infection.  She is not febrile.  She has no leukocytosis either.  She has no focal neurodeficit but limited exam as patient was not following commands.  CT head without acute finding.  VBG with alkalosis. -Treat hypothyroidism as below. -Basic encephalopathy workup including repeat VBG, EEG, ammonia, UDS, RPR, B12 and basic labs -Follow cultures. -Aspiration and delirium precautions -SLP/PT/OT  Severe hypothyroidism: TSH 44.  Free T4 undetectable.  Patient has not taken her Synthroid for long.  Per chart review, last endocrinology follow-up in 2022.  Supposed to be on Synthroid 112 mcg daily. -IV levothyroxine 100 mcg daily -Decrease IV Solu-Cortef to 75 mg twice daily.   Uncontrolled DM-1 with hyperglycemia: Not in DKA or HHS.  Supposed to be on insulin pump.  A1c 10.0% in 2022.  Initially started on insulin drip.  Transitioned to subcu insulin this morning. Recent Labs  Lab 04/23/23 0336 04/23/23 0451 04/23/23 0627 04/23/23 0842 04/23/23 1221  GLUCAP 230* 207* 226* 180* 96  -Hemoglobin A1c -Decrease SSI to sensitive -Discontinue meal coverage -Continue NovoLog 20 units daily-has already received this morning. -Appreciate input by diabetic coordinator.  CKD-3A.  Cr slightly higher than baseline but does not meet criteria for AKI.  AKI ruled out. -Continue IV fluid   Hypomagnesemia -Monitor  replenish as appropriate  Body mass index is 28.47 kg/m.          DVT prophylaxis:  enoxaparin (LOVENOX) injection 40 mg Start: 04/23/23 1000  Code Status: Full code Family Communication: Attempted to call patient's husband over the phone multiple times but no answer. Level of care: Progressive Status is: Inpatient Remains inpatient appropriate because: Acute encephalopathy, severe hypothyroidism   Final disposition: TBD Consultants:  None  45 minutes with more than 50% spent in reviewing records, counseling patient/family and coordinating care.   Sch Meds:  Scheduled Meds:  docusate sodium  100 mg Oral BID   enoxaparin (LOVENOX) injection  40 mg Subcutaneous Daily   hydrocortisone sod succinate (SOLU-CORTEF) inj  75 mg Intravenous BID   insulin aspart  0-5 Units Subcutaneous QHS   insulin aspart  0-9 Units Subcutaneous TID WC   insulin glargine-yfgn  20 Units Subcutaneous Daily   levothyroxine  100 mcg Intravenous Daily   Continuous Infusions:  insulin Stopped (04/23/23 1049)   lactated ringers Stopped (04/23/23 0347)   magnesium sulfate bolus IVPB     PRN Meds:.acetaminophen **OR** acetaminophen, dextrose, ondansetron **OR** ondansetron (ZOFRAN) IV, sorbitol  Antimicrobials: Anti-infectives (From admission, onward)    None        I have personally reviewed the following labs and images: CBC: Recent Labs  Lab 04/22/23 1626 04/22/23 1736  WBC 6.7  --   NEUTROABS 4.8  --   HGB 11.8* 12.6  HCT 35.7* 37.0  MCV 85.6  --   PLT 166  --    BMP &GFR Recent Labs  Lab 04/22/23 1626 04/22/23 1736 04/23/23 0903  NA 137 134*  --   K 3.7 3.5  --   CL 93*  --   --   CO2 24  --   --   GLUCOSE 128*  --   --   BUN 32*  --   --   CREATININE 1.32*  --   --   CALCIUM 12.0*  --   --   MG 2.1  --  0.8*   Estimated Creatinine Clearance: 40.6 mL/min (A) (by C-G formula based on SCr of 1.32 mg/dL (H)). Liver & Pancreas: Recent Labs  Lab 04/22/23 1626  AST  35  ALT 24  ALKPHOS 60  BILITOT 1.4*  PROT 6.9  ALBUMIN 3.7   No results for input(s): "LIPASE", "AMYLASE" in the last 168 hours. No results for input(s): "AMMONIA" in the last 168 hours. Diabetic: No results for input(s): "HGBA1C" in the last 72 hours. Recent Labs  Lab 04/23/23 0336 04/23/23 0451 04/23/23 0627 04/23/23 0842 04/23/23 1221  GLUCAP 230* 207* 226* 180* 96   Cardiac Enzymes: No results for input(s): "CKTOTAL", "CKMB", "CKMBINDEX", "TROPONINI" in the last 168 hours. No results for input(s): "PROBNP" in the last 8760 hours. Coagulation Profile: No results for input(s): "INR", "PROTIME" in the last 168 hours. Thyroid Function Tests: Recent Labs    04/22/23 1626 04/22/23 2039  TSH 44.087*  --  FREET4  --  <0.25*   Lipid Profile: No results for input(s): "CHOL", "HDL", "LDLCALC", "TRIG", "CHOLHDL", "LDLDIRECT" in the last 72 hours. Anemia Panel: No results for input(s): "VITAMINB12", "FOLATE", "FERRITIN", "TIBC", "IRON", "RETICCTPCT" in the last 72 hours. Urine analysis:    Component Value Date/Time   COLORURINE YELLOW 04/22/2023 1626   APPEARANCEUR CLEAR 04/22/2023 1626   LABSPEC 1.015 04/22/2023 1626   PHURINE 6.0 04/22/2023 1626   GLUCOSEU 50 (A) 04/22/2023 1626   GLUCOSEU >=1000 (A) 12/16/2017 1110   HGBUR NEGATIVE 04/22/2023 1626   HGBUR moderate 02/01/2008 0902   BILIRUBINUR NEGATIVE 04/22/2023 1626   KETONESUR 5 (A) 04/22/2023 1626   PROTEINUR NEGATIVE 04/22/2023 1626   UROBILINOGEN 0.2 12/16/2017 1110   NITRITE NEGATIVE 04/22/2023 1626   LEUKOCYTESUR NEGATIVE 04/22/2023 1626   Sepsis Labs: Invalid input(s): "PROCALCITONIN", "LACTICIDVEN"  Microbiology: No results found for this or any previous visit (from the past 240 hour(s)).  Radiology Studies: DG Chest Portable 1 View  Result Date: 04/22/2023 CLINICAL DATA:  Altered mental status EXAM: PORTABLE CHEST 1 VIEW COMPARISON:  None Available. FINDINGS: The heart size and mediastinal  contours are within normal limits. Both lungs are clear. The visualized skeletal structures are unremarkable. IMPRESSION: No active disease. Electronically Signed   By: Charlett Nose M.D.   On: 04/22/2023 19:08   CT Head Wo Contrast  Result Date: 04/22/2023 CLINICAL DATA:  Weakness, altered mental status EXAM: CT HEAD WITHOUT CONTRAST TECHNIQUE: Contiguous axial images were obtained from the base of the skull through the vertex without intravenous contrast. RADIATION DOSE REDUCTION: This exam was performed according to the departmental dose-optimization program which includes automated exposure control, adjustment of the mA and/or kV according to patient size and/or use of iterative reconstruction technique. COMPARISON:  Brain MRI 11/21/2019 FINDINGS: Brain: There is no acute intracranial hemorrhage, extra-axial fluid collection, or acute infarct. There is mild parenchymal volume loss with prominence of the ventricular system and extra-axial CSF spaces. Hypodensity in the supratentorial white matter likely reflects underlying chronic small-vessel ischemic change. The pituitary and suprasellar region are normal. There is no mass lesion. There is no mass effect or midline shift. Vascular: There is calcification of the bilateral carotid siphons. Skull: Normal. Negative for fracture or focal lesion. Sinuses/Orbits: The paranasal sinuses are clear. A right glaucoma valve and bilateral lens implants are noted. There is peripheral calcification and atrophy of the left globe which may reflect developing phthisis bulbi. Other: The mastoid air cells and middle ear cavities are clear. IMPRESSION: No acute intracranial pathology. Electronically Signed   By: Lesia Hausen M.D.   On: 04/22/2023 18:49      Terrika Zuver T. Zakariya Knickerbocker [facility]  If 7PM-7AM, please contact night-coverage [email or web address] 04/23/2023, 12:32 PM

## 2023-04-23 NOTE — H&P (Addendum)
History and Physical    Patient: Katherine Navarro:[id number] DOB: 1956/06/15 DOA: 04/22/2023 DOS: the patient was seen and examined on 04/23/2023 PCP: Jarold Motto, PA  Patient coming from: Home  Chief Complaint:  Chief Complaint  Patient presents with   Weakness   HPI: Katherine Navarro is a 67 y.o. female with medical history significant for hypothyroidism, type 1 diabetes mellitus who is family called EMS today because of progressive sleepiness over weeks.  The history comes from the ED provider and the ED per providers history comes from EMS as the patient's family was not able to be reached. Patient was extremely drowsy in the emergency department but when I asked her how long has it been since she took her thyroid medicine she responded "long time"    Review of Systems: unable to review all systems due to the inability of the patient to answer questions. Past Medical History:  Diagnosis Date   Complication of anesthesia    trouble waking up   CONSTIPATION, CHRONIC 12/13/2007   DIABETES MELLITUS, TYPE I 01/07/2007   DM nephropathy/sclerosis    GLAUCOMA 12/13/2007   HYPERCHOLESTEROLEMIA 12/13/2007   Hypertension    Hyperthyroidism    INTERNAL HEMORRHOIDS 07/23/2008   LEUKOPENIA, CHRONIC 12/13/2007   Nonproliferative diabetic retinopathy NOS(362.03) 12/13/2007   Unspecified hypothyroidism 12/13/2007   VARICOSE VEINS, LOWER EXTREMITIES 12/13/2007   Past Surgical History:  Procedure Laterality Date   ANTERIOR CERVICAL DECOMP/DISCECTOMY FUSION N/A 02/06/2020   Procedure: Anterior Cervical Decompression Discectomy Fusion Cervical five-six;  Surgeon: Donalee Citrin, MD;  Location[facility] OR;  Service: Neurosurgery;  Laterality: N/A;   ELECTROCARDIOGRAM  08/24/2006   EYE SURGERY     bilateral   LEEP N/A 12/01/2012   Procedure: LOOP ELECTROSURGICAL EXCISION PROCEDURE (LEEP);  Surgeon: Purcell Nails, MD;  Location: [facility] ORS;  Service: Gynecology;  Laterality: N/A;   Stress Myoview    05/18/2004   TUBAL LIGATION     Social History:  reports that she has never smoked. She has never used smokeless tobacco. She reports current alcohol use of about 1.0 standard drink of alcohol per week. She reports that she does not use drugs.  Allergies  Allergen Reactions   Atorvastatin     REACTION: perceived myalgias   Ciprofloxacin Nausea And Vomiting   Epinephrine     REACTION: thyroid problems   Erythromycin     Can not recall   Morphine Nausea Only    Headache   Penicillins Hives    Family History  Problem Relation Age of Onset   Cancer Mother        Breast Cancer   Diabetes Mother    Breast cancer Mother    Diabetes Father     Prior to Admission medications   Medication Sig Start Date End Date Taking? Authorizing Provider  alum & mag hydroxide-simeth (ALMACONE DOUBLE STRENGTH) 400-400-40 MG/5ML suspension Take 10 mLs by mouth as needed for indigestion. 06/07/11  Yes [provider]  aspirin EC 81 MG tablet Take 81 mg by mouth daily. 06/07/11  Yes [provider]  Calcium Carb-Cholecalciferol 500-15 MG-MCG TABS Take 1 tablet by mouth daily. 06/07/11  Yes [provider]  Carboxymethylcell-Hypromellose 0.25-0.3 % GEL Apply 1 drop to eye daily as needed (For dry eyes). 06/07/11  Yes [provider]  folic acid (FOLVITE) 1 MG tablet Take 1 mg by mouth daily. 05/25/13  Yes [provider]  GARLIC PO Take 1 capsule by mouth daily. 05/25/13  Yes [provider]  hydrocortisone-pramoxine (ANALPRAM-HC) 2.5-1 % rectal cream Place 1 Application rectally 2 (two) times daily as needed for hemorrhoids or anal itching. 05/31/11  Yes [provider]  magnesium (MAGTAB) 84 MG ( ) TBCR SR tablet Take 84 mg by mouth daily. 06/07/11  Yes [provider]  Accu-Chek Softclix Lancets lancets Use as instructed 06/23/21   Romero Belling, MD  acetaZOLAMIDE ER (DIAMOX) 500 MG capsule Take 500 mg by mouth 2 (two) times daily.     [provider]  amiloride-hydrochlorothiazide (MODURETIC) 5-50 MG tablet Take 0.5 tablets by mouth daily. 10/31/19 02/01/20  Jarold Motto, PA  brimonidine (ALPHAGAN P) 0.1 % SOLN Place 1 drop into both eyes daily as needed (red/irritated eyes).    [provider]  cyclobenzaprine (FLEXERIL) 10 MG tablet Take 1 tablet (10 mg total) by mouth 3 (three) times daily as needed for muscle spasms. 02/08/20   Meyran, Tiana Loft, NP  dorzolamide-timolol (COSOPT) 22.3-6.8 MG/ML ophthalmic solution Place 1 drop into both eyes daily.     [provider]  glucose blood (ACCU-CHEK GUIDE) test strip 1 each by Other route See admin instructions. Check 5 times per day, and lancets 5/day 03/19/21   Romero Belling, MD  Insulin Infusion Pump Supplies (MINIMED INFUSION SET-MMT 396) MISC 1 Device by Other route every other day. 03/19/21   Romero Belling, MD  Insulin Infusion Pump Supplies (PARADIGM RESERVOIR ) MISC 1 Device by Other route every other day. 12/18/20   Romero Belling, MD  insulin lispro (HUMALOG) 100 UNIT/ML injection Via pump, total of 50 units per day 12/18/20   Romero Belling, MD  levothyroxine (SYNTHROID) 112 MCG tablet Take 1 tablet (112 mcg total) by mouth daily before breakfast. 03/19/21   Romero Belling, MD    Physical Exam: Vitals:   04/22/23 2345 04/23/23 0000 04/23/23 0006 04/23/23 0023  BP:  128/64    Pulse: 64     Resp: 10     Temp:    97.9 F (36.6 C)  TempSrc:    Oral  SpO2: 100%     Weight:   72.9 kg   Height:   5\' 3"  (1.6 m)    Physical Exam:  General: No acute distress, well developed, well nourished HEENT: face looks doughy, atraumatic, left pupil is clouded over, rt pupil is surgically irregular Cardiovascular: Normal rate and rhythm. Distal pulses faint Pulmonary: Normal pulmonary effort, normal breath sounds Gastrointestinal: Distended abdomen, full, mild tenderness diffusely, hypoactive bowel sounds Musculoskeletal:No lower ext  edema Lymphadenopathy: No cervical LAD. Skin: Skin is warm and dry. Neuro: Very slow to respond to questions and commands.  Diffusely weak.  When I asked her to squeeze my fingers she barely curls them in. PSYCH: lethargic    Data Reviewed:  Results for orders placed or performed during the hospital encounter of 04/22/23 (from the past 24 hour(s))  Urinalysis, Routine w reflex microscopic -Urine, Clean Catch     Status: Abnormal   Collection Time: 04/22/23  4:26 PM  Result Value Ref Range   Color, Urine YELLOW YELLOW   APPearance CLEAR CLEAR   Specific Gravity, Urine 1.015 1.005 - 1.030   pH 6.0 5.0 - 8.0   Glucose, UA 50 (A) NEGATIVE mg/dL   Hgb urine dipstick NEGATIVE NEGATIVE   Bilirubin Urine NEGATIVE NEGATIVE   Ketones, ur 5 (A) NEGATIVE mg/dL   Protein, ur NEGATIVE NEGATIVE mg/dL   Nitrite NEGATIVE NEGATIVE   Leukocytes,Ua NEGATIVE NEGATIVE  CBC with Differential  Status: Abnormal   Collection Time: 04/22/23  4:26 PM  Result Value Ref Range   WBC 6.7 4.0 - 10.5 K/uL   RBC 4.17 3.87 - 5.11 MIL/uL   Hemoglobin 11.8 (L) 12.0 - 15.0 g/dL   HCT 01.0 (L) 27.2 - 53.6 %   MCV 85.6 80.0 - 100.0 fL   MCH 28.3 26.0 - 34.0 pg   MCHC 33.1 30.0 - 36.0 g/dL   RDW 64.4 03.4 - 74.2 %   Platelets 166 150 - 400 K/uL   nRBC 0.0 0.0 - 0.2 %   Neutrophils Relative % 72 %   Neutro Abs 4.8 1.7 - 7.7 K/uL   Lymphocytes Relative 17 %   Lymphs Abs 1.1 0.7 - 4.0 K/uL   Monocytes Relative 10 %   Monocytes Absolute 0.7 0.1 - 1.0 K/uL   Eosinophils Relative 1 %   Eosinophils Absolute 0.1 0.0 - 0.5 K/uL   Basophils Relative 0 %   Basophils Absolute 0.0 0.0 - 0.1 K/uL   Immature Granulocytes 0 %   Abs Immature Granulocytes 0.03 0.00 - 0.07 K/uL  Comprehensive metabolic panel     Status: Abnormal   Collection Time: 04/22/23  4:26 PM  Result Value Ref Range   Sodium 137 135 - 145 mmol/L   Potassium 3.7 3.5 - 5.1 mmol/L   Chloride 93 (L) 98 - 111 mmol/L   CO2 24 22 - 32 mmol/L   Glucose,  Bld 128 (H) 70 - 99 mg/dL   BUN 32 (H) 8 - 23 mg/dL   Creatinine, Ser 5.95 (H) 0.44 - 1.00 mg/dL   Calcium 63.8 (H) 8.9 - 10.3 mg/dL   Total Protein 6.9 6.5 - 8.1 g/dL   Albumin 3.7 3.5 - 5.0 g/dL   AST 35 15 - 41 U/L   ALT 24 0 - 44 U/L   Alkaline Phosphatase 60 38 - 126 U/L   Total Bilirubin 1.4 (H) 0.3 - 1.2 mg/dL   GFR, Estimated 45 (L) >60 mL/min   Anion gap 20 (H) 5 - 15  TSH     Status: Abnormal   Collection Time: 04/22/23  4:26 PM  Result Value Ref Range   TSH 44.087 (H) 0.350 - 4.500 uIU/mL  Chloride, urine, random     Status: None   Collection Time: 04/22/23  4:26 PM  Result Value Ref Range   Chloride Urine 19 mmol/L  Magnesium     Status: None   Collection Time: 04/22/23  4:26 PM  Result Value Ref Range   Magnesium 2.1 1.7 - 2.4 mg/dL  Lactic acid, plasma     Status: None   Collection Time: 04/22/23  4:37 PM  Result Value Ref Range   Lactic Acid, Venous 1.7 0.5 - 1.9 mmol/L  POC CBG, ED     Status: Abnormal   Collection Time: 04/22/23  4:40 PM  Result Value Ref Range   Glucose-Capillary 127 (H) 70 - 99 mg/dL  I-Stat venous blood gas, ([facility] ED, [facility], [facility])     Status: Abnormal   Collection Time: 04/22/23  5:36 PM  Result Value Ref Range   pH, Ven 7.597 (H) 7.25 - 7.43   pCO2, Ven 31.5 (L) 44 - 60 mmHg   pO2, Ven 180 (H) 32 - 45 mmHg   Bicarbonate 30.7 (H) 20.0 - 28.0 mmol/L   TCO2 32 22 - 32 mmol/L   O2 Saturation 100 %   Acid-Base Excess 9.0 (H) 0.0 - 2.0 mmol/L   Sodium 134 (  L) 135 - 145 mmol/L   Potassium 3.5 3.5 - 5.1 mmol/L   Calcium, Ion 1.28 1.15 - 1.40 mmol/L   HCT 37.0 36.0 - 46.0 %   Hemoglobin 12.6 12.0 - 15.0 g/dL   Sample type VENOUS   CBG monitoring, ED     Status: Abnormal   Collection Time: 04/22/23  6:30 PM  Result Value Ref Range   Glucose-Capillary 157 (H) 70 - 99 mg/dL  Lactic acid, plasma     Status: Abnormal   Collection Time: 04/22/23  8:39 PM  Result Value Ref Range   Lactic Acid, Venous 2.1 (HH) 0.5 - 1.9 mmol/L  T4, free      Status: Abnormal   Collection Time: 04/22/23  8:39 PM  Result Value Ref Range   Free T4 <0.25 (L) 0.61 - 1.12 ng/dL  Osmolality     Status: Abnormal   Collection Time: 04/22/23  8:39 PM  Result Value Ref Range   Osmolality 299 (H) 275 - 295 mOsm/kg     Assessment and Plan: Severe hypothyroidism - though she definitely has decreased mental status she does not have hypothermia or hypotension or bradycardia which would signify a medical emergency.  - IV levothyroxine.  Will started at 100 mcg daily - Hydrocortisone 100 mg IV Q8 as recommended to avoid precipitating adrenal insufficiency - Monitor  2. Type 1 DM- The patient has had an insulin pump in the past but no pump was attached to her body on arrival.  It is unclear what she has been using recently.  Her pH is 7.54 so she is alkalotic rather than acidotic.  Given her history however we will put her on IV insulin per Endo tool for now.  3. AKI - IV fluids.  Monitor  Unclear if she is taking any other routine meds.   Advance Care Planning:   Code Status: Full Code   Consults: none  Family Communication: none  Severity of Illness: The appropriate patient status for this patient is INPATIENT. Inpatient status is judged to be reasonable and necessary in order to provide the required intensity of service to ensure the patient's safety. The patient's presenting symptoms, physical exam findings, and initial radiographic and laboratory data in the context of their chronic comorbidities is felt to place them at high risk for further clinical deterioration. Furthermore, it is not anticipated that the patient will be medically stable for discharge from the hospital within 2 midnights of admission.   * I certify that at the point of admission it is my clinical judgment that the patient will require inpatient hospital care spanning beyond 2 midnights from the point of admission due to high intensity of service, high risk for further deterioration  and high frequency of surveillance required.*  Author: Buena Irish, MD 04/23/2023 12:28 AM  For on call review [email or web address].

## 2023-04-23 NOTE — Procedures (Signed)
Patient Name: Katherine Navarro  MRN: [id number]  Epilepsy Attending: Charlsie Quest  Referring Physician/Provider: Almon Hercules, MD  Date:04/23/2023 Duration: 24.50 mins  Patient history: 67yo F with ams getting eeg to evaluate for seizure  Level of alertness: Awake  AEDs during EEG study: None  Technical aspects: This EEG study was done with scalp electrodes positioned according to the 10-20 International system of electrode placement. Electrical activity was reviewed with band pass filter of 1-70Hz , sensitivity of 7 uV/mm, display speed of 86mm/sec with a 60Hz  notched filter applied as appropriate. EEG data were recorded continuously and digitally stored.  Video monitoring was available and reviewed as appropriate.  Description: The posterior dominant rhythm consists of 8 Hz activity of moderate voltage (25-35 uV) seen predominantly in posterior head regions, symmetric and reactive to eye opening and eye closing. EEG showed intermittent generalized 3 to 6 Hz theta-delta slowing. Hyperventilation and photic stimulation were not performed.     ABNORMALITY - Intermittent slow, generalized  IMPRESSION: This study is suggestive of mild diffuse encephalopathy. No seizures or epileptiform discharges were seen throughout the recording.  Devi Hopman Annabelle Harman

## 2023-04-24 DIAGNOSIS — E039 Hypothyroidism, unspecified: Secondary | ICD-10-CM

## 2023-04-24 LAB — COMPREHENSIVE METABOLIC PANEL
ALT: 20 U/L (ref 0–44)
AST: 24 U/L (ref 15–41)
Albumin: 3 g/dL — ABNORMAL LOW (ref 3.5–5.0)
Alkaline Phosphatase: 57 U/L (ref 38–126)
Anion gap: 13 (ref 5–15)
BUN: 25 mg/dL — ABNORMAL HIGH (ref 8–23)
CO2: 26 mmol/L (ref 22–32)
Calcium: 10.1 mg/dL (ref 8.9–10.3)
Chloride: 98 mmol/L (ref 98–111)
Creatinine, Ser: 1.17 mg/dL — ABNORMAL HIGH (ref 0.44–1.00)
GFR, Estimated: 52 mL/min — ABNORMAL LOW (ref >60)
Glucose, Bld: 368 mg/dL — ABNORMAL HIGH (ref 70–99)
Potassium: 3.7 mmol/L (ref 3.5–5.1)
Sodium: 137 mmol/L (ref 135–145)
Total Bilirubin: 0.9 mg/dL (ref 0.3–1.2)
Total Protein: 5.9 g/dL — ABNORMAL LOW (ref 6.5–8.1)

## 2023-04-24 LAB — URINE CULTURE: Culture: NO GROWTH

## 2023-04-24 LAB — MAGNESIUM: Magnesium: 2.5 mg/dL — ABNORMAL HIGH (ref 1.7–2.4)

## 2023-04-24 LAB — CBC
HCT: 33 % — ABNORMAL LOW (ref 36.0–46.0)
Hemoglobin: 11.3 g/dL — ABNORMAL LOW (ref 12.0–15.0)
MCH: 28.5 pg (ref 26.0–34.0)
MCHC: 34.2 g/dL (ref 30.0–36.0)
MCV: 83.1 fL (ref 80.0–100.0)
Platelets: 181 10*3/uL (ref 150–400)
RBC: 3.97 MIL/uL (ref 3.87–5.11)
RDW: 14.6 % (ref 11.5–15.5)
WBC: 11.1 10*3/uL — ABNORMAL HIGH (ref 4.0–10.5)
nRBC: 0.3 % — ABNORMAL HIGH (ref 0.0–0.2)

## 2023-04-24 LAB — GLUCOSE, CAPILLARY
Glucose-Capillary: 107 mg/dL — ABNORMAL HIGH (ref 70–99)
Glucose-Capillary: 179 mg/dL — ABNORMAL HIGH (ref 70–99)
Glucose-Capillary: 238 mg/dL — ABNORMAL HIGH (ref 70–99)
Glucose-Capillary: 310 mg/dL — ABNORMAL HIGH (ref 70–99)
Glucose-Capillary: 322 mg/dL — ABNORMAL HIGH (ref 70–99)
Glucose-Capillary: 84 mg/dL (ref 70–99)

## 2023-04-24 LAB — HEMOGLOBIN A1C
Hgb A1c MFr Bld: 9.9 % — ABNORMAL HIGH (ref 4.8–5.6)
Mean Plasma Glucose: 237.43 mg/dL

## 2023-04-24 LAB — BLOOD GAS, VENOUS
Acid-Base Excess: 11.1 mmol/L — ABNORMAL HIGH (ref 0.0–2.0)
Bicarbonate: 37.4 mmol/L — ABNORMAL HIGH (ref 20.0–28.0)
O2 Saturation: 37.7 %
Patient temperature: 37
pCO2, Ven: 55 mmHg (ref 44–60)
pH, Ven: 7.44 — ABNORMAL HIGH (ref 7.25–7.43)
pO2, Ven: <31 mmHg — CL (ref 32–45)

## 2023-04-24 LAB — RPR: RPR Ser Ql: NONREACTIVE

## 2023-04-24 LAB — CORTISOL: Cortisol, Plasma: >100 ug/dL

## 2023-04-24 LAB — PHOSPHORUS: Phosphorus: 3.6 mg/dL (ref 2.5–4.6)

## 2023-04-24 LAB — T3: T3, Total: 38 ng/dL — ABNORMAL LOW (ref 71–180)

## 2023-04-24 MED ORDER — GLUCERNA 1.2 CAL PO LIQD
1000.0000 mL | ORAL | Status: DC
  Filled 2023-04-24: qty 1000

## 2023-04-24 MED ORDER — CHLORHEXIDINE GLUCONATE CLOTH 2 % EX PADS
6.0000 | MEDICATED_PAD | Freq: Every day | CUTANEOUS | Status: DC
  Administered 2023-04-24 – 2023-04-29 (×6): 6 via TOPICAL

## 2023-04-24 MED ORDER — HYDROCORTISONE SOD SUC (PF) 100 MG IJ SOLR
50.0000 mg | Freq: Every day | INTRAMUSCULAR | Status: DC
  Administered 2023-04-24: 50 mg via INTRAVENOUS
  Filled 2023-04-24: qty 2

## 2023-04-24 MED ORDER — INSULIN ASPART 100 UNIT/ML IJ SOLN
0.0000 [IU] | INTRAMUSCULAR | Status: DC
  Administered 2023-04-24: 3 [IU] via SUBCUTANEOUS
  Administered 2023-04-24: 5 [IU] via SUBCUTANEOUS
  Administered 2023-04-25: 3 [IU] via SUBCUTANEOUS
  Administered 2023-04-25: 2 [IU] via SUBCUTANEOUS
  Administered 2023-04-25: 3 [IU] via SUBCUTANEOUS

## 2023-04-24 MED ORDER — FREE WATER: 200.0000 mL | Freq: Four times a day (QID) | Status: DC

## 2023-04-24 MED ORDER — HYDROCORTISONE SOD SUC (PF) 100 MG IJ SOLR
24.0000 mg | Freq: Every day | INTRAMUSCULAR | Status: AC
  Administered 2023-04-25: 24 mg via INTRAVENOUS
  Filled 2023-04-24: qty 2

## 2023-04-24 MED ORDER — LACTATED RINGERS IV SOLN: INTRAVENOUS | Status: AC

## 2023-04-24 MED ORDER — LEVOTHYROXINE SODIUM 100 MCG/5ML IV SOLN
200.0000 ug | Freq: Two times a day (BID) | INTRAVENOUS | Status: DC
  Administered 2023-04-24 – 2023-04-26 (×4): 200 ug via INTRAVENOUS
  Filled 2023-04-24 (×5): qty 10

## 2023-04-24 NOTE — Evaluation (Signed)
Occupational Therapy Evaluation Patient Details Name: Katherine Navarro MRN: [id number] DOB: 05/14/1956 Today's Date: 04/24/2023   History of Present Illness Pt is a 67 y/o F presenting to ED on 11/1 with progressive sleepiness, CT head negative, Admitted for metabolic encephalopathy in setting of hypothyroidism with elevated TSH. PMH includes DM1, hypothyroidism, HTN, diabetic retinopathy   Clinical Impression   Pt reports having assist from spouse at baseline for ADLs, ambulates short distance to bathroom without AD. Pt currently very lethargic, keeping eyes closed frequently during session, needing min-max A for ADL, max A for bed mobility and able to sit EOB x10 min for ADL task. Pt able to open eyes briefly, unable to identify number of fingers therapist was holding up in central and/or peripheral visual fields. Pt presenting with impairments listed below, will follow acutely. Patient will benefit from continued inpatient follow up therapy, <3 hours/day to maximize safety/ind with ADLs/functional mobility.        If plan is discharge home, recommend the following: Two people to help with walking and/or transfers;A lot of help with bathing/dressing/bathroom;Assistance with cooking/housework;Direct supervision/assist for medications management;Direct supervision/assist for financial management;Assist for transportation;Help with stairs or ramp for entrance;Supervision due to cognitive status    Functional Status Assessment  Patient has had a recent decline in their functional status and demonstrates the ability to make significant improvements in function in a reasonable and predictable amount of time.  Equipment Recommendations  Other (comment) (defer)    Recommendations for Other Services PT consult     Precautions / Restrictions Precautions Precautions: Fall Restrictions Weight Bearing Restrictions: No      Mobility Bed Mobility Overal bed mobility: Needs Assistance Bed  Mobility: Supine to Sit, Sit to Supine, Rolling     Supine to sit: Max assist Sit to supine: Max assist        Transfers                   General transfer comment: deferred 2/2 pt lethargy at EOB      Balance Overall balance assessment: Needs assistance Sitting-balance support: No upper extremity supported, Feet supported Sitting balance-Leahy Scale: Fair Sitting balance - Comments: sits EOB with min A                                   ADL either performed or assessed with clinical judgement   ADL Overall ADL's : Needs assistance/impaired Eating/Feeding: Minimal assistance   Grooming: Minimal assistance;Wash/dry face   Upper Body Bathing: Moderate assistance   Lower Body Bathing: Maximal assistance   Upper Body Dressing : Maximal assistance   Lower Body Dressing: Maximal assistance   Toilet Transfer: Maximal assistance   Toileting- Clothing Manipulation and Hygiene: Maximal assistance       Functional mobility during ADLs: Maximal assistance       Vision Baseline Vision/History: 2 Legally blind Additional Comments: L eye blind per chart review, pt opening eyes but keeping them closed throughout most of session, unable to identify number held up in central visual field     Perception Perception: Not tested       Praxis Praxis: Not tested       Pertinent Vitals/Pain Pain Assessment Pain Assessment: No/denies pain     Extremity/Trunk Assessment Upper Extremity Assessment Upper Extremity Assessment: Generalized weakness;RUE deficits/detail RUE Deficits / Details: weaker compared to LUE, pt reports having some decr sensation in R hand  Lower Extremity Assessment Lower Extremity Assessment: Defer to PT evaluation   Cervical / Trunk Assessment Cervical / Trunk Assessment: Kyphotic   Communication Communication Communication: Difficulty following commands/understanding Following commands: Follows one step commands  consistently   Cognition Arousal: Lethargic Behavior During Therapy: Flat affect Overall Cognitive Status: No family/caregiver present to determine baseline cognitive functioning                                       General Comments  VSS    Exercises     Shoulder Instructions      Home Living Family/patient expects to be discharged to:: Private residence Living Arrangements: Spouse/significant other Available Help at Discharge: Family;Available 24 hours/day Type of Home: House Home Access: Stairs to enter [employer] of Steps: 5 Entrance Stairs-Rails: Left Home Layout: Multi-level;Able to live on main level with bedroom/bathroom   Alternate Level Stairs-Rails: Right Bathroom Shower/Tub: Tub/shower unit   Bathroom Toilet: Standard     Home Equipment: Agricultural consultant (2 wheels)          Prior Functioning/Environment Prior Level of Function : Needs assist             Mobility Comments: per husband, patient typically stays in bed and ambulates to bathroom as needed.  Typically ambulates without device and does have RW when needed;  cannot ambulate much further than bathroom. ADLs Comments: assist from spouse for ADLs and IADLs        OT Problem List: Decreased strength;Decreased range of motion;Decreased activity tolerance;Impaired balance (sitting and/or standing);Impaired vision/perception;Decreased cognition;Decreased safety awareness;Impaired UE functional use      OT Treatment/Interventions: Self-care/ADL training;Therapeutic exercise;Energy conservation;DME and/or AE instruction;Therapeutic activities;Balance training;Patient/family education    OT Goals(Current goals can be found in the care plan section) Acute Rehab OT Goals Patient Stated Goal: none stated OT Goal Formulation: With patient Time For Goal Achievement: 05/08/23 Potential to Achieve Goals: Good ADL Goals Pt Will Perform Grooming: with supervision;sitting Pt  Will Transfer to Toilet: with min assist;squat pivot transfer;stand pivot transfer;bedside commode Additional ADL Goal #1: pt will perform bed mobility min A in prep for ADLs Additional ADL Goal #2: Pt will visually locate 3 items needed for ADL or functional task in order to promote ind with ADLs  OT Frequency: Min 1X/week    Co-evaluation              AM-PAC OT "6 Clicks" Daily Activity     Outcome Measure Help from another person eating meals?: A Little Help from another person taking care of personal grooming?: A Little Help from another person toileting, which includes using toliet, bedpan, or urinal?: A Lot Help from another person bathing (including washing, rinsing, drying)?: A Lot Help from another person to put on and taking off regular upper body clothing?: A Lot Help from another person to put on and taking off regular lower body clothing?: A Lot 6 Click Score: 14   End of Session Nurse Communication: Mobility status  Activity Tolerance: Patient tolerated treatment well Patient left: in bed;with call bell/phone within reach;with bed alarm set  OT Visit Diagnosis: Unsteadiness on feet (R26.81);Other abnormalities of gait and mobility (R26.89);Muscle weakness (generalized) (M62.81)                Time: 7564-3329 OT Time Calculation (min): 27 min Charges:  OT General Charges $OT Visit: 1 Visit OT Evaluation $OT Eval Moderate Complexity: 1  Mod OT Treatments $Self Care/Home Management : 8-22 mins  Carver Fila, OTD, OTR/L SecureChat Preferred Acute Rehab [phone]   Dalphine Handing 04/24/2023, 12:31 PM

## 2023-04-24 NOTE — Evaluation (Signed)
Clinical/Bedside Swallow Evaluation Patient Details  Name: Katherine Navarro MRN: [id number] Date of Birth: 14-Dec-1955  Today's Date: 04/24/2023 Time: SLP Start Time (ACUTE ONLY): 1340 SLP Stop Time (ACUTE ONLY): 1400 SLP Time Calculation (min) (ACUTE ONLY): 20 min  Past Medical History:  Past Medical History:  Diagnosis Date   Complication of anesthesia    trouble waking up   CONSTIPATION, CHRONIC 12/13/2007   DIABETES MELLITUS, TYPE I 01/07/2007   DM nephropathy/sclerosis    GLAUCOMA 12/13/2007   HYPERCHOLESTEROLEMIA 12/13/2007   Hypertension    Hyperthyroidism    INTERNAL HEMORRHOIDS 07/23/2008   LEUKOPENIA, CHRONIC 12/13/2007   Nonproliferative diabetic retinopathy NOS(362.03) 12/13/2007   Unspecified hypothyroidism 12/13/2007   VARICOSE VEINS, LOWER EXTREMITIES 12/13/2007   Past Surgical History:  Past Surgical History:  Procedure Laterality Date   ANTERIOR CERVICAL DECOMP/DISCECTOMY FUSION N/A 02/06/2020   Procedure: Anterior Cervical Decompression Discectomy Fusion Cervical five-six;  Surgeon: Donalee Citrin, MD;  Location: [facility];  Service: Neurosurgery;  Laterality: N/A;   ELECTROCARDIOGRAM  08/24/2006   EYE SURGERY     bilateral   LEEP N/A 12/01/2012   Procedure: LOOP ELECTROSURGICAL EXCISION PROCEDURE (LEEP);  Surgeon: Purcell Nails, MD;  Location: [facility] ORS;  Service: Gynecology;  Laterality: N/A;   Stress Myoview   05/18/2004   TUBAL LIGATION     HPI:  Patient is a 67 y.o. female with PMH: hypothyroidism, type 1 DM, glaucoma, HTN who has not been compliant with her thyroid medication or diabetes management/medications. She presented to the hospital on 04/22/2023 from  home because of progressive sleepiness over past weeks. CT head was negative. She was admitted for metabolic encephalopathy in the setting of hypothyroidism with elevated TSH.    Assessment / Plan / Recommendation  Clinical Impression  Patient presents with clinical s/s of what appears to be a reversible, cognitive  based dysphagia secondary to extreme lethargy and fatigue. Patient has no h/o dysphagia as per chart review. Spouse did report that days prior to admission, patient was eating very little and mainly drinking some liquids, broths. Patient had eyes closed but able to become more alert with tactile cues. SLP assessed her swallow function via PO's of solids (graham crackers) and thin liquids (juice). Patient able to hold graham cracker with setup  assistance and feed self but fine motor control appeared impaired. She exhibited delayed initiation of oral preparatory phase of swallow with straw sips of liquids and bites of solids, as well as slow mastication. Trace to mild oral residuals remained s/p initial swallow with solids but cleared with subsequent swallows. No overt s/s aspiration with solids or liquids. SLP recommending downgrade slightly to Dys 3 (mechanical soft) solids, continue with thin liquids and will follow briefly to ensure toleration. SLP Visit Diagnosis: Dysphagia, unspecified (R13.10)    Aspiration Risk  Mild aspiration risk    Diet Recommendation Dysphagia 3 (Mech soft);Thin liquid    Liquid Administration via: Cup;Straw Medication Administration: Whole meds with liquid Supervision: Full supervision/cueing for compensatory strategies;Staff to assist with self feeding Compensations: Slow rate;Small sips/bites;Minimize environmental distractions Postural Changes: Seated upright at 90 degrees    Other  Recommendations Oral Care Recommendations: Oral care BID    Recommendations for follow up therapy are one component of a multi-disciplinary discharge planning process, led by the attending physician.  Recommendations may be updated based on patient status, additional functional criteria and insurance authorization.  Follow up Recommendations No SLP follow up      Assistance Recommended at Discharge  Functional Status Assessment Patient has had a recent decline in their functional  status and demonstrates the ability to make significant improvements in function in a reasonable and predictable amount of time.  Frequency and Duration min 2x/week  1 week       Prognosis Prognosis for improved oropharyngeal function: Good      Swallow Study   General Date of Onset: 04/22/23 HPI: Patient is a 67 y.o. female with PMH: hypothyroidism, type 1 DM, glaucoma, HTN who has not been compliant with her thyroid medication or diabetes management/medications. She presented to the hospital on 04/22/2023 from  home because of progressive sleepiness over past weeks. CT head was negative. She was admitted for metabolic encephalopathy in the setting of hypothyroidism with elevated TSH. Type of Study: Bedside Swallow Evaluation Previous Swallow Assessment: none found Diet Prior to this Study: Regular;Thin liquids (Level 0) Temperature Spikes Noted: No Respiratory Status: Room air History of Recent Intubation: No Behavior/Cognition: Cooperative;Confused;Lethargic/Drowsy;Requires cueing Oral Cavity Assessment: Within Functional Limits Oral Care Completed by SLP: No Oral Cavity - Dentition: Adequate natural dentition Vision: Impaired for self-feeding Self-Feeding Abilities: Needs assist;Needs set up Patient Positioning: Upright in bed Baseline Vocal Quality: Normal Volitional Cough: Cognitively unable to elicit Volitional Swallow: Unable to elicit    Oral/Motor/Sensory Function Overall Oral Motor/Sensory Function: Within functional limits   Ice Chips     Thin Liquid Thin Liquid: Impaired Oral Phase Impairments: Poor awareness of bolus;Reduced labial seal Oral Phase Functional Implications: Prolonged oral transit Pharyngeal  Phase Impairments: Other (comments) (no overt s/s)    Nectar Thick     Honey Thick     Puree Puree: Not tested Other Comments: patient declined puree   Solid     Solid: Impaired Oral Phase Impairments: Impaired mastication Oral Phase Functional  Implications: Impaired mastication;Prolonged oral transit Pharyngeal Phase Impairments: Other (comments) (no overt s/s)      Angela Nevin, MA, CCC-SLP Speech Therapy

## 2023-04-24 NOTE — Progress Notes (Signed)
SLP Cancellation Note  Patient Details Name: JERALYN NOLDEN MRN: [id number] DOB: 06/02/1956   Cancelled treatment:       Reason Eval/Treat Not Completed: Patient's level of consciousness. Patient asleep and would not rouse despite attempts. SLP will continue efforts today.   Angela Nevin, MA, CCC-SLP Speech Therapy

## 2023-04-24 NOTE — Progress Notes (Signed)
PROGRESS NOTE                                                                                                                                                                                                             Patient Demographics:    Katherine Navarro, is a 67 y.o. female, DOB - 29-Jul-1955, ZOX:[id number]  Outpatient Primary MD for the patient is Jarold Motto, [place]    LOS - 2  Admit date - 04/22/2023    Chief Complaint  Patient presents with   Weakness       Brief Narrative (HPI from H&P)    DM-1 supposed to be on insulin pump, hypothyroidism and HTN brought to ED with progressive sleepiness over weeks, and admitted for metabolic encephalopathy in the setting of severe hypothyroidism with elevated TSH to 44 and undetectable free T4.  She apparently was noncompliant with her Synthroid medication, head CT, EEG, ABG, ammonia level stable.  She appears to be in myxedema coma, transferred to my care on 04/24/2023.   Subjective:    Katherine Navarro today in bed extremely sleepy and somnolent, denies any headache or chest pain.   Assessment  & Plan :   Myxedema coma. Due to noncompliance with Synthroid, TSH more than 40, free T4 initially undetectable, stable head CT, EEG, ammonia and ABG, no focal deficits, placed on high-dose IV Synthroid, if no improvement will add T3, drop NG tube for nutrition with tube feeds, IV fluids.  Monitor closely currently maintaining airway.  Random cortisol above 100 taper off steroids.  AKI on CKD 3A.  Hydrate and monitor.    DM type I.  Now every 4 hours sliding scale and monitor.  Check A1c.   CBG (last 3)  Recent Labs    04/23/23 2345 04/24/23 0447 04/24/23 0805  GLUCAP 208* 322* 310*   Lab Results  Component Value Date   HGBA1C 10.0 (A) 03/19/2021         Condition - Extremely Guarded  Family Communication  :  husband [phone]  on 04/24/23  Code Status :   Full  Consults  :  None  PUD Prophylaxis :    Procedures  :     CT head and EEG - non acute      Disposition Plan  :    Status is: Inpatient   DVT  Prophylaxis  :    enoxaparin (LOVENOX) injection 40 mg Start: 04/23/23 1000  Lab Results  Component Value Date   PLT 181 04/24/2023    Diet :  Diet Order             Diet Carb Modified Fluid consistency: Thin; Room service appropriate? Yes  Diet effective now                    Inpatient Medications  Scheduled Meds:  Chlorhexidine Gluconate Cloth  6 each Topical Daily   docusate sodium  100 mg Oral BID   enoxaparin (LOVENOX) injection  40 mg Subcutaneous Daily   free water  200 mL Per Tube Q6H   [START ON 04/25/2023] hydrocortisone sod succinate (SOLU-CORTEF) inj  24 mg Intravenous Daily   insulin aspart  0-6 Units Subcutaneous Q4H   levothyroxine  200 mcg Intravenous BID   [START ON 04/26/2023] thiamine (VITAMIN B1) injection  100 mg Intravenous Daily   Continuous Infusions:  feeding supplement (GLUCERNA 1.2 CAL)     lactated ringers     thiamine (VITAMIN B1) injection 500 mg (04/24/23 0535)   Followed by   thiamine (VITAMIN B1) injection     PRN Meds:.acetaminophen **OR** acetaminophen, dextrose, ondansetron **OR** ondansetron (ZOFRAN) IV, sorbitol  Antibiotics  :    Anti-infectives (From admission, onward)    None         Objective:   Vitals:   04/23/23 2343 04/24/23 0401 04/24/23 0806 04/24/23 0940  BP: 134/78 131/70 104/63   Pulse: 70 69 64   Resp: 18 17 10 18   Temp: 97.8 F (36.6 C) 97.8 F (36.6 C) 98 F (36.7 C)   TempSrc: Oral Axillary Oral   SpO2: 98% 99% 99%   Weight:      Height:        Wt Readings from Last 3 Encounters:  04/23/23 72.9 kg  03/19/21 72.9 kg  12/18/20 71.7 kg     Intake/Output Summary (Last 24 hours) at 04/24/2023 1040 Last data filed at 04/24/2023 0500 Gross per 24 hour  Intake --  Output 1500 ml  Net -1500 ml     Physical Exam  Sleepy and  somnolent, no new F.N deficits,   [place].AT,PERRAL Supple Neck, No JVD,   Symmetrical Chest wall movement, Good air movement bilaterally, CTAB RRR,No Gallops,Rubs or new Murmurs,  +ve B.Sounds, Abd Soft, No tenderness,   No Cyanosis, Clubbing or edema       Data Review:    Recent Labs  Lab 04/22/23 1626 04/22/23 1736 04/23/23 1712 04/24/23 0238  WBC 6.7  --  6.5 11.1*  HGB 11.8* 12.6 12.8 11.3*  HCT 35.7* 37.0 36.9 33.0*  PLT 166  --  181 181  MCV 85.6  --  84.6 83.1  MCH 28.3  --  29.4 28.5  MCHC 33.1  --  34.7 34.2  RDW 14.8  --  14.6 14.6  LYMPHSABS 1.1  --  0.7  --   MONOABS 0.7  --  0.4  --   EOSABS 0.1  --  0.0  --   BASOSABS 0.0  --  0.0  --     Recent Labs  Lab 04/22/23 1626 04/22/23 1637 04/22/23 1736 04/22/23 2039 04/23/23 0903 04/23/23 1712 04/23/23 1718 04/24/23 0238  NA 137  --  134*  --   --  136  --  137  K 3.7  --  3.5  --   --  3.4*  --  3.7  CL 93*  --   --   --   --  95*  --  98  CO2 24  --   --   --   --  27  --  26  ANIONGAP 20*  --   --   --   --  14  --  13  GLUCOSE 128*  --   --   --   --  81  --  368*  BUN 32*  --   --   --   --  30*  --  25*  CREATININE 1.32*  --   --   --   --  1.24*  --  1.17*  AST 35  --   --   --   --  31  --  24  ALT 24  --   --   --   --  23  --  20  ALKPHOS 60  --   --   --   --  62  --  57  BILITOT 1.4*  --   --   --   --  1.1  --  0.9  ALBUMIN 3.7  --   --   --   --  3.6  --  3.0*  LATICACIDVEN  --  1.7  --  2.1*  --   --   --   --   TSH 44.087*  --   --   --   --   --   --   --   AMMONIA  --   --   --   --   --   --  23  --   MG 2.1  --   --   --  0.8*  --  3.7* 2.5*  CALCIUM 12.0*  --   --   --   --  11.5*  --  10.1      Recent Labs  Lab 04/22/23 1626 04/22/23 1637 04/22/23 2039 04/23/23 0903 04/23/23 1712 04/23/23 1718 04/24/23 0238  LATICACIDVEN  --  1.7 2.1*  --   --   --   --   TSH 44.087*  --   --   --   --   --   --   AMMONIA  --   --   --   --   --  23  --   MG 2.1  --   --  0.8*  --   3.7* 2.5*  CALCIUM 12.0*  --   --   --  11.5*  --  10.1    --------------------------------------------------------------------------------------------------------------- Lab Results  Component Value Date   CHOL 240 (H) 09/02/2017   HDL 69.90 09/02/2017   LDLCALC 149 (H) 09/02/2017   LDLDIRECT 210.4 05/14/2013   TRIG 106.0 09/02/2017   CHOLHDL 3 09/02/2017    Lab Results  Component Value Date   HGBA1C 10.0 (A) 03/19/2021   Recent Labs    04/22/23 1626 04/22/23 2039  TSH 44.087*  --   FREET4  --  <0.25*   Recent Labs    04/23/23 1718  VITAMINB12 1,992*   ------------------------------------------------------------------------------------------------------------------ Cardiac Enzymes No results for input(s): "CKMB", "TROPONINI", "MYOGLOBIN" in the last 168 hours.  Invalid input(s): "CK"  Micro Results Recent Results (from the past 240 hour(s))  Culture, blood (single) w Reflex to ID Panel     Status: None (Preliminary result)   Collection Time: 04/23/23  7:38 AM   Specimen: BLOOD RIGHT ARM  Result Value Ref Range Status   Specimen Description BLOOD RIGHT ARM  Final   Special Requests   Final    BOTTLES DRAWN AEROBIC AND ANAEROBIC Blood Culture adequate volume   Culture   Final    NO GROWTH 1 DAY Performed at [facility] Lab, [address], [place], [place] [address]    Report Status PENDING  Incomplete    Radiology Reports EEG adult  Result Date: 04/23/2023 Charlsie Quest, MD     04/23/2023 12:34 PM Patient Name: LIRIDONA MASHAW MRN: [id number] Epilepsy Attending: Charlsie Quest Referring Physician/Provider: Almon Hercules, MD Date:04/23/2023 Duration: 24.50 mins Patient history: 67yo F with ams getting eeg to evaluate for seizure Level of alertness: Awake AEDs during EEG study: None Technical aspects: This EEG study was done with scalp electrodes positioned according to the 10-20 International system of electrode placement. Electrical activity was reviewed  with band pass filter of 1-70Hz , sensitivity of 7 uV/mm, display speed of 28mm/sec with a 60Hz  notched filter applied as appropriate. EEG data were recorded continuously and digitally stored.  Video monitoring was available and reviewed as appropriate. Description: The posterior dominant rhythm consists of 8 Hz activity of moderate voltage (25-35 uV) seen predominantly in posterior head regions, symmetric and reactive to eye opening and eye closing. EEG showed intermittent generalized 3 to 6 Hz theta-delta slowing. Hyperventilation and photic stimulation were not performed.   ABNORMALITY - Intermittent slow, generalized IMPRESSION: This study is suggestive of mild diffuse encephalopathy. No seizures or epileptiform discharges were seen throughout the recording. Charlsie Quest   DG Chest Portable 1 View  Result Date: 04/22/2023 CLINICAL DATA:  Altered mental status EXAM: PORTABLE CHEST 1 VIEW COMPARISON:  None Available. FINDINGS: The heart size and mediastinal contours are within normal limits. Both lungs are clear. The visualized skeletal structures are unremarkable. IMPRESSION: No active disease. Electronically Signed   By: Charlett Nose M.D.   On: 04/22/2023 19:08   CT Head Wo Contrast  Result Date: 04/22/2023 CLINICAL DATA:  Weakness, altered mental status EXAM: CT HEAD WITHOUT CONTRAST TECHNIQUE: Contiguous axial images were obtained from the base of the skull through the vertex without intravenous contrast. RADIATION DOSE REDUCTION: This exam was performed according to the departmental dose-optimization program which includes automated exposure control, adjustment of the mA and/or kV according to patient size and/or use of iterative reconstruction technique. COMPARISON:  Brain MRI 11/21/2019 FINDINGS: Brain: There is no acute intracranial hemorrhage, extra-axial fluid collection, or acute infarct. There is mild parenchymal volume loss with prominence of the ventricular system and extra-axial CSF  spaces. Hypodensity in the supratentorial white matter likely reflects underlying chronic small-vessel ischemic change. The pituitary and suprasellar region are normal. There is no mass lesion. There is no mass effect or midline shift. Vascular: There is calcification of the bilateral carotid siphons. Skull: Normal. Negative for fracture or focal lesion. Sinuses/Orbits: The paranasal sinuses are clear. A right glaucoma valve and bilateral lens implants are noted. There is peripheral calcification and atrophy of the left globe which may reflect developing phthisis bulbi. Other: The mastoid air cells and middle ear cavities are clear. IMPRESSION: No acute intracranial pathology. Electronically Signed   By: Lesia Hausen M.D.   On: 04/22/2023 18:49      Signature  -   Susa Raring M.D on 04/24/2023 at 10:40 AM   -  To page go to [email or web address]

## 2023-04-24 NOTE — Plan of Care (Signed)
  Problem: Coping: Goal: Ability to adjust to condition or change in health will improve Outcome: Progressing   Problem: Health Behavior/Discharge Planning: Goal: Ability to identify and utilize available resources and services will improve Outcome: Progressing   Problem: Metabolic: Goal: Ability to maintain appropriate glucose levels will improve Outcome: Progressing   Problem: Nutritional: Goal: Maintenance of adequate nutrition will improve Outcome: Progressing   Problem: Skin Integrity: Goal: Risk for impaired skin integrity will decrease Outcome: Progressing

## 2023-04-25 DIAGNOSIS — E039 Hypothyroidism, unspecified: Secondary | ICD-10-CM | POA: Diagnosis not present

## 2023-04-25 LAB — BASIC METABOLIC PANEL
Anion gap: 10 (ref 5–15)
BUN: 17 mg/dL (ref 8–23)
CO2: 25 mmol/L (ref 22–32)
Calcium: 9.6 mg/dL (ref 8.9–10.3)
Chloride: 103 mmol/L (ref 98–111)
Creatinine, Ser: 0.97 mg/dL (ref 0.44–1.00)
GFR, Estimated: >60 mL/min (ref >60)
Glucose, Bld: 130 mg/dL — ABNORMAL HIGH (ref 70–99)
Potassium: 3.5 mmol/L (ref 3.5–5.1)
Sodium: 138 mmol/L (ref 135–145)

## 2023-04-25 LAB — CBC WITH DIFFERENTIAL/PLATELET
Abs Immature Granulocytes: 0.14 10*3/uL — ABNORMAL HIGH (ref 0.00–0.07)
Basophils Absolute: 0 10*3/uL (ref 0.0–0.1)
Basophils Relative: 0 %
Eosinophils Absolute: 0 10*3/uL (ref 0.0–0.5)
Eosinophils Relative: 0 %
HCT: 32.7 % — ABNORMAL LOW (ref 36.0–46.0)
Hemoglobin: 11.2 g/dL — ABNORMAL LOW (ref 12.0–15.0)
Immature Granulocytes: 1 %
Lymphocytes Relative: 7 %
Lymphs Abs: 0.8 10*3/uL (ref 0.7–4.0)
MCH: 29.2 pg (ref 26.0–34.0)
MCHC: 34.3 g/dL (ref 30.0–36.0)
MCV: 85.2 fL (ref 80.0–100.0)
Monocytes Absolute: 0.8 10*3/uL (ref 0.1–1.0)
Monocytes Relative: 6 %
Neutro Abs: 10.6 10*3/uL — ABNORMAL HIGH (ref 1.7–7.7)
Neutrophils Relative %: 86 %
Platelets: 169 10*3/uL (ref 150–400)
RBC: 3.84 MIL/uL — ABNORMAL LOW (ref 3.87–5.11)
RDW: 14.8 % (ref 11.5–15.5)
WBC: 12.4 10*3/uL — ABNORMAL HIGH (ref 4.0–10.5)
nRBC: 0.3 % — ABNORMAL HIGH (ref 0.0–0.2)

## 2023-04-25 LAB — GLUCOSE, CAPILLARY
Glucose-Capillary: 119 mg/dL — ABNORMAL HIGH (ref 70–99)
Glucose-Capillary: 137 mg/dL — ABNORMAL HIGH (ref 70–99)
Glucose-Capillary: 142 mg/dL — ABNORMAL HIGH (ref 70–99)
Glucose-Capillary: 175 mg/dL — ABNORMAL HIGH (ref 70–99)
Glucose-Capillary: 178 mg/dL — ABNORMAL HIGH (ref 70–99)
Glucose-Capillary: 64 mg/dL — ABNORMAL LOW (ref 70–99)
Glucose-Capillary: 87 mg/dL (ref 70–99)

## 2023-04-25 LAB — BRAIN NATRIURETIC PEPTIDE: B Natriuretic Peptide: 460.8 pg/mL — ABNORMAL HIGH (ref 0.0–100.0)

## 2023-04-25 LAB — PHOSPHORUS: Phosphorus: 1.6 mg/dL — ABNORMAL LOW (ref 2.5–4.6)

## 2023-04-25 LAB — HEMOGLOBIN A1C
Hgb A1c MFr Bld: 10.5 % — ABNORMAL HIGH (ref 4.8–5.6)
Mean Plasma Glucose: 255 mg/dL

## 2023-04-25 LAB — MAGNESIUM: Magnesium: 2.1 mg/dL (ref 1.7–2.4)

## 2023-04-25 MED ORDER — POTASSIUM CHLORIDE 20 MEQ PO PACK
40.0000 meq | PACK | Freq: Once | ORAL | Status: AC
  Administered 2023-04-25: 40 meq via ORAL
  Filled 2023-04-25: qty 2

## 2023-04-25 MED ORDER — POTASSIUM PHOSPHATES 15 MMOLE/5ML IV SOLN
30.0000 mmol | Freq: Once | INTRAVENOUS | Status: AC
  Administered 2023-04-25: 30 mmol via INTRAVENOUS
  Filled 2023-04-25: qty 10

## 2023-04-25 MED ORDER — DEXTROSE 50 % IV SOLN
INTRAVENOUS | Status: AC
  Administered 2023-04-25: 25 mL via INTRAVENOUS
  Filled 2023-04-25: qty 50

## 2023-04-25 NOTE — Plan of Care (Signed)
Problem: Education: Goal: Ability to describe self-care measures that may prevent or decrease complications (Diabetes Survival Skills Education) will improve 04/25/2023 2321 by Arnetha Courser, RN Outcome: Progressing 04/25/2023 2321 by Arnetha Courser, RN Outcome: Progressing Goal: Individualized Educational Video(s) 04/25/2023 2321 by Arnetha Courser, RN Outcome: Progressing 04/25/2023 2321 by Arnetha Courser, RN Outcome: Progressing   Problem: Coping: Goal: Ability to adjust to condition or change in health will improve Outcome: Progressing   Problem: Fluid Volume: Goal: Ability to maintain a balanced intake and output will improve 04/25/2023 2321 by Arnetha Courser, RN Outcome: Progressing 04/25/2023 2321 by Arnetha Courser, RN Outcome: Progressing   Problem: Health Behavior/Discharge Planning: Goal: Ability to identify and utilize available resources and services will improve 04/25/2023 2321 by Arnetha Courser, RN Outcome: Progressing 04/25/2023 2321 by Arnetha Courser, RN Outcome: Progressing Goal: Ability to manage health-related needs will improve 04/25/2023 2321 by Arnetha Courser, RN Outcome: Progressing 04/25/2023 2321 by Arnetha Courser, RN Outcome: Progressing   Problem: Metabolic: Goal: Ability to maintain appropriate glucose levels will improve 04/25/2023 2321 by Arnetha Courser, RN Outcome: Progressing 04/25/2023 2321 by Arnetha Courser, RN Outcome: Progressing   Problem: Nutritional: Goal: Maintenance of adequate nutrition will improve 04/25/2023 2321 by Arnetha Courser, RN Outcome: Progressing 04/25/2023 2321 by Arnetha Courser, RN Outcome: Progressing Goal: Progress toward achieving an optimal weight will improve 04/25/2023 2321 by Arnetha Courser, RN Outcome: Progressing 04/25/2023 2321 by Arnetha Courser, RN Outcome: Progressing   Problem: Skin Integrity: Goal: Risk for  impaired skin integrity will decrease Outcome: Progressing   Problem: Tissue Perfusion: Goal: Adequacy of tissue perfusion will improve Outcome: Progressing   Problem: Education: Goal: Ability to describe self-care measures that may prevent or decrease complications (Diabetes Survival Skills Education) will improve Outcome: Progressing Goal: Individualized Educational Video(s) Outcome: Progressing   Problem: Cardiac: Goal: Ability to maintain an adequate cardiac output will improve Outcome: Progressing   Problem: Health Behavior/Discharge Planning: Goal: Ability to identify and utilize available resources and services will improve Outcome: Progressing Goal: Ability to manage health-related needs will improve Outcome: Progressing   Problem: Fluid Volume: Goal: Ability to achieve a balanced intake and output will improve Outcome: Progressing   Problem: Metabolic: Goal: Ability to maintain appropriate glucose levels will improve Outcome: Progressing   Problem: Nutritional: Goal: Maintenance of adequate nutrition will improve Outcome: Progressing Goal: Maintenance of adequate weight for body size and type will improve Outcome: Progressing   Problem: Respiratory: Goal: Will regain and/or maintain adequate ventilation Outcome: Progressing   Problem: Urinary Elimination: Goal: Ability to achieve and maintain adequate renal perfusion and functioning will improve Outcome: Progressing   Problem: Education: Goal: Knowledge of General Education information will improve Description: Including pain rating scale, medication(s)/side effects and non-pharmacologic comfort measures Outcome: Progressing   Problem: Health Behavior/Discharge Planning: Goal: Ability to manage health-related needs will improve Outcome: Progressing   Problem: Clinical Measurements: Goal: Ability to maintain clinical measurements within normal limits will improve Outcome: Progressing Goal: Will remain  free from infection Outcome: Progressing Goal: Diagnostic test results will improve Outcome: Progressing Goal: Respiratory complications will improve Outcome: Progressing Goal: Cardiovascular complication will be avoided Outcome: Progressing   Problem: Activity: Goal: Risk for activity intolerance will decrease Outcome: Progressing   Problem: Nutrition: Goal: Adequate nutrition will be maintained Outcome: Progressing   Problem: Coping: Goal: Level of anxiety will decrease Outcome: Progressing   Problem: Elimination: Goal: Will not experience complications related to bowel motility Outcome: Progressing Goal: Will not experience complications related to urinary retention Outcome: Progressing  Problem: Pain Management: Goal: General experience of comfort will improve Outcome: Progressing   Problem: Safety: Goal: Ability to remain free from injury will improve Outcome: Progressing   Problem: Skin Integrity: Goal: Risk for impaired skin integrity will decrease Outcome: Progressing

## 2023-04-25 NOTE — Care Management Important Message (Signed)
Important Message  Patient Details  Name: Katherine Navarro MRN: [id number] Date of Birth: 04-15-1956   Important Message Given:  Yes - Medicare IM     Dorena Bodo 04/25/2023, 3:28 PM

## 2023-04-25 NOTE — Progress Notes (Signed)
PROGRESS NOTE                                                                                                                                                                                                             Patient Demographics:    Katherine Navarro, is a 67 y.o. female, DOB - 08-Apr-1956, [id number]  Outpatient Primary MD for the patient is Jarold Motto, [place]    LOS - 3  Admit date - 04/22/2023    Chief Complaint  Patient presents with   Weakness       Brief Narrative (HPI from H&P)    DM-1 supposed to be on insulin pump, hypothyroidism and HTN brought to ED with progressive sleepiness over weeks, and admitted for metabolic encephalopathy in the setting of severe hypothyroidism with elevated TSH to 44 and undetectable free T4.  She apparently was noncompliant with her Synthroid medication, head CT, EEG, ABG, ammonia level stable.  She appears to be in myxedema coma, transferred to my care on 04/24/2023.   Subjective:   Patient in bed, appears comfortable, denies any headache, no fever, no chest pain or pressure, no shortness of breath , no abdominal pain. No new focal weakness.   Assessment  & Plan :   Myxedema coma. Due to noncompliance with Synthroid, TSH more than 40, free T4 initially undetectable, stable head CT, EEG, ammonia and ABG, no focal deficits, placed on high-dose IV Synthroid, if no improvement will add T3, now clinically improving on 04/25/2023, continue to monitor.  Random cortisol above 100, taper off steroids.  AKI on CKD 3A.  Hydrate and monitor.    DM type I.  Now every 4 hours sliding scale and monitor.  Check A1c.   CBG (last 3)  Recent Labs    04/25/23 0341 04/25/23 0413 04/25/23 0811  GLUCAP 64* 142* 87   Lab Results  Component Value Date   HGBA1C 9.9 (H) 04/24/2023         Condition - Extremely Guarded  Family Communication  :  husband [phone]  on 04/24/23  Code  Status :  Full  Consults  :  None  PUD Prophylaxis :    Procedures  :     CT head and EEG - non acute      Disposition Plan  :    Status is: Inpatient  DVT Prophylaxis  :    enoxaparin (LOVENOX) injection 40 mg Start: 04/23/23 1000  Lab Results  Component Value Date   PLT 169 04/25/2023    Diet :  Diet Order             DIET DYS 3 Room service appropriate? Yes with Assist; Fluid consistency: Thin  Diet effective now                    Inpatient Medications  Scheduled Meds:  Chlorhexidine Gluconate Cloth  6 each Topical Daily   docusate sodium  100 mg Oral BID   enoxaparin (LOVENOX) injection  40 mg Subcutaneous Daily   hydrocortisone sod succinate (SOLU-CORTEF) inj  24 mg Intravenous Daily   insulin aspart  0-15 Units Subcutaneous Q4H   levothyroxine  200 mcg Intravenous BID   potassium chloride  40 mEq Oral Once   [START ON 04/26/2023] thiamine (VITAMIN B1) injection  100 mg Intravenous Daily   Continuous Infusions:  feeding supplement (GLUCERNA 1.2 CAL)     potassium PHOSPHATE IVPB (in mmol)     thiamine (VITAMIN B1) injection 250 mg (04/25/23 0618)   PRN Meds:.acetaminophen **OR** acetaminophen, dextrose, ondansetron **OR** ondansetron (ZOFRAN) IV, sorbitol  Antibiotics  :    Anti-infectives (From admission, onward)    None         Objective:   Vitals:   04/24/23 1600 04/24/23 2029 04/24/23 2341 04/25/23 0338  BP: 138/73 (!) 144/81 136/72 (!) 146/74  Pulse: 69 70 67 65  Resp: 15 14 10 12   Temp: (!) 97.5 F (36.4 C) (!) 97.4 F (36.3 C) (!) 97.5 F (36.4 C) (!) 97.3 F (36.3 C)  TempSrc: Oral Oral Oral Oral  SpO2:      Weight:      Height:        Wt Readings from Last 3 Encounters:  04/23/23 72.9 kg  03/19/21 72.9 kg  12/18/20 71.7 kg    No intake or output data in the 24 hours ending 04/25/23 0830    Physical Exam  Awake and less sleepy, no new F.N deficits,   [place].AT,PERRAL Supple Neck, No JVD,   Symmetrical Chest  wall movement, Good air movement bilaterally, CTAB RRR,No Gallops,Rubs or new Murmurs,  +ve B.Sounds, Abd Soft, No tenderness,   No Cyanosis, Clubbing or edema       Data Review:    Recent Labs  Lab 04/22/23 1626 04/22/23 1736 04/23/23 1712 04/24/23 0238 04/25/23 0447  WBC 6.7  --  6.5 11.1* 12.4*  HGB 11.8* 12.6 12.8 11.3* 11.2*  HCT 35.7* 37.0 36.9 33.0* 32.7*  PLT 166  --  181 181 169  MCV 85.6  --  84.6 83.1 85.2  MCH 28.3  --  29.4 28.5 29.2  MCHC 33.1  --  34.7 34.2 34.3  RDW 14.8  --  14.6 14.6 14.8  LYMPHSABS 1.1  --  0.7  --  0.8  MONOABS 0.7  --  0.4  --  0.8  EOSABS 0.1  --  0.0  --  0.0  BASOSABS 0.0  --  0.0  --  0.0    Recent Labs  Lab 04/22/23 1626 04/22/23 1637 04/22/23 1736 04/22/23 2039 04/23/23 0903 04/23/23 1712 04/23/23 1718 04/24/23 0238 04/25/23 0447  NA 137  --  134*  --   --  136  --  137 138  K 3.7  --  3.5  --   --  3.4*  --  3.7 3.5  CL 93*  --   --   --   --  95*  --  98 103  CO2 24  --   --   --   --  27  --  26 25  ANIONGAP 20*  --   --   --   --  14  --  13 10  GLUCOSE 128*  --   --   --   --  81  --  368* 130*  BUN 32*  --   --   --   --  30*  --  25* 17  CREATININE 1.32*  --   --   --   --  1.24*  --  1.17* 0.97  AST 35  --   --   --   --  31  --  24  --   ALT 24  --   --   --   --  23  --  20  --   ALKPHOS 60  --   --   --   --  62  --  57  --   BILITOT 1.4*  --   --   --   --  1.1  --  0.9  --   ALBUMIN 3.7  --   --   --   --  3.6  --  3.0*  --   LATICACIDVEN  --  1.7  --  2.1*  --   --   --   --   --   TSH 44.087*  --   --   --   --   --   --   --   --   HGBA1C  --   --   --   --   --   --   --  9.9*  --   AMMONIA  --   --   --   --   --   --  23  --   --   BNP  --   --   --   --   --   --   --   --  460.8*  MG 2.1  --   --   --  0.8*  --  3.7* 2.5* 2.1  CALCIUM 12.0*  --   --   --   --  11.5*  --  10.1 9.6      Recent Labs  Lab 04/22/23 1626 04/22/23 1637 04/22/23 2039 04/23/23 0903 04/23/23 1712  04/23/23 1718 04/24/23 0238 04/25/23 0447  LATICACIDVEN  --  1.7 2.1*  --   --   --   --   --   TSH 44.087*  --   --   --   --   --   --   --   HGBA1C  --   --   --   --   --   --  9.9*  --   AMMONIA  --   --   --   --   --  23  --   --   BNP  --   --   --   --   --   --   --  460.8*  MG 2.1  --   --  0.8*  --  3.7* 2.5* 2.1  CALCIUM 12.0*  --   --   --  11.5*  --  10.1 9.6    --------------------------------------------------------------------------------------------------------------- Lab Results  Component Value Date   CHOL 240 (H) 09/02/2017   HDL 69.90 09/02/2017   LDLCALC 149 (H) 09/02/2017   LDLDIRECT 210.4 05/14/2013   TRIG 106.0 09/02/2017   CHOLHDL 3 09/02/2017    Lab Results  Component Value Date   HGBA1C 9.9 (H) 04/24/2023   Recent Labs    04/22/23 1626 04/22/23 2039  TSH 44.087*  --   FREET4  --  <0.25*   Recent Labs    04/23/23 1718  VITAMINB12 1,992*   ------------------------------------------------------------------------------------------------------------------ Cardiac Enzymes No results for input(s): "CKMB", "TROPONINI", "MYOGLOBIN" in the last 168 hours.  Invalid input(s): "CK"  Micro Results Recent Results (from the past 240 hour(s))  Urine Culture     Status: None   Collection Time: 04/22/23  4:29 PM   Specimen: Urine, Clean Catch  Result Value Ref Range Status   Specimen Description URINE, CLEAN CATCH  Final   Special Requests NONE  Final   Culture   Final    NO GROWTH Performed at [facility], [address] [place], [place] [address]    Report Status 04/24/2023 FINAL  Final  Culture, blood (single) w Reflex to ID Panel     Status: None (Preliminary result)   Collection Time: 04/23/23  7:38 AM   Specimen: BLOOD RIGHT ARM  Result Value Ref Range Status   Specimen Description BLOOD RIGHT ARM  Final   Special Requests   Final    BOTTLES DRAWN AEROBIC AND ANAEROBIC Blood Culture adequate volume   Culture   Final    NO  GROWTH 1 DAY Performed at [facility], [address], [place], [place] [address]    Report Status PENDING  Incomplete    Radiology Reports EEG adult  Result Date: 04/23/2023 Charlsie Quest, MD     04/23/2023 12:34 PM Patient Name: MIYONNA ORMISTON MRN: [id number] Epilepsy Attending: Charlsie Quest Referring Physician/Provider: Almon Hercules, MD Date:04/23/2023 Duration: 24.50 mins Patient history: 67yo F with ams getting eeg to evaluate for seizure Level of alertness: Awake AEDs during EEG study: None Technical aspects: This EEG study was done with scalp electrodes positioned according to the 10-20 International system of electrode placement. Electrical activity was reviewed with band pass filter of 1-70Hz , sensitivity of 7 uV/mm, display speed of 105mm/sec with a 60Hz  notched filter applied as appropriate. EEG data were recorded continuously and digitally stored.  Video monitoring was available and reviewed as appropriate. Description: The posterior dominant rhythm consists of 8 Hz activity of moderate voltage (25-35 uV) seen predominantly in posterior head regions, symmetric and reactive to eye opening and eye closing. EEG showed intermittent generalized 3 to 6 Hz theta-delta slowing. Hyperventilation and photic stimulation were not performed.   ABNORMALITY - Intermittent slow, generalized IMPRESSION: This study is suggestive of mild diffuse encephalopathy. No seizures or epileptiform discharges were seen throughout the recording. Charlsie Quest   DG Chest Portable 1 View  Result Date: 04/22/2023 CLINICAL DATA:  Altered mental status EXAM: PORTABLE CHEST 1 VIEW COMPARISON:  None Available. FINDINGS: The heart size and mediastinal contours are within normal limits. Both lungs are clear. The visualized skeletal structures are unremarkable. IMPRESSION: No active disease. Electronically Signed   By: Charlett Nose M.D.   On: 04/22/2023 19:08   CT Head Wo Contrast  Result Date:  04/22/2023 CLINICAL DATA:  Weakness, altered mental status EXAM: CT HEAD WITHOUT CONTRAST TECHNIQUE: Contiguous axial images were obtained from the base of the skull through the  vertex without intravenous contrast. RADIATION DOSE REDUCTION: This exam was performed according to the departmental dose-optimization program which includes automated exposure control, adjustment of the mA and/or kV according to patient size and/or use of iterative reconstruction technique. COMPARISON:  Brain MRI 11/21/2019 FINDINGS: Brain: There is no acute intracranial hemorrhage, extra-axial fluid collection, or acute infarct. There is mild parenchymal volume loss with prominence of the ventricular system and extra-axial CSF spaces. Hypodensity in the supratentorial white matter likely reflects underlying chronic small-vessel ischemic change. The pituitary and suprasellar region are normal. There is no mass lesion. There is no mass effect or midline shift. Vascular: There is calcification of the bilateral carotid siphons. Skull: Normal. Negative for fracture or focal lesion. Sinuses/Orbits: The paranasal sinuses are clear. A right glaucoma valve and bilateral lens implants are noted. There is peripheral calcification and atrophy of the left globe which may reflect developing phthisis bulbi. Other: The mastoid air cells and middle ear cavities are clear. IMPRESSION: No acute intracranial pathology. Electronically Signed   By: Lesia Hausen M.D.   On: 04/22/2023 18:49      Signature  -   Susa Raring M.D on 04/25/2023 at 8:30 AM   -  To page go to [email or web address]

## 2023-04-25 NOTE — Progress Notes (Signed)
Patient was found to have CBG of 64, patient c/o "not feeling well". Hypoglycemia standing orders activated and patient was given 12.5 mg of D50. Recheck CBG 15 minutes post D50 was 142. Will continue to monitor.

## 2023-04-25 NOTE — Progress Notes (Addendum)
Received IV consult on behalf of pt. No appropriate veins found at this time for PIV. Assessed L & R arm with [place] attempted x1 with [place] without success. . Primary Rn aware.

## 2023-04-25 NOTE — Inpatient Diabetes Management (Addendum)
Inpatient Diabetes Program Recommendations  AACE/ADA: New Consensus Statement on Inpatient Glycemic Control (2015)  Target Ranges:  Prepandial:   less than 140 mg/dL      Peak postprandial:   less than 180 mg/dL (1-2 hours)      Critically ill patients:  140 - 180 mg/dL   Lab Results  Component Value Date   GLUCAP 178 (H) 04/25/2023   HGBA1C 9.9 (H) 04/24/2023    Review of Glycemic Control  Diabetes history: type 2? Outpatient Diabetes medications: insulin pump? Current orders for Inpatient glycemic control: Novolog 0-15 units correction scale every 4 hours  Inpatient Diabetes Program Recommendations:   Spoke briefly with patient at the bedside. Patient states that she had been wearing her insulin pump at home. She sees Dr. Bufford Buttner, but has not seen for some time. She could not verbalize her basal rates, what her blood sugars run at home, or how long she has had the insulin pump. She stated that her insulin pump was at home. Patient is very confused and seems  not able to see very well. She asked for the light to be on above her bed and it was already on. States that things do not seem to be clear visually.   Patient states that she lives with her husband. Patient seems confused. States that she checks blood sugars with a meter at home four times per day. Discussed her HgbA1C of 9.9%. She did not seem to know what her past A1C had been.   Will continue to monitor blood sugars while in the hospital.  Smith Mince RN BSN CDE Diabetes Coordinator Pager: [phone]  8am-5pm

## 2023-04-25 NOTE — Plan of Care (Signed)
  Problem: Education: Goal: Ability to describe self-care measures that may prevent or decrease complications (Diabetes Survival Skills Education) will improve Outcome: Not Progressing Goal: Individualized Educational Video(s) Outcome: Not Progressing   Problem: Coping: Goal: Ability to adjust to condition or change in health will improve Outcome: Not Progressing   Problem: Fluid Volume: Goal: Ability to maintain a balanced intake and output will improve Outcome: Not Progressing   Problem: Health Behavior/Discharge Planning: Goal: Ability to identify and utilize available resources and services will improve Outcome: Not Progressing Goal: Ability to manage health-related needs will improve Outcome: Not Progressing   Problem: Metabolic: Goal: Ability to maintain appropriate glucose levels will improve Outcome: Not Progressing   Problem: Nutritional: Goal: Maintenance of adequate nutrition will improve Outcome: Not Progressing Goal: Progress toward achieving an optimal weight will improve Outcome: Not Progressing   Problem: Skin Integrity: Goal: Risk for impaired skin integrity will decrease Outcome: Not Progressing   Problem: Tissue Perfusion: Goal: Adequacy of tissue perfusion will improve Outcome: Not Progressing   Problem: Education: Goal: Ability to describe self-care measures that may prevent or decrease complications (Diabetes Survival Skills Education) will improve Outcome: Not Progressing Goal: Individualized Educational Video(s) Outcome: Not Progressing   Problem: Cardiac: Goal: Ability to maintain an adequate cardiac output will improve Outcome: Not Progressing   Problem: Health Behavior/Discharge Planning: Goal: Ability to identify and utilize available resources and services will improve Outcome: Not Progressing Goal: Ability to manage health-related needs will improve Outcome: Not Progressing   Problem: Fluid Volume: Goal: Ability to achieve a  balanced intake and output will improve Outcome: Not Progressing   Problem: Metabolic: Goal: Ability to maintain appropriate glucose levels will improve Outcome: Not Progressing   Problem: Nutritional: Goal: Maintenance of adequate nutrition will improve Outcome: Not Progressing Goal: Maintenance of adequate weight for body size and type will improve Outcome: Not Progressing   Problem: Urinary Elimination: Goal: Ability to achieve and maintain adequate renal perfusion and functioning will improve Outcome: Not Progressing   Problem: Respiratory: Goal: Will regain and/or maintain adequate ventilation Outcome: Not Progressing   Problem: Education: Goal: Knowledge of General Education information will improve Description: Including pain rating scale, medication(s)/side effects and non-pharmacologic comfort measures Outcome: Not Progressing   Problem: Health Behavior/Discharge Planning: Goal: Ability to manage health-related needs will improve Outcome: Not Progressing   Problem: Clinical Measurements: Goal: Ability to maintain clinical measurements within normal limits will improve Outcome: Not Progressing Goal: Will remain free from infection Outcome: Not Progressing Goal: Diagnostic test results will improve Outcome: Not Progressing Goal: Respiratory complications will improve Outcome: Not Progressing Goal: Cardiovascular complication will be avoided Outcome: Not Progressing   Problem: Activity: Goal: Risk for activity intolerance will decrease Outcome: Not Progressing   Problem: Nutrition: Goal: Adequate nutrition will be maintained Outcome: Not Progressing

## 2023-04-26 DIAGNOSIS — E039 Hypothyroidism, unspecified: Secondary | ICD-10-CM | POA: Diagnosis not present

## 2023-04-26 LAB — BASIC METABOLIC PANEL
Anion gap: 12 (ref 5–15)
BUN: 16 mg/dL (ref 8–23)
CO2: 25 mmol/L (ref 22–32)
Calcium: 8.9 mg/dL (ref 8.9–10.3)
Chloride: 103 mmol/L (ref 98–111)
Creatinine, Ser: 0.79 mg/dL (ref 0.44–1.00)
GFR, Estimated: >60 mL/min (ref >60)
Glucose, Bld: 55 mg/dL — ABNORMAL LOW (ref 70–99)
Potassium: 3.2 mmol/L — ABNORMAL LOW (ref 3.5–5.1)
Sodium: 140 mmol/L (ref 135–145)

## 2023-04-26 LAB — PHOSPHORUS: Phosphorus: 1.8 mg/dL — ABNORMAL LOW (ref 2.5–4.6)

## 2023-04-26 LAB — GLUCOSE, CAPILLARY
Glucose-Capillary: 86 mg/dL (ref 70–99)
Glucose-Capillary: 141 mg/dL — ABNORMAL HIGH (ref 70–99)
Glucose-Capillary: 32 mg/dL — CL (ref 70–99)
Glucose-Capillary: 141 mg/dL — ABNORMAL HIGH (ref 70–99)
Glucose-Capillary: 164 mg/dL — ABNORMAL HIGH (ref 70–99)

## 2023-04-26 LAB — CBC WITH DIFFERENTIAL/PLATELET
Abs Immature Granulocytes: 0.06 10*3/uL (ref 0.00–0.07)
Basophils Absolute: 0 10*3/uL (ref 0.0–0.1)
Basophils Relative: 0 %
Eosinophils Absolute: 0.1 10*3/uL (ref 0.0–0.5)
Eosinophils Relative: 1 %
HCT: 29.3 % — ABNORMAL LOW (ref 36.0–46.0)
Hemoglobin: 9.9 g/dL — ABNORMAL LOW (ref 12.0–15.0)
Immature Granulocytes: 1 %
Lymphocytes Relative: 18 %
Lymphs Abs: 1.2 10*3/uL (ref 0.7–4.0)
MCH: 28.2 pg (ref 26.0–34.0)
MCHC: 33.8 g/dL (ref 30.0–36.0)
MCV: 83.5 fL (ref 80.0–100.0)
Monocytes Absolute: 0.5 10*3/uL (ref 0.1–1.0)
Monocytes Relative: 7 %
Neutro Abs: 4.7 10*3/uL (ref 1.7–7.7)
Neutrophils Relative %: 73 %
Platelets: 173 10*3/uL (ref 150–400)
RBC: 3.51 MIL/uL — ABNORMAL LOW (ref 3.87–5.11)
RDW: 14.6 % (ref 11.5–15.5)
WBC: 6.5 10*3/uL (ref 4.0–10.5)
nRBC: 0.5 % — ABNORMAL HIGH (ref 0.0–0.2)

## 2023-04-26 LAB — TSH: TSH: 10.652 u[IU]/mL — ABNORMAL HIGH (ref 0.350–4.500)

## 2023-04-26 LAB — MAGNESIUM: Magnesium: 1.9 mg/dL (ref 1.7–2.4)

## 2023-04-26 LAB — BRAIN NATRIURETIC PEPTIDE: B Natriuretic Peptide: 382.4 pg/mL — ABNORMAL HIGH (ref 0.0–100.0)

## 2023-04-26 LAB — T4, FREE: Free T4: 3.9 ng/dL — ABNORMAL HIGH (ref 0.61–1.12)

## 2023-04-26 MED ORDER — MAGNESIUM SULFATE 4 GM/100ML IV SOLN
4.0000 g | Freq: Once | INTRAVENOUS | Status: AC
  Administered 2023-04-26: 4 g via INTRAVENOUS
  Filled 2023-04-26: qty 100

## 2023-04-26 MED ORDER — LEVOTHYROXINE SODIUM 100 MCG/5ML IV SOLN
200.0000 ug | Freq: Every day | INTRAVENOUS | Status: DC
Start: 2023-04-27 — End: 2023-05-06
  Administered 2023-04-27 – 2023-05-05 (×9): 200 ug via INTRAVENOUS
  Filled 2023-04-26 (×10): qty 10

## 2023-04-26 MED ORDER — POTASSIUM PHOSPHATES 15 MMOLE/5ML IV SOLN
30.0000 mmol | Freq: Once | INTRAVENOUS | Status: AC
  Administered 2023-04-26: 30 mmol via INTRAVENOUS
  Filled 2023-04-26: qty 10

## 2023-04-26 MED ORDER — POTASSIUM CL IN DEXTROSE 5% 20 MEQ/L IV SOLN
INTRAVENOUS | Status: DC
  Filled 2023-04-26: qty 1000

## 2023-04-26 MED ORDER — DEXTROSE 5 % IV SOLN: INTRAVENOUS | Status: DC

## 2023-04-26 MED ORDER — POTASSIUM CHLORIDE 20 MEQ PO PACK
40.0000 meq | PACK | Freq: Once | ORAL | Status: AC
  Administered 2023-04-26: 40 meq via ORAL
  Filled 2023-04-26: qty 2

## 2023-04-26 NOTE — Progress Notes (Addendum)
PROGRESS NOTE                                                                                                                                                                                                             Patient Demographics:    Katherine Navarro, is a 67 y.o. female, DOB - [birth year], AVW:[id number]  Outpatient Primary MD for the patient is Jarold Motto, [place]    LOS - 4  Admit date - 04/22/2023    Chief Complaint  Patient presents with   Weakness       Brief Narrative (HPI from H&P)    DM-1 supposed to be on insulin pump, hypothyroidism and HTN brought to ED with progressive sleepiness over weeks, and admitted for metabolic encephalopathy in the setting of severe hypothyroidism with elevated TSH to 44 and undetectable free T4.  She apparently was noncompliant with her Synthroid medication, head CT, EEG, ABG, ammonia level stable.  She appears to be in myxedema coma, transferred to my care on 04/24/2023.   Subjective:   Patient in bed in no distress, denies any headache or chest pain, does not want to be disturbed to get blood draw.   Assessment  & Plan :   Myxedema coma. Due to noncompliance with Synthroid, TSH more than 40, free T4 initially undetectable, stable head CT, EEG, ammonia and ABG, no focal deficits, placed on high-dose IV Synthroid, if no improvement will add T3, now clinically improving on 04/25/2023, continue to monitor.  Random cortisol above 100, taper off steroids.  AKI on CKD 3A.  Hydrate and monitor.    Hypophosphatemia.  Replaced.    DM type I.  Now every 4 hours sliding scale and monitor.  Check A1c.   CBG (last 3)  Recent Labs    04/25/23 2334 04/26/23 0359 04/26/23 0820  GLUCAP 119* 86 141*   Lab Results  Component Value Date   HGBA1C 9.9 (H) 04/24/2023    Note patient refused her blood draw morning of 04/26/2023, had long discussions with her she said she would like to talk  to her husband about it, husband informed he is coming to talk to the patient.       Condition - Extremely Guarded  Family Communication  :  husband [phone]  on 04/24/23  Code Status :  Full  Consults  :  None  PUD Prophylaxis :    Procedures  :     CT head and EEG - non acute      Disposition Plan  :    Status is: Inpatient   DVT Prophylaxis  :    enoxaparin (LOVENOX) injection 40 mg Start: 04/23/23 1000  Lab Results  Component Value Date   PLT 169 04/25/2023    Diet :  Diet Order             DIET DYS 3 Room service appropriate? Yes with Assist; Fluid consistency: Thin  Diet effective now                    Inpatient Medications  Scheduled Meds:  Chlorhexidine Gluconate Cloth  6 each Topical Daily   docusate sodium  100 mg Oral BID   enoxaparin (LOVENOX) injection  40 mg Subcutaneous Daily   insulin aspart  0-15 Units Subcutaneous Q4H   levothyroxine  200 mcg Intravenous BID   thiamine (VITAMIN B1) injection  100 mg Intravenous Daily   Continuous Infusions:   PRN Meds:.acetaminophen **OR** acetaminophen, dextrose, ondansetron **OR** ondansetron (ZOFRAN) IV, sorbitol  Antibiotics  :    Anti-infectives (From admission, onward)    None         Objective:   Vitals:   04/25/23 2000 04/26/23 0000 04/26/23 0331 04/26/23 0815  BP: (!) 153/80 (!) 141/98 134/85 (!) 146/74  Pulse: 76 72 73 69  Resp: 16 14 14 14   Temp: 97.6 F (36.4 C) 97.6 F (36.4 C) (!) 97.5 F (36.4 C) 97.7 F (36.5 C)  TempSrc: Oral Oral Oral Oral  SpO2: 99% 98%  94%  Weight:      Height:        Wt Readings from Last 3 Encounters:  04/23/23 72.9 kg  03/19/21 72.9 kg  12/18/20 71.7 kg     Intake/Output Summary (Last 24 hours) at 04/26/2023 0910 Last data filed at 04/26/2023 0400 Gross per 24 hour  Intake 39.85 ml  Output 1000 ml  Net -960.15 ml      Physical Exam  Awake and less sleepy, no new F.N deficits, slightly irritable  today, [place].AT,PERRAL Supple Neck, No JVD,   Symmetrical Chest wall movement, Good air movement bilaterally, CTAB RRR,No Gallops,Rubs or new Murmurs,  +ve B.Sounds, Abd Soft, No tenderness,   No Cyanosis, Clubbing or edema       Data Review:    Recent Labs  Lab 04/22/23 1626 04/22/23 1736 04/23/23 1712 04/24/23 0238 04/25/23 0447  WBC 6.7  --  6.5 11.1* 12.4*  HGB 11.8* 12.6 12.8 11.3* 11.2*  HCT 35.7* 37.0 36.9 33.0* 32.7*  PLT 166  --  181 181 169  MCV 85.6  --  84.6 83.1 85.2  MCH 28.3  --  29.4 28.5 29.2  MCHC 33.1  --  34.7 34.2 34.3  RDW 14.8  --  14.6 14.6 14.8  LYMPHSABS 1.1  --  0.7  --  0.8  MONOABS 0.7  --  0.4  --  0.8  EOSABS 0.1  --  0.0  --  0.0  BASOSABS 0.0  --  0.0  --  0.0    Recent Labs  Lab 04/22/23 1626 04/22/23 1637 04/22/23 1736 04/22/23 2039 04/23/23 0903 04/23/23 0903 04/23/23 1712 04/23/23 1718 04/24/23 0238 04/25/23 0447  NA 137  --  134*  --   --   --  136  --  137 138  K 3.7  --  3.5  --   --   --  3.4*  --  3.7 3.5  CL 93*  --   --   --   --   --  95*  --  98 103  CO2 24  --   --   --   --   --  27  --  26 25  ANIONGAP 20*  --   --   --   --   --  14  --  13 10  GLUCOSE 128*  --   --   --   --   --  81  --  368* 130*  BUN 32*  --   --   --   --   --  30*  --  25* 17  CREATININE 1.32*  --   --   --   --   --  1.24*  --  1.17* 0.97  AST 35  --   --   --   --   --  31  --  24  --   ALT 24  --   --   --   --   --  23  --  20  --   ALKPHOS 60  --   --   --   --   --  62  --  57  --   BILITOT 1.4*  --   --   --   --   --  1.1  --  0.9  --   ALBUMIN 3.7  --   --   --   --   --  3.6  --  3.0*  --   LATICACIDVEN  --  1.7  --  2.1*  --   --   --   --   --   --   TSH 44.087*  --   --   --   --   --   --   --   --   --   HGBA1C  --   --   --   --  10.5*   < >  --   --  9.9*  --   AMMONIA  --   --   --   --   --   --   --  23  --   --   BNP  --   --   --   --   --   --   --   --   --  460.8*  MG 2.1  --   --   --  0.8*  --   --  3.7* 2.5*  2.1  CALCIUM 12.0*  --   --   --   --   --  11.5*  --  10.1 9.6   < > = values in this interval not displayed.      Recent Labs  Lab 04/22/23 1626 04/22/23 1637 04/22/23 2039 04/23/23 0903 04/23/23 0903 04/23/23 1712 04/23/23 1718 04/24/23 0238 04/25/23 0447  LATICACIDVEN  --  1.7 2.1*  --   --   --   --   --   --   TSH 44.087*  --   --   --   --   --   --   --   --   HGBA1C  --   --   --  10.5*   < >  --   --  9.9*  --  AMMONIA  --   --   --   --   --   --  23  --   --   BNP  --   --   --   --   --   --   --   --  460.8*  MG 2.1  --   --  0.8*  --   --  3.7* 2.5* 2.1  CALCIUM 12.0*  --   --   --   --  11.5*  --  10.1 9.6   < > = values in this interval not displayed.    --------------------------------------------------------------------------------------------------------------- Lab Results  Component Value Date   CHOL 240 (H) 09/02/2017   HDL 69.90 09/02/2017   LDLCALC 149 (H) 09/02/2017   LDLDIRECT 210.4 05/14/2013   TRIG 106.0 09/02/2017   CHOLHDL 3 09/02/2017    Lab Results  Component Value Date   HGBA1C 9.9 (H) 04/24/2023   No results for input(s): "TSH", "T4TOTAL", "FREET4", "T3FREE", "THYROIDAB" in the last 72 hours.  Recent Labs    04/23/23 1718  VITAMINB12 1,992*   ------------------------------------------------------------------------------------------------------------------ Cardiac Enzymes No results for input(s): "CKMB", "TROPONINI", "MYOGLOBIN" in the last 168 hours.  Invalid input(s): "CK"  Micro Results Recent Results (from the past 240 hour(s))  Urine Culture     Status: None   Collection Time: 04/22/23  4:29 PM   Specimen: Urine, Clean Catch  Result Value Ref Range Status   Specimen Description URINE, CLEAN CATCH  Final   Special Requests NONE  Final   Culture   Final    NO GROWTH Performed at [facility], [address], [place], [place] [address]    Report Status 04/24/2023 FINAL  Final  Culture, blood (single) w  Reflex to ID Panel     Status: None (Preliminary result)   Collection Time: 04/23/23  7:38 AM   Specimen: BLOOD RIGHT ARM  Result Value Ref Range Status   Specimen Description BLOOD RIGHT ARM  Final   Special Requests   Final    BOTTLES DRAWN AEROBIC AND ANAEROBIC Blood Culture adequate volume   Culture   Final    NO GROWTH 2 DAYS Performed at [facility], [address], [place], [place] [address]    Report Status PENDING  Incomplete    Radiology Reports EEG adult  Result Date: 04/23/2023 Charlsie Quest, MD     04/23/2023 12:34 PM Patient Name: ANTWAN BRIBIESCA MRN: [id number] Epilepsy Attending: Charlsie Quest Referring Physician/Provider: Almon Hercules, MD Date:04/23/2023 Duration: 24.50 mins Patient history: 67yo F with ams getting eeg to evaluate for seizure Level of alertness: Awake AEDs during EEG study: None Technical aspects: This EEG study was done with scalp electrodes positioned according to the 10-20 International system of electrode placement. Electrical activity was reviewed with band pass filter of 1-70Hz , sensitivity of 7 uV/mm, display speed of 4mm/sec with a 60Hz  notched filter applied as appropriate. EEG data were recorded continuously and digitally stored.  Video monitoring was available and reviewed as appropriate. Description: The posterior dominant rhythm consists of 8 Hz activity of moderate voltage (25-35 uV) seen predominantly in posterior head regions, symmetric and reactive to eye opening and eye closing. EEG showed intermittent generalized 3 to 6 Hz theta-delta slowing. Hyperventilation and photic stimulation were not performed.   ABNORMALITY - Intermittent slow, generalized IMPRESSION: This study is suggestive of mild diffuse encephalopathy. No seizures or epileptiform discharges were seen throughout the recording. Priyanka O  Melynda Ripple   DG Chest Portable 1 View  Result Date: 04/22/2023 CLINICAL DATA:  Altered mental status EXAM: PORTABLE CHEST 1 VIEW  COMPARISON:  None Available. FINDINGS: The heart size and mediastinal contours are within normal limits. Both lungs are clear. The visualized skeletal structures are unremarkable. IMPRESSION: No active disease. Electronically Signed   By: Charlett Nose M.D.   On: 04/22/2023 19:08   CT Head Wo Contrast  Result Date: 04/22/2023 CLINICAL DATA:  Weakness, altered mental status EXAM: CT HEAD WITHOUT CONTRAST TECHNIQUE: Contiguous axial images were obtained from the base of the skull through the vertex without intravenous contrast. RADIATION DOSE REDUCTION: This exam was performed according to the departmental dose-optimization program which includes automated exposure control, adjustment of the mA and/or kV according to patient size and/or use of iterative reconstruction technique. COMPARISON:  Brain MRI 11/21/2019 FINDINGS: Brain: There is no acute intracranial hemorrhage, extra-axial fluid collection, or acute infarct. There is mild parenchymal volume loss with prominence of the ventricular system and extra-axial CSF spaces. Hypodensity in the supratentorial white matter likely reflects underlying chronic small-vessel ischemic change. The pituitary and suprasellar region are normal. There is no mass lesion. There is no mass effect or midline shift. Vascular: There is calcification of the bilateral carotid siphons. Skull: Normal. Negative for fracture or focal lesion. Sinuses/Orbits: The paranasal sinuses are clear. A right glaucoma valve and bilateral lens implants are noted. There is peripheral calcification and atrophy of the left globe which may reflect developing phthisis bulbi. Other: The mastoid air cells and middle ear cavities are clear. IMPRESSION: No acute intracranial pathology. Electronically Signed   By: Lesia Hausen M.D.   On: 04/22/2023 18:49      Signature  -   Susa Raring M.D on 04/26/2023 at 9:10 AM   -  To page go to [email or web address]

## 2023-04-26 NOTE — Progress Notes (Signed)
SLP Cancellation Note  Patient Details Name: Katherine Navarro MRN: [id number] DOB: 04-29-1956   Cancelled treatment:       Reason Eval/Treat Not Completed: Other (comment) Patient refusing all PO's, telling SLP she doesn't like hospital food and that "it smells like its pureed". She is oriented to day of week but not aware of why she is here, stating only "I thought I passed out". When SLP mentions possible need for temporary feeding tube, she repeatedly states does not want this. No family present. SLP will continue to follow but do not anticipate adequate oral PO intake.  Angela Nevin, MA, CCC-SLP Speech Therapy

## 2023-04-26 NOTE — Progress Notes (Signed)
PT Cancellation Note  Patient Details Name: Katherine Navarro MRN: [id number] DOB: 1955-07-17   Cancelled Treatment:    Reason Eval/Treat Not Completed: (P) Patient declined, no reason specified (Pt declines PT session. PTA discussed benefits of mobility/risks of immobility esp as her bed pad appeared dirty which can cause risk of skin breakdown, however pt continues to defer, RN notified.) Will continue efforts per PT plan of care as schedule permits.   [name] 04/26/2023, 5:08 PM

## 2023-04-26 NOTE — Progress Notes (Signed)
Patient refused personal care, meds and labs this AM.

## 2023-04-26 NOTE — Progress Notes (Signed)
Hypoglycemic Event  CBG: 32  Treatment: D50 50 mL (25 gm)  Symptoms: None  Follow-up CBG: Time:1325  CBG Result:141  Possible Reasons for Event: Inadequate meal intake  Comments/MD notified: Thedore Mins, MD    Serita Grammes Bartlomiej Jenkinson

## 2023-04-27 DIAGNOSIS — E039 Hypothyroidism, unspecified: Secondary | ICD-10-CM | POA: Diagnosis not present

## 2023-04-27 LAB — GLUCOSE, CAPILLARY
Glucose-Capillary: 133 mg/dL — ABNORMAL HIGH (ref 70–99)
Glucose-Capillary: 173 mg/dL — ABNORMAL HIGH (ref 70–99)
Glucose-Capillary: 238 mg/dL — ABNORMAL HIGH (ref 70–99)
Glucose-Capillary: 256 mg/dL — ABNORMAL HIGH (ref 70–99)
Glucose-Capillary: 285 mg/dL — ABNORMAL HIGH (ref 70–99)
Glucose-Capillary: 312 mg/dL — ABNORMAL HIGH (ref 70–99)

## 2023-04-27 LAB — MAGNESIUM: Magnesium: 2.1 mg/dL (ref 1.7–2.4)

## 2023-04-27 LAB — BASIC METABOLIC PANEL
Anion gap: 10 (ref 5–15)
BUN: 15 mg/dL (ref 8–23)
CO2: 25 mmol/L (ref 22–32)
Calcium: 8.7 mg/dL — ABNORMAL LOW (ref 8.9–10.3)
Chloride: 100 mmol/L (ref 98–111)
Creatinine, Ser: 1.03 mg/dL — ABNORMAL HIGH (ref 0.44–1.00)
GFR, Estimated: >60 mL/min (ref >60)
Glucose, Bld: 336 mg/dL — ABNORMAL HIGH (ref 70–99)
Potassium: 3.1 mmol/L — ABNORMAL LOW (ref 3.5–5.1)
Sodium: 135 mmol/L (ref 135–145)

## 2023-04-27 LAB — CBC WITH DIFFERENTIAL/PLATELET
Abs Immature Granulocytes: 0.06 10*3/uL (ref 0.00–0.07)
Basophils Absolute: 0 10*3/uL (ref 0.0–0.1)
Basophils Relative: 0 %
Eosinophils Absolute: 0.1 10*3/uL (ref 0.0–0.5)
Eosinophils Relative: 1 %
HCT: 28.7 % — ABNORMAL LOW (ref 36.0–46.0)
Hemoglobin: 10 g/dL — ABNORMAL LOW (ref 12.0–15.0)
Immature Granulocytes: 1 %
Lymphocytes Relative: 11 %
Lymphs Abs: 0.7 10*3/uL (ref 0.7–4.0)
MCH: 29.2 pg (ref 26.0–34.0)
MCHC: 34.8 g/dL (ref 30.0–36.0)
MCV: 83.7 fL (ref 80.0–100.0)
Monocytes Absolute: 0.6 10*3/uL (ref 0.1–1.0)
Monocytes Relative: 9 %
Neutro Abs: 5 10*3/uL (ref 1.7–7.7)
Neutrophils Relative %: 78 %
Platelets: 176 10*3/uL (ref 150–400)
RBC: 3.43 MIL/uL — ABNORMAL LOW (ref 3.87–5.11)
RDW: 14.6 % (ref 11.5–15.5)
WBC: 6.4 10*3/uL (ref 4.0–10.5)
nRBC: 0.3 % — ABNORMAL HIGH (ref 0.0–0.2)

## 2023-04-27 LAB — VITAMIN B1: Vitamin B1 (Thiamine): <20 nmol/L — ABNORMAL LOW (ref 66.5–200.0)

## 2023-04-27 LAB — PHOSPHORUS: Phosphorus: 2.9 mg/dL (ref 2.5–4.6)

## 2023-04-27 LAB — BRAIN NATRIURETIC PEPTIDE: B Natriuretic Peptide: 171.2 pg/mL — ABNORMAL HIGH (ref 0.0–100.0)

## 2023-04-27 MED ORDER — KCL IN DEXTROSE-NACL 10-5-0.45 MEQ/L-%-% IV SOLN
INTRAVENOUS | Status: DC
  Filled 2023-04-27: qty 1000

## 2023-04-27 MED ORDER — INSULIN ASPART 100 UNIT/ML IJ SOLN
0.0000 [IU] | Freq: Three times a day (TID) | INTRAMUSCULAR | Status: DC
  Administered 2023-04-27: 3 [IU] via SUBCUTANEOUS
  Administered 2023-04-27: 2 [IU] via SUBCUTANEOUS
  Administered 2023-04-27: 4 [IU] via SUBCUTANEOUS
  Administered 2023-04-28 – 2023-04-29 (×3): 3 [IU] via SUBCUTANEOUS
  Administered 2023-04-29: 5 [IU] via SUBCUTANEOUS
  Administered 2023-04-29: 3 [IU] via SUBCUTANEOUS
  Administered 2023-04-30: 2 [IU] via SUBCUTANEOUS
  Administered 2023-04-30 – 2023-05-01 (×4): 3 [IU] via SUBCUTANEOUS
  Administered 2023-05-01: 1 [IU] via SUBCUTANEOUS
  Administered 2023-05-02: 4 [IU] via SUBCUTANEOUS
  Administered 2023-05-02: 5 [IU] via SUBCUTANEOUS
  Administered 2023-05-02: 2 [IU] via SUBCUTANEOUS
  Administered 2023-05-03: 3 [IU] via SUBCUTANEOUS
  Administered 2023-05-03: 2 [IU] via SUBCUTANEOUS
  Administered 2023-05-03 – 2023-05-04 (×2): 1 [IU] via SUBCUTANEOUS
  Administered 2023-05-04: 2 [IU] via SUBCUTANEOUS
  Administered 2023-05-04: 1 [IU] via SUBCUTANEOUS
  Administered 2023-05-05: 5 [IU] via SUBCUTANEOUS
  Administered 2023-05-05: 4 [IU] via SUBCUTANEOUS

## 2023-04-27 MED ORDER — DEXTROSE 5 % IV SOLN: INTRAVENOUS | Status: DC

## 2023-04-27 MED ORDER — POTASSIUM CHLORIDE 2 MEQ/ML IV SOLN
INTRAVENOUS | Status: DC
  Filled 2023-04-27 (×2): qty 1000

## 2023-04-27 NOTE — NC FL2 (Signed)
[place] MEDICAID FL2 LEVEL OF CARE FORM     IDENTIFICATION  Patient Name: Katherine Navarro Birthdate: 27-Nov-1955 Sex: female Admission Date (Current Location): 04/22/2023  [facility] and [place] Number:  Producer, television/film/video and Address:  [facility] [place], [address], [place], [place] [address]      Provider Number: [id number]  Attending Physician Name and Address:  Leroy Sea, MD  Relative Name and Phone Number:       Current Level of Care: Hospital Recommended Level of Care: Skilled Nursing Facility Prior Approval Number:    Date Approved/Denied:   PASRR Number: [id number]  Discharge Plan: SNF    Current Diagnoses: Patient Active Problem List   Diagnosis Date Noted   Severe hypothyroidism 04/22/2023   HNP (herniated nucleus pulposus), cervical 02/06/2020   Atypical glandular cells on cervical Pap smear 10/31/2019   Stenosis of cervix 10/31/2019   Uterine leiomyoma 10/31/2019   Vitamin D deficiency 10/31/2019   Endometrial hyperplasia 12/04/2015   Graves' orbitopathy 07/29/2015   Proliferative diabetic retinopathy of both eyes with macular edema associated with type 2 diabetes mellitus (HCC) 03/25/2015   Numbness 11/19/2014   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) 10/13/2013   Insulin pump in place 10/02/2013   Arthralgia 05/14/2013   Cervical intraepithelial neoplasia grade III with severe dysplasia 12/01/2012   Constipation 07/23/2008   Rectal bleeding 07/23/2008   Hypothyroidism (acquired) 12/13/2007   Hyperlipidemia associated with type 2 diabetes mellitus (HCC) 12/13/2007   Diabetes mellitus (HCC) 01/07/2007   Hypertension associated with diabetes (HCC) 01/07/2007    Orientation RESPIRATION BLADDER Height & Weight     Self  Normal Incontinent, Indwelling catheter Weight: 160 lb 11.5 oz (72.9 kg) Height:  5\' 3"  (160 cm)  BEHAVIORAL SYMPTOMS/MOOD NEUROLOGICAL BOWEL NUTRITION STATUS      Continent Diet (See dc  summary)  AMBULATORY STATUS COMMUNICATION OF NEEDS Skin   Extensive Assist Verbally Normal                       Personal Care Assistance Level of Assistance  Bathing, Feeding, Dressing Bathing Assistance: Maximum assistance Feeding assistance: Limited assistance Dressing Assistance: Maximum assistance     Functional Limitations Info  Sight Sight Info: Impaired        SPECIAL CARE FACTORS FREQUENCY  PT (By licensed PT), OT (By licensed OT)     PT Frequency: 5x/week OT Frequency: 5x/week            Contractures Contractures Info: Not present    Additional Factors Info  Code Status, Allergies, Insulin Sliding Scale Code Status Info: Full Allergies Info: Atorvastatin, Ciprofloxacin, Epinephrine, Erythromycin, Morphine, Peanut-containing Drug Products, Penicillins   Insulin Sliding Scale Info: See dc summary       Current Medications (04/27/2023):  This is the current hospital active medication list Current Facility-Administered Medications  Medication Dose Route Frequency Provider Last Rate Last Admin   acetaminophen (TYLENOL) tablet 650 mg  650 mg Oral Q6H PRN Buena Irish, MD       Or   acetaminophen (TYLENOL) suppository 650 mg  650 mg Rectal Q6H PRN Buena Irish, MD       Chlorhexidine Gluconate Cloth 2 % PADS 6 each  6 each Topical Daily Leroy Sea, MD   6 each at 04/27/23 0834   dextrose 50 % solution 0-50 mL  0-50 mL Intravenous PRN Buena Irish, MD   50 mL at 04/26/23 1227   docusate  sodium (COLACE) capsule 100 mg  100 mg Oral BID Buena Irish, MD   100 mg at 04/25/23 2159   enoxaparin (LOVENOX) injection 40 mg  40 mg Subcutaneous Daily Buena Irish, MD   40 mg at 04/27/23 0830   insulin aspart (novoLOG) injection 0-6 Units  0-6 Units Subcutaneous TID WC Leroy Sea, MD   3 Units at 04/27/23 1133   lactated ringers 1,000 mL with potassium chloride 20 mEq infusion   Intravenous Continuous Leroy Sea, MD 50  mL/hr at 04/27/23 1011 New Bag at 04/27/23 1011   levothyroxine (SYNTHROID, LEVOTHROID) injection 200 mcg  200 mcg Intravenous Daily Susa Raring K, MD   200 mcg at 04/27/23 0829   ondansetron (ZOFRAN) injection 4 mg  4 mg Intravenous Q6H PRN Buena Irish, MD       sorbitol 70 % solution 30 mL  30 mL Oral Daily PRN Buena Irish, MD       thiamine (VITAMIN B1) injection 100 mg  100 mg Intravenous Daily Candelaria Stagers T, MD   100 mg at 04/27/23 0830     Discharge Medications: Please see discharge summary for a list of discharge medications.  Relevant Imaging Results:  Relevant Lab Results:   Additional Information SSN: [id number] [place], [place]

## 2023-04-27 NOTE — Progress Notes (Signed)
Patient refused her am lab draw. Also refused CBG check

## 2023-04-27 NOTE — TOC CM/SW Note (Signed)
Transition of Care [facility]) - Inpatient Brief Assessment   Patient Details  Name: Katherine Navarro MRN: [id number] Date of Birth: Dec 11, 1955  [facility] of [facility]) CM/SW Contact:    Mearl Latin, LCSW Phone Number: 04/27/2023, 10:02 AM   Clinical Narrative: Patient admitted from home with her spouse as caregiver. TOC following for discharge needs and home health recommendation.    Transition of Care Asessment: Insurance and Status: Insurance coverage has been reviewed Patient has primary care physician: Yes Home environment has been reviewed: From home Prior level of function:: Spouse is caregiver Prior/Current Home Services: No current home services Social Determinants of Health Reivew: SDOH reviewed no interventions necessary Readmission risk has been reviewed: Yes Transition of care needs: transition of care needs identified, TOC will continue to follow

## 2023-04-27 NOTE — TOC Progression Note (Signed)
Transition of Care [facility]) - Progression Note    Patient Details  Name: Katherine Navarro MRN: [id number] Date of Birth: 08/04/1955  Transition of Care [facility]) CM/SW Contact  Mearl Latin, LCSW Phone Number: 04/27/2023, 2:57 PM  Clinical Narrative:    CSW spoke with patient's spouse and left SNF bed offers  and ratings list in the room for him to review when he arrives in a little while.    Expected Discharge Plan: Skilled Nursing Facility Barriers to Discharge: Continued Medical Work up, English as a second language teacher, SNF Pending bed offer  Expected Discharge Plan and Services In-house Referral: Clinical Social Work   Post Acute Care Choice: Skilled Nursing Facility Living arrangements for the past 2 months: Single Family Home                                       Social Determinants of Health (SDOH) Interventions SDOH Screenings   Food Insecurity: No Food Insecurity (04/25/2023)  Housing: Low Risk  (04/25/2023)  Transportation Needs: No Transportation Needs (04/25/2023)  Utilities: Not At Risk (04/25/2023)  Depression (PHQ2-9): Low Risk  (10/31/2019)  Tobacco Use: Low Risk  (04/22/2023)    Readmission Risk Interventions     No data to display

## 2023-04-27 NOTE — Progress Notes (Signed)
Physical Therapy Treatment Patient Details Name: Katherine Navarro MRN: [id number] DOB: 1956/04/06 Today's Date: 04/27/2023   History of Present Illness Pt is a 67 y/o F presenting to ED on 11/1 with progressive sleepiness, CT head negative, Admitted for metabolic encephalopathy in setting of hypothyroidism with elevated TSH. PMH includes DM1, hypothyroidism, HTN, diabetic retinopathy    PT Comments  Noted pt remains confused and actually required more assist today than during evaluation. Noted she refused previous attempt and has refused multiple things over the past few days. Was agreeable, however very delayed (verbally and motorically) and required +2 max assist for all mobility. Unable to safely progress to gait. Feel pt will now need post-acute rehab of moderate intensity with <3 hrs/day of therapy.     If plan is discharge home, recommend the following: Assistance with cooking/housework;Assistance with feeding;Help with stairs or ramp for entrance;Direct supervision/assist for medications management;Direct supervision/assist for financial management;Assist for transportation;Supervision due to cognitive status;Two people to help with walking and/or transfers   Can travel by private vehicle     No  Equipment Recommendations  None recommended by PT    Recommendations for Other Services       Precautions / Restrictions Precautions Precautions: Fall Restrictions Weight Bearing Restrictions: No     Mobility  Bed Mobility Overal bed mobility: Needs Assistance Bed Mobility: Supine to Sit, Sit to Supine, Rolling Rolling: Max assist, +2 for physical assistance   Supine to sit: Max assist, +2 for physical assistance, HOB elevated Sit to supine: Total assist, +2 for physical assistance        Transfers Overall transfer level: Needs assistance Equipment used: 2 person hand held assist Transfers: Sit to/from Stand Sit to Stand: Max assist, +2 physical assistance            General transfer comment: unsuccessful 1st attempt; 2nd attempt used momentum and able to come to stand with knees blocked; stood <5 seconds and return to sit    Ambulation/Gait               General Gait Details: not yet appropriate   Stairs             Wheelchair Mobility     Tilt Bed    Modified Rankin (Stroke Patients Only)       Balance Overall balance assessment: Needs assistance Sitting-balance support: No upper extremity supported, Feet supported Sitting balance-Leahy Scale: Poor Sitting balance - Comments: sits EOB with min A   Standing balance support: Bilateral upper extremity supported Standing balance-Leahy Scale: Poor                              Cognition Arousal: Lethargic Behavior During Therapy: Flat affect Overall Cognitive Status: No family/caregiver present to determine baseline cognitive functioning                                 General Comments: not oriented to place, date; oriented to self, DOB; very delayed following commands        Exercises General Exercises - Lower Extremity Ankle Circles/Pumps: AROM, PROM, Both, 5 reps, Supine (followed by heel cord stretch) Heel Slides: AAROM, Both, Supine (resisted extension x 2 reps)    General Comments        Pertinent Vitals/Pain Pain Assessment Pain Assessment: No/denies pain    Home Living  Prior Function            PT Goals (current goals can now be found in the care plan section) Acute Rehab PT Goals Patient Stated Goal: none stated Time For Goal Achievement: 05/07/23 Potential to Achieve Goals: Fair Progress towards PT goals: Not progressing toward goals - comment (required incr assist compared to last session)    Frequency    Min 1X/week      PT Plan      Co-evaluation              AM-PAC PT "6 Clicks" Mobility   Outcome Measure  Help needed turning from your back to your side while  in a flat bed without using bedrails?: Total Help needed moving from lying on your back to sitting on the side of a flat bed without using bedrails?: Total Help needed moving to and from a bed to a chair (including a wheelchair)?: Total Help needed standing up from a chair using your arms (e.g., wheelchair or bedside chair)?: Total Help needed to walk in hospital room?: Total Help needed climbing 3-5 steps with a railing? : Total 6 Click Score: 6    End of Session Equipment Utilized During Treatment: Gait belt Activity Tolerance: Treatment limited secondary to medical complications (Comment) (decr cognition; delayed) Patient left: in bed;with call bell/phone within reach;with bed alarm set   PT Visit Diagnosis: Other abnormalities of gait and mobility (R26.89);Muscle weakness (generalized) (M62.81);Difficulty in walking, not elsewhere classified (R26.2)     Time: 1610-9604 PT Time Calculation (min) (ACUTE ONLY): 21 min  Charges:    $Therapeutic Activity: 8-22 mins PT General Charges $$ ACUTE PT VISIT: 1 Visit                      Jerolyn Center, PT Acute Rehabilitation Services  Office [phone]    Zena Amos 04/27/2023, 10:40 AM

## 2023-04-27 NOTE — Plan of Care (Signed)

## 2023-04-27 NOTE — Progress Notes (Signed)
PROGRESS NOTE                                                                                                                                                                                                             Patient Demographics:    Katherine Navarro, is a 67 y.o. female, DOB - [birth year], WGN:[id number]  Outpatient Primary MD for the patient is Jarold Motto, [place]    LOS - 5  Admit date - 04/22/2023    Chief Complaint  Patient presents with   Weakness       Brief Narrative (HPI from H&P)    DM-1 supposed to be on insulin pump, hypothyroidism and HTN brought to ED with progressive sleepiness over weeks, and admitted for metabolic encephalopathy in the setting of severe hypothyroidism with elevated TSH to 44 and undetectable free T4.  She apparently was noncompliant with her Synthroid medication, head CT, EEG, ABG, ammonia level stable.  She appears to be in myxedema coma, transferred to my care on 04/24/2023.   Subjective:   Patient in bed appears to be in no distress denies any headache or chest pain, does not want to be bothered this morning, does not want to eat anything or get blood draws done.   Assessment  & Plan :   Myxedema coma. Due to noncompliance with Synthroid, TSH more than 40, free T4 initially undetectable, stable head CT, EEG, ammonia and ABG, no focal deficits, placed on high-dose IV Synthroid, random cortisol over 100, TSH and free T4 improving, Synthroid dose dropped on 04/25/2024, mentation gradually improving, suspect there is some underlying psych issue as well, will discuss with husband.  May require psych input as well.  AKI on CKD 3A.  Hydrate and monitor.    Hypophosphatemia, hypokalemia.  Replaced.    DM type I.  Now every 4 hours sliding scale and monitor.  Check A1c.  Will IV fluids as oral intake is pretty poor.   CBG (last 3)  Recent Labs    04/26/23 1325 04/26/23 1507  04/27/23 0129  GLUCAP 141* 164* 256*   Lab Results  Component Value Date   HGBA1C 9.9 (H) 04/24/2023    Note patient is refusing blood draws daily, not eating appropriately, continue gentle IV fluids, question if there is underlying psych issue, will discuss with husband again when available.  Condition - Extremely Guarded  Family Communication  :  husband [phone]  on 04/24/23, 04/26/2023, message left 04/27/2023 at 7:29 AM  Code Status :  Full  Consults  :  None  PUD Prophylaxis :    Procedures  :     CT head and EEG - non acute      Disposition Plan  :    Status is: Inpatient   DVT Prophylaxis  :    enoxaparin (LOVENOX) injection 40 mg Start: 04/23/23 1000  Lab Results  Component Value Date   PLT 173 04/26/2023    Diet :  Diet Order             DIET DYS 3 Room service appropriate? Yes with Assist; Fluid consistency: Thin  Diet effective now                    Inpatient Medications  Scheduled Meds:  Chlorhexidine Gluconate Cloth  6 each Topical Daily   docusate sodium  100 mg Oral BID   enoxaparin (LOVENOX) injection  40 mg Subcutaneous Daily   levothyroxine  200 mcg Intravenous Daily   thiamine (VITAMIN B1) injection  100 mg Intravenous Daily   Continuous Infusions:  dextrose 5 % and 0.45 % NaCl with KCl 10 mEq/L      PRN Meds:.acetaminophen **OR** acetaminophen, dextrose, [DISCONTINUED] ondansetron **OR** ondansetron (ZOFRAN) IV, sorbitol  Antibiotics  :    Anti-infectives (From admission, onward)    None         Objective:   Vitals:   04/26/23 1600 04/26/23 1934 04/26/23 2339 04/27/23 0319  BP: (!) 142/70 (!) 140/82 139/79 (!) 142/87  Pulse:  80 81 89  Resp: 14 17 15 20   Temp: (!) 97.5 F (36.4 C)  97.6 F (36.4 C) 97.6 F (36.4 C)  TempSrc: Oral  Oral Oral  SpO2:   98% 100%  Weight:      Height:        Wt Readings from Last 3 Encounters:  04/23/23 72.9 kg  03/19/21 72.9 kg  12/18/20 71.7 kg      Intake/Output Summary (Last 24 hours) at 04/27/2023 0729 Last data filed at 04/27/2023 0500 Gross per 24 hour  Intake 298.35 ml  Output 2600 ml  Net -2301.65 ml      Physical Exam  Awake and less sleepy, no new F.N deficits, does not want to be disturbed this morning [place].AT,PERRAL Supple Neck, No JVD,   Symmetrical Chest wall movement, Good air movement bilaterally, CTAB RRR,No Gallops,Rubs or new Murmurs,  +ve B.Sounds, Abd Soft, No tenderness,   No Cyanosis, Clubbing or edema       Data Review:    Recent Labs  Lab 04/22/23 1626 04/22/23 1736 04/23/23 1712 04/24/23 0238 04/25/23 0447 04/26/23 1040  WBC 6.7  --  6.5 11.1* 12.4* 6.5  HGB 11.8* 12.6 12.8 11.3* 11.2* 9.9*  HCT 35.7* 37.0 36.9 33.0* 32.7* 29.3*  PLT 166  --  181 181 169 173  MCV 85.6  --  84.6 83.1 85.2 83.5  MCH 28.3  --  29.4 28.5 29.2 28.2  MCHC 33.1  --  34.7 34.2 34.3 33.8  RDW 14.8  --  14.6 14.6 14.8 14.6  LYMPHSABS 1.1  --  0.7  --  0.8 1.2  MONOABS 0.7  --  0.4  --  0.8 0.5  EOSABS 0.1  --  0.0  --  0.0 0.1  BASOSABS 0.0  --  0.0  --  0.0 0.0    Recent Labs  Lab 04/22/23 1626 04/22/23 1626 04/22/23 1637 04/22/23 1736 04/22/23 2039 04/23/23 0903 04/23/23 1712 04/23/23 1718 04/24/23 0238 04/25/23 0447 04/26/23 1040  NA 137  --   --  134*  --   --  136  --  137 138 140  K 3.7  --   --  3.5  --   --  3.4*  --  3.7 3.5 3.2*  CL 93*  --   --   --   --   --  95*  --  98 103 103  CO2 24  --   --   --   --   --  27  --  26 25 25   ANIONGAP 20*  --   --   --   --   --  14  --  13 10 12   GLUCOSE 128*  --   --   --   --   --  81  --  368* 130* 55*  BUN 32*  --   --   --   --   --  30*  --  25* 17 16  CREATININE 1.32*  --   --   --   --   --  1.24*  --  1.17* 0.97 0.79  AST 35  --   --   --   --   --  31  --  24  --   --   ALT 24  --   --   --   --   --  23  --  20  --   --   ALKPHOS 60  --   --   --   --   --  62  --  57  --   --   BILITOT 1.4*  --   --   --   --   --  1.1  --  0.9   --   --   ALBUMIN 3.7  --   --   --   --   --  3.6  --  3.0*  --   --   LATICACIDVEN  --   --  1.7  --  2.1*  --   --   --   --   --   --   TSH 44.087*  --   --   --   --   --   --   --   --   --  10.652*  HGBA1C  --    < >  --   --   --  10.5*  --   --  9.9*  --   --   AMMONIA  --   --   --   --   --   --   --  23  --   --   --   BNP  --   --   --   --   --   --   --   --   --  460.8* 382.4*  MG 2.1  --   --   --   --  0.8*  --  3.7* 2.5* 2.1 1.9  CALCIUM 12.0*  --   --   --   --   --  11.5*  --  10.1 9.6 8.9   < > = values in this interval not displayed.      Recent Labs  Lab 04/22/23  1626 04/22/23 1626 04/22/23 1637 04/22/23 2039 04/23/23 0903 04/23/23 1712 04/23/23 1718 04/24/23 0238 04/25/23 0447 04/26/23 1040  LATICACIDVEN  --   --  1.7 2.1*  --   --   --   --   --   --   TSH 44.087*  --   --   --   --   --   --   --   --  10.652*  HGBA1C  --    < >  --   --  10.5*  --   --  9.9*  --   --   AMMONIA  --   --   --   --   --   --  23  --   --   --   BNP  --   --   --   --   --   --   --   --  460.8* 382.4*  MG 2.1  --   --   --  0.8*  --  3.7* 2.5* 2.1 1.9  CALCIUM 12.0*  --   --   --   --  11.5*  --  10.1 9.6 8.9   < > = values in this interval not displayed.    --------------------------------------------------------------------------------------------------------------- Lab Results  Component Value Date   CHOL 240 (H) 09/02/2017   HDL 69.90 09/02/2017   LDLCALC 149 (H) 09/02/2017   LDLDIRECT 210.4 05/14/2013   TRIG 106.0 09/02/2017   CHOLHDL 3 09/02/2017    Lab Results  Component Value Date   HGBA1C 9.9 (H) 04/24/2023   Recent Labs    04/26/23 1040  TSH 10.652*  FREET4 3.90*    No results for input(s): "VITAMINB12", "FOLATE", "FERRITIN", "TIBC", "IRON", "RETICCTPCT" in the last 72 hours.  ------------------------------------------------------------------------------------------------------------------ Cardiac Enzymes No results for input(s): "CKMB",  "TROPONINI", "MYOGLOBIN" in the last 168 hours.  Invalid input(s): "CK"  Micro Results Recent Results (from the past 240 hour(s))  Urine Culture     Status: None   Collection Time: 04/22/23  4:29 PM   Specimen: Urine, Clean Catch  Result Value Ref Range Status   Specimen Description URINE, CLEAN CATCH  Final   Special Requests NONE  Final   Culture   Final    NO GROWTH Performed at [facility], [address] [place], [place] [address]    Report Status 04/24/2023 FINAL  Final  Culture, blood (single) w Reflex to ID Panel     Status: None (Preliminary result)   Collection Time: 04/23/23  7:38 AM   Specimen: BLOOD RIGHT ARM  Result Value Ref Range Status   Specimen Description BLOOD RIGHT ARM  Final   Special Requests   Final    BOTTLES DRAWN AEROBIC AND ANAEROBIC Blood Culture adequate volume   Culture   Final    NO GROWTH 3 DAYS Performed at [facility], [address], [place], [place] [address]    Report Status PENDING  Incomplete    Radiology Reports EEG adult  Result Date: 04/23/2023 Charlsie Quest, MD     04/23/2023 12:34 PM Patient Name: JORDAN CARAVEO MRN: [id number] Epilepsy Attending: Charlsie Quest Referring Physician/Provider: Almon Hercules, MD Date:04/23/2023 Duration: 24.50 mins Patient history: 67yo F with ams getting eeg to evaluate for seizure Level of alertness: Awake AEDs during EEG study: None Technical aspects: This EEG study was done with scalp electrodes positioned according to the 10-20 International system of electrode placement. Electrical activity  was reviewed with band pass filter of 1-70Hz , sensitivity of 7 uV/mm, display speed of 11mm/sec with a 60Hz  notched filter applied as appropriate. EEG data were recorded continuously and digitally stored.  Video monitoring was available and reviewed as appropriate. Description: The posterior dominant rhythm consists of 8 Hz activity of moderate voltage (25-35 uV) seen predominantly in posterior  head regions, symmetric and reactive to eye opening and eye closing. EEG showed intermittent generalized 3 to 6 Hz theta-delta slowing. Hyperventilation and photic stimulation were not performed.   ABNORMALITY - Intermittent slow, generalized IMPRESSION: This study is suggestive of mild diffuse encephalopathy. No seizures or epileptiform discharges were seen throughout the recording. Charlsie Quest      Signature  -   Susa Raring M.D on 04/27/2023 at 7:29 AM   -  To page go to [email or web address]

## 2023-04-27 NOTE — TOC Initial Note (Signed)
Transition of Care [facility]) - Initial/Assessment Note    Patient Details  Name: Katherine Navarro MRN: [id number] Date of Birth: [birth year]  Transition of Care [facility]) CM/SW Contact:    Mearl Latin, LCSW Phone Number: 04/27/2023, 11:59 AM  Clinical Narrative:                 CSW received consult for possible SNF placement at time of discharge. CSW spoke with patient's spouse. He expressed understanding of PT recommendation and is agreeable to SNF placement at time of discharge in hopes of getting patient stronger. Patient reports preference for [facility]. CSW discussed insurance authorization process and will provide Medicare SNF ratings list. CSW will send out referrals for review and provide bed offers as available.   Skilled Nursing Rehab Facilities-   [email or web address]   Ratings out of 5 stars (5 the highest)   Name Address  Phone # Quality Care Staffing Health Inspection Overall  [facility] [address] [phone] 2 2 5 5   [facility] [address] [place] [phone] 4 2 4 4   [facility] [address], [facility] [phone] 2 1 2 1   [facility] [address], [place] [phone] 3 1 4 3   [facility]  [address], [place] [phone] 4 4 5 5   [facility] [address], [facility] [phone] 3 2 2 2   [facility] [address], [place] [phone] 5 1 2 2   [facility] [address] [place] [phone] 1 1 3 1   [address] ([facility]) [address] [place] [phone] 2 2 2 2   [facility] [address] [place], [place] [phone] 2 2 1 1   [facility] ([place]) [address], [place] [phone] 3 1 1 1   [facility] [address] [place] [phone] 3 3 4 4   [facility] [address] [place] [phone] 2 2 3 3           [facility] [address]  [place] [phone] 4 2 1 1   [facility], [place] [address], [place] [phone] 1 1 2 1   [facility] [address], [place] [phone] 2 1 4 3   [facility] [place] [address] [phone] 2 1 4 3   [facility] [address], [place] [phone] 2 3 3 3           [address] (no [facility]) [address], [place] [phone] 4 5 5 5   [facility] (No Humana) [address] [place] [phone] 1 2 4 3   [facility] [address], [place] [phone] 2 1 2 1   [facility] (No UHC) [address], [place] [phone] 4 1 5 4   [facility] [address] [facility] [phone] 3 4 2 2   [facility] [address] [place] [phone] 2 1 1 1   [facility] [address] [place] [phone] 4 2 5 5   [facility]  [address] [place] [phone] 2 2 3 3   [facility] [address], [place] [phone] 4 1 1 1   [facility] [address] [place], [place] [phone] 2 2 3 3           [facility] [address] [place] [phone] 2 1 1 1   [facility] [address] [place]lyn Clines  [phone] 3 3 3 3   [facility] (No Humana) [address], [place] [phone] 2 2 4 4   [facility]) [address], [place]an [phone] 2 1 1 1   [facility]  [address] [place] [phone] 4 3 5 5   [facility] [place] [address], [place] [phone] 1 1 1 1           [facility] [address] [place], [place] [phone] 5 4 5 5   [facility])  [address], [place] [phone] 1 1 2 1   [facility] [address], [place] [phone]  2 4 4   [facility] [address] [place] [phone] 3 5 5 5   [address] [place], [place] [phone] 4 2 2 2   [facility]) [address] [place] [phone] 2 1 3 2       Expected Discharge Plan: Skilled Nursing Facility Barriers to Discharge: Continued Medical Work up, English as a second language teacher, SNF Pending bed offer   Patient Goals and CMS Choice Patient states their goals for this hospitalization and ongoing recovery are:: Rehab CMS Medicare.gov Compare Post Acute Care list provided to:: Patient Represenative (must comment) Choice offered to / list presented to : Spouse [place] ownership interest in [facility].provided to:: Spouse    Expected Discharge Plan and Services In-house Referral: Clinical Social Work   Post Acute Care Choice: Skilled Nursing Facility Living arrangements for the past 2 months: Single Family Home                                      Prior Living Arrangements/Services Living arrangements for the past 2 months: Single Family Home Lives with:: Spouse Patient language and need for interpreter reviewed:: Yes Do you feel safe going back to the place where you live?: Yes      Need for Family Participation in Patient Care: Yes (Comment) Care giver support system in place?: Yes (comment)   Criminal Activity/Legal Involvement Pertinent to Current Situation/Hospitalization: No - Comment as needed  Activities of Daily Living   ADL Screening (condition at time of admission) Independently performs ADLs?: No Does the patient have a NEW difficulty with bathing/dressing/toileting/self-feeding that is expected to last >3 days?: Yes (Initiates electronic notice to provider for possible OT consult) Does the patient have a NEW difficulty with getting in/out of bed, walking, or climbing stairs that is expected to last >3 days?: Yes (Initiates electronic notice to provider for possible PT consult) Does the patient have a NEW difficulty with communication that is expected to last >3 days?: Yes (Initiates electronic notice to provider for possible SLP consult) Is the patient deaf or have difficulty hearing?: No Does  the patient have difficulty seeing, even when wearing glasses/contacts?: No Does the patient have difficulty concentrating, remembering, or making decisions?: No  Permission Sought/Granted Permission sought to share information with : Facility Medical sales representative, Family Supports Permission granted to share information with : No  Share Information with NAME: Christen Bame  Permission granted to share info w AGENCY: SNFs  Permission granted to share info w Relationship: Spouse  Permission granted to share info w Contact Information: [phone]  Emotional Assessment Appearance:: Appears stated age Attitude/Demeanor/Rapport: Unable to Assess Affect (typically observed): Unable to Assess Orientation: : Oriented to Self Alcohol / Substance Use: Not Applicable Psych Involvement: No (comment)  Admission diagnosis:  Severe hypothyroidism [E03.9] Altered mental status, unspecified altered mental status type [R41.82] Patient Active Problem List   Diagnosis Date Noted  Severe hypothyroidism 04/22/2023   HNP (herniated nucleus pulposus), cervical 02/06/2020   Atypical glandular cells on cervical Pap smear 10/31/2019   Stenosis of cervix 10/31/2019   Uterine leiomyoma 10/31/2019   Vitamin D deficiency 10/31/2019   Endometrial hyperplasia 12/04/2015   Graves' orbitopathy 07/29/2015   Proliferative diabetic retinopathy of both eyes with macular edema associated with type 2 diabetes mellitus (HCC) 03/25/2015   Numbness 11/19/2014   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) 10/13/2013   Insulin pump in place 10/02/2013   Arthralgia 05/14/2013   Cervical intraepithelial neoplasia grade III with severe dysplasia 12/01/2012   Constipation 07/23/2008   Rectal bleeding 07/23/2008   Hypothyroidism (acquired) 12/13/2007   Hyperlipidemia associated with type 2 diabetes mellitus (HCC) 12/13/2007   Diabetes mellitus (HCC) 01/07/2007   Hypertension associated with diabetes (HCC) 01/07/2007    PCP:  Jarold Motto, PA Pharmacy:   [facility] DELIVERY - [address] [place] - [address] [place] [address] Phone: [phone] Fax: [phone]  [facility] [address] - [place], [place] - [address] [place] [address] Phone: [phone] Fax: [phone]  [facility]) - [place], [place] - [address] [facility] [address] [place] [address] [place] [address] Phone: [phone] Fax: [phone]  [facility] [place], [place] - [address] [facility] [address] [place] [address] Phone: [phone] Fax: [phone]     Social Determinants of Health (SDOH) Social History: SDOH Screenings   Food Insecurity: No Food Insecurity (04/25/2023)  Housing: Low Risk  (04/25/2023)  Transportation Needs: No Transportation Needs (04/25/2023)  Utilities: Not At Risk (04/25/2023)  Depression (PHQ2-9): Low Risk  (10/31/2019)  Tobacco Use: Low Risk  (04/22/2023)   SDOH Interventions:     Readmission Risk Interventions     No data to display

## 2023-04-28 DIAGNOSIS — E039 Hypothyroidism, unspecified: Secondary | ICD-10-CM | POA: Diagnosis not present

## 2023-04-28 LAB — CULTURE, BLOOD (SINGLE)
Culture: NO GROWTH
Special Requests: ADEQUATE

## 2023-04-28 LAB — BASIC METABOLIC PANEL
Anion gap: 8 (ref 5–15)
BUN: 10 mg/dL (ref 8–23)
CO2: 28 mmol/L (ref 22–32)
Calcium: 8.5 mg/dL — ABNORMAL LOW (ref 8.9–10.3)
Chloride: 103 mmol/L (ref 98–111)
Creatinine, Ser: 0.69 mg/dL (ref 0.44–1.00)
GFR, Estimated: >60 mL/min (ref >60)
Glucose, Bld: 147 mg/dL — ABNORMAL HIGH (ref 70–99)
Potassium: 2.9 mmol/L — ABNORMAL LOW (ref 3.5–5.1)
Sodium: 139 mmol/L (ref 135–145)

## 2023-04-28 LAB — CBC WITH DIFFERENTIAL/PLATELET
Abs Immature Granulocytes: 0.07 10*3/uL (ref 0.00–0.07)
Basophils Absolute: 0 10*3/uL (ref 0.0–0.1)
Basophils Relative: 0 %
Eosinophils Absolute: 0.1 10*3/uL (ref 0.0–0.5)
Eosinophils Relative: 2 %
HCT: 27.4 % — ABNORMAL LOW (ref 36.0–46.0)
Hemoglobin: 9.4 g/dL — ABNORMAL LOW (ref 12.0–15.0)
Immature Granulocytes: 1 %
Lymphocytes Relative: 21 %
Lymphs Abs: 1.2 10*3/uL (ref 0.7–4.0)
MCH: 29.1 pg (ref 26.0–34.0)
MCHC: 34.3 g/dL (ref 30.0–36.0)
MCV: 84.8 fL (ref 80.0–100.0)
Monocytes Absolute: 0.6 10*3/uL (ref 0.1–1.0)
Monocytes Relative: 10 %
Neutro Abs: 3.6 10*3/uL (ref 1.7–7.7)
Neutrophils Relative %: 66 %
Platelets: 153 10*3/uL (ref 150–400)
RBC: 3.23 MIL/uL — ABNORMAL LOW (ref 3.87–5.11)
RDW: 15 % (ref 11.5–15.5)
WBC: 5.5 10*3/uL (ref 4.0–10.5)
nRBC: 0 % (ref 0.0–0.2)

## 2023-04-28 LAB — MAGNESIUM: Magnesium: 2 mg/dL (ref 1.7–2.4)

## 2023-04-28 LAB — GLUCOSE, CAPILLARY
Glucose-Capillary: 125 mg/dL — ABNORMAL HIGH (ref 70–99)
Glucose-Capillary: 145 mg/dL — ABNORMAL HIGH (ref 70–99)
Glucose-Capillary: 279 mg/dL — ABNORMAL HIGH (ref 70–99)
Glucose-Capillary: 295 mg/dL — ABNORMAL HIGH (ref 70–99)
Glucose-Capillary: 211 mg/dL — ABNORMAL HIGH (ref 70–99)

## 2023-04-28 LAB — PHOSPHORUS: Phosphorus: 1.7 mg/dL — ABNORMAL LOW (ref 2.5–4.6)

## 2023-04-28 LAB — BRAIN NATRIURETIC PEPTIDE: B Natriuretic Peptide: 168.5 pg/mL — ABNORMAL HIGH (ref 0.0–100.0)

## 2023-04-28 MED ORDER — THIAMINE HCL 100 MG/ML IJ SOLN
100.0000 mg | Freq: Every day | INTRAMUSCULAR | Status: DC
  Administered 2023-04-29: 100 mg via INTRAVENOUS
  Filled 2023-04-28: qty 2

## 2023-04-28 MED ORDER — SODIUM PHOSPHATES 45 MMOLE/15ML IV SOLN
30.0000 mmol | Freq: Once | INTRAVENOUS | Status: AC
  Administered 2023-04-28: 30 mmol via INTRAVENOUS
  Filled 2023-04-28: qty 10

## 2023-04-28 MED ORDER — POTASSIUM CHLORIDE 2 MEQ/ML IV SOLN
INTRAVENOUS | Status: DC
  Filled 2023-04-28: qty 1000

## 2023-04-28 MED ORDER — THIAMINE HCL 100 MG/ML IJ SOLN: 100.0000 mg | Freq: Every day | INTRAMUSCULAR | Status: DC

## 2023-04-28 MED ORDER — THIAMINE HCL 100 MG/ML IJ SOLN
500.0000 mg | Freq: Every day | INTRAVENOUS | Status: AC
  Administered 2023-04-29 – 2023-05-01 (×3): 500 mg via INTRAVENOUS
  Filled 2023-04-28 (×3): qty 5

## 2023-04-28 MED ORDER — POTASSIUM CHLORIDE 10 MEQ/100ML IV SOLN
10.0000 meq | INTRAVENOUS | Status: AC
Start: 2023-04-28 — End: 2023-04-28
  Administered 2023-04-28 (×6): 10 meq via INTRAVENOUS
  Filled 2023-04-28 (×5): qty 100

## 2023-04-28 NOTE — Progress Notes (Signed)
Occupational Therapy Treatment Patient Details Name: Katherine Navarro MRN: [id number] DOB: September 17, 1955 Today's Date: 04/28/2023   History of present illness Pt is a 67 y/o F presenting to ED on 11/1 with progressive sleepiness, CT head negative, Admitted for metabolic encephalopathy in setting of hypothyroidism with elevated TSH. PMH includes DM1, hypothyroidism, HTN, diabetic retinopathy   OT comments  Pt continues to have limited gross grasp but the LUE is better than the R and able to hold onto RW much easier. Pt follow commands when prompted and remained pleasant during session. Educated pt on BLE exercises in preparation for completing standing ADLs but she likely needs reinforcement. Pt standing with Mod A +2 today but still not producing steps for gait. OT to continue to progress pt as able.       If plan is discharge home, recommend the following:  Two people to help with walking and/or transfers;A lot of help with bathing/dressing/bathroom;Assistance with cooking/housework;Direct supervision/assist for medications management;Direct supervision/assist for financial management;Assist for transportation;Help with stairs or ramp for entrance;Supervision due to cognitive status   Equipment Recommendations  Other (comment) (defer)    Recommendations for Other Services      Precautions / Restrictions Precautions Precautions: Fall Restrictions Weight Bearing Restrictions: No       Mobility Bed Mobility               General bed mobility comments: Pt up in recliner on arrival    Transfers Overall transfer level: Needs assistance Equipment used: Rolling walker (2 wheels) Transfers: Sit to/from Stand Sit to Stand: Mod assist, +2 physical assistance           General transfer comment: STSx2 from recliner, cues for upright standing posture and manual faciliation to bring chin up. Cues to seperate feet to good BOS, with come mod A to physically lift and reposition RLE. Not  able to offload without significant UE support     Balance Overall balance assessment: Needs assistance Sitting-balance support: No upper extremity supported, Feet supported Sitting balance-Leahy Scale: Poor Sitting balance - Comments: in recliner, not fully assessed   Standing balance support: Bilateral upper extremity supported Standing balance-Leahy Scale: Poor                             ADL either performed or assessed with clinical judgement   ADL Overall ADL's : Needs assistance/impaired     Grooming: Minimal assistance;Wash/dry face Grooming Details (indicate cue type and reason): pt not able to locate wash cloth, needs it positioned in hand for her and cues for initiation                               General ADL Comments: Focused session on progressing with functional mobility, core strengthening while seated in recliner promoting self propulsions into upright sitting position    Extremity/Trunk Assessment              Vision       Perception     Praxis      Cognition Arousal: Alert Behavior During Therapy: Flat affect Overall Cognitive Status: No family/caregiver present to determine baseline cognitive functioning                                 General Comments: Pt disoriented to time and situation, follows commands with  increased time, slow processing        Exercises General Exercises - Lower Extremity Quad Sets: AROM, 5 reps, Seated, Both Short Arc Quad: AROM, Seated, Both, 10 reps Hip Flexion/Marching: Seated, 10 reps, Strengthening, Both    Shoulder Instructions       General Comments      Pertinent Vitals/ Pain       Pain Assessment Pain Assessment: No/denies pain  Home Living                                          Prior Functioning/Environment              Frequency  Min 1X/week        Progress Toward Goals  OT Goals(current goals can now be found in the care  plan section)  Progress towards OT goals: Progressing toward goals  Acute Rehab OT Goals OT Goal Formulation: With patient Time For Goal Achievement: 05/08/23 Potential to Achieve Goals: Good  Plan      Co-evaluation                 AM-PAC OT "6 Clicks" Daily Activity     Outcome Measure   Help from another person eating meals?: A Little (hard for pt to locate her food) Help from another person taking care of personal grooming?: A Little Help from another person toileting, which includes using toliet, bedpan, or urinal?: A Lot Help from another person bathing (including washing, rinsing, drying)?: A Lot Help from another person to put on and taking off regular upper body clothing?: A Lot Help from another person to put on and taking off regular lower body clothing?: A Lot 6 Click Score: 14    End of Session Equipment Utilized During Treatment: Gait belt;Rolling walker (2 wheels)  OT Visit Diagnosis: Unsteadiness on feet (R26.81);Other abnormalities of gait and mobility (R26.89);Muscle weakness (generalized) (M62.81)   Activity Tolerance Patient tolerated treatment well   Patient Left in chair;with call bell/phone within reach;with chair alarm set   Nurse Communication Mobility status        Time: 5784-6962 OT Time Calculation (min): 28 min  Charges: OT General Charges $OT Visit: 1 Visit OT Treatments $Therapeutic Activity: 23-37 mins  04/28/2023  AB, OTR/L  Acute Rehabilitation Services  Office: [phone]   Tristan Schroeder 04/28/2023, 2:47 PM

## 2023-04-28 NOTE — Progress Notes (Addendum)
PROGRESS NOTE                                                                                                                                                                                                             Patient Demographics:    Katherine Navarro, is a 67 y.o. female, DOB - [birth year], HQI:[id number]  Outpatient Primary MD for the patient is Jarold Motto, [place]    LOS - 6  Admit date - 04/22/2023    Chief Complaint  Patient presents with   Weakness       Brief Narrative (HPI from H&P)    DM-1 supposed to be on insulin pump, hypothyroidism and HTN brought to ED with progressive sleepiness over weeks, and admitted for metabolic encephalopathy in the setting of severe hypothyroidism with elevated TSH to 44 and undetectable free T4.  She apparently was noncompliant with her Synthroid medication, head CT, EEG, ABG, ammonia level stable.  She appears to be in myxedema coma, transferred to my care on 04/24/2023.   Subjective:   Patient in bed, appears comfortable, denies any headache, no fever, no chest pain or pressure, no shortness of breath , no abdominal pain. No new focal weakness.   Assessment  & Plan :   Myxedema coma. Due to noncompliance with Synthroid, TSH more than 40, free T4 initially undetectable, stable head CT, EEG, ammonia and ABG, no focal deficits, placed on high-dose IV Synthroid, random cortisol over 100, TSH and free T4 improving, Synthroid dose dropped on 04/25/2024, mentation gradually improving, suspect there is some underlying psych issue as well, but denies, will continue to monitor.  AKI on CKD 3A.  Hydrate and monitor.    Hypophosphatemia, hypokalemia.  Replaced.    Thiamine deficiency.  Placed on replacement.    Likely undiagnosed OSA.  Nighttime oxygen with outpatient sleep study.    DM type I.  Now every 4 hours sliding scale and monitor.  Check A1c.  Will IV fluids as oral intake is  pretty poor.   CBG (last 3)  Recent Labs    04/27/23 2340 04/28/23 0404 04/28/23 0813  GLUCAP 133* 125* 145*   Lab Results  Component Value Date   HGBA1C 9.9 (H) 04/24/2023    Note patient is refusing blood draws daily, not eating appropriately, continue gentle IV fluids, question if there is underlying  psych issue, will discuss with husband again when available.       Condition - Extremely Guarded  Family Communication  :  husband [phone]  on 04/24/23, 04/26/2023, updated 04/27/2023  Code Status :  Full  Consults  :  None  PUD Prophylaxis :    Procedures  :     CT head and EEG - non acute      Disposition Plan  :    Status is: Inpatient   DVT Prophylaxis  :    enoxaparin (LOVENOX) injection 40 mg Start: 04/23/23 1000  Lab Results  Component Value Date   PLT 153 04/28/2023    Diet :  Diet Order             DIET DYS 3 Room service appropriate? Yes with Assist; Fluid consistency: Thin  Diet effective now                    Inpatient Medications  Scheduled Meds:  Chlorhexidine Gluconate Cloth  6 each Topical Daily   docusate sodium  100 mg Oral BID   enoxaparin (LOVENOX) injection  40 mg Subcutaneous Daily   insulin aspart  0-6 Units Subcutaneous TID WC   levothyroxine  200 mcg Intravenous Daily   thiamine (VITAMIN B1) injection  100 mg Intravenous Daily   Continuous Infusions:  lactated ringers 1,000 mL with potassium chloride 20 mEq infusion     potassium chloride 10 mEq (04/28/23 0736)   sodium phosphate 30 mmol in dextrose 5 % 250 mL infusion 30 mmol (04/28/23 0758)    PRN Meds:.acetaminophen **OR** acetaminophen, dextrose, [DISCONTINUED] ondansetron **OR** ondansetron (ZOFRAN) IV, sorbitol  Antibiotics  :    Anti-infectives (From admission, onward)    None         Objective:   Vitals:   04/28/23 0000 04/28/23 0200 04/28/23 0400 04/28/23 0801  BP: 121/61  132/64 139/72  Pulse: 74 74 73 73  Resp: 15 11 13  (!) 9  Temp:    98.3 F (36.8 C) 97.6 F (36.4 C)  TempSrc:   Oral Axillary  SpO2: (!) 63% 99% 100% 95%  Weight:      Height:        Wt Readings from Last 3 Encounters:  04/23/23 72.9 kg  03/19/21 72.9 kg  12/18/20 71.7 kg     Intake/Output Summary (Last 24 hours) at 04/28/2023 0825 Last data filed at 04/28/2023 0542 Gross per 24 hour  Intake 984.7 ml  Output 1650 ml  Net -665.3 ml      Physical Exam  Awake and less sleepy, no new F.N deficits, does not want to be disturbed this morning [place].AT,PERRAL Supple Neck, No JVD,   Symmetrical Chest wall movement, Good air movement bilaterally, CTAB RRR,No Gallops,Rubs or new Murmurs,  +ve B.Sounds, Abd Soft, No tenderness,   No Cyanosis, Clubbing or edema       Data Review:    Recent Labs  Lab 04/23/23 1712 04/24/23 0238 04/25/23 0447 04/26/23 1040 04/27/23 0944 04/28/23 0433  WBC 6.5 11.1* 12.4* 6.5 6.4 5.5  HGB 12.8 11.3* 11.2* 9.9* 10.0* 9.4*  HCT 36.9 33.0* 32.7* 29.3* 28.7* 27.4*  PLT 181 181 169 173 176 153  MCV 84.6 83.1 85.2 83.5 83.7 84.8  MCH 29.4 28.5 29.2 28.2 29.2 29.1  MCHC 34.7 34.2 34.3 33.8 34.8 34.3  RDW 14.6 14.6 14.8 14.6 14.6 15.0  LYMPHSABS 0.7  --  0.8 1.2 0.7 1.2  MONOABS 0.4  --  0.8  0.5 0.6 0.6  EOSABS 0.0  --  0.0 0.1 0.1 0.1  BASOSABS 0.0  --  0.0 0.0 0.0 0.0    Recent Labs  Lab 04/22/23 1626 04/22/23 1637 04/22/23 1736 04/22/23 2039 04/23/23 0903 04/23/23 1712 04/23/23 1718 04/24/23 0238 04/25/23 0447 04/26/23 1040 04/27/23 0944 04/28/23 0433  NA 137  --    < >  --   --  136  --  137 138 140 135 139  K 3.7  --    < >  --   --  3.4*  --  3.7 3.5 3.2* 3.1* 2.9*  CL 93*  --   --   --   --  95*  --  98 103 103 100 103  CO2 24  --   --   --   --  27  --  26 25 25 25 28   ANIONGAP 20*  --   --   --   --  14  --  13 10 12 10 8   GLUCOSE 128*  --   --   --   --  81  --  368* 130* 55* 336* 147*  BUN 32*  --   --   --   --  30*  --  25* 17 16 15 10   CREATININE 1.32*  --   --   --   --  1.24*   --  1.17* 0.97 0.79 1.03* 0.69  AST 35  --   --   --   --  31  --  24  --   --   --   --   ALT 24  --   --   --   --  23  --  20  --   --   --   --   ALKPHOS 60  --   --   --   --  62  --  57  --   --   --   --   BILITOT 1.4*  --   --   --   --  1.1  --  0.9  --   --   --   --   ALBUMIN 3.7  --   --   --   --  3.6  --  3.0*  --   --   --   --   LATICACIDVEN  --  1.7  --  2.1*  --   --   --   --   --   --   --   --   TSH 44.087*  --   --   --   --   --   --   --   --  10.652*  --   --   HGBA1C  --   --   --   --    < >  --   --  9.9*  --   --   --   --   AMMONIA  --   --   --   --   --   --  23  --   --   --   --   --   BNP  --   --   --   --   --   --   --   --  460.8* 382.4* 171.2* 168.5*  MG 2.1  --   --   --    < >  --  3.7*  2.5* 2.1 1.9 2.1 2.0  CALCIUM 12.0*  --   --   --   --  11.5*  --  10.1 9.6 8.9 8.7* 8.5*   < > = values in this interval not displayed.      Recent Labs  Lab 04/22/23 1626 04/22/23 1637 04/22/23 2039 04/23/23 0903 04/23/23 1718 04/24/23 0238 04/25/23 0447 04/26/23 1040 04/27/23 0944 04/28/23 0433  LATICACIDVEN  --  1.7 2.1*  --   --   --   --   --   --   --   TSH 44.087*  --   --   --   --   --   --  10.652*  --   --   HGBA1C  --   --   --    < >  --  9.9*  --   --   --   --   AMMONIA  --   --   --   --  23  --   --   --   --   --   BNP  --   --   --   --   --   --  460.8* 382.4* 171.2* 168.5*  MG 2.1  --   --    < > 3.7* 2.5* 2.1 1.9 2.1 2.0  CALCIUM 12.0*  --   --    < >  --  10.1 9.6 8.9 8.7* 8.5*   < > = values in this interval not displayed.    --------------------------------------------------------------------------------------------------------------- Lab Results  Component Value Date   CHOL 240 (H) 09/02/2017   HDL 69.90 09/02/2017   LDLCALC 149 (H) 09/02/2017   LDLDIRECT 210.4 05/14/2013   TRIG 106.0 09/02/2017   CHOLHDL 3 09/02/2017    Lab Results  Component Value Date   HGBA1C 9.9 (H) 04/24/2023   Recent Labs     04/26/23 1040  TSH 10.652*  FREET4 3.90*    No results for input(s): "VITAMINB12", "FOLATE", "FERRITIN", "TIBC", "IRON", "RETICCTPCT" in the last 72 hours.  ------------------------------------------------------------------------------------------------------------------ Cardiac Enzymes No results for input(s): "CKMB", "TROPONINI", "MYOGLOBIN" in the last 168 hours.  Invalid input(s): "CK"  Micro Results Recent Results (from the past 240 hour(s))  Urine Culture     Status: None   Collection Time: 04/22/23  4:29 PM   Specimen: Urine, Clean Catch  Result Value Ref Range Status   Specimen Description URINE, CLEAN CATCH  Final   Special Requests NONE  Final   Culture   Final    NO GROWTH Performed at [facility], [address], [place], [place] [address]    Report Status 04/24/2023 FINAL  Final  Culture, blood (single) w Reflex to ID Panel     Status: None   Collection Time: 04/23/23  7:38 AM   Specimen: BLOOD RIGHT ARM  Result Value Ref Range Status   Specimen Description BLOOD RIGHT ARM  Final   Special Requests   Final    BOTTLES DRAWN AEROBIC AND ANAEROBIC Blood Culture adequate volume   Culture   Final    NO GROWTH 5 DAYS Performed at [facility], [address] [place], [place] [address]    Report Status 04/28/2023 FINAL  Final    Radiology Reports No results found.    Signature  -   Susa Raring M.D on 04/28/2023 at 8:25 AM   -  To page go to [email or web address]

## 2023-04-28 NOTE — Plan of Care (Signed)
  Problem: Clinical Measurements: Goal: Will remain free from infection Outcome: Progressing Goal: Diagnostic test results will improve Outcome: Progressing Goal: Respiratory complications will improve Outcome: Progressing   Problem: Activity: Goal: Risk for activity intolerance will decrease Outcome: Progressing   

## 2023-04-28 NOTE — Progress Notes (Signed)
Speech Language Pathology Treatment: Dysphagia  Patient Details Name: Katherine Navarro MRN: [id number] DOB: 10-Sep-1955 Today's Date: 04/28/2023 Time: 0865-7846 SLP Time Calculation (min) (ACUTE ONLY): 22 min  Assessment / Plan / Recommendation Clinical Impression  Patient seen by SLP for skilled intervention focused on diet toleration monitoring. Patient was markedly more alert and cooperative today than on previous dates. She presented with far less confusion and was orientated x4. Rn reported patient consumed approximately 20% of her lunch. When prompted, patient requested Splenda for her iced tea. SLP directly observed patient with thin liquid via straw. Patient took multiple, sequential sips from straw with no overt s/sx of aspiration. Swallow initiation appeared timely. SLP recommend patient continue Dys 3 (mechanical soft), thin liquids at this time. ST to follow up at least once more to ensure toleration of least restrictive diet.   HPI HPI: Patient is a 67 y.o. female with PMH: hypothyroidism, type 1 DM, glaucoma, HTN who has not been compliant with her thyroid medication or diabetes management/medications. She presented to the hospital on 04/22/2023 from  home because of progressive sleepiness over past weeks. CT head was negative. She was admitted for metabolic encephalopathy in the setting of hypothyroidism with elevated TSH.      SLP Plan  Continue with current plan of care      Recommendations for follow up therapy are one component of a multi-disciplinary discharge planning process, led by the attending physician.  Recommendations may be updated based on patient status, additional functional criteria and insurance authorization.    Recommendations  Diet recommendations: Dysphagia 3 (mechanical soft);Thin liquid Liquids provided via: Cup;Straw Medication Administration: Whole meds with liquid Supervision: Staff to assist with self feeding;Full supervision/cueing for compensatory  strategies Compensations: Slow rate;Small sips/bites;Minimize environmental distractions Postural Changes and/or Swallow Maneuvers: Seated upright 90 degrees                  Oral care BID   Intermittent Supervision/Assistance Dysphagia, unspecified (R13.10)     Continue with current plan of care     Marline Backbone, Senaida Lange., Speech Therapy Student    04/28/2023, 2:30 PM

## 2023-04-28 NOTE — TOC Progression Note (Signed)
Transition of Care [facility]) - Progression Note    Patient Details  Name: Katherine Navarro MRN: [id number] Date of Birth: [birth year]  Transition of [facility]) CM/SW Contact  Modesty Rudy Reeves Forth, Student-Social Work Phone Number: 04/28/2023, 1:24 PM  Clinical Narrative:    MSW Intern called pt's husband and spoke with him about SNF offers. MSW Intern provided pt husband with a call back phone number and asked if he could call us back before 5PM with a decision. Pt husband agreed and plans to call back this evening.   Expected Discharge Plan: Skilled Nursing Facility Barriers to Discharge: Continued Medical Work up, English as a second language teacher, SNF Pending bed offer  Expected Discharge Plan and Services In-house Referral: Clinical Social Work   Post Acute Care Choice: Skilled Nursing Facility Living arrangements for the past 2 months: Single Family Home                                       Social Determinants of Health (SDOH) Interventions SDOH Screenings   Food Insecurity: No Food Insecurity (04/25/2023)  Housing: Low Risk  (04/25/2023)  Transportation Needs: No Transportation Needs (04/25/2023)  Utilities: Not At Risk (04/25/2023)  Depression (PHQ2-9): Low Risk  (10/31/2019)  Tobacco Use: Low Risk  (04/22/2023)    Readmission Risk Interventions     No data to display

## 2023-04-28 NOTE — Progress Notes (Signed)
MD made aware. Pt apneic

## 2023-04-29 DIAGNOSIS — E039 Hypothyroidism, unspecified: Secondary | ICD-10-CM | POA: Diagnosis not present

## 2023-04-29 LAB — CBC WITH DIFFERENTIAL/PLATELET
Abs Immature Granulocytes: 0.06 10*3/uL (ref 0.00–0.07)
Basophils Absolute: 0 10*3/uL (ref 0.0–0.1)
Basophils Relative: 0 %
Eosinophils Absolute: 0.1 10*3/uL (ref 0.0–0.5)
Eosinophils Relative: 2 %
HCT: 24.2 % — ABNORMAL LOW (ref 36.0–46.0)
Hemoglobin: 8 g/dL — ABNORMAL LOW (ref 12.0–15.0)
Immature Granulocytes: 1 %
Lymphocytes Relative: 22 %
Lymphs Abs: 1.2 10*3/uL (ref 0.7–4.0)
MCH: 28.2 pg (ref 26.0–34.0)
MCHC: 33.1 g/dL (ref 30.0–36.0)
MCV: 85.2 fL (ref 80.0–100.0)
Monocytes Absolute: 0.5 10*3/uL (ref 0.1–1.0)
Monocytes Relative: 9 %
Neutro Abs: 3.7 10*3/uL (ref 1.7–7.7)
Neutrophils Relative %: 66 %
Platelets: 166 10*3/uL (ref 150–400)
RBC: 2.84 MIL/uL — ABNORMAL LOW (ref 3.87–5.11)
RDW: 15.4 % (ref 11.5–15.5)
WBC: 5.6 10*3/uL (ref 4.0–10.5)
nRBC: 0 % (ref 0.0–0.2)

## 2023-04-29 LAB — BASIC METABOLIC PANEL
Anion gap: 8 (ref 5–15)
BUN: 14 mg/dL (ref 8–23)
CO2: 27 mmol/L (ref 22–32)
Calcium: 7.7 mg/dL — ABNORMAL LOW (ref 8.9–10.3)
Chloride: 104 mmol/L (ref 98–111)
Creatinine, Ser: 0.97 mg/dL (ref 0.44–1.00)
GFR, Estimated: >60 mL/min (ref >60)
Glucose, Bld: 251 mg/dL — ABNORMAL HIGH (ref 70–99)
Potassium: 3.6 mmol/L (ref 3.5–5.1)
Sodium: 139 mmol/L (ref 135–145)

## 2023-04-29 LAB — MAGNESIUM: Magnesium: 1.7 mg/dL (ref 1.7–2.4)

## 2023-04-29 LAB — GLUCOSE, CAPILLARY
Glucose-Capillary: 200 mg/dL — ABNORMAL HIGH (ref 70–99)
Glucose-Capillary: 238 mg/dL — ABNORMAL HIGH (ref 70–99)
Glucose-Capillary: 256 mg/dL — ABNORMAL HIGH (ref 70–99)
Glucose-Capillary: 293 mg/dL — ABNORMAL HIGH (ref 70–99)
Glucose-Capillary: 322 mg/dL — ABNORMAL HIGH (ref 70–99)
Glucose-Capillary: 363 mg/dL — ABNORMAL HIGH (ref 70–99)

## 2023-04-29 LAB — PHOSPHORUS: Phosphorus: 2.2 mg/dL — ABNORMAL LOW (ref 2.5–4.6)

## 2023-04-29 MED ORDER — MEGESTROL ACETATE 40 MG PO TABS
40.0000 mg | ORAL_TABLET | Freq: Every day | ORAL | Status: DC
  Administered 2023-04-29 – 2023-05-08 (×10): 40 mg via ORAL
  Filled 2023-04-29 (×10): qty 1

## 2023-04-29 MED ORDER — THIAMINE HCL 100 MG/ML IJ SOLN
100.0000 mg | Freq: Every day | INTRAMUSCULAR | Status: DC
  Administered 2023-05-02 – 2023-05-08 (×7): 100 mg via INTRAVENOUS
  Filled 2023-04-29 (×7): qty 2

## 2023-04-29 MED ORDER — TAMSULOSIN HCL 0.4 MG PO CAPS
0.4000 mg | ORAL_CAPSULE | Freq: Every day | ORAL | Status: DC
  Administered 2023-04-29 – 2023-05-20 (×21): 0.4 mg via ORAL
  Filled 2023-04-29 (×21): qty 1

## 2023-04-29 MED ORDER — POTASSIUM PHOSPHATES 15 MMOLE/5ML IV SOLN
30.0000 mmol | Freq: Once | INTRAVENOUS | Status: AC
  Administered 2023-04-29: 30 mmol via INTRAVENOUS
  Filled 2023-04-29: qty 10

## 2023-04-29 NOTE — Plan of Care (Signed)
  Problem: Skin Integrity: Goal: Risk for impaired skin integrity will decrease Outcome: Progressing   Problem: Respiratory: Goal: Will regain and/or maintain adequate ventilation Outcome: Progressing   Problem: Clinical Measurements: Goal: Will remain free from infection Outcome: Progressing Goal: Respiratory complications will improve Outcome: Progressing

## 2023-04-29 NOTE — TOC Progression Note (Signed)
Transition of Care [facility]) - Progression Note    Patient Details  Name: Katherine Navarro MRN: [id number] Date of Birth: [birth year]  Transition [facility] Care [facility]) CM/SW Contact  Rosland Riding Reeves Forth, Student-Social Work Phone Number: 04/29/2023, 9:41 AM  Clinical Narrative:    MSW Intern spoke with pt's spouse over the phone to see if they had chosen a SNF yet. Pt husband said they had not decided on one yet and MSW Intern mentioned that bed availability changes and are on a first come first serve basis. Pt husband said they would let us know when they had decided.    Expected Discharge Plan: Skilled Nursing Facility Barriers to Discharge: Continued Medical Work up, English as a second language teacher, SNF Pending bed offer  Expected Discharge Plan and Services In-house Referral: Clinical Social Work   Post Acute Care Choice: Skilled Nursing Facility Living arrangements for the past 2 months: Single Family Home                                       Social Determinants of Health (SDOH) Interventions SDOH Screenings   Food Insecurity: No Food Insecurity (04/25/2023)  Housing: Low Risk  (04/25/2023)  Transportation Needs: No Transportation Needs (04/25/2023)  Utilities: Not At Risk (04/25/2023)  Depression (PHQ2-9): Low Risk  (10/31/2019)  Tobacco Use: Low Risk  (04/22/2023)    Readmission Risk Interventions     No data to display

## 2023-04-29 NOTE — Progress Notes (Signed)
PROGRESS NOTE                                                                                                                                                                                                             Patient Demographics:    Katherine Navarro, is a 67 y.o. female, DOB - December 27, 1955, ZOX:[id number]  Outpatient Primary MD for the patient is Jarold Motto, [place]    LOS - 7  Admit date - 04/22/2023    Chief Complaint  Patient presents with   Weakness       Brief Narrative (HPI from H&P)    DM-1 supposed to be on insulin pump, hypothyroidism and HTN brought to ED with progressive sleepiness over weeks, and admitted for metabolic encephalopathy in the setting of severe hypothyroidism with elevated TSH to 44 and undetectable free T4.  She apparently was noncompliant with her Synthroid medication, head CT, EEG, ABG, ammonia level stable.  She appears to be in myxedema coma, transferred to my care on 04/24/2023.   Subjective:   Patient in bed denies any headache chest or abdominal pain, claims that she is going to sit in the chair today and eat better.  Assessment  & Plan :   Myxedema coma. Due to noncompliance with Synthroid, TSH more than 40, free T4 initially undetectable, stable head CT, EEG, ammonia and ABG, no focal deficits, placed on high-dose IV Synthroid, random cortisol over 100, TSH and free T4 improving, Synthroid dose dropped on 04/25/2024, mentation gradually improving, suspect there is some underlying psych issue as well, but denies, will continue to monitor ASH, free T4 and clinically.  AKI on CKD 3A.  Hydrate and monitor.  Resolved.  Hypophosphatemia, hypokalemia.  Replaced.    Thiamine deficiency.  Placed on replacement.    Urinary retention.  Foley Flomax, removal of Foley trial on 04/29/2023.  Bladder scan.  Likely undiagnosed OSA.  Nighttime oxygen with outpatient sleep study.    DM type I.  Now  every 4 hours sliding scale and monitor.  Check A1c.  Will IV fluids as oral intake is pretty poor.   CBG (last 3)  Recent Labs    04/28/23 1627 04/28/23 2008 04/29/23 0002  GLUCAP 295* 211* 200*   Lab Results  Component Value Date   HGBA1C 9.9 (H) 04/24/2023  Condition - Extremely Guarded  Family Communication  :  husband [phone]  on 04/24/23, 04/26/2023, updated 04/27/2023  Code Status :  Full  Consults  :  None  PUD Prophylaxis :    Procedures  :     CT head and EEG - non acute      Disposition Plan  :    Status is: Inpatient   DVT Prophylaxis  :    enoxaparin (LOVENOX) injection 40 mg Start: 04/23/23 1000  Lab Results  Component Value Date   PLT 166 04/29/2023    Diet :  Diet Order             DIET DYS 3 Room service appropriate? Yes with Assist; Fluid consistency: Thin  Diet effective now                    Inpatient Medications  Scheduled Meds:  Chlorhexidine Gluconate Cloth  6 each Topical Daily   docusate sodium  100 mg Oral BID   enoxaparin (LOVENOX) injection  40 mg Subcutaneous Daily   insulin aspart  0-6 Units Subcutaneous TID WC   levothyroxine  200 mcg Intravenous Daily   megestrol  40 mg Oral Daily   thiamine (VITAMIN B1) injection  100 mg Intravenous Daily   Continuous Infusions:  potassium PHOSPHATE IVPB (in mmol)     thiamine (VITAMIN B1) injection      PRN Meds:.acetaminophen **OR** acetaminophen, dextrose, [DISCONTINUED] ondansetron **OR** ondansetron (ZOFRAN) IV, sorbitol  Antibiotics  :    Anti-infectives (From admission, onward)    None         Objective:   Vitals:   04/28/23 2006 04/28/23 2200 04/29/23 0000 04/29/23 0400  BP: (!) 156/93  (!) 165/92 122/72  Pulse:  82 85 82  Resp:  11 12 11   Temp: 98.3 F (36.8 C)  98.5 F (36.9 C) 98.1 F (36.7 C)  TempSrc: Oral  Oral Oral  SpO2:  99% 100% 98%  Weight:      Height:        Wt Readings from Last 3 Encounters:  04/23/23 72.9 kg   03/19/21 72.9 kg  12/18/20 71.7 kg     Intake/Output Summary (Last 24 hours) at 04/29/2023 0752 Last data filed at 04/29/2023 0511 Gross per 24 hour  Intake 777.46 ml  Output 1700 ml  Net -922.54 ml      Physical Exam  Awake, less sleepy, no focal deficits, following basic commands Weir.AT,PERRAL Supple Neck, No JVD,   Symmetrical Chest wall movement, Good air movement bilaterally, CTAB RRR,No Gallops,Rubs or new Murmurs,  +ve B.Sounds, Abd Soft, No tenderness,   No Cyanosis, Clubbing or edema       Data Review:    Recent Labs  Lab 04/25/23 0447 04/26/23 1040 04/27/23 0944 04/28/23 0433 04/29/23 0451  WBC 12.4* 6.5 6.4 5.5 5.6  HGB 11.2* 9.9* 10.0* 9.4* 8.0*  HCT 32.7* 29.3* 28.7* 27.4* 24.2*  PLT 169 173 176 153 166  MCV 85.2 83.5 83.7 84.8 85.2  MCH 29.2 28.2 29.2 29.1 28.2  MCHC 34.3 33.8 34.8 34.3 33.1  RDW 14.8 14.6 14.6 15.0 15.4  LYMPHSABS 0.8 1.2 0.7 1.2 1.2  MONOABS 0.8 0.5 0.6 0.6 0.5  EOSABS 0.0 0.1 0.1 0.1 0.1  BASOSABS 0.0 0.0 0.0 0.0 0.0    Recent Labs  Lab 04/22/23 1626 04/22/23 1637 04/22/23 1736 04/22/23 2039 04/23/23 0903 04/23/23 1712 04/23/23 1718 04/24/23 0238 04/25/23 0447 04/26/23 1040 04/27/23 0944 04/28/23 0433 04/29/23  0451  NA 137  --    < >  --   --  136  --  137 138 140 135 139 139  K 3.7  --    < >  --   --  3.4*  --  3.7 3.5 3.2* 3.1* 2.9* 3.6  CL 93*  --   --   --   --  95*  --  98 103 103 100 103 104  CO2 24  --   --   --   --  27  --  26 25 25 25 28 27   ANIONGAP 20*  --   --   --   --  14  --  13 10 12 10 8 8   GLUCOSE 128*  --   --   --   --  81  --  368* 130* 55* 336* 147* 251*  BUN 32*  --   --   --   --  30*  --  25* 17 16 15 10 14   CREATININE 1.32*  --   --   --   --  1.24*  --  1.17* 0.97 0.79 1.03* 0.69 0.97  AST 35  --   --   --   --  31  --  24  --   --   --   --   --   ALT 24  --   --   --   --  23  --  20  --   --   --   --   --   ALKPHOS 60  --   --   --   --  62  --  57  --   --   --   --   --    BILITOT 1.4*  --   --   --   --  1.1  --  0.9  --   --   --   --   --   ALBUMIN 3.7  --   --   --   --  3.6  --  3.0*  --   --   --   --   --   LATICACIDVEN  --  1.7  --  2.1*  --   --   --   --   --   --   --   --   --   TSH 44.087*  --   --   --   --   --   --   --   --  10.652*  --   --   --   HGBA1C  --   --   --   --    < >  --   --  9.9*  --   --   --   --   --   AMMONIA  --   --   --   --   --   --  23  --   --   --   --   --   --   BNP  --   --   --   --   --   --   --   --  460.8* 382.4* 171.2* 168.5*  --   MG 2.1  --   --   --    < >  --  3.7* 2.5* 2.1 1.9 2.1 2.0 1.7  CALCIUM 12.0*  --   --   --   --  11.5*  --  10.1 9.6 8.9 8.7* 8.5* 7.7*   < > = values in this interval not displayed.      Recent Labs  Lab 04/22/23 1626 04/22/23 1637 04/22/23 2039 04/23/23 0903 04/23/23 1718 04/24/23 0238 04/25/23 0447 04/26/23 1040 04/27/23 0944 04/28/23 0433 04/29/23 0451  LATICACIDVEN  --  1.7 2.1*  --   --   --   --   --   --   --   --   TSH 44.087*  --   --   --   --   --   --  10.652*  --   --   --   HGBA1C  --   --   --    < >  --  9.9*  --   --   --   --   --   AMMONIA  --   --   --   --  23  --   --   --   --   --   --   BNP  --   --   --   --   --   --  460.8* 382.4* 171.2* 168.5*  --   MG 2.1  --   --    < > 3.7* 2.5* 2.1 1.9 2.1 2.0 1.7  CALCIUM 12.0*  --   --    < >  --  10.1 9.6 8.9 8.7* 8.5* 7.7*   < > = values in this interval not displayed.    --------------------------------------------------------------------------------------------------------------- Lab Results  Component Value Date   CHOL 240 (H) 09/02/2017   HDL 69.90 09/02/2017   LDLCALC 149 (H) 09/02/2017   LDLDIRECT 210.4 05/14/2013   TRIG 106.0 09/02/2017   CHOLHDL 3 09/02/2017    Lab Results  Component Value Date   HGBA1C 9.9 (H) 04/24/2023   Recent Labs    04/26/23 1040  TSH 10.652*  FREET4 3.90*    No results for input(s): "VITAMINB12", "FOLATE", "FERRITIN", "TIBC", "IRON",  "RETICCTPCT" in the last 72 hours.  ------------------------------------------------------------------------------------------------------------------ Cardiac Enzymes No results for input(s): "CKMB", "TROPONINI", "MYOGLOBIN" in the last 168 hours.  Invalid input(s): "CK"  Micro Results Recent Results (from the past 240 hour(s))  Urine Culture     Status: None   Collection Time: 04/22/23  4:29 PM   Specimen: Urine, Clean Catch  Result Value Ref Range Status   Specimen Description URINE, CLEAN CATCH  Final   Special Requests NONE  Final   Culture   Final    NO GROWTH Performed at [facility], [address], [place], [place] [address]    Report Status 04/24/2023 FINAL  Final  Culture, blood (single) w Reflex to ID Panel     Status: None   Collection Time: 04/23/23  7:38 AM   Specimen: BLOOD RIGHT ARM  Result Value Ref Range Status   Specimen Description BLOOD RIGHT ARM  Final   Special Requests   Final    BOTTLES DRAWN AEROBIC AND ANAEROBIC Blood Culture adequate volume   Culture   Final    NO GROWTH 5 DAYS Performed at [facility], [address] [place], [place] [address]    Report Status 04/28/2023 FINAL  Final    Radiology Reports No results found.    Signature  -   Susa Raring M.D on 04/29/2023 at 7:52 AM   -  To page go to [email or web address]

## 2023-04-29 NOTE — Plan of Care (Signed)

## 2023-04-29 NOTE — Progress Notes (Signed)
Physical Therapy Treatment Patient Details Name: Katherine Navarro MRN: [id number] DOB: Jul 18, 1955 Today's Date: 04/29/2023   History of Present Illness Pt is a 67 y/o F presenting to ED on 11/1 with progressive sleepiness, CT head negative, Admitted for metabolic encephalopathy in setting of hypothyroidism with elevated TSH. PMH includes DM1, hypothyroidism, HTN, diabetic retinopathy    PT Comments  Pt received sitting in the recliner and agreeable to session. Pt able to tolerate limited BLE exercise, however is limited by quick fatigue. Pt requires total A to scoot forward in the recliner,however is able to lean trunk forward with min A. Pt initially agreeable to standing, however then declines before attempting due to fatigue. Pt unable to push on armrests for support during repositioning despite cues. Some edema noted in RUE below IV site, RN notified. Pt continues to benefit from PT services to progress toward functional mobility goals.      If plan is discharge home, recommend the following: Assistance with cooking/housework;Assistance with feeding;Help with stairs or ramp for entrance;Direct supervision/assist for medications management;Direct supervision/assist for financial management;Assist for transportation;Supervision due to cognitive status;Two people to help with walking and/or transfers   Can travel by private vehicle     No  Equipment Recommendations  None recommended by PT    Recommendations for Other Services       Precautions / Restrictions Precautions Precautions: Fall Restrictions Weight Bearing Restrictions: No     Mobility  Bed Mobility               General bed mobility comments: Pt up in recliner on arrival    Transfers                   General transfer comment: Pt requires total A to scoot forward and backward in recliner despite dense cues for use of BUE. Pt initially agreeable to STS, however then declined prior to attempt         Balance Overall balance assessment: Needs assistance Sitting-balance support: No upper extremity supported, Feet supported Sitting balance-Leahy Scale: Poor Sitting balance - Comments: in recliner with pt able to perform anterior lean with cues, but fatigues quickly and returns to leaning against back rest       Standing balance comment: NT                            Cognition Arousal: Alert Behavior During Therapy: Flat affect Overall Cognitive Status: No family/caregiver present to determine baseline cognitive functioning                                 General Comments: Slow processing requiring increased time for initiation and increased cues for participation        Exercises General Exercises - Lower Extremity Long Arc Quad: AROM, Seated, Both, 10 reps Hip Flexion/Marching: Seated, Strengthening, Both, 5 reps, AROM    General Comments        Pertinent Vitals/Pain Pain Assessment Pain Assessment: Faces Faces Pain Scale: Hurts a little bit Pain Location: RUE Pain Descriptors / Indicators: Guarding, Grimacing Pain Intervention(s): Monitored during session, Repositioned     PT Goals (current goals can now be found in the care plan section) Acute Rehab PT Goals Patient Stated Goal: none stated PT Goal Formulation: With patient/family Time For Goal Achievement: 05/07/23 Progress towards PT goals: Not progressing toward goals - comment (limited by fatigue)  Frequency    Min 1X/week       AM-PAC PT "6 Clicks" Mobility   Outcome Measure  Help needed turning from your back to your side while in a flat bed without using bedrails?: Total Help needed moving from lying on your back to sitting on the side of a flat bed without using bedrails?: Total Help needed moving to and from a bed to a chair (including a wheelchair)?: Total Help needed standing up from a chair using your arms (e.g., wheelchair or bedside chair)?: Total Help needed  to walk in hospital room?: Total Help needed climbing 3-5 steps with a railing? : Total 6 Click Score: 6    End of Session Equipment Utilized During Treatment: Gait belt Activity Tolerance: Patient limited by fatigue Patient left: in chair;with chair alarm set;with call bell/phone within reach Nurse Communication: Mobility status PT Visit Diagnosis: Other abnormalities of gait and mobility (R26.89);Muscle weakness (generalized) (M62.81);Difficulty in walking, not elsewhere classified (R26.2)     Time: 1610-9604 PT Time Calculation (min) (ACUTE ONLY): 21 min  Charges:    $Therapeutic Exercise: 8-22 mins PT General Charges $$ ACUTE PT VISIT: 1 Visit                     Johny Shock, PTA Acute Rehabilitation Services Secure Chat Preferred  [phone]    Johny Shock 04/29/2023, 2:12 PM

## 2023-04-30 ENCOUNTER — Inpatient Hospital Stay (HOSPITAL_COMMUNITY): Payer: Medicare HMO

## 2023-04-30 DIAGNOSIS — E039 Hypothyroidism, unspecified: Secondary | ICD-10-CM | POA: Diagnosis not present

## 2023-04-30 LAB — CBC WITH DIFFERENTIAL/PLATELET
Abs Immature Granulocytes: 0.05 10*3/uL (ref 0.00–0.07)
Basophils Absolute: 0 10*3/uL (ref 0.0–0.1)
Basophils Relative: 0 %
Eosinophils Absolute: 0.1 10*3/uL (ref 0.0–0.5)
Eosinophils Relative: 1 %
HCT: 27.7 % — ABNORMAL LOW (ref 36.0–46.0)
Hemoglobin: 9.4 g/dL — ABNORMAL LOW (ref 12.0–15.0)
Immature Granulocytes: 1 %
Lymphocytes Relative: 10 %
Lymphs Abs: 0.7 10*3/uL (ref 0.7–4.0)
MCH: 29.2 pg (ref 26.0–34.0)
MCHC: 33.9 g/dL (ref 30.0–36.0)
MCV: 86 fL (ref 80.0–100.0)
Monocytes Absolute: 0.8 10*3/uL (ref 0.1–1.0)
Monocytes Relative: 11 %
Neutro Abs: 5.1 10*3/uL (ref 1.7–7.7)
Neutrophils Relative %: 77 %
Platelets: 195 10*3/uL (ref 150–400)
RBC: 3.22 MIL/uL — ABNORMAL LOW (ref 3.87–5.11)
RDW: 15.6 % — ABNORMAL HIGH (ref 11.5–15.5)
WBC: 6.7 10*3/uL (ref 4.0–10.5)
nRBC: 0 % (ref 0.0–0.2)

## 2023-04-30 LAB — BASIC METABOLIC PANEL
Anion gap: 10 (ref 5–15)
BUN: 20 mg/dL (ref 8–23)
CO2: 23 mmol/L (ref 22–32)
Calcium: 8.3 mg/dL — ABNORMAL LOW (ref 8.9–10.3)
Chloride: 105 mmol/L (ref 98–111)
Creatinine, Ser: 1.44 mg/dL — ABNORMAL HIGH (ref 0.44–1.00)
GFR, Estimated: 40 mL/min — ABNORMAL LOW (ref >60)
Glucose, Bld: 286 mg/dL — ABNORMAL HIGH (ref 70–99)
Potassium: 4.2 mmol/L (ref 3.5–5.1)
Sodium: 138 mmol/L (ref 135–145)

## 2023-04-30 LAB — GLUCOSE, CAPILLARY
Glucose-Capillary: 281 mg/dL — ABNORMAL HIGH (ref 70–99)
Glucose-Capillary: 257 mg/dL — ABNORMAL HIGH (ref 70–99)
Glucose-Capillary: 209 mg/dL — ABNORMAL HIGH (ref 70–99)
Glucose-Capillary: 214 mg/dL — ABNORMAL HIGH (ref 70–99)

## 2023-04-30 LAB — TSH: TSH: 0.933 u[IU]/mL (ref 0.350–4.500)

## 2023-04-30 LAB — T4, FREE: Free T4: 3.12 ng/dL — ABNORMAL HIGH (ref 0.61–1.12)

## 2023-04-30 LAB — PHOSPHORUS: Phosphorus: 3.3 mg/dL (ref 2.5–4.6)

## 2023-04-30 LAB — MAGNESIUM: Magnesium: 1.9 mg/dL (ref 1.7–2.4)

## 2023-04-30 MED ORDER — LACTATED RINGERS IV SOLN: INTRAVENOUS | Status: DC

## 2023-04-30 MED ORDER — ORAL CARE MOUTH RINSE
15.0000 mL | OROMUCOSAL | Status: DC
  Administered 2023-04-30 – 2023-05-20 (×76): 15 mL via OROMUCOSAL

## 2023-04-30 MED ORDER — CHLORHEXIDINE GLUCONATE CLOTH 2 % EX PADS
6.0000 | MEDICATED_PAD | Freq: Every day | CUTANEOUS | Status: DC
  Administered 2023-05-01 – 2023-05-20 (×20): 6 via TOPICAL

## 2023-04-30 MED ORDER — ORAL CARE MOUTH RINSE: 15.0000 mL | OROMUCOSAL | Status: DC | PRN

## 2023-04-30 NOTE — Plan of Care (Signed)

## 2023-04-30 NOTE — Progress Notes (Signed)
PROGRESS NOTE                                                                                                                                                                                                             Patient Demographics:    Katherine Navarro, is a 67 y.o. female, DOB - January 11, 1956, ZOX:[id number]  Outpatient Primary MD for the patient is Jarold Motto, [place]    LOS - 8  Admit date - 04/22/2023    Chief Complaint  Patient presents with   Weakness       Brief Narrative (HPI from H&P)    DM-1 supposed to be on insulin pump, hypothyroidism and HTN brought to ED with progressive sleepiness over weeks, and admitted for metabolic encephalopathy in the setting of severe hypothyroidism with elevated TSH to 44 and undetectable free T4.  She apparently was noncompliant with her Synthroid medication, head CT, EEG, ABG, ammonia level stable.  She appears to be in myxedema coma, transferred to my care on 04/24/2023.   Subjective:   Patient in bed denies any headache chest or abdominal pain, claims that she is going to sit in the chair today and eat better.   Assessment  & Plan :   Myxedema coma. Due to noncompliance with Synthroid, TSH more than 40, free T4 initially undetectable, stable head CT, EEG, ammonia and ABG, no focal deficits, placed on high-dose IV Synthroid, random cortisol over 100, TSH and free T4 improving, Synthroid dose dropped on 04/25/2024, mentation gradually improving, suspect there is some underlying psych issue as well, but denies, will continue to monitor ASH, free T4 and clinically.  AKI on CKD 3A.  Not eating well at all, it is dehydrated, counseled, hydrate and monitor, monitor bladder scans.  Hypophosphatemia, hypokalemia.  Replaced.    Thiamine deficiency.  Placed on replacement.    Urinary retention.  Foley Flomax, removal of Foley trial on 04/29/2023.  Bladder scan.  Likely undiagnosed OSA.   Nighttime oxygen with outpatient sleep study.    DM type I.  Now every 4 hours sliding scale and monitor.  Check A1c.  Will IV fluids as oral intake is pretty poor.   CBG (last 3)  Recent Labs    04/29/23 1810 04/29/23 2016 04/30/23 0844  GLUCAP 322* 238* 281*   Lab Results  Component Value Date  HGBA1C 9.9 (H) 04/24/2023         Condition - Extremely Guarded  Family Communication  :  husband [phone]  on 04/24/23, 04/26/2023, updated 04/27/2023  Code Status :  Full  Consults  :  None  PUD Prophylaxis :    Procedures  :     CT head and EEG - non acute      Disposition Plan  :    Status is: Inpatient   DVT Prophylaxis  :    enoxaparin (LOVENOX) injection 40 mg Start: 04/23/23 1000  Lab Results  Component Value Date   PLT 195 04/30/2023    Diet :  Diet Order             DIET DYS 3 Room service appropriate? Yes with Assist; Fluid consistency: Thin  Diet effective now                    Inpatient Medications  Scheduled Meds:  docusate sodium  100 mg Oral BID   enoxaparin (LOVENOX) injection  40 mg Subcutaneous Daily   insulin aspart  0-6 Units Subcutaneous TID WC   levothyroxine  200 mcg Intravenous Daily   megestrol  40 mg Oral Daily   mouth rinse  15 mL Mouth Rinse 4 times per day   tamsulosin  0.4 mg Oral Daily   [START ON 05/02/2023] thiamine (VITAMIN B1) injection  100 mg Intravenous Daily   Continuous Infusions:  lactated ringers 75 mL/hr at 04/30/23 0700   thiamine (VITAMIN B1) injection 500 mg (04/30/23 1023)    PRN Meds:.acetaminophen **OR** acetaminophen, dextrose, [DISCONTINUED] ondansetron **OR** ondansetron (ZOFRAN) IV, mouth rinse, sorbitol  Antibiotics  :    Anti-infectives (From admission, onward)    None         Objective:   Vitals:   04/29/23 1614 04/29/23 2017 04/29/23 2312 04/30/23 0354  BP:  108/71 107/64 (!) 104/58  Pulse: 89 88 87 85  Resp: 10 15 15 12   Temp:  98.1 F (36.7 C) 99 F (37.2 C) 97.9  F (36.6 C)  TempSrc:  Oral    SpO2: 100% 100% 100% 100%  Weight:      Height:        Wt Readings from Last 3 Encounters:  04/23/23 72.9 kg  03/19/21 72.9 kg  12/18/20 71.7 kg     Intake/Output Summary (Last 24 hours) at 04/30/2023 1104 Last data filed at 04/30/2023 0654 Gross per 24 hour  Intake 443.44 ml  Output 502 ml  Net -58.56 ml      Physical Exam  Awake, less sleepy, no focal deficits, following basic commands [place].AT,PERRAL Supple Neck, No JVD,   Symmetrical Chest wall movement, Good air movement bilaterally, CTAB RRR,No Gallops,Rubs or new Murmurs,  +ve B.Sounds, Abd Soft, No tenderness,   No Cyanosis, Clubbing or edema       Data Review:    Recent Labs  Lab 04/26/23 1040 04/27/23 0944 04/28/23 0433 04/29/23 0451 04/30/23 0321  WBC 6.5 6.4 5.5 5.6 6.7  HGB 9.9* 10.0* 9.4* 8.0* 9.4*  HCT 29.3* 28.7* 27.4* 24.2* 27.7*  PLT 173 176 153 166 195  MCV 83.5 83.7 84.8 85.2 86.0  MCH 28.2 29.2 29.1 28.2 29.2  MCHC 33.8 34.8 34.3 33.1 33.9  RDW 14.6 14.6 15.0 15.4 15.6*  LYMPHSABS 1.2 0.7 1.2 1.2 0.7  MONOABS 0.5 0.6 0.6 0.5 0.8  EOSABS 0.1 0.1 0.1 0.1 0.1  BASOSABS 0.0 0.0 0.0 0.0 0.0  Recent Labs  Lab 04/23/23 1712 04/23/23 1712 04/23/23 1718 04/24/23 0238 04/25/23 0447 04/26/23 1040 04/27/23 0944 04/28/23 0433 04/29/23 0451 04/30/23 0321  NA 136  --   --  137 138 140 135 139 139 138  K 3.4*  --   --  3.7 3.5 3.2* 3.1* 2.9* 3.6 4.2  CL 95*  --   --  98 103 103 100 103 104 105  CO2 27  --   --  26 25 25 25 28 27 23   ANIONGAP 14  --   --  13 10 12 10 8 8 10   GLUCOSE 81  --   --  368* 130* 55* 336* 147* 251* 286*  BUN 30*  --   --  25* 17 16 15 10 14 20   CREATININE 1.24*  --   --  1.17* 0.97 0.79 1.03* 0.69 0.97 1.44*  AST 31  --   --  24  --   --   --   --   --   --   ALT 23  --   --  20  --   --   --   --   --   --   ALKPHOS 62  --   --  57  --   --   --   --   --   --   BILITOT 1.1  --   --  0.9  --   --   --   --   --   --   ALBUMIN  3.6  --   --  3.0*  --   --   --   --   --   --   TSH  --   --   --   --   --  10.652*  --   --   --  0.933  HGBA1C  --   --   --  9.9*  --   --   --   --   --   --   AMMONIA  --   --  23  --   --   --   --   --   --   --   BNP  --   --   --   --  460.8* 382.4* 171.2* 168.5*  --   --   MG  --    < > 3.7* 2.5* 2.1 1.9 2.1 2.0 1.7 1.9  CALCIUM 11.5*  --   --  10.1 9.6 8.9 8.7* 8.5* 7.7* 8.3*   < > = values in this interval not displayed.      Recent Labs  Lab 04/23/23 1718 04/24/23 0238 04/25/23 0447 04/26/23 1040 04/27/23 0944 04/28/23 0433 04/29/23 0451 04/30/23 0321  TSH  --   --   --  10.652*  --   --   --  0.933  HGBA1C  --  9.9*  --   --   --   --   --   --   AMMONIA 23  --   --   --   --   --   --   --   BNP  --   --  460.8* 382.4* 171.2* 168.5*  --   --   MG 3.7* 2.5* 2.1 1.9 2.1 2.0 1.7 1.9  CALCIUM  --  10.1 9.6 8.9 8.7* 8.5* 7.7* 8.3*    --------------------------------------------------------------------------------------------------------------- Lab Results  Component Value Date   CHOL 240 (H) 09/02/2017  HDL 69.90 09/02/2017   LDLCALC 149 (H) 09/02/2017   LDLDIRECT 210.4 05/14/2013   TRIG 106.0 09/02/2017   CHOLHDL 3 09/02/2017    Lab Results  Component Value Date   HGBA1C 9.9 (H) 04/24/2023   Recent Labs    04/30/23 0321  TSH 0.933  FREET4 3.12*    No results for input(s): "VITAMINB12", "FOLATE", "FERRITIN", "TIBC", "IRON", "RETICCTPCT" in the last 72 hours.  ------------------------------------------------------------------------------------------------------------------ Cardiac Enzymes No results for input(s): "CKMB", "TROPONINI", "MYOGLOBIN" in the last 168 hours.  Invalid input(s): "CK"  Micro Results Recent Results (from the past 240 hour(s))  Urine Culture     Status: None   Collection Time: 04/22/23  4:29 PM   Specimen: Urine, Clean Catch  Result Value Ref Range Status   Specimen Description URINE, CLEAN CATCH  Final   Special  Requests NONE  Final   Culture   Final    NO GROWTH Performed at [facility], [address] [place], [place] [address]    Report Status 04/24/2023 FINAL  Final  Culture, blood (single) w Reflex to ID Panel     Status: None   Collection Time: 04/23/23  7:38 AM   Specimen: BLOOD RIGHT ARM  Result Value Ref Range Status   Specimen Description BLOOD RIGHT ARM  Final   Special Requests   Final    BOTTLES DRAWN AEROBIC AND ANAEROBIC Blood Culture adequate volume   Culture   Final    NO GROWTH 5 DAYS Performed at [facility], [address] [place], [place] [address]    Report Status 04/28/2023 FINAL  Final    Radiology Reports No results found.    Signature  -   Susa Raring M.D on 04/30/2023 at 11:04 AM   -  To page go to [email or web address]

## 2023-05-01 DIAGNOSIS — E039 Hypothyroidism, unspecified: Secondary | ICD-10-CM | POA: Diagnosis not present

## 2023-05-01 LAB — CBC WITH DIFFERENTIAL/PLATELET
Abs Immature Granulocytes: 0.03 10*3/uL (ref 0.00–0.07)
Basophils Absolute: 0 10*3/uL (ref 0.0–0.1)
Basophils Relative: 0 %
Eosinophils Absolute: 0.1 10*3/uL (ref 0.0–0.5)
Eosinophils Relative: 2 %
HCT: 22.3 % — ABNORMAL LOW (ref 36.0–46.0)
Hemoglobin: 7.6 g/dL — ABNORMAL LOW (ref 12.0–15.0)
Immature Granulocytes: 1 %
Lymphocytes Relative: 11 %
Lymphs Abs: 0.7 10*3/uL (ref 0.7–4.0)
MCH: 29.2 pg (ref 26.0–34.0)
MCHC: 34.1 g/dL (ref 30.0–36.0)
MCV: 85.8 fL (ref 80.0–100.0)
Monocytes Absolute: 0.7 10*3/uL (ref 0.1–1.0)
Monocytes Relative: 11 %
Neutro Abs: 4.7 10*3/uL (ref 1.7–7.7)
Neutrophils Relative %: 75 %
Platelets: 206 10*3/uL (ref 150–400)
RBC: 2.6 MIL/uL — ABNORMAL LOW (ref 3.87–5.11)
RDW: 16 % — ABNORMAL HIGH (ref 11.5–15.5)
WBC: 6.2 10*3/uL (ref 4.0–10.5)
nRBC: 0 % (ref 0.0–0.2)

## 2023-05-01 LAB — GLUCOSE, CAPILLARY
Glucose-Capillary: 187 mg/dL — ABNORMAL HIGH (ref 70–99)
Glucose-Capillary: 211 mg/dL — ABNORMAL HIGH (ref 70–99)
Glucose-Capillary: 279 mg/dL — ABNORMAL HIGH (ref 70–99)
Glucose-Capillary: 280 mg/dL — ABNORMAL HIGH (ref 70–99)

## 2023-05-01 LAB — RETICULOCYTES
Immature Retic Fract: 15 % (ref 2.3–15.9)
RBC.: 2.5 MIL/uL — ABNORMAL LOW (ref 3.87–5.11)
Retic Count, Absolute: 30.5 10*3/uL (ref 19.0–186.0)
Retic Ct Pct: 1.2 % (ref 0.4–3.1)

## 2023-05-01 LAB — BASIC METABOLIC PANEL
Anion gap: 12 (ref 5–15)
BUN: 21 mg/dL (ref 8–23)
CO2: 22 mmol/L (ref 22–32)
Calcium: 8.4 mg/dL — ABNORMAL LOW (ref 8.9–10.3)
Chloride: 107 mmol/L (ref 98–111)
Creatinine, Ser: 1.24 mg/dL — ABNORMAL HIGH (ref 0.44–1.00)
GFR, Estimated: 48 mL/min — ABNORMAL LOW (ref >60)
Glucose, Bld: 236 mg/dL — ABNORMAL HIGH (ref 70–99)
Potassium: 3.7 mmol/L (ref 3.5–5.1)
Sodium: 141 mmol/L (ref 135–145)

## 2023-05-01 LAB — IRON AND TIBC
Iron: 35 ug/dL (ref 28–170)
Saturation Ratios: 21 % (ref 10.4–31.8)
TIBC: 165 ug/dL — ABNORMAL LOW (ref 250–450)
UIBC: 130 ug/dL

## 2023-05-01 LAB — FOLATE: Folate: 2.4 ng/mL — ABNORMAL LOW (ref >5.9)

## 2023-05-01 LAB — FERRITIN: Ferritin: 474 ng/mL — ABNORMAL HIGH (ref 11–307)

## 2023-05-01 LAB — MAGNESIUM: Magnesium: 1.7 mg/dL (ref 1.7–2.4)

## 2023-05-01 LAB — VITAMIN B12: Vitamin B-12: 762 pg/mL (ref 180–914)

## 2023-05-01 LAB — PHOSPHORUS: Phosphorus: 1.9 mg/dL — ABNORMAL LOW (ref 2.5–4.6)

## 2023-05-01 MED ORDER — POLYETHYLENE GLYCOL 3350 17 G PO PACK
17.0000 g | PACK | Freq: Two times a day (BID) | ORAL | Status: DC
Start: 2023-05-01 — End: 2023-05-08
  Administered 2023-05-01 – 2023-05-08 (×10): 17 g via ORAL
  Filled 2023-05-01 (×13): qty 1

## 2023-05-01 MED ORDER — POTASSIUM CHLORIDE CRYS ER 20 MEQ PO TBCR
40.0000 meq | EXTENDED_RELEASE_TABLET | Freq: Once | ORAL | Status: AC
  Administered 2023-05-01: 40 meq via ORAL
  Filled 2023-05-01: qty 2

## 2023-05-01 MED ORDER — PROSOURCE PLUS PO LIQD
30.0000 mL | Freq: Two times a day (BID) | ORAL | Status: DC
  Administered 2023-05-01 – 2023-05-16 (×24): 30 mL via ORAL
  Filled 2023-05-01 (×21): qty 30

## 2023-05-01 MED ORDER — MAGNESIUM HYDROXIDE 400 MG/5ML PO SUSP
30.0000 mL | Freq: Two times a day (BID) | ORAL | Status: AC
  Administered 2023-05-01 (×2): 30 mL via ORAL
  Filled 2023-05-01 (×2): qty 30

## 2023-05-01 MED ORDER — SODIUM PHOSPHATES 45 MMOLE/15ML IV SOLN
30.0000 mmol | Freq: Once | INTRAVENOUS | Status: AC
  Administered 2023-05-01: 30 mmol via INTRAVENOUS
  Filled 2023-05-01: qty 10

## 2023-05-01 MED ORDER — LACTATED RINGERS IV SOLN: INTRAVENOUS | Status: AC

## 2023-05-01 MED ORDER — MAGNESIUM SULFATE 2 GM/50ML IV SOLN
2.0000 g | Freq: Once | INTRAVENOUS | Status: AC
  Administered 2023-05-01: 2 g via INTRAVENOUS
  Filled 2023-05-01: qty 50

## 2023-05-01 MED ORDER — FOLIC ACID 1 MG PO TABS
1.0000 mg | ORAL_TABLET | Freq: Every day | ORAL | Status: DC
  Administered 2023-05-01 – 2023-05-08 (×8): 1 mg via ORAL
  Filled 2023-05-01 (×8): qty 1

## 2023-05-01 MED ORDER — BOOST PLUS PO LIQD
237.0000 mL | Freq: Three times a day (TID) | ORAL | Status: DC
  Administered 2023-05-01 – 2023-05-20 (×29): 237 mL via ORAL
  Filled 2023-05-01 (×62): qty 237

## 2023-05-01 NOTE — Plan of Care (Signed)

## 2023-05-01 NOTE — Progress Notes (Signed)
PROGRESS NOTE                                                                                                                                                                                                             Patient Demographics:    Katherine Navarro, is a 67 y.o. female, DOB - [birth year], YQM:[id number]  Outpatient Primary MD for the patient is Jarold Motto, [place]    LOS - 9  Admit date - 04/22/2023    Chief Complaint  Patient presents with   Weakness       Brief Narrative (HPI from H&P)    DM-1 supposed to be on insulin pump, hypothyroidism and HTN brought to ED with progressive sleepiness over weeks, and admitted for metabolic encephalopathy in the setting of severe hypothyroidism with elevated TSH to 44 and undetectable free T4.  She apparently was noncompliant with her Synthroid medication, head CT, EEG, ABG, ammonia level stable.  She appears to be in myxedema coma, transferred to my care on 04/24/2023.   Subjective:   Patient in bed, appears comfortable, denies any headache, no fever, no chest pain or pressure, no shortness of breath , no abdominal pain. No focal weakness.   Assessment  & Plan :   Myxedema coma. Due to noncompliance with Synthroid, TSH more than 40, free T4 initially undetectable, stable head CT, EEG, ammonia and ABG, no focal deficits, placed on high-dose IV Synthroid, random cortisol over 100, TSH and free T4 improving, Synthroid dose dropped on 04/25/2024, mentation gradually improving, suspect there is some underlying psych issue as well, but denies, will continue to monitor ASH, free T4 and clinically.  AKI on CKD 3A.  Not eating well at all, it is dehydrated, counseled, hydrate and monitor, monitor bladder scans.  Hypophosphatemia, hypokalemia.  Replaced.    Folic acid and thiamine deficiency.  Placed on replacement.    Urinary retention.  Foley Flomax, failed Foley removal, also has  some constipation, bowel regimen initiated.  Monitor.  Likely undiagnosed OSA.  Nighttime oxygen with outpatient sleep study.    DM type I.  Now every 4 hours sliding scale and monitor.  Check A1c.  Will IV fluids as oral intake is pretty poor.   CBG (last 3)  Recent Labs    04/30/23 2033 05/01/23 0025 05/01/23 0748  GLUCAP 214* 211* 279*  Lab Results  Component Value Date   HGBA1C 9.9 (H) 04/24/2023         Condition - Extremely Guarded  Family Communication  :  husband [phone]  on 04/24/23, 04/26/2023, updated 04/27/2023  Code Status :  Full  Consults  :  None  PUD Prophylaxis :    Procedures  :     CT head and EEG - non acute      Disposition Plan  :    Status is: Inpatient   DVT Prophylaxis  :    enoxaparin (LOVENOX) injection 40 mg Start: 04/23/23 1000  Lab Results  Component Value Date   PLT 206 05/01/2023    Diet :  Diet Order             DIET DYS 3 Room service appropriate? Yes with Assist; Fluid consistency: Thin  Diet effective now                    Inpatient Medications  Scheduled Meds:  Chlorhexidine Gluconate Cloth  6 each Topical Q0600   docusate sodium  100 mg Oral BID   enoxaparin (LOVENOX) injection  40 mg Subcutaneous Daily   folic acid  1 mg Oral Daily   insulin aspart  0-6 Units Subcutaneous TID WC   levothyroxine  200 mcg Intravenous Daily   magnesium hydroxide  30 mL Oral BID   megestrol  40 mg Oral Daily   mouth rinse  15 mL Mouth Rinse 4 times per day   polyethylene glycol  17 g Oral BID   tamsulosin  0.4 mg Oral Daily   [START ON 05/02/2023] thiamine (VITAMIN B1) injection  100 mg Intravenous Daily   Continuous Infusions:  sodium phosphate 30 mmol in dextrose 5 % 250 mL infusion 30 mmol (05/01/23 0746)    PRN Meds:.acetaminophen **OR** acetaminophen, dextrose, [DISCONTINUED] ondansetron **OR** ondansetron (ZOFRAN) IV, mouth rinse, sorbitol  Antibiotics  :    Anti-infectives (From admission, onward)     None         Objective:   Vitals:   04/30/23 2034 05/01/23 0026 05/01/23 0340 05/01/23 0747  BP: (!) 96/57 117/71 123/70 132/67  Pulse: 98 98 99 94  Resp: 14 13 12 17   Temp: 98.2 F (36.8 C) 98.2 F (36.8 C) 98.3 F (36.8 C) 97.9 F (36.6 C)  TempSrc: Oral Oral Oral Oral  SpO2: 100% 99% 100%   Weight:      Height:        Wt Readings from Last 3 Encounters:  04/23/23 72.9 kg  03/19/21 72.9 kg  12/18/20 71.7 kg     Intake/Output Summary (Last 24 hours) at 05/01/2023 0920 Last data filed at 05/01/2023 1610 Gross per 24 hour  Intake 1567.33 ml  Output 1350 ml  Net 217.33 ml      Physical Exam  Awake, less sleepy, no focal deficits, following basic commands Foley catheter in place [place].AT,PERRAL Supple Neck, No JVD,   Symmetrical Chest wall movement, Good air movement bilaterally, CTAB RRR,No Gallops,Rubs or new Murmurs,  +ve B.Sounds, Abd Soft, No tenderness,   No Cyanosis, Clubbing or edema       Data Review:    Recent Labs  Lab 04/27/23 0944 04/28/23 0433 04/29/23 0451 04/30/23 0321 05/01/23 0305  WBC 6.4 5.5 5.6 6.7 6.2  HGB 10.0* 9.4* 8.0* 9.4* 7.6*  HCT 28.7* 27.4* 24.2* 27.7* 22.3*  PLT 176 153 166 195 206  MCV 83.7 84.8 85.2 86.0 85.8  MCH  29.2 29.1 28.2 29.2 29.2  MCHC 34.8 34.3 33.1 33.9 34.1  RDW 14.6 15.0 15.4 15.6* 16.0*  LYMPHSABS 0.7 1.2 1.2 0.7 0.7  MONOABS 0.6 0.6 0.5 0.8 0.7  EOSABS 0.1 0.1 0.1 0.1 0.1  BASOSABS 0.0 0.0 0.0 0.0 0.0    Recent Labs  Lab 04/25/23 0447 04/26/23 1040 04/27/23 0944 04/28/23 0433 04/29/23 0451 04/30/23 0321 05/01/23 0305  NA 138 140 135 139 139 138 141  K 3.5 3.2* 3.1* 2.9* 3.6 4.2 3.7  CL 103 103 100 103 104 105 107  CO2 25 25 25 28 27 23 22   ANIONGAP 10 12 10 8 8 10 12   GLUCOSE 130* 55* 336* 147* 251* 286* 236*  BUN 17 16 15 10 14 20 21   CREATININE 0.97 0.79 1.03* 0.69 0.97 1.44* 1.24*  TSH  --  10.652*  --   --   --  0.933  --   BNP 460.8* 382.4* 171.2* 168.5*  --   --   --    MG 2.1 1.9 2.1 2.0 1.7 1.9 1.7  CALCIUM 9.6 8.9 8.7* 8.5* 7.7* 8.3* 8.4*      Recent Labs  Lab 04/25/23 0447 04/26/23 1040 04/27/23 0944 04/28/23 0433 04/29/23 0451 04/30/23 0321 05/01/23 0305  TSH  --  10.652*  --   --   --  0.933  --   BNP 460.8* 382.4* 171.2* 168.5*  --   --   --   MG 2.1 1.9 2.1 2.0 1.7 1.9 1.7  CALCIUM 9.6 8.9 8.7* 8.5* 7.7* 8.3* 8.4*    --------------------------------------------------------------------------------------------------------------- Lab Results  Component Value Date   CHOL 240 (H) 09/02/2017   HDL 69.90 09/02/2017   LDLCALC 149 (H) 09/02/2017   LDLDIRECT 210.4 05/14/2013   TRIG 106.0 09/02/2017   CHOLHDL 3 09/02/2017    Lab Results  Component Value Date   HGBA1C 9.9 (H) 04/24/2023   Recent Labs    04/30/23 0321  TSH 0.933  FREET4 3.12*    Recent Labs    05/01/23 0724  VITAMINB12 762  FOLATE 2.4*  FERRITIN 474*  TIBC 165*  IRON 35  RETICCTPCT 1.2    ------------------------------------------------------------------------------------------------------------------ Cardiac Enzymes No results for input(s): "CKMB", "TROPONINI", "MYOGLOBIN" in the last 168 hours.  Invalid input(s): "CK"  Micro Results Recent Results (from the past 240 hour(s))  Urine Culture     Status: None   Collection Time: 04/22/23  4:29 PM   Specimen: Urine, Clean Catch  Result Value Ref Range Status   Specimen Description URINE, CLEAN CATCH  Final   Special Requests NONE  Final   Culture   Final    NO GROWTH Performed at [facility], [address] [place], [place] [address]    Report Status 04/24/2023 FINAL  Final  Culture, blood (single) w Reflex to ID Panel     Status: None   Collection Time: 04/23/23  7:38 AM   Specimen: BLOOD RIGHT ARM  Result Value Ref Range Status   Specimen Description BLOOD RIGHT ARM  Final   Special Requests   Final    BOTTLES DRAWN AEROBIC AND ANAEROBIC Blood Culture adequate volume   Culture    Final    NO GROWTH 5 DAYS Performed at [facility], [address], [place], [place] [address]    Report Status 04/28/2023 FINAL  Final    Radiology Reports US RENAL  Result Date: 04/30/2023 CLINICAL DATA:  Acute kidney injury. EXAM: RENAL / URINARY TRACT ULTRASOUND  COMPLETE COMPARISON:  None Available. FINDINGS: Right Kidney: Renal measurements: 9.3 x 4.9 x 4.6 cm = volume: 109 mL. Increased echogenicity of renal parenchyma is noted. No mass or hydronephrosis visualized. Left Kidney: Renal measurements: 9.6 x 5.5 x 6.6 cm = volume: 181 mL. Increased echogenicity of renal parenchyma is noted. No mass or hydronephrosis visualized. Bladder: Moderate amount of mildly echogenic material is noted dependently in urinary bladder suggesting debris. Other: None. IMPRESSION: Increased echogenicity of renal parenchyma is noted bilaterally suggesting medical renal disease. No hydronephrosis or renal obstruction is noted. Moderate amount of mildly echogenic material noted dependently and urinary bladder concerning for debris, potentially inflammatory in etiology. Electronically Signed   By: Lupita Raider M.D.   On: 04/30/2023 11:41      Signature  -   Susa Raring M.D on 05/01/2023 at 9:20 AM   -  To page go to [email or web address]

## 2023-05-01 NOTE — Plan of Care (Signed)
  Problem: Education: Goal: Ability to describe self-care measures that may prevent or decrease complications (Diabetes Survival Skills Education) will improve Outcome: Progressing Goal: Individualized Educational Video(s) Outcome: Progressing   

## 2023-05-02 ENCOUNTER — Inpatient Hospital Stay (HOSPITAL_COMMUNITY): Payer: Medicare HMO

## 2023-05-02 DIAGNOSIS — M7989 Other specified soft tissue disorders: Secondary | ICD-10-CM | POA: Diagnosis not present

## 2023-05-02 DIAGNOSIS — E039 Hypothyroidism, unspecified: Secondary | ICD-10-CM | POA: Diagnosis not present

## 2023-05-02 LAB — CBC WITH DIFFERENTIAL/PLATELET
Abs Immature Granulocytes: 0.03 10*3/uL (ref 0.00–0.07)
Basophils Absolute: 0 10*3/uL (ref 0.0–0.1)
Basophils Relative: 0 %
Eosinophils Absolute: 0.1 10*3/uL (ref 0.0–0.5)
Eosinophils Relative: 1 %
HCT: 20.9 % — ABNORMAL LOW (ref 36.0–46.0)
Hemoglobin: 7 g/dL — ABNORMAL LOW (ref 12.0–15.0)
Immature Granulocytes: 1 %
Lymphocytes Relative: 10 %
Lymphs Abs: 0.5 10*3/uL — ABNORMAL LOW (ref 0.7–4.0)
MCH: 28.9 pg (ref 26.0–34.0)
MCHC: 33.5 g/dL (ref 30.0–36.0)
MCV: 86.4 fL (ref 80.0–100.0)
Monocytes Absolute: 0.4 10*3/uL (ref 0.1–1.0)
Monocytes Relative: 7 %
Neutro Abs: 4.4 10*3/uL (ref 1.7–7.7)
Neutrophils Relative %: 81 %
Platelets: 204 10*3/uL (ref 150–400)
RBC: 2.42 MIL/uL — ABNORMAL LOW (ref 3.87–5.11)
RDW: 16 % — ABNORMAL HIGH (ref 11.5–15.5)
WBC: 5.4 10*3/uL (ref 4.0–10.5)
nRBC: 0 % (ref 0.0–0.2)

## 2023-05-02 LAB — GLUCOSE, CAPILLARY
Glucose-Capillary: 397 mg/dL — ABNORMAL HIGH (ref 70–99)
Glucose-Capillary: 329 mg/dL — ABNORMAL HIGH (ref 70–99)
Glucose-Capillary: 230 mg/dL — ABNORMAL HIGH (ref 70–99)
Glucose-Capillary: 180 mg/dL — ABNORMAL HIGH (ref 70–99)

## 2023-05-02 LAB — BASIC METABOLIC PANEL
Anion gap: 13 (ref 5–15)
BUN: 20 mg/dL (ref 8–23)
CO2: 19 mmol/L — ABNORMAL LOW (ref 22–32)
Calcium: 8.2 mg/dL — ABNORMAL LOW (ref 8.9–10.3)
Chloride: 108 mmol/L (ref 98–111)
Creatinine, Ser: 1.32 mg/dL — ABNORMAL HIGH (ref 0.44–1.00)
GFR, Estimated: 45 mL/min — ABNORMAL LOW (ref >60)
Glucose, Bld: 360 mg/dL — ABNORMAL HIGH (ref 70–99)
Potassium: 4.1 mmol/L (ref 3.5–5.1)
Sodium: 140 mmol/L (ref 135–145)

## 2023-05-02 LAB — PHOSPHORUS: Phosphorus: 2.5 mg/dL (ref 2.5–4.6)

## 2023-05-02 LAB — MAGNESIUM: Magnesium: 2.1 mg/dL (ref 1.7–2.4)

## 2023-05-02 LAB — PREPARE RBC (CROSSMATCH)

## 2023-05-02 MED ORDER — INSULIN GLARGINE-YFGN 100 UNIT/ML ~~LOC~~ SOLN
10.0000 [IU] | Freq: Every day | SUBCUTANEOUS | Status: DC
  Administered 2023-05-02 – 2023-05-03 (×2): 10 [IU] via SUBCUTANEOUS
  Filled 2023-05-02 (×3): qty 0.1

## 2023-05-02 MED ORDER — PANTOPRAZOLE SODIUM 40 MG PO TBEC
40.0000 mg | DELAYED_RELEASE_TABLET | Freq: Every day | ORAL | Status: DC
  Administered 2023-05-02 – 2023-05-09 (×8): 40 mg via ORAL
  Filled 2023-05-02 (×9): qty 1

## 2023-05-02 MED ORDER — FUROSEMIDE 10 MG/ML IJ SOLN
20.0000 mg | Freq: Once | INTRAMUSCULAR | Status: AC
  Administered 2023-05-02: 20 mg via INTRAVENOUS
  Filled 2023-05-02: qty 2

## 2023-05-02 MED ORDER — SODIUM CHLORIDE 0.9% IV SOLUTION: Freq: Once | INTRAVENOUS | Status: AC

## 2023-05-02 MED ORDER — LACTATED RINGERS IV SOLN: INTRAVENOUS | Status: DC

## 2023-05-02 NOTE — Progress Notes (Addendum)
PROGRESS NOTE                                                                                                                                                                                                             Patient Demographics:    Katherine Navarro, is a 67 y.o. female, DOB - August 12, 1955, QMV:[id number]  Outpatient Primary MD for the patient is Jarold Motto, [place]    LOS - 10  Admit date - 04/22/2023    Chief Complaint  Patient presents with   Weakness       Brief Narrative (HPI from H&P)    DM-1 supposed to be on insulin pump, hypothyroidism and HTN brought to ED with progressive sleepiness over weeks, and admitted for metabolic encephalopathy in the setting of severe hypothyroidism with elevated TSH to 44 and undetectable free T4.  She apparently was noncompliant with her Synthroid medication, head CT, EEG, ABG, ammonia level stable.  She appears to be in myxedema coma, transferred to my care on 04/24/2023.   Subjective:   Patient in bed, appears comfortable, denies any headache, no fever, no chest pain or pressure, no shortness of breath , no abdominal pain. No new focal weakness.   Assessment  & Plan :   Myxedema coma. Due to noncompliance with Synthroid, TSH more than 40, free T4 initially undetectable, stable head CT, EEG, ammonia and ABG, no focal deficits, placed on high-dose IV Synthroid, random cortisol over 100, TSH and free T4 improving, Synthroid dose dropped on 04/25/2024, mentation gradually improving, suspect there is some underlying psych issue as well, but denies, will continue to monitor TSH, free T4 and clinically.  Megace added for better appetite.  AKI on CKD 3A.  Not eating well at all, it is dehydrated, counseled, hydrate and monitor, foley.  Hypophosphatemia, hypokalemia.  Replaced.    Poor Oral intake due to #1 above.  Megace supplementation added.  Patient encouraged.  Monitor.    Chronic  anemia with evidence of acute worsening due to heme dilution.  No signs of GI bleeding, replace folic acid and thiamine as they were deficient, placed on PPI transfuse 1 unit on 05/02/2023.  Outpatient anemia workup.  Urinary retention.  Foley Flomax, failed Foley removal, also has some constipation, bowel regimen initiated.  Monitor.  Right upper arm hematoma and swelling.  Check venous ultrasound,  remove IV on that arm, check venous ultrasound.    Likely undiagnosed OSA.  Nighttime oxygen with outpatient sleep study.    DM type I.  Now every 4 hours sliding scale and monitor.  Check A1c.  Will IV fluids as oral intake is pretty poor.   CBG (last 3)  Recent Labs    05/01/23 1157 05/01/23 1619 05/02/23 0806  GLUCAP 280* 187* 397*   Lab Results  Component Value Date   HGBA1C 9.9 (H) 04/24/2023         Condition - Extremely Guarded  Family Communication  :  husband [phone]  on 04/24/23, 04/26/2023, updated 04/27/2023, 07/01/2022  Code Status :  Full  Consults  :  None  PUD Prophylaxis :    Procedures  :     CT head and EEG - non acute      Disposition Plan  :    Status is: Inpatient   DVT Prophylaxis  :    enoxaparin (LOVENOX) injection 40 mg Start: 04/23/23 1000  Lab Results  Component Value Date   PLT 204 05/02/2023    Diet :  Diet Order             DIET DYS 3 Room service appropriate? Yes with Assist; Fluid consistency: Thin  Diet effective now                    Inpatient Medications  Scheduled Meds:  (feeding supplement) PROSource Plus  30 mL Oral BID BM   Chlorhexidine Gluconate Cloth  6 each Topical Q0600   docusate sodium  100 mg Oral BID   enoxaparin (LOVENOX) injection  40 mg Subcutaneous Daily   folic acid  1 mg Oral Daily   insulin aspart  0-6 Units Subcutaneous TID WC   insulin glargine-yfgn  10 Units Subcutaneous Daily   lactose free nutrition  237 mL Oral TID WC   levothyroxine  200 mcg Intravenous Daily   megestrol  40 mg  Oral Daily   mouth rinse  15 mL Mouth Rinse 4 times per day   polyethylene glycol  17 g Oral BID   tamsulosin  0.4 mg Oral Daily   thiamine (VITAMIN B1) injection  100 mg Intravenous Daily   Continuous Infusions:  lactated ringers 75 mL/hr at 05/02/23 0602    PRN Meds:.acetaminophen **OR** acetaminophen, dextrose, [DISCONTINUED] ondansetron **OR** ondansetron (ZOFRAN) IV, mouth rinse, sorbitol  Antibiotics  :    Anti-infectives (From admission, onward)    None         Objective:   Vitals:   05/02/23 0035 05/02/23 0328 05/02/23 0400 05/02/23 0800  BP:  101/68 (!) 94/54 (!) 101/57  Pulse: (!) 104 (!) 102 91 98  Resp: 14 14 18 17   Temp:  97.9 F (36.6 C)  97.8 F (36.6 C)  TempSrc:    Oral  SpO2: 99% 95% 94% 96%  Weight:      Height:        Wt Readings from Last 3 Encounters:  04/23/23 72.9 kg  03/19/21 72.9 kg  12/18/20 71.7 kg     Intake/Output Summary (Last 24 hours) at 05/02/2023 1053 Last data filed at 05/02/2023 0558 Gross per 24 hour  Intake 240 ml  Output 1450 ml  Net -1210 ml      Physical Exam  Awake, less sleepy, no focal deficits, following basic commands Foley catheter in place Ten Sleep.AT,PERRAL Supple Neck, No JVD,   Symmetrical Chest wall movement, Good air movement  bilaterally, CTAB RRR,No Gallops,Rubs or new Murmurs,  +ve B.Sounds, Abd Soft, No tenderness,   Right upper arm has hematoma and swelling,      Data Review:    Recent Labs  Lab 04/28/23 0433 04/29/23 0451 04/30/23 0321 05/01/23 0305 05/02/23 0426  WBC 5.5 5.6 6.7 6.2 5.4  HGB 9.4* 8.0* 9.4* 7.6* 7.0*  HCT 27.4* 24.2* 27.7* 22.3* 20.9*  PLT 153 166 195 206 204  MCV 84.8 85.2 86.0 85.8 86.4  MCH 29.1 28.2 29.2 29.2 28.9  MCHC 34.3 33.1 33.9 34.1 33.5  RDW 15.0 15.4 15.6* 16.0* 16.0*  LYMPHSABS 1.2 1.2 0.7 0.7 0.5*  MONOABS 0.6 0.5 0.8 0.7 0.4  EOSABS 0.1 0.1 0.1 0.1 0.1  BASOSABS 0.0 0.0 0.0 0.0 0.0    Recent Labs  Lab 04/26/23 1040 04/27/23 0944  04/28/23 0433 04/29/23 0451 04/30/23 0321 05/01/23 0305 05/02/23 0426  NA 140 135 139 139 138 141 140  K 3.2* 3.1* 2.9* 3.6 4.2 3.7 4.1  CL 103 100 103 104 105 107 108  CO2 25 25 28 27 23 22  19*  ANIONGAP 12 10 8 8 10 12 13   GLUCOSE 55* 336* 147* 251* 286* 236* 360*  BUN 16 15 10 14 20 21 20   CREATININE 0.79 1.03* 0.69 0.97 1.44* 1.24* 1.32*  TSH 10.652*  --   --   --  0.933  --   --   BNP 382.4* 171.2* 168.5*  --   --   --   --   MG 1.9 2.1 2.0 1.7 1.9 1.7 2.1  CALCIUM 8.9 8.7* 8.5* 7.7* 8.3* 8.4* 8.2*      Recent Labs  Lab 04/26/23 1040 04/27/23 0944 04/28/23 0433 04/29/23 0451 04/30/23 0321 05/01/23 0305 05/02/23 0426  TSH 10.652*  --   --   --  0.933  --   --   BNP 382.4* 171.2* 168.5*  --   --   --   --   MG 1.9 2.1 2.0 1.7 1.9 1.7 2.1  CALCIUM 8.9 8.7* 8.5* 7.7* 8.3* 8.4* 8.2*    --------------------------------------------------------------------------------------------------------------- Lab Results  Component Value Date   CHOL 240 (H) 09/02/2017   HDL 69.90 09/02/2017   LDLCALC 149 (H) 09/02/2017   LDLDIRECT 210.4 05/14/2013   TRIG 106.0 09/02/2017   CHOLHDL 3 09/02/2017    Lab Results  Component Value Date   HGBA1C 9.9 (H) 04/24/2023   Recent Labs    04/30/23 0321  TSH 0.933  FREET4 3.12*    Recent Labs    05/01/23 0724  VITAMINB12 762  FOLATE 2.4*  FERRITIN 474*  TIBC 165*  IRON 35  RETICCTPCT 1.2    ------------------------------------------------------------------------------------------------------------------ Cardiac Enzymes No results for input(s): "CKMB", "TROPONINI", "MYOGLOBIN" in the last 168 hours.  Invalid input(s): "CK"  Micro Results Recent Results (from the past 240 hour(s))  Urine Culture     Status: None   Collection Time: 04/22/23  4:29 PM   Specimen: Urine, Clean Catch  Result Value Ref Range Status   Specimen Description URINE, CLEAN CATCH  Final   Special Requests NONE  Final   Culture   Final    NO  GROWTH Performed at [facility], [address], [place], [place] [address]    Report Status 04/24/2023 FINAL  Final  Culture, blood (single) w Reflex to ID Panel     Status: None   Collection Time: 04/23/23  7:38 AM   Specimen: BLOOD RIGHT ARM  Result Value Ref  Range Status   Specimen Description BLOOD RIGHT ARM  Final   Special Requests   Final    BOTTLES DRAWN AEROBIC AND ANAEROBIC Blood Culture adequate volume   Culture   Final    NO GROWTH 5 DAYS Performed at [facility], [address], [place], [place] [address]    Report Status 04/28/2023 FINAL  Final    Radiology Reports US RENAL  Result Date: 04/30/2023 CLINICAL DATA:  Acute kidney injury. EXAM: RENAL / URINARY TRACT ULTRASOUND COMPLETE COMPARISON:  None Available. FINDINGS: Right Kidney: Renal measurements: 9.3 x 4.9 x 4.6 cm = volume: 109 mL. Increased echogenicity of renal parenchyma is noted. No mass or hydronephrosis visualized. Left Kidney: Renal measurements: 9.6 x 5.5 x 6.6 cm = volume: 181 mL. Increased echogenicity of renal parenchyma is noted. No mass or hydronephrosis visualized. Bladder: Moderate amount of mildly echogenic material is noted dependently in urinary bladder suggesting debris. Other: None. IMPRESSION: Increased echogenicity of renal parenchyma is noted bilaterally suggesting medical renal disease. No hydronephrosis or renal obstruction is noted. Moderate amount of mildly echogenic material noted dependently and urinary bladder concerning for debris, potentially inflammatory in etiology. Electronically Signed   By: Lupita Raider M.D.   On: 04/30/2023 11:41      Signature  -   Susa Raring M.D on 05/02/2023 at 10:53 AM   -  To page go to [email or web address]

## 2023-05-02 NOTE — Plan of Care (Signed)

## 2023-05-02 NOTE — Plan of Care (Signed)
  Problem: Skin Integrity: Goal: Risk for impaired skin integrity will decrease Outcome: Progressing   Problem: Respiratory: Goal: Will regain and/or maintain adequate ventilation Outcome: Progressing   Problem: Clinical Measurements: Goal: Will remain free from infection Outcome: Progressing

## 2023-05-02 NOTE — TOC Progression Note (Signed)
Transition of Care [facility]) - Progression Note    Patient Details  Name: Katherine Navarro MRN: [id number] Date of Birth: 08/07/1955  Transition of Care [facility]) CM/SW Contact  Mearl Latin, LCSW Phone Number: 05/02/2023, 11:50 AM  Clinical Narrative:    11:51 AM-CSW spoke with patient's spouse to ask about SNF choice. Spouse stated he is still uncertain but is considering [facility]. CSW requested he go tour facility today to assist with the decision making process.    Expected Discharge Plan: Skilled Nursing Facility Barriers to Discharge: Continued Medical Work up, English as a second language teacher, SNF Pending bed offer  Expected Discharge Plan and Services In-house Referral: Clinical Social Work   Post Acute Care Choice: Skilled Nursing Facility Living arrangements for the past 2 months: Single Family Home                                       Social Determinants of Health (SDOH) Interventions SDOH Screenings   Food Insecurity: No Food Insecurity (04/25/2023)  Housing: Low Risk  (04/25/2023)  Transportation Needs: No Transportation Needs (04/25/2023)  Utilities: Not At Risk (04/25/2023)  Depression (PHQ2-9): Low Risk  (10/31/2019)  Tobacco Use: Low Risk  (04/22/2023)    Readmission Risk Interventions     No data to display

## 2023-05-03 ENCOUNTER — Inpatient Hospital Stay (HOSPITAL_COMMUNITY): Payer: Medicare HMO

## 2023-05-03 DIAGNOSIS — E039 Hypothyroidism, unspecified: Secondary | ICD-10-CM | POA: Diagnosis not present

## 2023-05-03 LAB — CBC WITH DIFFERENTIAL/PLATELET
Abs Immature Granulocytes: 0.06 10*3/uL (ref 0.00–0.07)
Basophils Absolute: 0 10*3/uL (ref 0.0–0.1)
Basophils Relative: 0 %
Eosinophils Absolute: 0.1 10*3/uL (ref 0.0–0.5)
Eosinophils Relative: 1 %
HCT: 28.5 % — ABNORMAL LOW (ref 36.0–46.0)
Hemoglobin: 9.6 g/dL — ABNORMAL LOW (ref 12.0–15.0)
Immature Granulocytes: 1 %
Lymphocytes Relative: 10 %
Lymphs Abs: 0.8 10*3/uL (ref 0.7–4.0)
MCH: 28.4 pg (ref 26.0–34.0)
MCHC: 33.7 g/dL (ref 30.0–36.0)
MCV: 84.3 fL (ref 80.0–100.0)
Monocytes Absolute: 0.5 10*3/uL (ref 0.1–1.0)
Monocytes Relative: 7 %
Neutro Abs: 6.3 10*3/uL (ref 1.7–7.7)
Neutrophils Relative %: 81 %
Platelets: 271 10*3/uL (ref 150–400)
RBC: 3.38 MIL/uL — ABNORMAL LOW (ref 3.87–5.11)
RDW: 16 % — ABNORMAL HIGH (ref 11.5–15.5)
WBC: 7.8 10*3/uL (ref 4.0–10.5)
nRBC: 0 % (ref 0.0–0.2)

## 2023-05-03 LAB — TYPE AND SCREEN
ABO/RH(D): AB POS
Antibody Screen: NEGATIVE
Unit division: 0

## 2023-05-03 LAB — BASIC METABOLIC PANEL
Anion gap: 11 (ref 5–15)
BUN: 25 mg/dL — ABNORMAL HIGH (ref 8–23)
CO2: 24 mmol/L (ref 22–32)
Calcium: 8.7 mg/dL — ABNORMAL LOW (ref 8.9–10.3)
Chloride: 108 mmol/L (ref 98–111)
Creatinine, Ser: 1.18 mg/dL — ABNORMAL HIGH (ref 0.44–1.00)
GFR, Estimated: 51 mL/min — ABNORMAL LOW (ref >60)
Glucose, Bld: 229 mg/dL — ABNORMAL HIGH (ref 70–99)
Potassium: 3.6 mmol/L (ref 3.5–5.1)
Sodium: 143 mmol/L (ref 135–145)

## 2023-05-03 LAB — GLUCOSE, CAPILLARY
Glucose-Capillary: 239 mg/dL — ABNORMAL HIGH (ref 70–99)
Glucose-Capillary: 258 mg/dL — ABNORMAL HIGH (ref 70–99)
Glucose-Capillary: 193 mg/dL — ABNORMAL HIGH (ref 70–99)
Glucose-Capillary: 235 mg/dL — ABNORMAL HIGH (ref 70–99)

## 2023-05-03 LAB — BPAM RBC
Blood Product Expiration Date: 202412022359
ISSUE DATE / TIME: 202411111718
Unit Type and Rh: 8400

## 2023-05-03 LAB — MAGNESIUM: Magnesium: 2.2 mg/dL (ref 1.7–2.4)

## 2023-05-03 LAB — PROTIME-INR
INR: 1 (ref 0.8–1.2)
Prothrombin Time: 13.3 s (ref 11.4–15.2)

## 2023-05-03 LAB — BRAIN NATRIURETIC PEPTIDE: B Natriuretic Peptide: 873.9 pg/mL — ABNORMAL HIGH (ref 0.0–100.0)

## 2023-05-03 LAB — PHOSPHORUS: Phosphorus: 2 mg/dL — ABNORMAL LOW (ref 2.5–4.6)

## 2023-05-03 MED ORDER — FUROSEMIDE 10 MG/ML IJ SOLN
20.0000 mg | Freq: Once | INTRAMUSCULAR | Status: AC
  Administered 2023-05-03: 20 mg via INTRAVENOUS
  Filled 2023-05-03: qty 2

## 2023-05-03 MED ORDER — POTASSIUM PHOSPHATES 15 MMOLE/5ML IV SOLN
30.0000 mmol | Freq: Once | INTRAVENOUS | Status: AC
  Administered 2023-05-03: 30 mmol via INTRAVENOUS
  Filled 2023-05-03: qty 10

## 2023-05-03 NOTE — Plan of Care (Signed)

## 2023-05-03 NOTE — Inpatient Diabetes Management (Signed)
Inpatient Diabetes Program Recommendations  AACE/ADA: New Consensus Statement on Inpatient Glycemic Control (2015)  Target Ranges:  Prepandial:   less than 140 mg/dL      Peak postprandial:   less than 180 mg/dL (1-2 hours)      Critically ill patients:  140 - 180 mg/dL   Lab Results  Component Value Date   GLUCAP 239 (H) 05/03/2023   HGBA1C 9.9 (H) 04/24/2023    Latest Reference Range & Units 05/02/23 08:06 05/02/23 11:55 05/02/23 15:59 05/02/23 21:27 05/03/23 08:04  Glucose-Capillary 70 - 99 mg/dL 098 (H) 119 (H) 147 (H) 180 (H) 239 (H)  (H): Data is abnormally high  Review of Glycemic Control  Diabetes history: DM Outpatient Diabetes medications: Insulin pump Current orders for Inpatient glycemic control: Semglee 10 units daily, Novolog 0-6 units tid  Inpatient Diabetes Program Recommendations:   Please consider: -Increase Semglee to 12 units daily -Novolog 1 unit tid meal coverage if eats 50%  Thank you, Darel Hong E. Patria Warzecha, RN, MSN, CDCES  Diabetes Coordinator Inpatient Glycemic Control Team Team Pager [phone] (8am-5pm) 05/03/2023 11:10 AM

## 2023-05-03 NOTE — Progress Notes (Signed)
PROGRESS NOTE                                                                                                                                                                                                             Patient Demographics:    Katherine Navarro, is a 67 y.o. female, DOB - Mar 18, 1956, NWG:[id number]  Outpatient Primary MD for the patient is Jarold Motto, [place]    LOS - 11  Admit date - 04/22/2023    Chief Complaint  Patient presents with   Weakness       Brief Narrative (HPI from H&P)    DM-1 supposed to be on insulin pump, hypothyroidism and HTN brought to ED with progressive sleepiness over weeks, and admitted for metabolic encephalopathy in the setting of severe hypothyroidism with elevated TSH to 44 and undetectable free T4.  She apparently was noncompliant with her Synthroid medication, head CT, EEG, ABG, ammonia level stable.  She appears to be in myxedema coma, transferred to my care on 04/24/2023.   Subjective:   Patient in bed, appears comfortable, denies any headache, no fever, no chest pain or pressure, no shortness of breath , no abdominal pain. No focal weakness.   Assessment  & Plan :   Myxedema coma. Due to noncompliance with Synthroid, TSH more than 40, free T4 initially undetectable, stable head CT, EEG, ammonia and ABG, no focal deficits, placed on high-dose IV Synthroid, random cortisol over 100, TSH and free T4 improving, Synthroid dose dropped on 04/25/2024, mentation gradually improving, suspect there is some underlying psych issue as well, but denies, will continue to monitor TSH, free T4 and clinically.  Megace added for better appetite.  Overall gradual but definite improvement.  AKI on CKD 3A.  Not eating well at all, she has been adequately treated, encouraged to improve oral intake, Megace asdded, foley.  Renal function improving.  Urinary retention.  Foley Flomax, failed Foley removal,  also has some constipation, bowel regimen initiated.  Monitor.  Hypophosphatemia, hypokalemia.  Replaced.    Poor Oral intake due to #1 above.  Megace supplementation added.  Patient encouraged.  Monitor.    Chronic anemia with evidence of acute worsening due to heme dilution.  No signs of GI bleeding, does have right upper extremity hematoma, replace folic acid and thiamine as they were deficient, placed on PPI  transfuse 1 unit on 05/02/2023.  Outpatient anemia workup.  Right upper arm hematoma and swelling.  Stable venous ultrasound, does have a hematoma in the right arm, patient does not recall hurting it, pain and swelling have improved, remove any IV access from the right arm, keep right arm elevated, since she has a moderate to large hematoma stop Lovenox and switch to SCDs.  Likely undiagnosed OSA.  Nighttime oxygen with outpatient sleep study.    DM type I.  Now every 4 hours sliding scale and monitor.  Check A1c.  Will IV fluids as oral intake is pretty poor.   CBG (last 3)  Recent Labs    05/02/23 1559 05/02/23 2127 05/03/23 0804  GLUCAP 230* 180* 239*   Lab Results  Component Value Date   HGBA1C 9.9 (H) 04/24/2023         Condition - Extremely Guarded  Family Communication  :  husband [phone]  on 04/24/23, 04/26/2023, updated 04/27/2023, 07/01/2022  Code Status :  Full  Consults  :  None  PUD Prophylaxis :    Procedures  :     Right upper extremity venous duplex.  No blood clots.    Renal ultrasound.  Nonacute.    CT head and EEG - non acute      Disposition Plan  :    Status is: Inpatient   DVT Prophylaxis  : Lovenox stopped on 05/03/2023 due to right upper extremity hematoma.  Switch to SCDs.  Place and maintain sequential compression device Start: 05/03/23 1035  Lab Results  Component Value Date   PLT 271 05/03/2023    Diet :  Diet Order             DIET DYS 3 Room service appropriate? Yes with Assist; Fluid consistency: Thin  Diet  effective now                    Inpatient Medications  Scheduled Meds:  (feeding supplement) PROSource Plus  30 mL Oral BID BM   Chlorhexidine Gluconate Cloth  6 each Topical Q0600   docusate sodium  100 mg Oral BID   folic acid  1 mg Oral Daily   furosemide  20 mg Intravenous Once   insulin aspart  0-6 Units Subcutaneous TID WC   insulin glargine-yfgn  10 Units Subcutaneous Daily   lactose free nutrition  237 mL Oral TID WC   levothyroxine  200 mcg Intravenous Daily   megestrol  40 mg Oral Daily   mouth rinse  15 mL Mouth Rinse 4 times per day   pantoprazole  40 mg Oral Daily   polyethylene glycol  17 g Oral BID   tamsulosin  0.4 mg Oral Daily   thiamine (VITAMIN B1) injection  100 mg Intravenous Daily   Continuous Infusions:  potassium PHOSPHATE IVPB (in mmol) 30 mmol (05/03/23 0640)    PRN Meds:.acetaminophen **OR** acetaminophen, dextrose, [DISCONTINUED] ondansetron **OR** ondansetron (ZOFRAN) IV, mouth rinse, sorbitol  Antibiotics  :    Anti-infectives (From admission, onward)    None         Objective:   Vitals:   05/03/23 0200 05/03/23 0400 05/03/23 0600 05/03/23 0800  BP: 138/83 123/65 110/66 122/66  Pulse: (!) 102 99 94 96  Resp: 17 16 18 18   Temp:  98 F (36.7 C)  (!) 97.5 F (36.4 C)  TempSrc:  Oral  Oral  SpO2: 100% 91% 91% 96%  Weight:      Height:  Wt Readings from Last 3 Encounters:  04/23/23 72.9 kg  03/19/21 72.9 kg  12/18/20 71.7 kg     Intake/Output Summary (Last 24 hours) at 05/03/2023 1034 Last data filed at 05/03/2023 0640 Gross per 24 hour  Intake 407.66 ml  Output 1600 ml  Net -1192.34 ml      Physical Exam  Awake,, no focal deficits, now answering basic questions and following basic commands, Foley catheter in place [place].AT,PERRAL Supple Neck, No JVD,   Symmetrical Chest wall movement, Good air movement bilaterally, CTAB RRR,No Gallops,Rubs or new Murmurs,  +ve B.Sounds, Abd Soft, No tenderness,   Right  upper arm has hematoma and swelling,      Data Review:    Recent Labs  Lab 04/29/23 0451 04/30/23 0321 05/01/23 0305 05/02/23 0426 05/03/23 0423  WBC 5.6 6.7 6.2 5.4 7.8  HGB 8.0* 9.4* 7.6* 7.0* 9.6*  HCT 24.2* 27.7* 22.3* 20.9* 28.5*  PLT 166 195 206 204 271  MCV 85.2 86.0 85.8 86.4 84.3  MCH 28.2 29.2 29.2 28.9 28.4  MCHC 33.1 33.9 34.1 33.5 33.7  RDW 15.4 15.6* 16.0* 16.0* 16.0*  LYMPHSABS 1.2 0.7 0.7 0.5* 0.8  MONOABS 0.5 0.8 0.7 0.4 0.5  EOSABS 0.1 0.1 0.1 0.1 0.1  BASOSABS 0.0 0.0 0.0 0.0 0.0    Recent Labs  Lab 04/26/23 1040 04/27/23 0944 04/28/23 0433 04/29/23 0451 04/30/23 0321 05/01/23 0305 05/02/23 0426 05/03/23 0423  NA 140 135 139 139 138 141 140 143  K 3.2* 3.1* 2.9* 3.6 4.2 3.7 4.1 3.6  CL 103 100 103 104 105 107 108 108  CO2 25 25 28 27 23 22  19* 24  ANIONGAP 12 10 8 8 10 12 13 11   GLUCOSE 55* 336* 147* 251* 286* 236* 360* 229*  BUN 16 15 10 14 20 21 20  25*  CREATININE 0.79 1.03* 0.69 0.97 1.44* 1.24* 1.32* 1.18*  INR  --   --   --   --   --   --   --  1.0  TSH 10.652*  --   --   --  0.933  --   --   --   BNP 382.4* 171.2* 168.5*  --   --   --   --  873.9*  MG 1.9 2.1 2.0 1.7 1.9 1.7 2.1 2.2  CALCIUM 8.9 8.7* 8.5* 7.7* 8.3* 8.4* 8.2* 8.7*      Recent Labs  Lab 04/26/23 1040 04/27/23 0944 04/28/23 0433 04/29/23 0451 04/30/23 0321 05/01/23 0305 05/02/23 0426 05/03/23 0423  INR  --   --   --   --   --   --   --  1.0  TSH 10.652*  --   --   --  0.933  --   --   --   BNP 382.4* 171.2* 168.5*  --   --   --   --  873.9*  MG 1.9 2.1 2.0 1.7 1.9 1.7 2.1 2.2  CALCIUM 8.9 8.7* 8.5* 7.7* 8.3* 8.4* 8.2* 8.7*    --------------------------------------------------------------------------------------------------------------- Lab Results  Component Value Date   CHOL 240 (H) 09/02/2017   HDL 69.90 09/02/2017   LDLCALC 149 (H) 09/02/2017   LDLDIRECT 210.4 05/14/2013   TRIG 106.0 09/02/2017   CHOLHDL 3 09/02/2017    Lab Results  Component  Value Date   HGBA1C 9.9 (H) 04/24/2023   No results for input(s): "TSH", "T4TOTAL", "FREET4", "T3FREE", "THYROIDAB" in the last 72 hours.   Recent Labs    05/01/23  0724  VITAMINB12 762  FOLATE 2.4*  FERRITIN 474*  TIBC 165*  IRON 35  RETICCTPCT 1.2    ------------------------------------------------------------------------------------------------------------------ Cardiac Enzymes No results for input(s): "CKMB", "TROPONINI", "MYOGLOBIN" in the last 168 hours.  Invalid input(s): "CK"  Micro Results No results found for this or any previous visit (from the past 240 hour(s)).   Radiology Reports DG Chest Port 1 View  Result Date: 05/03/2023 CLINICAL DATA:  Shortness of breath. EXAM: PORTABLE CHEST 1 VIEW COMPARISON:  04/22/2023 FINDINGS: Slightly prominent vascular markings in the retrocardiac space. No focal lung disease. Heart and mediastinum are within normal limits. Trachea is midline. No acute bone abnormality. Negative for a pneumothorax. IMPRESSION: No acute cardiopulmonary disease. Electronically Signed   By: Richarda Overlie M.D.   On: 05/03/2023 08:13   VAS [place] UPPER EXTREMITY VENOUS DUPLEX  Result Date: 05/02/2023 UPPER VENOUS STUDY  Patient Name:  LOCKLYNN HUVAL  Date of Exam:   05/02/2023 Medical Rec #: [id number]         Accession #:    [id number] Date of Birth: 10/08/1955         Patient Gender: F Patient Age:   71 years Exam Location:  [facility] Procedure:      VAS [place] UPPER EXTREMITY VENOUS DUPLEX Referring Phys: Bess Harvest [facility] --------------------------------------------------------------------------------  Indications: Swelling, and bruising Anticoagulation: Patient could not externally extend or rotate arm to be properly evaluated. Limitations: Body habitus, poor ultrasound/tissue interface and rigid. Comparison Study: No prior exam. Performing Technologist: Fernande Bras  Examination Guidelines: A complete evaluation includes B-mode imaging, spectral  Doppler, color Doppler, and power Doppler as needed of all accessible portions of each vessel. Bilateral testing is considered an integral part of a complete examination. Limited examinations for reoccurring indications may be performed as noted.  Right Findings: +----------+------------+---------+-----------+----------+-------+ RIGHT     CompressiblePhasicitySpontaneousPropertiesSummary +----------+------------+---------+-----------+----------+-------+ IJV           Full       Yes       Yes                      +----------+------------+---------+-----------+----------+-------+ Subclavian    Full       Yes       Yes                      +----------+------------+---------+-----------+----------+-------+ Axillary      Full       Yes       Yes                      +----------+------------+---------+-----------+----------+-------+ Brachial      Full                                          +----------+------------+---------+-----------+----------+-------+ Cephalic      Full                                          +----------+------------+---------+-----------+----------+-------+ Basilic       Full                                          +----------+------------+---------+-----------+----------+-------+  Left Findings: +----------+------------+---------+-----------+----------+-------+ LEFT      CompressiblePhasicitySpontaneousPropertiesSummary +----------+------------+---------+-----------+----------+-------+ Subclavian               Yes       Yes                      +----------+------------+---------+-----------+----------+-------+  Summary:  Right: No evidence of deep vein thrombosis in the upper extremity. No evidence of superficial vein thrombosis in the upper extremity.  Left: No evidence of thrombosis in the subclavian.  *See table(s) above for measurements and observations.  Diagnosing physician: Gerarda Fraction Electronically signed by Gerarda Fraction  on 05/02/2023 at 6:13:29 PM.    Final    US RENAL  Result Date: 04/30/2023 CLINICAL DATA:  Acute kidney injury. EXAM: RENAL / URINARY TRACT ULTRASOUND COMPLETE COMPARISON:  None Available. FINDINGS: Right Kidney: Renal measurements: 9.3 x 4.9 x 4.6 cm = volume: 109 mL. Increased echogenicity of renal parenchyma is noted. No mass or hydronephrosis visualized. Left Kidney: Renal measurements: 9.6 x 5.5 x 6.6 cm = volume: 181 mL. Increased echogenicity of renal parenchyma is noted. No mass or hydronephrosis visualized. Bladder: Moderate amount of mildly echogenic material is noted dependently in urinary bladder suggesting debris. Other: None. IMPRESSION: Increased echogenicity of renal parenchyma is noted bilaterally suggesting medical renal disease. No hydronephrosis or renal obstruction is noted. Moderate amount of mildly echogenic material noted dependently and urinary bladder concerning for debris, potentially inflammatory in etiology. Electronically Signed   By: Lupita Raider M.D.   On: 04/30/2023 11:41      Signature  -   Susa Raring M.D on 05/03/2023 at 10:34 AM   -  To page go to [email or web address]

## 2023-05-03 NOTE — Progress Notes (Signed)
Physical Therapy Treatment Patient Details Name: Katherine Navarro MRN: [id number] DOB: [birth year] Today's Date: 05/03/2023   History of Present Illness Pt is a 67 y/o F presenting to ED on 11/1 with progressive sleepiness, CT head negative, Admitted for metabolic encephalopathy in setting of hypothyroidism with elevated TSH. PMH includes DM1, hypothyroidism, HTN, diabetic retinopathy    PT Comments  Pt received in supine and requesting to transfer to the recliner. Pt noted to have bowel incontinence upon entry and required total A with pericare. Pt unable to hold onto rail with LUE to assist with rolling despite cues. Pt requires max A +2-total A +2 for all mobility tasks due to difficulty following commands, weakness, and impaired balance. Pt requires cues to keep eyes open during session and to initiate movements. Pt demonstrates strong R lateral lean in sitting requiring consistent assist to maintain upright posture. Pt declines standing trial despite dense cues and requests to return to supine, requiring max encouragement to transfer to recliner. Pt requires total A +2 to squat pivot to recliner and reposition. Pt continues to benefit from PT services to progress toward functional mobility goals.    If plan is discharge home, recommend the following: Assistance with cooking/housework;Assistance with feeding;Help with stairs or ramp for entrance;Direct supervision/assist for medications management;Direct supervision/assist for financial management;Assist for transportation;Supervision due to cognitive status;Two people to help with walking and/or transfers   Can travel by private vehicle     No  Equipment Recommendations  None recommended by PT    Recommendations for Other Services       Precautions / Restrictions Precautions Precautions: Fall Restrictions Weight Bearing Restrictions: No     Mobility  Bed Mobility Overal bed mobility: Needs Assistance Bed Mobility: Supine to Sit,  Rolling Rolling: Max assist   Supine to sit: Max assist, +2 for physical assistance, HOB elevated     General bed mobility comments: Pt able to advance BLE to EOB, however requires max A +2 for all other aspects despite dense cues for technique. Pt requesting to pull up with HHA instead of using the rail    Transfers Overall transfer level: Needs assistance Equipment used: Rolling walker (2 wheels) Transfers: Bed to chair/wheelchair/BSC       Squat pivot transfers: Total assist, +2 physical assistance     General transfer comment: Pt declines standing trial despite encouragement and required total A +2 to pivot to recliner due to pt not following commands for technique       Balance Overall balance assessment: Needs assistance Sitting-balance support: No upper extremity supported, Feet supported Sitting balance-Leahy Scale: Zero Sitting balance - Comments: Pt required consistent mod-max A to maintain sitting balance at EOB due to strong R lean despite dense cues for pt to correct. Postural control: Right lateral lean                                  Cognition Arousal: Lethargic Behavior During Therapy: Flat affect Overall Cognitive Status: No family/caregiver present to determine baseline cognitive functioning                                 General Comments: increased cues for participation and initiation        Exercises      General Comments        Pertinent Vitals/Pain Pain Assessment Pain Assessment: Faces Faces  Pain Scale: Hurts little more Pain Location: RUE Pain Descriptors / Indicators: Guarding, Grimacing Pain Intervention(s): Monitored during session, Repositioned     PT Goals (current goals can now be found in the care plan section) Acute Rehab PT Goals Patient Stated Goal: none stated PT Goal Formulation: With patient/family Time For Goal Achievement: 05/07/23 Progress towards PT goals: Not progressing toward goals  - comment (limited by fatigue and impaired cognition)    Frequency    Min 1X/week           Co-evaluation PT/OT/SLP Co-Evaluation/Treatment: Yes Reason for Co-Treatment: Complexity of the patient's impairments (multi-system involvement);For patient/therapist safety;To address functional/ADL transfers;Necessary to address cognition/behavior during functional activity PT goals addressed during session: Mobility/safety with mobility;Balance        AM-PAC PT "6 Clicks" Mobility   Outcome Measure  Help needed turning from your back to your side while in a flat bed without using bedrails?: Total Help needed moving from lying on your back to sitting on the side of a flat bed without using bedrails?: Total Help needed moving to and from a bed to a chair (including a wheelchair)?: Total Help needed standing up from a chair using your arms (e.g., wheelchair or bedside chair)?: Total Help needed to walk in hospital room?: Total Help needed climbing 3-5 steps with a railing? : Total 6 Click Score: 6    End of Session Equipment Utilized During Treatment: Gait belt Activity Tolerance: Patient limited by fatigue Patient left: in chair;with chair alarm set;with call bell/phone within reach Nurse Communication: Mobility status PT Visit Diagnosis: Other abnormalities of gait and mobility (R26.89);Muscle weakness (generalized) (M62.81);Difficulty in walking, not elsewhere classified (R26.2)     Time: 4034-7425 PT Time Calculation (min) (ACUTE ONLY): 27 min  Charges:    $Therapeutic Activity: 8-22 mins PT General Charges $$ ACUTE PT VISIT: 1 Visit                     Johny Shock, PTA Acute Rehabilitation Services Secure Chat Preferred  [phone]    Johny Shock 05/03/2023, 10:34 AM

## 2023-05-03 NOTE — Progress Notes (Signed)
Speech Language Pathology Treatment: Dysphagia  Patient Details Name: MATTY MONTOTO MRN: [id number] DOB: 02-Dec-1955 Today's Date: 05/03/2023 Time: 8295-6213 SLP Time Calculation (min) (ACUTE ONLY): 8 min  Assessment / Plan / Recommendation Clinical Impression  Pt was seen for skilled ST targeting diet tolerance.  She was encountered asleep in chair, but roused easily to verbal stimulation and was very pleasant and agreeable to this session.  Pt was seen with trials of thin liquid via straw and soft solids (banana).  She exhibited mildly prolonged, but effective mastication of solids and no overt s/sx of aspiration were observed with PO trials.  Pt will continue to benefit from feeding assistance during meals secondary to vision deficits.  No further skilled ST is warranted at this time, please re-consult if additional needs arise.     HPI HPI: Patient is a 67 y.o. female with PMH: hypothyroidism, type 1 DM, glaucoma, HTN who has not been compliant with her thyroid medication or diabetes management/medications. She presented to the hospital on 04/22/2023 from  home because of progressive sleepiness over past weeks. CT head was negative. She was admitted for metabolic encephalopathy in the setting of hypothyroidism with elevated TSH.      SLP Plan  All goals met;Discharge SLP treatment due to (comment) (goals met)      Recommendations for follow up therapy are one component of a multi-disciplinary discharge planning process, led by the attending physician.  Recommendations may be updated based on patient status, additional functional criteria and insurance authorization.    Recommendations  Diet recommendations: Dysphagia 3 (mechanical soft);Thin liquid Liquids provided via: Cup;Straw Medication Administration: Whole meds with liquid Supervision: Staff to assist with self feeding;Full supervision/cueing for compensatory strategies Compensations: Slow rate;Small sips/bites;Minimize  environmental distractions Postural Changes and/or Swallow Maneuvers: Seated upright 90 degrees                  Oral care BID   Intermittent Supervision/Assistance Dysphagia, unspecified (R13.10)     All goals met;Discharge SLP treatment due to (comment) (goals met)    Eino Farber, M.S., CCC-SLP Acute Rehabilitation Services Office: [phone]  Shanon Rosser [facility]  05/03/2023, 11:27 AM

## 2023-05-03 NOTE — Progress Notes (Signed)
Occupational Therapy Treatment Patient Details Name: Katherine Navarro MRN: [id number] DOB: Jan 06, 1956 Today's Date: 05/03/2023   History of present illness Pt is a 67 y/o F presenting to ED on 11/1 with progressive sleepiness, CT head negative, Admitted for metabolic encephalopathy in setting of hypothyroidism with elevated TSH. PMH includes DM1, hypothyroidism, HTN, diabetic retinopathy   OT comments  Pt making slow progression towards goals this session, needing total A for pericare at bed level. Pt max +2 for bed mobility and with strong R lateral lean sitting EOB. Pt total A +2 for squat pivot transfer to chair. Pt with decr attention, needs cues for task sequencing throughout session. Pt presenting with impairments listed below, will follow acutely. Patient will benefit from continued inpatient follow up therapy, <3 hours/day to maximize safety/ind with ADLs/functional mobility.       If plan is discharge home, recommend the following:  Two people to help with walking and/or transfers;A lot of help with bathing/dressing/bathroom;Assistance with cooking/housework;Direct supervision/assist for medications management;Direct supervision/assist for financial management;Assist for transportation;Help with stairs or ramp for entrance;Supervision due to cognitive status   Equipment Recommendations  Other (comment) (defer)    Recommendations for Other Services PT consult    Precautions / Restrictions Precautions Precautions: Fall Restrictions Weight Bearing Restrictions: No       Mobility Bed Mobility Overal bed mobility: Needs Assistance Bed Mobility: Supine to Sit, Rolling Rolling: Max assist   Supine to sit: Max assist, +2 for physical assistance, HOB elevated          Transfers Overall transfer level: Needs assistance Equipment used: Rolling walker (2 wheels) Transfers: Bed to chair/wheelchair/BSC     Squat pivot transfers: Total assist, +2 physical assistance        General transfer comment: Pt declines standing trial despite encouragement and required total A +2 to pivot to recliner due to pt not following commands for technique     Balance Overall balance assessment: Needs assistance Sitting-balance support: No upper extremity supported, Feet supported Sitting balance-Leahy Scale: Zero Sitting balance - Comments: Pt required consistent mod-max A to maintain sitting balance at EOB due to strong R lean despite dense cues for pt to correct. Postural control: Right lateral lean                                 ADL either performed or assessed with clinical judgement   ADL Overall ADL's : Needs assistance/impaired                         Toilet Transfer: Total assistance;+2 for physical assistance   Toileting- Clothing Manipulation and Hygiene: Total assistance Toileting - Clothing Manipulation Details (indicate cue type and reason): pericare in sidelying     Functional mobility during ADLs: Maximal assistance;Total assistance;+2 for physical assistance      Extremity/Trunk Assessment Upper Extremity Assessment Upper Extremity Assessment: Generalized weakness RUE Deficits / Details: weaker compared to LUE, pt reports having some decr sensation in R hand   Lower Extremity Assessment Lower Extremity Assessment: Defer to PT evaluation        Vision   Additional Comments: L eye blind per chart review, able to see shapes/shadows but difficulty stating number held up in central visual field   Perception Perception Perception: Not tested   Praxis Praxis Praxis: Not tested    Cognition Arousal: Lethargic Behavior During Therapy: Flat affect Overall Cognitive Status: No  family/caregiver present to determine baseline cognitive functioning                                 General Comments: increased cues for participation and initiation        Exercises      Shoulder Instructions       General  Comments VSS    Pertinent Vitals/ Pain       Pain Assessment Faces Pain Scale: Hurts little more Pain Location: RUE Pain Descriptors / Indicators: Guarding, Grimacing Pain Intervention(s): Limited activity within patient's tolerance, Monitored during session, Repositioned  Home Living                                          Prior Functioning/Environment              Frequency  Min 1X/week        Progress Toward Goals  OT Goals(current goals can now be found in the care plan section)  Progress towards OT goals: Progressing toward goals  Acute Rehab OT Goals Patient Stated Goal: none stated OT Goal Formulation: With patient Time For Goal Achievement: 05/08/23 Potential to Achieve Goals: Good ADL Goals Pt Will Perform Grooming: with supervision;sitting Pt Will Transfer to Toilet: with min assist;squat pivot transfer;stand pivot transfer;bedside commode Additional ADL Goal #1: pt will perform bed mobility min A in prep for ADLs Additional ADL Goal #2: Pt will visually locate 3 items needed for ADL or functional task in order to promote ind with ADLs  Plan      Co-evaluation    PT/OT/SLP Co-Evaluation/Treatment: Yes Reason for Co-Treatment: Complexity of the patient's impairments (multi-system involvement);For patient/therapist safety;To address functional/ADL transfers;Necessary to address cognition/behavior during functional activity PT goals addressed during session: Mobility/safety with mobility;Balance OT goals addressed during session: ADL's and self-care      AM-PAC OT "6 Clicks" Daily Activity     Outcome Measure   Help from another person eating meals?: A Little Help from another person taking care of personal grooming?: A Little Help from another person toileting, which includes using toliet, bedpan, or urinal?: A Lot Help from another person bathing (including washing, rinsing, drying)?: A Lot Help from another person to put on and  taking off regular upper body clothing?: A Lot Help from another person to put on and taking off regular lower body clothing?: A Lot 6 Click Score: 14    End of Session Equipment Utilized During Treatment: Gait belt;Rolling walker (2 wheels)  OT Visit Diagnosis: Unsteadiness on feet (R26.81);Other abnormalities of gait and mobility (R26.89);Muscle weakness (generalized) (M62.81)   Activity Tolerance Patient tolerated treatment well   Patient Left in chair;with call bell/phone within reach;with chair alarm set   Nurse Communication Mobility status        Time: 4098-1191 OT Time Calculation (min): 28 min  Charges: OT General Charges $OT Visit: 1 Visit OT Treatments $Self Care/Home Management : 23-37 mins  Carver Fila, OTD, OTR/L SecureChat Preferred Acute Rehab [phone]   Katherine Navarro 05/03/2023, 12:01 PM

## 2023-05-04 ENCOUNTER — Inpatient Hospital Stay (HOSPITAL_COMMUNITY): Payer: Medicare HMO

## 2023-05-04 DIAGNOSIS — E039 Hypothyroidism, unspecified: Secondary | ICD-10-CM | POA: Diagnosis not present

## 2023-05-04 DIAGNOSIS — R627 Adult failure to thrive: Secondary | ICD-10-CM

## 2023-05-04 LAB — BASIC METABOLIC PANEL
Anion gap: 9 (ref 5–15)
BUN: 20 mg/dL (ref 8–23)
CO2: 27 mmol/L (ref 22–32)
Calcium: 8.6 mg/dL — ABNORMAL LOW (ref 8.9–10.3)
Chloride: 105 mmol/L (ref 98–111)
Creatinine, Ser: 1.04 mg/dL — ABNORMAL HIGH (ref 0.44–1.00)
GFR, Estimated: 60 mL/min — ABNORMAL LOW (ref >60)
Glucose, Bld: 263 mg/dL — ABNORMAL HIGH (ref 70–99)
Potassium: 3.7 mmol/L (ref 3.5–5.1)
Sodium: 141 mmol/L (ref 135–145)

## 2023-05-04 LAB — CBC WITH DIFFERENTIAL/PLATELET
Abs Immature Granulocytes: 0.06 10*3/uL (ref 0.00–0.07)
Basophils Absolute: 0 10*3/uL (ref 0.0–0.1)
Basophils Relative: 0 %
Eosinophils Absolute: 0.1 10*3/uL (ref 0.0–0.5)
Eosinophils Relative: 2 %
HCT: 27.9 % — ABNORMAL LOW (ref 36.0–46.0)
Hemoglobin: 9.5 g/dL — ABNORMAL LOW (ref 12.0–15.0)
Immature Granulocytes: 1 %
Lymphocytes Relative: 9 %
Lymphs Abs: 0.6 10*3/uL — ABNORMAL LOW (ref 0.7–4.0)
MCH: 28.6 pg (ref 26.0–34.0)
MCHC: 34.1 g/dL (ref 30.0–36.0)
MCV: 84 fL (ref 80.0–100.0)
Monocytes Absolute: 0.5 10*3/uL (ref 0.1–1.0)
Monocytes Relative: 7 %
Neutro Abs: 5.2 10*3/uL (ref 1.7–7.7)
Neutrophils Relative %: 81 %
Platelets: 265 10*3/uL (ref 150–400)
RBC: 3.32 MIL/uL — ABNORMAL LOW (ref 3.87–5.11)
RDW: 15.8 % — ABNORMAL HIGH (ref 11.5–15.5)
WBC: 6.5 10*3/uL (ref 4.0–10.5)
nRBC: 0 % (ref 0.0–0.2)

## 2023-05-04 LAB — MAGNESIUM
Magnesium: 1.9 mg/dL (ref 1.7–2.4)
Magnesium: 2 mg/dL (ref 1.7–2.4)

## 2023-05-04 LAB — GLUCOSE, CAPILLARY
Glucose-Capillary: 164 mg/dL — ABNORMAL HIGH (ref 70–99)
Glucose-Capillary: 169 mg/dL — ABNORMAL HIGH (ref 70–99)
Glucose-Capillary: 202 mg/dL — ABNORMAL HIGH (ref 70–99)
Glucose-Capillary: 229 mg/dL — ABNORMAL HIGH (ref 70–99)
Glucose-Capillary: 264 mg/dL — ABNORMAL HIGH (ref 70–99)

## 2023-05-04 LAB — BRAIN NATRIURETIC PEPTIDE: B Natriuretic Peptide: 971.2 pg/mL — ABNORMAL HIGH (ref 0.0–100.0)

## 2023-05-04 LAB — PHOSPHORUS
Phosphorus: 2.6 mg/dL (ref 2.5–4.6)
Phosphorus: 2.1 mg/dL — ABNORMAL LOW (ref 2.5–4.6)

## 2023-05-04 MED ORDER — PROSOURCE TF20 ENFIT COMPATIBL EN LIQD
60.0000 mL | Freq: Every day | ENTERAL | Status: DC
  Administered 2023-05-04 – 2023-05-16 (×12): 60 mL
  Filled 2023-05-04 (×12): qty 60

## 2023-05-04 MED ORDER — INSULIN GLARGINE-YFGN 100 UNIT/ML ~~LOC~~ SOLN
15.0000 [IU] | Freq: Every day | SUBCUTANEOUS | Status: DC
Start: 2023-05-04 — End: 2023-05-05
  Administered 2023-05-04 – 2023-05-05 (×2): 15 [IU] via SUBCUTANEOUS
  Filled 2023-05-04 (×2): qty 0.15

## 2023-05-04 MED ORDER — ADULT MULTIVITAMIN W/MINERALS CH
1.0000 | ORAL_TABLET | Freq: Every day | ORAL | Status: DC
  Administered 2023-05-04 – 2023-05-07 (×4): 1 via ORAL
  Filled 2023-05-04 (×4): qty 1

## 2023-05-04 MED ORDER — OSMOLITE 1.5 CAL PO LIQD
1000.0000 mL | ORAL | Status: DC
  Administered 2023-05-04: 1000 mL
  Filled 2023-05-04 (×3): qty 1000

## 2023-05-04 MED ORDER — FREE WATER
150.0000 mL | Status: DC
  Administered 2023-05-04 – 2023-05-10 (×33): 150 mL

## 2023-05-04 NOTE — Plan of Care (Signed)

## 2023-05-04 NOTE — Progress Notes (Signed)
Mobility Specialist Progress Note;   05/04/23 1445  Mobility  Activity Transferred from chair to bed  Level of Assistance +2 (takes two people)  Assistive Device MaxiMove  Activity Response Tolerated well  Mobility Referral Yes  Mobility Specialist Start Time (ACUTE ONLY) 1445  Mobility Specialist Stop Time (ACUTE ONLY) 1500  Mobility Specialist Time Calculation (min) (ACUTE ONLY) 15 min   Assisted RN with transfer from chair to bed via [facility]. Pt agreeable to mobility. Required dependent +2 assistance for transfer. Pt displays blindness in L eye. No c/o during session. Pt back in bed with all needs met.   Caesar Bookman Mobility Specialist Please contact via SecureChat or Rehab Office [phone]

## 2023-05-04 NOTE — Procedures (Signed)
Cortrak  Person Inserting Tube:  Greig Castilla D, RD Tube Type:  Cortrak - 43 inches Tube Size:  10 Tube Location:  Left nare Secured by: Bridle Technique Used to Measure Tube Placement:  Marking at nare/corner of mouth Cortrak Secured At:  60 cm Procedure Comments:  Cortrak Tube Team Note:  Consult received to place a Cortrak feeding tube.   X-ray is required, abdominal x-ray has been ordered by the Cortrak team. Please confirm tube placement before using the Cortrak tube.   If the tube becomes dislodged please keep the tube and contact the Cortrak team at [email or web address] for replacement.  If after hours and replacement cannot be delayed, place a NG tube and confirm placement with an abdominal x-ray.    Greig Castilla, RD, LDN Registered Dietitian II RD pager # available in AMION  After hours/weekend pager # available in [facility]

## 2023-05-04 NOTE — Progress Notes (Signed)
PROGRESS NOTE                                                                                                                                                                                                             Patient Demographics:    Katherine Navarro, is a 67 y.o. female, DOB - [birth year], ZOX:[id number]  Outpatient Primary MD for the patient is Jarold Motto, [place]    LOS - 12  Admit date - 04/22/2023    Chief Complaint  Patient presents with   Weakness       Brief Narrative (HPI from H&P)      DM-1 supposed to be on insulin pump, hypothyroidism and HTN brought to ED with progressive sleepiness over weeks, and admitted for metabolic encephalopathy in the setting of severe hypothyroidism with elevated TSH to 44 and undetectable free T4.  She apparently was noncompliant with her Synthroid medication, head CT, EEG, ABG, ammonia level stable.  She appears to be in myxedema coma, transferred to my care on 04/24/2023.   Subjective:   Patient herself denies any complaints today, but remains with poor oral intake as discussed with staff    Assessment  & Plan :   Myxedema coma.  Due to noncompliance with Synthroid, TSH more than 40, free T4 initially undetectable, stable head CT, EEG, ammonia and ABG, no focal deficits, placed on high-dose IV Synthroid, random cortisol over 100, TSH and free T4 improving, Synthroid dose dropped on 04/25/2024, mentation gradually improving, suspect there is some underlying psych issue as well, but denies, will continue to monitor TSH, free T4 and clinically.  Megace added for better appetite.  Overall gradual but definite improvement.  AKI on CKD 3A.  - Not eating well at all, she has been adequately treated, encouraged to improve oral intake, Megace asdded, foley.  Renal function improving.  Urinary retention.  Foley Flomax, failed Foley removal, also has some constipation, bowel regimen  initiated.  Monitor.  She will need voiding trial when she is more ambulatory.  Hypophosphatemia, hypokalemia.  Replaced.    Poor Oral intake due to #1 above.   Failure to thrive  - Megace supplementation added.  Patient encouraged.  Have discussed with staff to assist with her feeding, will have nutritionist evaluate her as well.  Chronic anemia with evidence of acute worsening due to heme  dilution.  No signs of GI bleeding, does have right upper extremity hematoma, replace folic acid and thiamine as they were deficient, placed on PPI transfuse 1 unit on 05/02/2023.  Outpatient anemia workup.  Right upper arm hematoma and swelling.  Stable venous ultrasound, does have a hematoma in the right arm, patient does not recall hurting it, pain and swelling have improved, remove any IV access from the right arm, keep right arm elevated, since she has a moderate to large hematoma stop Lovenox and switch to SCDs.  Likely undiagnosed OSA.  Nighttime oxygen with outpatient sleep study.    DM type I.  Now every 4 hours sliding scale and monitor.  Check A1c.  Will IV fluids as oral intake is pretty poor.   CBG (last 3)  Recent Labs    05/03/23 2058 05/04/23 0757 05/04/23 1210  GLUCAP 235* 229* 169*   Lab Results  Component Value Date   HGBA1C 9.9 (H) 04/24/2023         Condition - Extremely Guarded  Family Communication  : None at bedside today  Code Status :  Full  Consults  :  None  PUD Prophylaxis :    Procedures  :     Right upper extremity venous duplex.  No blood clots.    Renal ultrasound.  Nonacute.    CT head and EEG - non acute      Disposition Plan  :    Status is: Inpatient   DVT Prophylaxis  : Lovenox stopped on 05/03/2023 due to right upper extremity hematoma.  Switch to SCDs.  Place and maintain sequential compression device Start: 05/03/23 1035  Lab Results  Component Value Date   PLT 265 05/04/2023    Diet :  Diet Order             DIET DYS 3  Room service appropriate? Yes with Assist; Fluid consistency: Thin  Diet effective now                    Inpatient Medications  Scheduled Meds:  (feeding supplement) PROSource Plus  30 mL Oral BID BM   Chlorhexidine Gluconate Cloth  6 each Topical Q0600   docusate sodium  100 mg Oral BID   folic acid  1 mg Oral Daily   insulin aspart  0-6 Units Subcutaneous TID WC   insulin glargine-yfgn  15 Units Subcutaneous Daily   lactose free nutrition  237 mL Oral TID WC   levothyroxine  200 mcg Intravenous Daily   megestrol  40 mg Oral Daily   mouth rinse  15 mL Mouth Rinse 4 times per day   pantoprazole  40 mg Oral Daily   polyethylene glycol  17 g Oral BID   tamsulosin  0.4 mg Oral Daily   thiamine (VITAMIN B1) injection  100 mg Intravenous Daily   Continuous Infusions:    PRN Meds:.acetaminophen **OR** acetaminophen, dextrose, [DISCONTINUED] ondansetron **OR** ondansetron (ZOFRAN) IV, mouth rinse, sorbitol  Antibiotics  :    Anti-infectives (From admission, onward)    None         Objective:   Vitals:   05/04/23 0200 05/04/23 0400 05/04/23 0754 05/04/23 1205  BP: 138/84 114/71 (!) 140/80 (!) 140/80  Pulse: (!) 103 (!) 102 (!) 104 98  Resp: 13 15 15 15   Temp:  98.4 F (36.9 C) 98.3 F (36.8 C) 97.6 F (36.4 C)  TempSrc:  Oral Oral Oral  SpO2: 96% 92% 94% 98%  Weight:  Height:        Wt Readings from Last 3 Encounters:  04/23/23 72.9 kg  03/19/21 72.9 kg  12/18/20 71.7 kg     Intake/Output Summary (Last 24 hours) at 05/04/2023 1227 Last data filed at 05/04/2023 5284 Gross per 24 hour  Intake 637.83 ml  Output 2300 ml  Net -1662.17 ml      Physical Exam  Awake Alert, extremely frail and deconditioned, chronically ill-appearing, appears older than stated age slow to respond with some confusion Symmetrical Chest wall movement, Good air movement bilaterally, CTAB RRR,No Gallops,Rubs or new Murmurs, No Parasternal Heave +ve B.Sounds, Abd Soft,  No tenderness, No rebound - guarding or rigidity. Right upper extremity significant ecchymosis and bruising       Data Review:    Recent Labs  Lab 04/30/23 0321 05/01/23 0305 05/02/23 0426 05/03/23 0423 05/04/23 0234  WBC 6.7 6.2 5.4 7.8 6.5  HGB 9.4* 7.6* 7.0* 9.6* 9.5*  HCT 27.7* 22.3* 20.9* 28.5* 27.9*  PLT 195 206 204 271 265  MCV 86.0 85.8 86.4 84.3 84.0  MCH 29.2 29.2 28.9 28.4 28.6  MCHC 33.9 34.1 33.5 33.7 34.1  RDW 15.6* 16.0* 16.0* 16.0* 15.8*  LYMPHSABS 0.7 0.7 0.5* 0.8 0.6*  MONOABS 0.8 0.7 0.4 0.5 0.5  EOSABS 0.1 0.1 0.1 0.1 0.1  BASOSABS 0.0 0.0 0.0 0.0 0.0    Recent Labs  Lab 04/28/23 0433 04/29/23 0451 04/30/23 0321 05/01/23 0305 05/02/23 0426 05/03/23 0423 05/04/23 0234  NA 139   < > 138 141 140 143 141  K 2.9*   < > 4.2 3.7 4.1 3.6 3.7  CL 103   < > 105 107 108 108 105  CO2 28   < > 23 22 19* 24 27  ANIONGAP 8   < > 10 12 13 11 9   GLUCOSE 147*   < > 286* 236* 360* 229* 263*  BUN 10   < > 20 21 20  25* 20  CREATININE 0.69   < > 1.44* 1.24* 1.32* 1.18* 1.04*  INR  --   --   --   --   --  1.0  --   TSH  --   --  0.933  --   --   --   --   BNP 168.5*  --   --   --   --  873.9* 971.2*  MG 2.0   < > 1.9 1.7 2.1 2.2 1.9  CALCIUM 8.5*   < > 8.3* 8.4* 8.2* 8.7* 8.6*   < > = values in this interval not displayed.      Recent Labs  Lab 04/28/23 0433 04/29/23 0451 04/30/23 0321 05/01/23 0305 05/02/23 0426 05/03/23 0423 05/04/23 0234  INR  --   --   --   --   --  1.0  --   TSH  --   --  0.933  --   --   --   --   BNP 168.5*  --   --   --   --  873.9* 971.2*  MG 2.0   < > 1.9 1.7 2.1 2.2 1.9  CALCIUM 8.5*   < > 8.3* 8.4* 8.2* 8.7* 8.6*   < > = values in this interval not displayed.    --------------------------------------------------------------------------------------------------------------- Lab Results  Component Value Date   CHOL 240 (H) 09/02/2017   HDL 69.90 09/02/2017   LDLCALC 149 (H) 09/02/2017   LDLDIRECT 210.4 05/14/2013    TRIG 106.0 09/02/2017  CHOLHDL 3 09/02/2017    Lab Results  Component Value Date   HGBA1C 9.9 (H) 04/24/2023   No results for input(s): "TSH", "T4TOTAL", "FREET4", "T3FREE", "THYROIDAB" in the last 72 hours.   No results for input(s): "VITAMINB12", "FOLATE", "FERRITIN", "TIBC", "IRON", "RETICCTPCT" in the last 72 hours.   ------------------------------------------------------------------------------------------------------------------ Cardiac Enzymes No results for input(s): "CKMB", "TROPONINI", "MYOGLOBIN" in the last 168 hours.  Invalid input(s): "CK"  Micro Results No results found for this or any previous visit (from the past 240 hour(s)).   Radiology Reports DG Chest Port 1 View  Result Date: 05/03/2023 CLINICAL DATA:  Shortness of breath. EXAM: PORTABLE CHEST 1 VIEW COMPARISON:  04/22/2023 FINDINGS: Slightly prominent vascular markings in the retrocardiac space. No focal lung disease. Heart and mediastinum are within normal limits. Trachea is midline. No acute bone abnormality. Negative for a pneumothorax. IMPRESSION: No acute cardiopulmonary disease. Electronically Signed   By: Richarda Overlie M.D.   On: 05/03/2023 08:13   VAS [place] UPPER EXTREMITY VENOUS DUPLEX  Result Date: 05/02/2023 UPPER VENOUS STUDY  Patient Name:  Katherine Navarro  Date of Exam:   05/02/2023 Medical Rec #: [id number]         Accession #:    [id number] Date of Birth: [birth year]         Patient Gender: F Patient Age:   34 years Exam Location:  [facility] Procedure:      VAS [place] UPPER EXTREMITY VENOUS DUPLEX Referring Phys: [facility] --------------------------------------------------------------------------------  Indications: Swelling, and bruising Anticoagulation: Patient could not externally extend or rotate arm to be properly evaluated. Limitations: Body habitus, poor ultrasound/tissue interface and rigid. Comparison Study: No prior exam. Performing Technologist: Fernande Bras  Examination  Guidelines: A complete evaluation includes B-mode imaging, spectral Doppler, color Doppler, and power Doppler as needed of all accessible portions of each vessel. Bilateral testing is considered an integral part of a complete examination. Limited examinations for reoccurring indications may be performed as noted.  Right Findings: +----------+------------+---------+-----------+----------+-------+ RIGHT     CompressiblePhasicitySpontaneousPropertiesSummary +----------+------------+---------+-----------+----------+-------+ IJV           Full       Yes       Yes                      +----------+------------+---------+-----------+----------+-------+ Subclavian    Full       Yes       Yes                      +----------+------------+---------+-----------+----------+-------+ Axillary      Full       Yes       Yes                      +----------+------------+---------+-----------+----------+-------+ Brachial      Full                                          +----------+------------+---------+-----------+----------+-------+ Cephalic      Full                                          +----------+------------+---------+-----------+----------+-------+ Basilic       Full                                          +----------+------------+---------+-----------+----------+-------+  Left Findings: +----------+------------+---------+-----------+----------+-------+ LEFT      CompressiblePhasicitySpontaneousPropertiesSummary +----------+------------+---------+-----------+----------+-------+ Subclavian               Yes       Yes                      +----------+------------+---------+-----------+----------+-------+  Summary:  Right: No evidence of deep vein thrombosis in the upper extremity. No evidence of superficial vein thrombosis in the upper extremity.  Left: No evidence of thrombosis in the subclavian.  *See table(s) above for measurements and observations.   Diagnosing physician: Gerarda Fraction Electronically signed by Gerarda Fraction on 05/02/2023 at 6:13:29 PM.    Final       Signature  -   Huey Bienenstock M.D on 05/04/2023 at 12:27 PM   -  To page go to [email or web address]

## 2023-05-04 NOTE — TOC Progression Note (Signed)
Transition of Care [facility]) - Progression Note    Patient Details  Name: SIMYAH GERICH MRN: [id number] Date of Birth: [birth year]  Transition [facility]) CM/SW Contact  Nitin Mckowen Reeves Forth, Student-Social Work Phone Number: 05/04/2023, 2:40 PM  Clinical Narrative:    MSW Intern called pt spouse about SNF placement preference. Pt spouse did not answer so MSW Intern left a voicemail with a callback number.   Expected Discharge Plan: Skilled Nursing Facility Barriers to Discharge: Continued Medical Work up, English as a second language teacher, SNF Pending bed offer  Expected Discharge Plan and Services In-house Referral: Clinical Social Work   Post Acute Care Choice: Skilled Nursing Facility Living arrangements for the past 2 months: Single Family Home                                       Social Determinants of Health (SDOH) Interventions SDOH Screenings   Food Insecurity: No Food Insecurity (04/25/2023)  Housing: Low Risk  (04/25/2023)  Transportation Needs: No Transportation Needs (04/25/2023)  Utilities: Not At Risk (04/25/2023)  Depression (PHQ2-9): Low Risk  (10/31/2019)  Tobacco Use: Low Risk  (04/22/2023)    Readmission Risk Interventions     No data to display

## 2023-05-04 NOTE — Progress Notes (Signed)
Initial Nutrition Assessment  DOCUMENTATION CODES:   Non-severe (moderate) malnutrition in context of chronic illness  INTERVENTION:  Initiate tube feeding via cortrak: Osmolite 1.5 at 55 ml/h (1320 ml per day) Start at 56ml/hr advance rate by 10ml every 8 hours, till goal rate is met Prosource TF20 60 ml daily FWF every 4 hours for a total of 900 ml of free water MVI/minerals  Provides 2060 kcal, 109 gm protein, 1005 + 900=1905 ml free water daily  Monitor magnesium, potassium, and phosphorus BID for at least 3 days, MD to replete as needed, as pt is at risk for refeeding syndrome given poor oral intake.   NUTRITION DIAGNOSIS:   Moderate Malnutrition related to chronic illness as evidenced by meal completion < 25%, moderate fat depletion, moderate muscle depletion.    GOAL:   Patient will meet greater than or equal to 90% of their needs    MONITOR:   PO intake, Supplement acceptance, Weight trends  REASON FOR ASSESSMENT:   Consult Assessment of nutrition requirement/status  ASSESSMENT:  67 y.o. F, admitted with severe hypothyroidism. PMHX: DM-1 supposed to be on insulin pump, hypothyroidism and HTN, leukopenia.  Reported to have declined oral intake. Patient awake setting up in chair. Appears older than stated age. Patient did not engage in full sentences. She answered yes to all questions,  did not engage in any full dialogue.  Unable to determine if she was drinking her supplements through patient or diet history.  RN reports that she will only take in just a few sips of supplement then not drink anymore. Due to verbal report of meal intake and visit with patient, Team reached out to and moving forward with Cortrak support.  Unable to determine accurate meal intake history due to gaps in documentation.  Admit weight: 72.9 kg Suspect taken from prior weight history. Current weight: Pending     Average Meal Intake: 10-20: 10% intake x 8 recorded  meals.  Nutritionally Relevant Medications: Scheduled Meds:   (feeding supplement) PROSource Plus  30 mL Oral BID BM   folic acid  1 mg Oral Daily   lactose free nutrition  237 mL Oral TID WC   levothyroxine  200 mcg Intravenous Daily   megestrol  40 mg Oral Daily   thiamine (VITAMIN B1) injection  100 mg Intravenous Daily    Continuous Infusions: PRN Meds:.  Labs Reviewed    NUTRITION - FOCUSED PHYSICAL EXAM:  Flowsheet Row Most Recent Value  Orbital Region Mild depletion  Upper Arm Region Mild depletion  Thoracic and Lumbar Region No depletion  Buccal Region Moderate depletion  Temple Region Moderate depletion  Clavicle and Acromion Bone Region Mild depletion  Scapular Bone Region Unable to assess  Dorsal Hand Unable to assess  Patellar Region Mild depletion  Anterior Thigh Region Mild depletion  Posterior Calf Region Mild depletion  Edema (RD Assessment) Mild  Hair Reviewed  Eyes Reviewed  Mouth Reviewed  Skin Reviewed  Nails Reviewed       Diet Order:   Diet Order             DIET DYS 3 Room service appropriate? Yes with Assist; Fluid consistency: Thin  Diet effective now                   EDUCATION NEEDS:   Not appropriate for education at this time  Skin:  Skin Assessment: Reviewed RN Assessment  Last BM:  05/04/23  Height:   Ht Readings from Last  1 Encounters:  04/23/23 5\' 3"  (1.6 m)    Weight:   Wt Readings from Last 1 Encounters:  04/23/23 72.9 kg    Ideal Body Weight:     BMI:  Body mass index is 28.47 kg/m.  Estimated Nutritional Needs:   Kcal:  2000-2300kcal  Protein:  95-110 g/day  Fluid:  1800-2200 ml/d    Jamelle Haring RDN, LDN Clinical Dietitian  RDN pager # available on Amion

## 2023-05-04 NOTE — Plan of Care (Signed)
  Problem: Skin Integrity: Goal: Risk for impaired skin integrity will decrease Outcome: Progressing   Problem: Cardiac: Goal: Ability to maintain an adequate cardiac output will improve Outcome: Progressing   Problem: Respiratory: Goal: Will regain and/or maintain adequate ventilation Outcome: Progressing   Problem: Clinical Measurements: Goal: Will remain free from infection Outcome: Progressing   Problem: Fluid Volume: Goal: Ability to maintain a balanced intake and output will improve Outcome: Not Progressing   Problem: Nutritional: Goal: Maintenance of adequate nutrition will improve Outcome: Not Progressing

## 2023-05-05 DIAGNOSIS — E440 Moderate protein-calorie malnutrition: Secondary | ICD-10-CM | POA: Insufficient documentation

## 2023-05-05 DIAGNOSIS — E039 Hypothyroidism, unspecified: Secondary | ICD-10-CM | POA: Diagnosis not present

## 2023-05-05 LAB — CBC WITH DIFFERENTIAL/PLATELET
Abs Immature Granulocytes: 0.02 10*3/uL (ref 0.00–0.07)
Basophils Absolute: 0 10*3/uL (ref 0.0–0.1)
Basophils Relative: 1 %
Eosinophils Absolute: 0 10*3/uL (ref 0.0–0.5)
Eosinophils Relative: 1 %
HCT: 30.4 % — ABNORMAL LOW (ref 36.0–46.0)
Hemoglobin: 10.3 g/dL — ABNORMAL LOW (ref 12.0–15.0)
Immature Granulocytes: 0 %
Lymphocytes Relative: 12 %
Lymphs Abs: 0.6 10*3/uL — ABNORMAL LOW (ref 0.7–4.0)
MCH: 29.5 pg (ref 26.0–34.0)
MCHC: 33.9 g/dL (ref 30.0–36.0)
MCV: 87.1 fL (ref 80.0–100.0)
Monocytes Absolute: 0.3 10*3/uL (ref 0.1–1.0)
Monocytes Relative: 7 %
Neutro Abs: 3.6 10*3/uL (ref 1.7–7.7)
Neutrophils Relative %: 79 %
Platelets: 271 10*3/uL (ref 150–400)
RBC: 3.49 MIL/uL — ABNORMAL LOW (ref 3.87–5.11)
RDW: 15.8 % — ABNORMAL HIGH (ref 11.5–15.5)
WBC: 4.6 10*3/uL (ref 4.0–10.5)
nRBC: 0 % (ref 0.0–0.2)

## 2023-05-05 LAB — GLUCOSE, CAPILLARY
Glucose-Capillary: 205 mg/dL — ABNORMAL HIGH (ref 70–99)
Glucose-Capillary: 273 mg/dL — ABNORMAL HIGH (ref 70–99)
Glucose-Capillary: 294 mg/dL — ABNORMAL HIGH (ref 70–99)
Glucose-Capillary: 343 mg/dL — ABNORMAL HIGH (ref 70–99)
Glucose-Capillary: 370 mg/dL — ABNORMAL HIGH (ref 70–99)
Glucose-Capillary: 400 mg/dL — ABNORMAL HIGH (ref 70–99)
Glucose-Capillary: 428 mg/dL — ABNORMAL HIGH (ref 70–99)

## 2023-05-05 LAB — BASIC METABOLIC PANEL
Anion gap: 10 (ref 5–15)
BUN: 21 mg/dL (ref 8–23)
CO2: 25 mmol/L (ref 22–32)
Calcium: 8.5 mg/dL — ABNORMAL LOW (ref 8.9–10.3)
Chloride: 105 mmol/L (ref 98–111)
Creatinine, Ser: 0.92 mg/dL (ref 0.44–1.00)
GFR, Estimated: >60 mL/min (ref >60)
Glucose, Bld: 443 mg/dL — ABNORMAL HIGH (ref 70–99)
Potassium: 3.4 mmol/L — ABNORMAL LOW (ref 3.5–5.1)
Sodium: 140 mmol/L (ref 135–145)

## 2023-05-05 LAB — MAGNESIUM
Magnesium: 2 mg/dL (ref 1.7–2.4)
Magnesium: 1.9 mg/dL (ref 1.7–2.4)

## 2023-05-05 LAB — PHOSPHORUS
Phosphorus: 3.2 mg/dL (ref 2.5–4.6)
Phosphorus: 4.1 mg/dL (ref 2.5–4.6)

## 2023-05-05 LAB — BRAIN NATRIURETIC PEPTIDE: B Natriuretic Peptide: 1106.7 pg/mL — ABNORMAL HIGH (ref 0.0–100.0)

## 2023-05-05 MED ORDER — GLUCERNA 1.2 CAL PO LIQD: 1000.0000 mL | ORAL | Status: DC

## 2023-05-05 MED ORDER — GLUCERNA 1.2 CAL PO LIQD
1000.0000 mL | ORAL | Status: DC
  Administered 2023-05-05 – 2023-05-08 (×4): 1000 mL
  Filled 2023-05-05 (×6): qty 1000

## 2023-05-05 MED ORDER — K PHOS MONO-SOD PHOS DI & MONO 155-852-130 MG PO TABS
250.0000 mg | ORAL_TABLET | Freq: Three times a day (TID) | ORAL | Status: AC
  Administered 2023-05-05 (×3): 250 mg via ORAL
  Filled 2023-05-05 (×3): qty 1

## 2023-05-05 MED ORDER — INSULIN GLARGINE-YFGN 100 UNIT/ML ~~LOC~~ SOLN
10.0000 [IU] | Freq: Two times a day (BID) | SUBCUTANEOUS | Status: DC
  Administered 2023-05-05 – 2023-05-07 (×5): 10 [IU] via SUBCUTANEOUS
  Filled 2023-05-05 (×8): qty 0.1

## 2023-05-05 MED ORDER — INSULIN ASPART 100 UNIT/ML IJ SOLN
0.0000 [IU] | INTRAMUSCULAR | Status: DC
  Administered 2023-05-05: 15 [IU] via SUBCUTANEOUS
  Administered 2023-05-05: 5 [IU] via SUBCUTANEOUS
  Administered 2023-05-06: 3 [IU] via SUBCUTANEOUS
  Administered 2023-05-06: 8 [IU] via SUBCUTANEOUS
  Administered 2023-05-06: 11 [IU] via SUBCUTANEOUS
  Administered 2023-05-06 (×2): 2 [IU] via SUBCUTANEOUS
  Administered 2023-05-07: 8 [IU] via SUBCUTANEOUS
  Administered 2023-05-07 (×2): 3 [IU] via SUBCUTANEOUS
  Administered 2023-05-07: 15 [IU] via SUBCUTANEOUS
  Administered 2023-05-07: 3 [IU] via SUBCUTANEOUS
  Administered 2023-05-07: 15 [IU] via SUBCUTANEOUS
  Administered 2023-05-08 (×2): 5 [IU] via SUBCUTANEOUS
  Administered 2023-05-08: 2 [IU] via SUBCUTANEOUS
  Administered 2023-05-08: 8 [IU] via SUBCUTANEOUS
  Administered 2023-05-08: 5 [IU] via SUBCUTANEOUS
  Administered 2023-05-08: 8 [IU] via SUBCUTANEOUS
  Administered 2023-05-08: 5 [IU] via SUBCUTANEOUS
  Administered 2023-05-09: 8 [IU] via SUBCUTANEOUS
  Administered 2023-05-09: 5 [IU] via SUBCUTANEOUS
  Administered 2023-05-09 (×2): 3 [IU] via SUBCUTANEOUS
  Administered 2023-05-09 (×2): 5 [IU] via SUBCUTANEOUS
  Administered 2023-05-10: 2 [IU] via SUBCUTANEOUS

## 2023-05-05 MED ORDER — INSULIN ASPART 100 UNIT/ML IJ SOLN
18.0000 [IU] | Freq: Once | INTRAMUSCULAR | Status: AC
  Administered 2023-05-05: 18 [IU] via SUBCUTANEOUS

## 2023-05-05 MED ORDER — SODIUM PHOSPHATES 45 MMOLE/15ML IV SOLN
30.0000 mmol | Freq: Once | INTRAVENOUS | Status: AC
  Administered 2023-05-05: 30 mmol via INTRAVENOUS
  Filled 2023-05-05: qty 10

## 2023-05-05 NOTE — Progress Notes (Signed)
PROGRESS NOTE                                                                                                                                                                                                             Patient Demographics:    Katherine Navarro, is a 67 y.o. female, DOB - 01-16-1956, HCW:[id number]  Outpatient Primary MD for the patient is Jarold Motto, [place]    LOS - 13  Admit date - 04/22/2023    Chief Complaint  Patient presents with   Weakness       Brief Narrative (HPI from H&P)      DM-1 supposed to be on insulin pump, hypothyroidism and HTN brought to ED with progressive sleepiness over weeks, and admitted for metabolic encephalopathy in the setting of severe hypothyroidism with elevated TSH to 44 and undetectable free T4.  She apparently was noncompliant with her Synthroid medication, head CT, EEG, ABG, ammonia level stable.  She appears to be in myxedema coma, transferred to my care on 04/24/2023.   Subjective:   Patient denies any complaints today, no significant events as discussed with staff   Assessment  & Plan :   Myxedema coma.  Due to noncompliance with Synthroid, TSH more than 40, free T4 initially undetectable, stable head CT, EEG, ammonia and ABG, no focal deficits, placed on high-dose IV Synthroid, random cortisol over 100, TSH and free T4 improving, Synthroid dose dropped on 04/25/2024, mentation gradually improving, suspect there is some underlying psych issue as well, but denies, will continue to monitor TSH, free T4 and clinically.  Megace added for better appetite.  Overall gradual but definite improvement.  AKI on CKD 3A.  - Not eating well at all, she has been adequately treated, encouraged to improve oral intake, Megace asdded, foley.  Renal function improving.  Urinary retention.  Foley Flomax, failed Foley removal, also has some constipation, bowel regimen initiated.  Monitor.   She will need voiding trial when she is more ambulatory.  Hypophosphatemia, hypokalemia.  -Replating, monitor closely as high risk for refeeding syndrome  Poor Oral intake due to #1 above.   Failure to thrive  Moderate protein calorie malnutrition -Started on tube feeding  Chronic anemia with evidence of acute worsening due to heme dilution.  No signs of GI bleeding, does have right upper extremity hematoma, replace folic  acid and thiamine as they were deficient, placed on PPI transfuse 1 unit on 05/02/2023.  Outpatient anemia workup.  Right upper arm hematoma and swelling.  Stable venous ultrasound, does have a hematoma in the right arm, patient does not recall hurting it, pain and swelling have improved, remove any IV access from the right arm, keep right arm elevated, since she has a moderate to large hematoma stop Lovenox and switch to SCDs.  Likely will resume on subcu Lovenox in 1 to 2 days if hemoglobin remained stable and hematoma continues to improve  Likely undiagnosed OSA.  Nighttime oxygen with outpatient sleep study.    DM type I.  Now every 4 hours sliding scale and monitor.  Check A1c.  Will IV fluids as oral intake is pretty poor.   CBG (last 3)  Recent Labs    05/05/23 0346 05/05/23 0818 05/05/23 1202  GLUCAP 294* 343* 400*   Lab Results  Component Value Date   HGBA1C 9.9 (H) 04/24/2023         Condition - Extremely Guarded  Family Communication  : None at bedside today  Code Status :  Full  Consults  :  None  PUD Prophylaxis :    Procedures  :     Right upper extremity venous duplex.  No blood clots.    Renal ultrasound.  Nonacute.    CT head and EEG - non acute      Disposition Plan  :    Status is: Inpatient   DVT Prophylaxis  : Lovenox stopped on 05/03/2023 due to right upper extremity hematoma.  Switch to SCDs.  Place and maintain sequential compression device Start: 05/03/23 1035  Lab Results  Component Value Date   PLT 271  05/05/2023    Diet :  Diet Order             DIET DYS 3 Room service appropriate? Yes with Assist; Fluid consistency: Thin  Diet effective now                    Inpatient Medications  Scheduled Meds:  (feeding supplement) PROSource Plus  30 mL Oral BID BM   Chlorhexidine Gluconate Cloth  6 each Topical Q0600   docusate sodium  100 mg Oral BID   feeding supplement (PROSource TF20)  60 mL Per Tube Daily   folic acid  1 mg Oral Daily   free water  150 mL Per Tube Q4H   insulin aspart  0-6 Units Subcutaneous TID WC   insulin glargine-yfgn  15 Units Subcutaneous Daily   lactose free nutrition  237 mL Oral TID WC   levothyroxine  200 mcg Intravenous Daily   megestrol  40 mg Oral Daily   multivitamin with minerals  1 tablet Oral Daily   mouth rinse  15 mL Mouth Rinse 4 times per day   pantoprazole  40 mg Oral Daily   phosphorus  250 mg Oral TID   polyethylene glycol  17 g Oral BID   tamsulosin  0.4 mg Oral Daily   thiamine (VITAMIN B1) injection  100 mg Intravenous Daily   Continuous Infusions:  feeding supplement (OSMOLITE 1.5 CAL) 35 mL/hr at 05/05/23 0500   sodium phosphate 30 mmol in dextrose 5 % 250 mL infusion 30 mmol (05/05/23 1031)     PRN Meds:.acetaminophen **OR** acetaminophen, dextrose, [DISCONTINUED] ondansetron **OR** ondansetron (ZOFRAN) IV, mouth rinse, sorbitol  Antibiotics  :    Anti-infectives (From admission, onward)  None         Objective:   Vitals:   05/05/23 0200 05/05/23 0400 05/05/23 0815 05/05/23 1202  BP: 118/77 126/79 137/75 138/73  Pulse: 100 99 100 97  Resp: 13 13 17 16   Temp:  97.8 F (36.6 C) 98.5 F (36.9 C) 98.7 F (37.1 C)  TempSrc:  Axillary Oral Oral  SpO2: 92% 94%    Weight:      Height:        Wt Readings from Last 3 Encounters:  04/23/23 72.9 kg  03/19/21 72.9 kg  12/18/20 71.7 kg     Intake/Output Summary (Last 24 hours) at 05/05/2023 1229 Last data filed at 05/05/2023 1140 Gross per 24 hour   Intake 16.5 ml  Output 2025 ml  Net -2008.5 ml      Physical Exam  Awake Alert, Oriented X 2, extremely frail, conditioned and ill-appearing, appears much older than stated age Symmetrical Chest wall movement, Good air movement bilaterally, CTAB RRR,No Gallops,Rubs or new Murmurs, No Parasternal Heave +ve B.Sounds, Abd Soft, No tenderness, No rebound - guarding or rigidity. No Cyanosis, Clubbing or edema, No new Rash or bruise         Data Review:    Recent Labs  Lab 05/01/23 0305 05/02/23 0426 05/03/23 0423 05/04/23 0234 05/05/23 1120  WBC 6.2 5.4 7.8 6.5 4.6  HGB 7.6* 7.0* 9.6* 9.5* 10.3*  HCT 22.3* 20.9* 28.5* 27.9* 30.4*  PLT 206 204 271 265 271  MCV 85.8 86.4 84.3 84.0 87.1  MCH 29.2 28.9 28.4 28.6 29.5  MCHC 34.1 33.5 33.7 34.1 33.9  RDW 16.0* 16.0* 16.0* 15.8* 15.8*  LYMPHSABS 0.7 0.5* 0.8 0.6* 0.6*  MONOABS 0.7 0.4 0.5 0.5 0.3  EOSABS 0.1 0.1 0.1 0.1 0.0  BASOSABS 0.0 0.0 0.0 0.0 0.0    Recent Labs  Lab 04/30/23 0321 05/01/23 0305 05/02/23 0426 05/03/23 0423 05/04/23 0234 05/04/23 1708  NA 138 141 140 143 141  --   K 4.2 3.7 4.1 3.6 3.7  --   CL 105 107 108 108 105  --   CO2 23 22 19* 24 27  --   ANIONGAP 10 12 13 11 9   --   GLUCOSE 286* 236* 360* 229* 263*  --   BUN 20 21 20  25* 20  --   CREATININE 1.44* 1.24* 1.32* 1.18* 1.04*  --   INR  --   --   --  1.0  --   --   TSH 0.933  --   --   --   --   --   BNP  --   --   --  873.9* 971.2*  --   MG 1.9 1.7 2.1 2.2 1.9 2.0  CALCIUM 8.3* 8.4* 8.2* 8.7* 8.6*  --       Recent Labs  Lab 04/30/23 0321 05/01/23 0305 05/02/23 0426 05/03/23 0423 05/04/23 0234 05/04/23 1708  INR  --   --   --  1.0  --   --   TSH 0.933  --   --   --   --   --   BNP  --   --   --  873.9* 971.2*  --   MG 1.9 1.7 2.1 2.2 1.9 2.0  CALCIUM 8.3* 8.4* 8.2* 8.7* 8.6*  --     --------------------------------------------------------------------------------------------------------------- Lab Results  Component Value  Date   CHOL 240 (H) 09/02/2017   HDL 69.90 09/02/2017   LDLCALC 149 (H) 09/02/2017  LDLDIRECT 210.4 05/14/2013   TRIG 106.0 09/02/2017   CHOLHDL 3 09/02/2017    Lab Results  Component Value Date   HGBA1C 9.9 (H) 04/24/2023   No results for input(s): "TSH", "T4TOTAL", "FREET4", "T3FREE", "THYROIDAB" in the last 72 hours.   No results for input(s): "VITAMINB12", "FOLATE", "FERRITIN", "TIBC", "IRON", "RETICCTPCT" in the last 72 hours.   ------------------------------------------------------------------------------------------------------------------ Cardiac Enzymes No results for input(s): "CKMB", "TROPONINI", "MYOGLOBIN" in the last 168 hours.  Invalid input(s): "CK"  Micro Results No results found for this or any previous visit (from the past 240 hour(s)).   Radiology Reports DG Abd Portable 1V  Result Date: 05/04/2023 CLINICAL DATA:  Feeding tube placement. EXAM: PORTABLE ABDOMEN - 1 VIEW COMPARISON:  Chest radiograph dated 05/03/2023. FINDINGS: Feeding tube with tip in the right upper abdomen likely in the distal stomach. IMPRESSION: Feeding tube with tip in the distal stomach. Electronically Signed   By: Elgie Collard M.D.   On: 05/04/2023 17:25   DG Chest Port 1 View  Result Date: 05/03/2023 CLINICAL DATA:  Shortness of breath. EXAM: PORTABLE CHEST 1 VIEW COMPARISON:  04/22/2023 FINDINGS: Slightly prominent vascular markings in the retrocardiac space. No focal lung disease. Heart and mediastinum are within normal limits. Trachea is midline. No acute bone abnormality. Negative for a pneumothorax. IMPRESSION: No acute cardiopulmonary disease. Electronically Signed   By: Richarda Overlie M.D.   On: 05/03/2023 08:13   VAS [place] UPPER EXTREMITY VENOUS DUPLEX  Result Date: 05/02/2023 UPPER VENOUS STUDY  Patient Name:  Katherine Navarro  Date of Exam:   05/02/2023 Medical Rec #: [id number]         Accession #:    [id number] Date of Birth: 1955/10/18         Patient Gender: F Patient  Age:   36 years Exam Location:  [facility] Procedure:      VAS [place] UPPER EXTREMITY VENOUS DUPLEX Referring Phys: Bess Harvest [facility] --------------------------------------------------------------------------------  Indications: Swelling, and bruising Anticoagulation: Patient could not externally extend or rotate arm to be properly evaluated. Limitations: Body habitus, poor ultrasound/tissue interface and rigid. Comparison Study: No prior exam. Performing Technologist: Fernande Bras  Examination Guidelines: A complete evaluation includes B-mode imaging, spectral Doppler, color Doppler, and power Doppler as needed of all accessible portions of each vessel. Bilateral testing is considered an integral part of a complete examination. Limited examinations for reoccurring indications may be performed as noted.  Right Findings: +----------+------------+---------+-----------+----------+-------+ RIGHT     CompressiblePhasicitySpontaneousPropertiesSummary +----------+------------+---------+-----------+----------+-------+ IJV           Full       Yes       Yes                      +----------+------------+---------+-----------+----------+-------+ Subclavian    Full       Yes       Yes                      +----------+------------+---------+-----------+----------+-------+ Axillary      Full       Yes       Yes                      +----------+------------+---------+-----------+----------+-------+ Brachial      Full                                          +----------+------------+---------+-----------+----------+-------+  Cephalic      Full                                          +----------+------------+---------+-----------+----------+-------+ Basilic       Full                                          +----------+------------+---------+-----------+----------+-------+  Left Findings: +----------+------------+---------+-----------+----------+-------+ LEFT       CompressiblePhasicitySpontaneousPropertiesSummary +----------+------------+---------+-----------+----------+-------+ Subclavian               Yes       Yes                      +----------+------------+---------+-----------+----------+-------+  Summary:  Right: No evidence of deep vein thrombosis in the upper extremity. No evidence of superficial vein thrombosis in the upper extremity.  Left: No evidence of thrombosis in the subclavian.  *See table(s) above for measurements and observations.  Diagnosing physician: Gerarda Fraction Electronically signed by Gerarda Fraction on 05/02/2023 at 6:13:29 PM.    Final       Signature  -   Huey Bienenstock M.D on 05/05/2023 at 12:29 PM   -  To page go to [email or web address]

## 2023-05-05 NOTE — TOC Progression Note (Addendum)
Transition of Care [facility]) - Progression Note    Patient Details  Name: Katherine Navarro MRN: [id number] Date of Birth: 02-09-1956  [facility]) CM/SW Contact  Michaela Corner, [place] Phone Number: 05/05/2023, 9:06 AM  Clinical Narrative:   CSW called pt spouse about SNF placement preference. Pts spouse did not answer, CSW left a voicemail with a callback number.   12:22PM: CSW tried to call spouse again - no response.  3:42PM:  CSW tried to call spouse and got his vm.  Expected Discharge Plan: Skilled Nursing Facility Barriers to Discharge: Continued Medical Work up, English as a second language teacher, SNF Pending bed offer  Expected Discharge Plan and Services In-house Referral: Clinical Social Work   Post Acute Care Choice: Skilled Nursing Facility Living arrangements for the past 2 months: Single Family Home                                       Social Determinants of Health (SDOH) Interventions SDOH Screenings   Food Insecurity: No Food Insecurity (04/25/2023)  Housing: Low Risk  (04/25/2023)  Transportation Needs: No Transportation Needs (04/25/2023)  Utilities: Not At Risk (04/25/2023)  Depression (PHQ2-9): Low Risk  (10/31/2019)  Tobacco Use: Low Risk  (04/22/2023)    Readmission Risk Interventions     No data to display

## 2023-05-05 NOTE — Plan of Care (Signed)

## 2023-05-05 NOTE — Progress Notes (Signed)
Occupational Therapy Treatment Patient Details Name: Katherine Navarro MRN: [id number] DOB: Jun 19, 1956 Today's Date: 05/05/2023   History of present illness Pt is a 67 y/o F presenting to ED on 11/1 with progressive sleepiness, CT head negative, Admitted for metabolic encephalopathy in setting of hypothyroidism with elevated TSH. PMH includes DM1, hypothyroidism, HTN, diabetic retinopathy   OT comments  Pt c/o cortrak throughout session, stating she wants it removed. She remains confused and not able to recall therapists names despite several cues throughout session, she was able to complete seated exercises to progressively get BLEs stronger in prep for OOB ADLs, still unable to produce steps. Sitting balance getting better but cognition not fully there to appropriately complete seated ADLs. OT to continue to progress pt as able, DC plans remain appropriate for SNF.       If plan is discharge home, recommend the following:  Two people to help with walking and/or transfers;A lot of help with bathing/dressing/bathroom;Assistance with cooking/housework;Direct supervision/assist for medications management;Direct supervision/assist for financial management;Assist for transportation;Help with stairs or ramp for entrance;Supervision due to cognitive status   Equipment Recommendations  Other (comment) (defer)    Recommendations for Other Services      Precautions / Restrictions Precautions Precautions: Fall Restrictions Weight Bearing Restrictions: No       Mobility Bed Mobility Overal bed mobility: Needs Assistance Bed Mobility: Sit to Supine       Sit to supine: Max assist, +2 for physical assistance   General bed mobility comments: assist for trunk and BLE control    Transfers Overall transfer level: Needs assistance Equipment used: Rolling walker (2 wheels) Transfers: Bed to chair/wheelchair/BSC, Sit to/from Stand Sit to Stand: Max assist Stand pivot transfers: Max assist          General transfer comment: Upon standing EOB attempted to have pt perform lateral side steps, performed two but they were too uncoordinated to safely continue while OT maintained balance     Balance Overall balance assessment: Needs assistance Sitting-balance support: No upper extremity supported, Feet supported Sitting balance-Leahy Scale: Fair Sitting balance - Comments: Pt progressed from Mod A to CGA for static sitting following cues to sit upright and OT placing her in an optimal position   Standing balance support: Bilateral upper extremity supported Standing balance-Leahy Scale: Poor                             ADL either performed or assessed with clinical judgement   ADL Overall ADL's : Needs assistance/impaired   Eating/Feeding Details (indicate cue type and reason): Pt on tube feeds, unsure of oral intake but tray in her room         Lower Body Bathing: Sitting/lateral leans;Maximal assistance Lower Body Bathing Details (indicate cue type and reason): Max A hand over hand assist from OT to use RUE/LUE to wash BLEs Upper Body Dressing : Maximal assistance;Bed level Upper Body Dressing Details (indicate cue type and reason): Follows cues to raise arms, does not pull them through herself                   General ADL Comments: Dynamic reaching sitting EOB with variable assist, needing OT to faciliated UE reaching to engage core musclses for strengthening.    Extremity/Trunk Assessment              Vision       Perception     Praxis  Cognition Arousal: Alert Behavior During Therapy: Flat affect Overall Cognitive Status: No family/caregiver present to determine baseline cognitive functioning                                 General Comments: Pt following commands inconsistently, needs repetitive cueing to engage in task at hand.        Exercises General Exercises - Lower Extremity Short Arc Quad: AROM, Seated,  Both, 10 reps, Strengthening Hip Flexion/Marching: Seated, Strengthening, Both, AROM, 10 reps    Shoulder Instructions       General Comments VSS    Pertinent Vitals/ Pain       Pain Assessment Pain Assessment: Faces Faces Pain Scale: No hurt  Home Living                                          Prior Functioning/Environment              Frequency  Min 1X/week        Progress Toward Goals  OT Goals(current goals can now be found in the care plan section)  Progress towards OT goals: Progressing toward goals  Acute Rehab OT Goals OT Goal Formulation: With patient Time For Goal Achievement: 05/19/23 Potential to Achieve Goals: Good ADL Goals Pt Will Perform Grooming: sitting;with supervision Pt Will Transfer to Toilet: stand pivot transfer;with min assist;bedside commode  Plan      Co-evaluation                 AM-PAC OT "6 Clicks" Daily Activity     Outcome Measure   Help from another person eating meals?: A Little Help from another person taking care of personal grooming?: A Little Help from another person toileting, which includes using toliet, bedpan, or urinal?: A Lot Help from another person bathing (including washing, rinsing, drying)?: A Lot Help from another person to put on and taking off regular upper body clothing?: A Lot Help from another person to put on and taking off regular lower body clothing?: A Lot 6 Click Score: 14    End of Session    OT Visit Diagnosis: Unsteadiness on feet (R26.81);Other abnormalities of gait and mobility (R26.89);Muscle weakness (generalized) (M62.81)   Activity Tolerance Patient tolerated treatment well   Patient Left with call bell/phone within reach;in bed;with bed alarm set   Nurse Communication Mobility status        Time: 4098-1191 OT Time Calculation (min): 31 min  Charges: OT General Charges $OT Visit: 1 Visit OT Treatments $Therapeutic Activity: 23-37  mins  05/05/2023  AB, OTR/L  Acute Rehabilitation Services  Office: [phone]   Tristan Schroeder 05/05/2023, 3:13 PM

## 2023-05-06 DIAGNOSIS — E039 Hypothyroidism, unspecified: Secondary | ICD-10-CM | POA: Diagnosis not present

## 2023-05-06 LAB — GLUCOSE, CAPILLARY
Glucose-Capillary: 168 mg/dL — ABNORMAL HIGH (ref 70–99)
Glucose-Capillary: 142 mg/dL — ABNORMAL HIGH (ref 70–99)
Glucose-Capillary: 144 mg/dL — ABNORMAL HIGH (ref 70–99)
Glucose-Capillary: 139 mg/dL — ABNORMAL HIGH (ref 70–99)
Glucose-Capillary: 320 mg/dL — ABNORMAL HIGH (ref 70–99)
Glucose-Capillary: 270 mg/dL — ABNORMAL HIGH (ref 70–99)
Glucose-Capillary: 198 mg/dL — ABNORMAL HIGH (ref 70–99)
Glucose-Capillary: 214 mg/dL — ABNORMAL HIGH (ref 70–99)

## 2023-05-06 LAB — CBC WITH DIFFERENTIAL/PLATELET
Abs Immature Granulocytes: 0.03 10*3/uL (ref 0.00–0.07)
Basophils Absolute: 0 10*3/uL (ref 0.0–0.1)
Basophils Relative: 0 %
Eosinophils Absolute: 0 10*3/uL (ref 0.0–0.5)
Eosinophils Relative: 1 %
HCT: 25.7 % — ABNORMAL LOW (ref 36.0–46.0)
Hemoglobin: 8.5 g/dL — ABNORMAL LOW (ref 12.0–15.0)
Immature Granulocytes: 1 %
Lymphocytes Relative: 17 %
Lymphs Abs: 0.8 10*3/uL (ref 0.7–4.0)
MCH: 28.4 pg (ref 26.0–34.0)
MCHC: 33.1 g/dL (ref 30.0–36.0)
MCV: 86 fL (ref 80.0–100.0)
Monocytes Absolute: 0.4 10*3/uL (ref 0.1–1.0)
Monocytes Relative: 9 %
Neutro Abs: 3.5 10*3/uL (ref 1.7–7.7)
Neutrophils Relative %: 72 %
Platelets: 223 10*3/uL (ref 150–400)
RBC: 2.99 MIL/uL — ABNORMAL LOW (ref 3.87–5.11)
RDW: 15.8 % — ABNORMAL HIGH (ref 11.5–15.5)
WBC: 4.8 10*3/uL (ref 4.0–10.5)
nRBC: 0 % (ref 0.0–0.2)

## 2023-05-06 LAB — BASIC METABOLIC PANEL
Anion gap: 11 (ref 5–15)
BUN: 35 mg/dL — ABNORMAL HIGH (ref 8–23)
CO2: 29 mmol/L (ref 22–32)
Calcium: 8.2 mg/dL — ABNORMAL LOW (ref 8.9–10.3)
Chloride: 104 mmol/L (ref 98–111)
Creatinine, Ser: 0.99 mg/dL (ref 0.44–1.00)
GFR, Estimated: >60 mL/min (ref >60)
Glucose, Bld: 150 mg/dL — ABNORMAL HIGH (ref 70–99)
Potassium: 3.1 mmol/L — ABNORMAL LOW (ref 3.5–5.1)
Sodium: 144 mmol/L (ref 135–145)

## 2023-05-06 LAB — BRAIN NATRIURETIC PEPTIDE: B Natriuretic Peptide: 665.9 pg/mL — ABNORMAL HIGH (ref 0.0–100.0)

## 2023-05-06 LAB — MAGNESIUM: Magnesium: 1.9 mg/dL (ref 1.7–2.4)

## 2023-05-06 LAB — PHOSPHORUS: Phosphorus: 3.1 mg/dL (ref 2.5–4.6)

## 2023-05-06 MED ORDER — POTASSIUM CHLORIDE 20 MEQ PO PACK
40.0000 meq | PACK | Freq: Once | ORAL | Status: AC
  Administered 2023-05-06: 40 meq
  Filled 2023-05-06: qty 2

## 2023-05-06 MED ORDER — LEVOTHYROXINE SODIUM 100 MCG PO TABS
200.0000 ug | ORAL_TABLET | Freq: Every day | ORAL | Status: DC
  Administered 2023-05-06 – 2023-05-07 (×2): 200 ug
  Filled 2023-05-06 (×2): qty 2

## 2023-05-06 NOTE — Progress Notes (Signed)
Physical Therapy Treatment Patient Details Name: Katherine Navarro MRN: [id number] DOB: 1955-08-06 Today's Date: 05/06/2023   History of Present Illness Pt is a 67 y/o F presenting to ED on 11/1 with progressive sleepiness, CT head negative, Admitted for metabolic encephalopathy in setting of hypothyroidism with elevated TSH. PMH includes DM1, hypothyroidism, HTN, diabetic retinopathy    PT Comments  Pt received sitting in the recliner and alert with stimulation. Pt demonstrates difficulty following commands and requires significant assist for all mobility tasks due to lack of initiation. Pt able to participate in BLE exercise, however stops participating after 3 reps on each LE despite dense cues, question fatigue vs attention. Pt requires max A +2 to stand from recliner for pad adjustment and is unable to follow cues for hand placement or sequencing. Pt continues to benefit from PT services to progress toward functional mobility goals.    If plan is discharge home, recommend the following: Assistance with cooking/housework;Assistance with feeding;Help with stairs or ramp for entrance;Direct supervision/assist for medications management;Direct supervision/assist for financial management;Assist for transportation;Supervision due to cognitive status;Two people to help with walking and/or transfers   Can travel by private vehicle     No  Equipment Recommendations  None recommended by PT    Recommendations for Other Services       Precautions / Restrictions Precautions Precautions: Fall Precaution Comments: cortrak Restrictions Weight Bearing Restrictions: No     Mobility  Bed Mobility               General bed mobility comments: Pt up in recliner upon entry    Transfers Overall transfer level: Needs assistance Equipment used: Rolling walker (2 wheels) Transfers: Sit to/from Stand Sit to Stand: Max assist, +2 physical assistance           General transfer comment: Max  A +2 to stand from recliner with pt unable to reach a full upright posture. Dense cues for anterior lean and hand placement, but pt not following         Balance Overall balance assessment: Needs assistance Sitting-balance support: No upper extremity supported, Feet supported Sitting balance-Leahy Scale: Fair Sitting balance - Comments: pt in recliner and requires assist for anterior lean and is able to hold briefly, however returns to posterior lean with increased fatigue   Standing balance support: Bilateral upper extremity supported, During functional activity Standing balance-Leahy Scale: Zero Standing balance comment: Pt requires +2 assist to maintain standing                            Cognition Arousal: Alert Behavior During Therapy: Flat affect Overall Cognitive Status: No family/caregiver present to determine baseline cognitive functioning                                 General Comments: pt requires dense cues with pt demonstrating impaired initiation and processing        Exercises General Exercises - Lower Extremity Long Arc Quad: AROM, Seated, Both, 5 reps, AAROM (x2)    General Comments        Pertinent Vitals/Pain Pain Assessment Pain Assessment: Faces Faces Pain Scale: Hurts a little bit Pain Location: generalized with mobility Pain Descriptors / Indicators: Guarding, Grimacing Pain Intervention(s): Monitored during session, Limited activity within patient's tolerance, Repositioned     PT Goals (current goals can now be found in the care plan section) Acute  Rehab PT Goals Patient Stated Goal: none stated PT Goal Formulation: With patient/family Time For Goal Achievement: 05/07/23 Progress towards PT goals: Not progressing toward goals - comment (limited by impaired cognition)    Frequency    Min 1X/week       AM-PAC PT "6 Clicks" Mobility   Outcome Measure  Help needed turning from your back to your side while in  a flat bed without using bedrails?: Total Help needed moving from lying on your back to sitting on the side of a flat bed without using bedrails?: Total Help needed moving to and from a bed to a chair (including a wheelchair)?: Total Help needed standing up from a chair using your arms (e.g., wheelchair or bedside chair)?: Total Help needed to walk in hospital room?: Total Help needed climbing 3-5 steps with a railing? : Total 6 Click Score: 6    End of Session Equipment Utilized During Treatment: Gait belt Activity Tolerance: Patient limited by fatigue Patient left: in chair;with chair alarm set;with call bell/phone within reach Nurse Communication: Mobility status PT Visit Diagnosis: Other abnormalities of gait and mobility (R26.89);Muscle weakness (generalized) (M62.81);Difficulty in walking, not elsewhere classified (R26.2)     Time: 7829-5621 PT Time Calculation (min) (ACUTE ONLY): 17 min  Charges:    $Therapeutic Exercise: 8-22 mins PT General Charges $$ ACUTE PT VISIT: 1 Visit                     Johny Shock, PTA Acute Rehabilitation Services Secure Chat Preferred  [phone]    Johny Shock 05/06/2023, 12:50 PM

## 2023-05-06 NOTE — Plan of Care (Signed)
Pt remains on RA. Tolerating tube feedings. Foley catheter maintained. Turned q2 hours. Family at bedside during shift and updated via bedside nurse.   Problem: Education: Goal: Ability to describe self-care measures that may prevent or decrease complications (Diabetes Survival Skills Education) will improve Outcome: Progressing Goal: Individualized Educational Video(s) Outcome: Progressing   Problem: Coping: Goal: Ability to adjust to condition or change in health will improve Outcome: Progressing   Problem: Fluid Volume: Goal: Ability to maintain a balanced intake and output will improve Outcome: Progressing   Problem: Health Behavior/Discharge Planning: Goal: Ability to identify and utilize available resources and services will improve Outcome: Progressing Goal: Ability to manage health-related needs will improve Outcome: Progressing   Problem: Metabolic: Goal: Ability to maintain appropriate glucose levels will improve Outcome: Progressing   Problem: Nutritional: Goal: Maintenance of adequate nutrition will improve Outcome: Progressing Goal: Progress toward achieving an optimal weight will improve Outcome: Progressing   Problem: Skin Integrity: Goal: Risk for impaired skin integrity will decrease Outcome: Progressing   Problem: Tissue Perfusion: Goal: Adequacy of tissue perfusion will improve Outcome: Progressing   Problem: Education: Goal: Ability to describe self-care measures that may prevent or decrease complications (Diabetes Survival Skills Education) will improve Outcome: Progressing Goal: Individualized Educational Video(s) Outcome: Progressing   Problem: Cardiac: Goal: Ability to maintain an adequate cardiac output will improve Outcome: Progressing   Problem: Health Behavior/Discharge Planning: Goal: Ability to identify and utilize available resources and services will improve Outcome: Progressing Goal: Ability to manage health-related needs will  improve Outcome: Progressing   Problem: Fluid Volume: Goal: Ability to achieve a balanced intake and output will improve Outcome: Progressing   Problem: Metabolic: Goal: Ability to maintain appropriate glucose levels will improve Outcome: Progressing   Problem: Nutritional: Goal: Maintenance of adequate nutrition will improve Outcome: Progressing Goal: Maintenance of adequate weight for body size and type will improve Outcome: Progressing   Problem: Respiratory: Goal: Will regain and/or maintain adequate ventilation Outcome: Progressing   Problem: Urinary Elimination: Goal: Ability to achieve and maintain adequate renal perfusion and functioning will improve Outcome: Progressing   Problem: Education: Goal: Knowledge of General Education information will improve Description: Including pain rating scale, medication(s)/side effects and non-pharmacologic comfort measures Outcome: Progressing   Problem: Health Behavior/Discharge Planning: Goal: Ability to manage health-related needs will improve Outcome: Progressing   Problem: Clinical Measurements: Goal: Ability to maintain clinical measurements within normal limits will improve Outcome: Progressing Goal: Will remain free from infection Outcome: Progressing Goal: Diagnostic test results will improve Outcome: Progressing Goal: Respiratory complications will improve Outcome: Progressing Goal: Cardiovascular complication will be avoided Outcome: Progressing   Problem: Activity: Goal: Risk for activity intolerance will decrease Outcome: Progressing   Problem: Nutrition: Goal: Adequate nutrition will be maintained Outcome: Progressing   Problem: Coping: Goal: Level of anxiety will decrease Outcome: Progressing   Problem: Elimination: Goal: Will not experience complications related to bowel motility Outcome: Progressing Goal: Will not experience complications related to urinary retention Outcome: Progressing    Problem: Pain Management: Goal: General experience of comfort will improve Outcome: Progressing   Problem: Safety: Goal: Ability to remain free from injury will improve Outcome: Progressing   Problem: Skin Integrity: Goal: Risk for impaired skin integrity will decrease Outcome: Progressing

## 2023-05-06 NOTE — Progress Notes (Signed)
Nutrition Follow-up  DOCUMENTATION CODES:   Non-severe (moderate) malnutrition in context of chronic illness  INTERVENTION:  Glucerna 1.2 at 55 ml/h (1320 ml per day) Start at 43ml/hr advance rate by 10ml every 8 hours, till goal rate is met Prosource TF20 60 ml daily FWF every 4 hours for a total of 900 ml of free water MVI/minerals   Provides 1664 kcal, 99 gm protein, 1063+ 900=1963 ml free water daily  Monitor magnesium, potassium, and phosphorus BID for at least 3 days, MD to replete as needed, as pt is at risk for refeeding syndrome given poor oral intake.  Continue with DYS 3 diet.  NUTRITION DIAGNOSIS:   Moderate Malnutrition related to chronic illness as evidenced by meal completion < 25%, moderate fat depletion, moderate muscle depletion.    GOAL:   Patient will meet greater than or equal to 90% of their needs    MONITOR:   PO intake, Supplement acceptance, Weight trends  REASON FOR ASSESSMENT:   Consult Assessment of nutrition requirement/status  ASSESSMENT:   67 y.o. F, admitted with severe hypothyroidism. PMHX: DM-1 supposed to be on insulin pump, hypothyroidism and HTN, leukopenia.  Reported to have declined oral intake.  Patient setting up at bed side with cortrak running a goal rate.  Tolerating well. Breakfast tray untouched. When asked she questioned I have not ate breakfast  well give it to me so I can eat. MD and RN reported that the RN attempted to feed her breakfast and she refused.  They also reported that her husband can not get her to eat either.  EN support needs to continue in relation to poor oral intake and confusion  NUTRITION - FOCUSED PHYSICAL EXAM:  Flowsheet Row Most Recent Value  Orbital Region Mild depletion  Upper Arm Region Mild depletion  Thoracic and Lumbar Region No depletion  Buccal Region Moderate depletion  Temple Region Moderate depletion  Clavicle and Acromion Bone Region Mild depletion  Scapular Bone Region  Unable to assess  Dorsal Hand Unable to assess  Patellar Region Mild depletion  Anterior Thigh Region Mild depletion  Posterior Calf Region Mild depletion  Edema (RD Assessment) Mild  Hair Reviewed  Eyes Reviewed  Mouth Reviewed  Skin Reviewed  Nails Reviewed       Diet Order:   Diet Order             DIET DYS 3 Room service appropriate? Yes with Assist; Fluid consistency: Thin  Diet effective now                   EDUCATION NEEDS:   Not appropriate for education at this time  Skin:  Skin Assessment: Reviewed RN Assessment  Last BM:  05/04/23  Height:   Ht Readings from Last 1 Encounters:  04/23/23 5\' 3"  (1.6 m)    Weight:   Wt Readings from Last 1 Encounters:  05/06/23 67.4 kg    Ideal Body Weight:     BMI:  Body mass index is 26.32 kg/m.  Estimated Nutritional Needs:   Kcal:  2000-2300kcal  Protein:  95-110 g/day  Fluid:  1800-2200 ml/d    Jamelle Haring RDN, LDN Clinical Dietitian  RDN pager # available on Amion

## 2023-05-06 NOTE — Progress Notes (Signed)
PROGRESS NOTE                                                                                                                                                                                                             Patient Demographics:    Katherine Navarro, is a 67 y.o. female, DOB - 1956-03-07, [id number]  Outpatient Primary MD for the patient is Jarold Motto, [place]    LOS - 14  Admit date - 04/22/2023    Chief Complaint  Patient presents with   Weakness       Brief Narrative (HPI from H&P)      DM-1 supposed to be on insulin pump, hypothyroidism and HTN brought to ED with progressive sleepiness over weeks, and admitted for metabolic encephalopathy in the setting of severe hypothyroidism with elevated TSH to 44 and undetectable free T4.  She apparently was noncompliant with her Synthroid medication, head CT, EEG, ABG, ammonia level stable.  She appears to be in myxedema coma, failure to thrive, improving with IV Synthroid, but mentation not back to baseline, she remained with significant poor oral intake and failure to thrive.   Subjective:   Patient denies any complaints today, no significant events as discussed with staff   Assessment  & Plan :   Myxedema coma.  Due to noncompliance with Synthroid, TSH more than 40, free T4 initially undetectable, stable head CT, EEG, ammonia and ABG, no focal deficits, placed on high-dose IV Synthroid, random cortisol over 100, TSH and free T4 improving, Synthroid dose dropped on 04/25/2024, mentation gradually improving, suspect there is some underlying psych issue as well, but denies, will continue to monitor TSH, free T4 and clinically.  Megace added for better appetite.  Overall gradual but definite improvement. -He is on IV Synthroid, will change to p.o. today.  AKI on CKD 3A.  - Not eating well at all, she has been adequately treated, encouraged to improve oral intake,  Megace asdded, foley.  Renal function improving.  Urinary retention.  Foley Flomax, failed Foley removal, also has some constipation, bowel regimen initiated.  Monitor.  She will need voiding trial when she is more ambulatory.  Hypophosphatemia, hypokalemia.  -Replating, monitor closely as high risk for refeeding syndrome  Poor Oral intake due to #1 above.   Failure to thrive  Moderate protein calorie malnutrition -Started on  tube feeding  Chronic anemia with evidence of acute worsening due to heme dilution.  No signs of GI bleeding, does have right upper extremity hematoma, replace folic acid and thiamine as they were deficient, placed on PPI transfuse 1 unit on 05/02/2023.  Outpatient anemia workup.  Right upper arm hematoma and swelling.  Stable venous ultrasound, does have a hematoma in the right arm, patient does not recall hurting it, pain and swelling have improved, remove any IV access from the right arm, keep right arm elevated, since she has a moderate to large hematoma stop Lovenox and switch to SCDs.  Likely will resume on subcu Lovenox in 1 to 2 days if hemoglobin remained stable and hematoma continues to improve  Likely undiagnosed OSA.  Nighttime oxygen with outpatient sleep study.    DM type I.  CBG significantly uncontrolled yesterday, much improved after changing her tube feed to Glucerna, and changing her sliding scale to every 4 hours and ending her Semglee to 10 units twice daily.   CBG (last 3)  Recent Labs    05/06/23 0644 05/06/23 0738 05/06/23 1132  GLUCAP 144* 139* 320*   Lab Results  Component Value Date   HGBA1C 9.9 (H) 04/24/2023         Condition - Extremely Guarded  Family Communication  : None at bedside today  Code Status :  Full  Consults  :  None  PUD Prophylaxis :    Procedures  :     Right upper extremity venous duplex.  No blood clots.    Renal ultrasound.  Nonacute.    CT head and EEG - non acute      Disposition Plan  :     Status is: Inpatient   DVT Prophylaxis  : Lovenox stopped on 05/03/2023 due to right upper extremity hematoma.  Switch to SCDs.  Place and maintain sequential compression device Start: 05/03/23 1035  Lab Results  Component Value Date   PLT 223 05/06/2023    Diet :  Diet Order             DIET DYS 3 Room service appropriate? Yes with Assist; Fluid consistency: Thin  Diet effective now                    Inpatient Medications  Scheduled Meds:  (feeding supplement) PROSource Plus  30 mL Oral BID BM   Chlorhexidine Gluconate Cloth  6 each Topical Q0600   docusate sodium  100 mg Oral BID   feeding supplement (PROSource TF20)  60 mL Per Tube Daily   folic acid  1 mg Oral Daily   free water  150 mL Per Tube Q4H   insulin aspart  0-15 Units Subcutaneous Q4H   insulin glargine-yfgn  10 Units Subcutaneous BID   lactose free nutrition  237 mL Oral TID WC   levothyroxine  200 mcg Per Tube Q0600   megestrol  40 mg Oral Daily   multivitamin with minerals  1 tablet Oral Daily   mouth rinse  15 mL Mouth Rinse 4 times per day   pantoprazole  40 mg Oral Daily   polyethylene glycol  17 g Oral BID   tamsulosin  0.4 mg Oral Daily   thiamine (VITAMIN B1) injection  100 mg Intravenous Daily   Continuous Infusions:  feeding supplement (GLUCERNA 1.2 CAL) 1,000 mL (05/05/23 2150)     PRN Meds:.acetaminophen **OR** acetaminophen, dextrose, [DISCONTINUED] ondansetron **OR** ondansetron (ZOFRAN) IV, mouth rinse, sorbitol  Antibiotics  :  Anti-infectives (From admission, onward)    None         Objective:   Vitals:   05/06/23 0000 05/06/23 0348 05/06/23 0736 05/06/23 1130  BP:  (!) 114/53 (!) 98/54 (!) 105/57  Pulse:   84 98  Resp:   16 15  Temp: 98.1 F (36.7 C) 99.1 F (37.3 C) 99.1 F (37.3 C) 98.9 F (37.2 C)  TempSrc: Oral Oral Oral Oral  SpO2:      Weight:  67.4 kg    Height:        Wt Readings from Last 3 Encounters:  05/06/23 67.4 kg  03/19/21 72.9  kg  12/18/20 71.7 kg     Intake/Output Summary (Last 24 hours) at 05/06/2023 1329 Last data filed at 05/06/2023 0004 Gross per 24 hour  Intake --  Output 1075 ml  Net -1075 ml      Physical Exam  Awake Alert, Oriented X 2, significantly frail, deconditioned. Symmetrical Chest wall movement, Good air movement bilaterally, CTAB RRR,No Gallops,Rubs or new Murmurs, No Parasternal Heave +ve B.Sounds, Abd Soft, No tenderness, No rebound - guarding or rigidity. No Cyanosis, Clubbing or edema, No new Rash or bruise         Data Review:    Recent Labs  Lab 05/02/23 0426 05/03/23 0423 05/04/23 0234 05/05/23 1120 05/06/23 0551  WBC 5.4 7.8 6.5 4.6 4.8  HGB 7.0* 9.6* 9.5* 10.3* 8.5*  HCT 20.9* 28.5* 27.9* 30.4* 25.7*  PLT 204 271 265 271 223  MCV 86.4 84.3 84.0 87.1 86.0  MCH 28.9 28.4 28.6 29.5 28.4  MCHC 33.5 33.7 34.1 33.9 33.1  RDW 16.0* 16.0* 15.8* 15.8* 15.8*  LYMPHSABS 0.5* 0.8 0.6* 0.6* 0.8  MONOABS 0.4 0.5 0.5 0.3 0.4  EOSABS 0.1 0.1 0.1 0.0 0.0  BASOSABS 0.0 0.0 0.0 0.0 0.0    Recent Labs  Lab 04/30/23 0321 05/01/23 0305 05/02/23 0426 05/03/23 0423 05/04/23 0234 05/04/23 1708 05/05/23 1120 05/05/23 1822 05/06/23 0551  NA 138   < > 140 143 141  --  140  --  144  K 4.2   < > 4.1 3.6 3.7  --  3.4*  --  3.1*  CL 105   < > 108 108 105  --  105  --  104  CO2 23   < > 19* 24 27  --  25  --  29  ANIONGAP 10   < > 13 11 9   --  10  --  11  GLUCOSE 286*   < > 360* 229* 263*  --  443*  --  150*  BUN 20   < > 20 25* 20  --  21  --  35*  CREATININE 1.44*   < > 1.32* 1.18* 1.04*  --  0.92  --  0.99  INR  --   --   --  1.0  --   --   --   --   --   TSH 0.933  --   --   --   --   --   --   --   --   BNP  --   --   --  873.9* 971.2*  --  1,106.7*  --  665.9*  MG 1.9   < > 2.1 2.2 1.9 2.0 2.0 1.9 1.9  CALCIUM 8.3*   < > 8.2* 8.7* 8.6*  --  8.5*  --  8.2*   < > = values in this interval  not displayed.      Recent Labs  Lab 04/30/23 0321 05/01/23 0305  05/02/23 0426 05/03/23 0423 05/04/23 0234 05/04/23 1708 05/05/23 1120 05/05/23 1822 05/06/23 0551  INR  --   --   --  1.0  --   --   --   --   --   TSH 0.933  --   --   --   --   --   --   --   --   BNP  --   --   --  873.9* 971.2*  --  1,106.7*  --  665.9*  MG 1.9   < > 2.1 2.2 1.9 2.0 2.0 1.9 1.9  CALCIUM 8.3*   < > 8.2* 8.7* 8.6*  --  8.5*  --  8.2*   < > = values in this interval not displayed.    --------------------------------------------------------------------------------------------------------------- Lab Results  Component Value Date   CHOL 240 (H) 09/02/2017   HDL 69.90 09/02/2017   LDLCALC 149 (H) 09/02/2017   LDLDIRECT 210.4 05/14/2013   TRIG 106.0 09/02/2017   CHOLHDL 3 09/02/2017    Lab Results  Component Value Date   HGBA1C 9.9 (H) 04/24/2023   No results for input(s): "TSH", "T4TOTAL", "FREET4", "T3FREE", "THYROIDAB" in the last 72 hours.   No results for input(s): "VITAMINB12", "FOLATE", "FERRITIN", "TIBC", "IRON", "RETICCTPCT" in the last 72 hours.   ------------------------------------------------------------------------------------------------------------------ Cardiac Enzymes No results for input(s): "CKMB", "TROPONINI", "MYOGLOBIN" in the last 168 hours.  Invalid input(s): "CK"  Micro Results No results found for this or any previous visit (from the past 240 hour(s)).   Radiology Reports DG Abd Portable 1V  Result Date: 05/04/2023 CLINICAL DATA:  Feeding tube placement. EXAM: PORTABLE ABDOMEN - 1 VIEW COMPARISON:  Chest radiograph dated 05/03/2023. FINDINGS: Feeding tube with tip in the right upper abdomen likely in the distal stomach. IMPRESSION: Feeding tube with tip in the distal stomach. Electronically Signed   By: Elgie Collard M.D.   On: 05/04/2023 17:25   DG Chest Port 1 View  Result Date: 05/03/2023 CLINICAL DATA:  Shortness of breath. EXAM: PORTABLE CHEST 1 VIEW COMPARISON:  04/22/2023 FINDINGS: Slightly prominent vascular  markings in the retrocardiac space. No focal lung disease. Heart and mediastinum are within normal limits. Trachea is midline. No acute bone abnormality. Negative for a pneumothorax. IMPRESSION: No acute cardiopulmonary disease. Electronically Signed   By: Richarda Overlie M.D.   On: 05/03/2023 08:13   VAS [place] UPPER EXTREMITY VENOUS DUPLEX  Result Date: 05/02/2023 UPPER VENOUS STUDY  Patient Name:  MYKALA FRIEDEN  Date of Exam:   05/02/2023 Medical Rec #: [id number]         Accession #:    [id number] Date of Birth: 04-Aug-1955         Patient Gender: F Patient Age:   35 years Exam Location:  [facility] Procedure:      VAS [place] UPPER EXTREMITY VENOUS DUPLEX Referring Phys: Bess Harvest [facility] --------------------------------------------------------------------------------  Indications: Swelling, and bruising Anticoagulation: Patient could not externally extend or rotate arm to be properly evaluated. Limitations: Body habitus, poor ultrasound/tissue interface and rigid. Comparison Study: No prior exam. Performing Technologist: Fernande Bras  Examination Guidelines: A complete evaluation includes B-mode imaging, spectral Doppler, color Doppler, and power Doppler as needed of all accessible portions of each vessel. Bilateral testing is considered an integral part of a complete examination. Limited examinations for reoccurring indications may be performed as noted.  Right Findings: +----------+------------+---------+-----------+----------+-------+ RIGHT  CompressiblePhasicitySpontaneousPropertiesSummary +----------+------------+---------+-----------+----------+-------+ IJV           Full       Yes       Yes                      +----------+------------+---------+-----------+----------+-------+ Subclavian    Full       Yes       Yes                      +----------+------------+---------+-----------+----------+-------+ Axillary      Full       Yes       Yes                       +----------+------------+---------+-----------+----------+-------+ Brachial      Full                                          +----------+------------+---------+-----------+----------+-------+ Cephalic      Full                                          +----------+------------+---------+-----------+----------+-------+ Basilic       Full                                          +----------+------------+---------+-----------+----------+-------+  Left Findings: +----------+------------+---------+-----------+----------+-------+ LEFT      CompressiblePhasicitySpontaneousPropertiesSummary +----------+------------+---------+-----------+----------+-------+ Subclavian               Yes       Yes                      +----------+------------+---------+-----------+----------+-------+  Summary:  Right: No evidence of deep vein thrombosis in the upper extremity. No evidence of superficial vein thrombosis in the upper extremity.  Left: No evidence of thrombosis in the subclavian.  *See table(s) above for measurements and observations.  Diagnosing physician: Gerarda Fraction Electronically signed by Gerarda Fraction on 05/02/2023 at 6:13:29 PM.    Final       Signature  -   Huey Bienenstock M.D on 05/06/2023 at 1:29 PM   -  To page go to [email or web address]

## 2023-05-07 DIAGNOSIS — E039 Hypothyroidism, unspecified: Secondary | ICD-10-CM | POA: Diagnosis not present

## 2023-05-07 LAB — CBC WITH DIFFERENTIAL/PLATELET
Abs Immature Granulocytes: 0.07 10*3/uL (ref 0.00–0.07)
Basophils Absolute: 0 10*3/uL (ref 0.0–0.1)
Basophils Relative: 0 %
Eosinophils Absolute: 0.1 10*3/uL (ref 0.0–0.5)
Eosinophils Relative: 1 %
HCT: 25.6 % — ABNORMAL LOW (ref 36.0–46.0)
Hemoglobin: 8.4 g/dL — ABNORMAL LOW (ref 12.0–15.0)
Immature Granulocytes: 1 %
Lymphocytes Relative: 18 %
Lymphs Abs: 0.9 10*3/uL (ref 0.7–4.0)
MCH: 28.3 pg (ref 26.0–34.0)
MCHC: 32.8 g/dL (ref 30.0–36.0)
MCV: 86.2 fL (ref 80.0–100.0)
Monocytes Absolute: 0.6 10*3/uL (ref 0.1–1.0)
Monocytes Relative: 12 %
Neutro Abs: 3.4 10*3/uL (ref 1.7–7.7)
Neutrophils Relative %: 68 %
Platelets: 217 10*3/uL (ref 150–400)
RBC: 2.97 MIL/uL — ABNORMAL LOW (ref 3.87–5.11)
RDW: 15.7 % — ABNORMAL HIGH (ref 11.5–15.5)
WBC: 5.1 10*3/uL (ref 4.0–10.5)
nRBC: 0 % (ref 0.0–0.2)

## 2023-05-07 LAB — BASIC METABOLIC PANEL
Anion gap: 8 (ref 5–15)
BUN: 48 mg/dL — ABNORMAL HIGH (ref 8–23)
CO2: 29 mmol/L (ref 22–32)
Calcium: 8.8 mg/dL — ABNORMAL LOW (ref 8.9–10.3)
Chloride: 106 mmol/L (ref 98–111)
Creatinine, Ser: 1.1 mg/dL — ABNORMAL HIGH (ref 0.44–1.00)
GFR, Estimated: 56 mL/min — ABNORMAL LOW (ref >60)
Glucose, Bld: 174 mg/dL — ABNORMAL HIGH (ref 70–99)
Potassium: 3.6 mmol/L (ref 3.5–5.1)
Sodium: 143 mmol/L (ref 135–145)

## 2023-05-07 LAB — GLUCOSE, CAPILLARY
Glucose-Capillary: 163 mg/dL — ABNORMAL HIGH (ref 70–99)
Glucose-Capillary: 170 mg/dL — ABNORMAL HIGH (ref 70–99)
Glucose-Capillary: 181 mg/dL — ABNORMAL HIGH (ref 70–99)
Glucose-Capillary: 281 mg/dL — ABNORMAL HIGH (ref 70–99)
Glucose-Capillary: 351 mg/dL — ABNORMAL HIGH (ref 70–99)
Glucose-Capillary: 389 mg/dL — ABNORMAL HIGH (ref 70–99)

## 2023-05-07 LAB — T4, FREE: Free T4: 2.89 ng/dL — ABNORMAL HIGH (ref 0.61–1.12)

## 2023-05-07 LAB — TSH: TSH: 0.11 u[IU]/mL — ABNORMAL LOW (ref 0.350–4.500)

## 2023-05-07 MED ORDER — LEVOTHYROXINE SODIUM 75 MCG PO TABS
150.0000 ug | ORAL_TABLET | Freq: Every day | ORAL | Status: DC
  Administered 2023-05-08: 150 ug
  Filled 2023-05-07: qty 6

## 2023-05-07 MED ORDER — PROSIGHT PO TABS
1.0000 | ORAL_TABLET | Freq: Every day | ORAL | Status: DC
  Administered 2023-05-07 – 2023-05-20 (×13): 1 via ORAL
  Filled 2023-05-07 (×5): qty 1

## 2023-05-07 MED ORDER — ENOXAPARIN SODIUM 40 MG/0.4ML IJ SOSY
40.0000 mg | PREFILLED_SYRINGE | INTRAMUSCULAR | Status: DC
  Administered 2023-05-07 – 2023-05-17 (×11): 40 mg via SUBCUTANEOUS
  Filled 2023-05-07 (×11): qty 0.4

## 2023-05-07 MED ORDER — ADULT MULTIVITAMIN W/MINERALS CH
1.0000 | ORAL_TABLET | Freq: Two times a day (BID) | ORAL | Status: DC
  Administered 2023-05-07 – 2023-05-08 (×3): 1
  Filled 2023-05-07 (×4): qty 1

## 2023-05-07 MED ORDER — LEVOTHYROXINE SODIUM 75 MCG PO TABS: 225.0000 ug | ORAL_TABLET | Freq: Every day | ORAL | Status: DC

## 2023-05-07 NOTE — Progress Notes (Addendum)
Pt had minimal UOP in Foley today, 05/07/2023, from 7am to 3pm. The techs cleaned pt at approximately 1500 and told this Clinical research associate, primary RN Hennesy Sobalvarro, the entire bed was wet with urine. I assessed pt's Foley at 1600, and balloon was still inflated, and this writer manually irrigated Foley with 30 cc of sterile water twice. There was initial resistance, and then the Foley was able to be flushed with urine return. The night RN stated that there was a lot of sediment in the urine, and the urine was also foul smelling. This Clinical research associate can confirm this after the Foley was flushed. This writer performed a bladder scan at 1645, and it was 34 cc. There was 900 cc of urine output at 1630, in drainage bag since Foley was flushed at 1600, with sediment throughout drainage tube. Attending physician, Dr. Huey Bienenstock was notified. This Clinical research associate will continue to monitor pt, and pt is in NAD at this time.

## 2023-05-07 NOTE — Progress Notes (Signed)
PROGRESS NOTE                                                                                                                                                                                                             Patient Demographics:    Katherine Navarro, is a 67 y.o. female, DOB - 01-Jun-1956, ZOX:[id number]  Outpatient Primary MD for the patient is Jarold Motto, [place]    LOS - 15  Admit date - 04/22/2023    Chief Complaint  Patient presents with   Weakness       Brief Narrative (HPI from H&P)      DM-1 supposed to be on insulin pump, hypothyroidism and HTN brought to ED with progressive sleepiness over weeks, and admitted for metabolic encephalopathy in the setting of severe hypothyroidism with elevated TSH to 44 and undetectable free T4.  She apparently was noncompliant with her Synthroid medication, head CT, EEG, ABG, ammonia level stable.  She appears to be in myxedema coma, failure to thrive, improving with IV Synthroid, but mentation not back to baseline, she remained with significant poor oral intake and failure to thrive.   Subjective:   Patient denies any complaints today, no significant events as discussed with staff   Assessment  & Plan :   Myxedema coma.  Due to noncompliance with Synthroid, TSH more than 40, free T4 initially undetectable, stable head CT, EEG, ammonia and ABG, no focal deficits, placed on high-dose IV Synthroid, random cortisol over 100, TSH and free T4 improving, Synthroid dose dropped on 04/25/2024, mentation gradually improving, suspect there is some underlying psych issue as well, but denies, will continue to monitor TSH, free T4 and clinically.  Megace added for better appetite.  Overall gradual but definite improvement. -On IV Synthroid, TSH much improved, changed to p.o., will recheck level in a.m.  AKI on CKD 3A.  - Not eating well at all, she has been adequately treated, encouraged  to improve oral intake, Megace asdded, foley.  Renal function improving.  Urinary retention.  Foley Flomax, failed Foley removal, also has some constipation, bowel regimen initiated.  Monitor.  She will need voiding trial when she is more ambulatory.  Hypophosphatemia, hypokalemia.  -Replating, monitor closely as high risk for refeeding syndrome  Poor Oral intake due to #1 above.   Failure to thrive  Moderate protein  calorie malnutrition -Started on tube feeding via core Trak, nutritionist input appreciated  Chronic anemia with evidence of acute worsening due to heme dilution.  No signs of GI bleeding, does have right upper extremity hematoma, replace folic acid and thiamine as they were deficient, placed on PPI transfuse 1 unit on 05/02/2023.  Outpatient anemia workup.  Right upper arm hematoma and swelling.  Stable venous ultrasound, does have a hematoma in the right arm, patient does not recall hurting it, pain and swelling have improved, remove any IV access from the right arm, keep right arm elevated, since she has a moderate to large hematoma stop Lovenox and switch to SCDs.   -Hemoglobin remained stable, as well right upper extremity hematoma and ecchymosis stable, will resume Lovenox  Likely undiagnosed OSA.  Nighttime oxygen with outpatient sleep study.    DM type I.  CBG significantly uncontrolled after initiation of tube feed, much improved after changing her tube feed to Glucerna, and changing her sliding scale to every 4 hours and ending her Semglee to 10 units twice daily.   CBG (last 3)  Recent Labs    05/07/23 0025 05/07/23 0410 05/07/23 0812  GLUCAP 170* 163* 181*   Lab Results  Component Value Date   HGBA1C 9.9 (H) 04/24/2023         Condition - Extremely Guarded  Family Communication  : None at bedside today  Code Status :  Full  Consults  :  None  PUD Prophylaxis :    Procedures  :     Right upper extremity venous duplex.  No blood clots.     Renal ultrasound.  Nonacute.    CT head and EEG - non acute      Disposition Plan  :    Status is: Inpatient   DVT Prophylaxis  : Lovenox stopped on 05/03/2023 due to right upper extremity hematoma.  Switch to SCDs.  Place and maintain sequential compression device Start: 05/03/23 1035  Lab Results  Component Value Date   PLT 217 05/07/2023    Diet :  Diet Order             DIET DYS 3 Room service appropriate? Yes with Assist; Fluid consistency: Thin  Diet effective now                    Inpatient Medications  Scheduled Meds:  (feeding supplement) PROSource Plus  30 mL Oral BID BM   Chlorhexidine Gluconate Cloth  6 each Topical Q0600   docusate sodium  100 mg Oral BID   feeding supplement (PROSource TF20)  60 mL Per Tube Daily   folic acid  1 mg Oral Daily   free water  150 mL Per Tube Q4H   insulin aspart  0-15 Units Subcutaneous Q4H   insulin glargine-yfgn  10 Units Subcutaneous BID   lactose free nutrition  237 mL Oral TID WC   levothyroxine  200 mcg Per Tube Q0600   megestrol  40 mg Oral Daily   multivitamin with minerals  1 tablet Oral Daily   mouth rinse  15 mL Mouth Rinse 4 times per day   pantoprazole  40 mg Oral Daily   polyethylene glycol  17 g Oral BID   tamsulosin  0.4 mg Oral Daily   thiamine (VITAMIN B1) injection  100 mg Intravenous Daily   Continuous Infusions:  feeding supplement (GLUCERNA 1.2 CAL) 1,000 mL (05/06/23 2135)     PRN Meds:.acetaminophen **OR** acetaminophen, dextrose, [DISCONTINUED]  ondansetron **OR** ondansetron (ZOFRAN) IV, mouth rinse, sorbitol  Antibiotics  :    Anti-infectives (From admission, onward)    None         Objective:   Vitals:   05/06/23 1625 05/06/23 1927 05/07/23 0405 05/07/23 0800  BP: (!) 122/56 121/68 132/64 (!) 135/122  Pulse: 97  (!) 101 84  Resp: 17  (!) 25 19  Temp: 98.8 F (37.1 C) 99.3 F (37.4 C) 99.1 F (37.3 C) 98.1 F (36.7 C)  TempSrc: Oral Oral Oral Oral  SpO2: 97%   90% 94%  Weight:      Height:        Wt Readings from Last 3 Encounters:  05/06/23 67.4 kg  03/19/21 72.9 kg  12/18/20 71.7 kg     Intake/Output Summary (Last 24 hours) at 05/07/2023 1144 Last data filed at 05/07/2023 1045 Gross per 24 hour  Intake 237 ml  Output 950 ml  Net -713 ml      Physical Exam  Awake Alert, Oriented X 2, significantly frail, deconditioned.,  Appears much older than stated age Symmetrical Chest wall movement, Good air movement bilaterally, CTAB RRR,No Gallops,Rubs or new Murmurs, No Parasternal Heave +ve B.Sounds, Abd Soft, No tenderness, No rebound - guarding or rigidity. No Cyanosis, Clubbing or edema, No new Rash or bruise         Data Review:    Recent Labs  Lab 05/03/23 0423 05/04/23 0234 05/05/23 1120 05/06/23 0551 05/07/23 0333  WBC 7.8 6.5 4.6 4.8 5.1  HGB 9.6* 9.5* 10.3* 8.5* 8.4*  HCT 28.5* 27.9* 30.4* 25.7* 25.6*  PLT 271 265 271 223 217  MCV 84.3 84.0 87.1 86.0 86.2  MCH 28.4 28.6 29.5 28.4 28.3  MCHC 33.7 34.1 33.9 33.1 32.8  RDW 16.0* 15.8* 15.8* 15.8* 15.7*  LYMPHSABS 0.8 0.6* 0.6* 0.8 0.9  MONOABS 0.5 0.5 0.3 0.4 0.6  EOSABS 0.1 0.1 0.0 0.0 0.1  BASOSABS 0.0 0.0 0.0 0.0 0.0    Recent Labs  Lab 05/03/23 0423 05/04/23 0234 05/04/23 1708 05/05/23 1120 05/05/23 1822 05/06/23 0551 05/07/23 0333  NA 143 141  --  140  --  144 143  K 3.6 3.7  --  3.4*  --  3.1* 3.6  CL 108 105  --  105  --  104 106  CO2 24 27  --  25  --  29 29  ANIONGAP 11 9  --  10  --  11 8  GLUCOSE 229* 263*  --  443*  --  150* 174*  BUN 25* 20  --  21  --  35* 48*  CREATININE 1.18* 1.04*  --  0.92  --  0.99 1.10*  INR 1.0  --   --   --   --   --   --   BNP 873.9* 971.2*  --  1,106.7*  --  665.9*  --   MG 2.2 1.9 2.0 2.0 1.9 1.9  --   CALCIUM 8.7* 8.6*  --  8.5*  --  8.2* 8.8*      Recent Labs  Lab 05/03/23 0423 05/04/23 0234 05/04/23 1708 05/05/23 1120 05/05/23 1822 05/06/23 0551 05/07/23 0333  INR 1.0  --   --   --   --   --    --   BNP 873.9* 971.2*  --  1,106.7*  --  665.9*  --   MG 2.2 1.9 2.0 2.0 1.9 1.9  --   CALCIUM 8.7* 8.6*  --  8.5*  --  8.2* 8.8*    --------------------------------------------------------------------------------------------------------------- Lab Results  Component Value Date   CHOL 240 (H) 09/02/2017   HDL 69.90 09/02/2017   LDLCALC 149 (H) 09/02/2017   LDLDIRECT 210.4 05/14/2013   TRIG 106.0 09/02/2017   CHOLHDL 3 09/02/2017    Lab Results  Component Value Date   HGBA1C 9.9 (H) 04/24/2023   No results for input(s): "TSH", "T4TOTAL", "FREET4", "T3FREE", "THYROIDAB" in the last 72 hours.   No results for input(s): "VITAMINB12", "FOLATE", "FERRITIN", "TIBC", "IRON", "RETICCTPCT" in the last 72 hours.   ------------------------------------------------------------------------------------------------------------------ Cardiac Enzymes No results for input(s): "CKMB", "TROPONINI", "MYOGLOBIN" in the last 168 hours.  Invalid input(s): "CK"  Micro Results No results found for this or any previous visit (from the past 240 hour(s)).   Radiology Reports DG Abd Portable 1V  Result Date: 05/04/2023 CLINICAL DATA:  Feeding tube placement. EXAM: PORTABLE ABDOMEN - 1 VIEW COMPARISON:  Chest radiograph dated 05/03/2023. FINDINGS: Feeding tube with tip in the right upper abdomen likely in the distal stomach. IMPRESSION: Feeding tube with tip in the distal stomach. Electronically Signed   By: Elgie Collard M.D.   On: 05/04/2023 17:25      Signature  -   Huey Bienenstock M.D on 05/07/2023 at 11:44 AM   -  To page go to [email or web address]

## 2023-05-07 NOTE — Plan of Care (Signed)

## 2023-05-07 NOTE — Plan of Care (Signed)
Pt remains on RA. Tolerating tube feeds. Foley catheter maintained. Turned q2h. Family at bedside and updated via bedside nurse.  Problem: Education: Goal: Ability to describe self-care measures that may prevent or decrease complications (Diabetes Survival Skills Education) will improve Outcome: Progressing Goal: Individualized Educational Video(s) Outcome: Progressing   Problem: Coping: Goal: Ability to adjust to condition or change in health will improve Outcome: Progressing   Problem: Fluid Volume: Goal: Ability to maintain a balanced intake and output will improve Outcome: Progressing   Problem: Health Behavior/Discharge Planning: Goal: Ability to identify and utilize available resources and services will improve Outcome: Progressing Goal: Ability to manage health-related needs will improve Outcome: Progressing   Problem: Metabolic: Goal: Ability to maintain appropriate glucose levels will improve Outcome: Progressing   Problem: Nutritional: Goal: Maintenance of adequate nutrition will improve Outcome: Progressing Goal: Progress toward achieving an optimal weight will improve Outcome: Progressing   Problem: Skin Integrity: Goal: Risk for impaired skin integrity will decrease Outcome: Progressing   Problem: Tissue Perfusion: Goal: Adequacy of tissue perfusion will improve Outcome: Progressing   Problem: Education: Goal: Ability to describe self-care measures that may prevent or decrease complications (Diabetes Survival Skills Education) will improve Outcome: Progressing Goal: Individualized Educational Video(s) Outcome: Progressing   Problem: Cardiac: Goal: Ability to maintain an adequate cardiac output will improve Outcome: Progressing   Problem: Health Behavior/Discharge Planning: Goal: Ability to identify and utilize available resources and services will improve Outcome: Progressing Goal: Ability to manage health-related needs will improve Outcome:  Progressing   Problem: Fluid Volume: Goal: Ability to achieve a balanced intake and output will improve Outcome: Progressing   Problem: Metabolic: Goal: Ability to maintain appropriate glucose levels will improve Outcome: Progressing   Problem: Nutritional: Goal: Maintenance of adequate nutrition will improve Outcome: Progressing Goal: Maintenance of adequate weight for body size and type will improve Outcome: Progressing   Problem: Respiratory: Goal: Will regain and/or maintain adequate ventilation Outcome: Progressing   Problem: Urinary Elimination: Goal: Ability to achieve and maintain adequate renal perfusion and functioning will improve Outcome: Progressing   Problem: Education: Goal: Knowledge of General Education information will improve Description: Including pain rating scale, medication(s)/side effects and non-pharmacologic comfort measures Outcome: Progressing   Problem: Health Behavior/Discharge Planning: Goal: Ability to manage health-related needs will improve Outcome: Progressing   Problem: Clinical Measurements: Goal: Ability to maintain clinical measurements within normal limits will improve Outcome: Progressing Goal: Will remain free from infection Outcome: Progressing Goal: Diagnostic test results will improve Outcome: Progressing Goal: Respiratory complications will improve Outcome: Progressing Goal: Cardiovascular complication will be avoided Outcome: Progressing   Problem: Activity: Goal: Risk for activity intolerance will decrease Outcome: Progressing   Problem: Nutrition: Goal: Adequate nutrition will be maintained Outcome: Progressing   Problem: Coping: Goal: Level of anxiety will decrease Outcome: Progressing   Problem: Elimination: Goal: Will not experience complications related to bowel motility Outcome: Progressing Goal: Will not experience complications related to urinary retention Outcome: Progressing   Problem: Pain  Management: Goal: General experience of comfort will improve Outcome: Progressing   Problem: Safety: Goal: Ability to remain free from injury will improve Outcome: Progressing   Problem: Skin Integrity: Goal: Risk for impaired skin integrity will decrease Outcome: Progressing

## 2023-05-08 DIAGNOSIS — E039 Hypothyroidism, unspecified: Secondary | ICD-10-CM | POA: Diagnosis not present

## 2023-05-08 DIAGNOSIS — R4182 Altered mental status, unspecified: Secondary | ICD-10-CM | POA: Diagnosis not present

## 2023-05-08 LAB — BASIC METABOLIC PANEL
Anion gap: 9 (ref 5–15)
BUN: 60 mg/dL — ABNORMAL HIGH (ref 8–23)
CO2: 29 mmol/L (ref 22–32)
Calcium: 9.3 mg/dL (ref 8.9–10.3)
Chloride: 104 mmol/L (ref 98–111)
Creatinine, Ser: 1.28 mg/dL — ABNORMAL HIGH (ref 0.44–1.00)
GFR, Estimated: 46 mL/min — ABNORMAL LOW (ref >60)
Glucose, Bld: 239 mg/dL — ABNORMAL HIGH (ref 70–99)
Potassium: 3.9 mmol/L (ref 3.5–5.1)
Sodium: 142 mmol/L (ref 135–145)

## 2023-05-08 LAB — CBC
HCT: 27.6 % — ABNORMAL LOW (ref 36.0–46.0)
Hemoglobin: 8.9 g/dL — ABNORMAL LOW (ref 12.0–15.0)
MCH: 28.3 pg (ref 26.0–34.0)
MCHC: 32.2 g/dL (ref 30.0–36.0)
MCV: 87.6 fL (ref 80.0–100.0)
Platelets: 232 10*3/uL (ref 150–400)
RBC: 3.15 MIL/uL — ABNORMAL LOW (ref 3.87–5.11)
RDW: 15.8 % — ABNORMAL HIGH (ref 11.5–15.5)
WBC: 7.8 10*3/uL (ref 4.0–10.5)
nRBC: 0.6 % — ABNORMAL HIGH (ref 0.0–0.2)

## 2023-05-08 LAB — GLUCOSE, CAPILLARY
Glucose-Capillary: 148 mg/dL — ABNORMAL HIGH (ref 70–99)
Glucose-Capillary: 214 mg/dL — ABNORMAL HIGH (ref 70–99)
Glucose-Capillary: 225 mg/dL — ABNORMAL HIGH (ref 70–99)
Glucose-Capillary: 225 mg/dL — ABNORMAL HIGH (ref 70–99)
Glucose-Capillary: 235 mg/dL — ABNORMAL HIGH (ref 70–99)
Glucose-Capillary: 254 mg/dL — ABNORMAL HIGH (ref 70–99)
Glucose-Capillary: 283 mg/dL — ABNORMAL HIGH (ref 70–99)

## 2023-05-08 MED ORDER — HYDROCORTISONE SOD SUC (PF) 100 MG IJ SOLR
100.0000 mg | Freq: Once | INTRAMUSCULAR | Status: AC
  Administered 2023-05-08: 100 mg via INTRAVENOUS
  Filled 2023-05-08: qty 2

## 2023-05-08 MED ORDER — FOLIC ACID 1 MG PO TABS
1.0000 mg | ORAL_TABLET | Freq: Every day | ORAL | Status: DC
  Administered 2023-05-09 – 2023-05-11 (×3): 1 mg
  Filled 2023-05-08 (×3): qty 1

## 2023-05-08 MED ORDER — ACETAMINOPHEN 650 MG RE SUPP
650.0000 mg | Freq: Four times a day (QID) | RECTAL | Status: DC | PRN
Start: 2023-05-08 — End: 2023-05-15

## 2023-05-08 MED ORDER — POLYETHYLENE GLYCOL 3350 17 G PO PACK
17.0000 g | PACK | Freq: Two times a day (BID) | ORAL | Status: DC
  Administered 2023-05-08: 17 g
  Filled 2023-05-08 (×3): qty 1

## 2023-05-08 MED ORDER — THIAMINE MONONITRATE 100 MG PO TABS
100.0000 mg | ORAL_TABLET | Freq: Every day | ORAL | Status: DC
  Administered 2023-05-09 – 2023-05-15 (×7): 100 mg
  Filled 2023-05-08 (×7): qty 1

## 2023-05-08 MED ORDER — ACETAMINOPHEN 325 MG PO TABS
650.0000 mg | ORAL_TABLET | Freq: Four times a day (QID) | ORAL | Status: DC | PRN
Start: 2023-05-08 — End: 2023-05-15
  Filled 2023-05-08: qty 2

## 2023-05-08 MED ORDER — INSULIN GLARGINE-YFGN 100 UNIT/ML ~~LOC~~ SOLN
12.0000 [IU] | Freq: Two times a day (BID) | SUBCUTANEOUS | Status: DC
  Administered 2023-05-08 (×2): 12 [IU] via SUBCUTANEOUS
  Filled 2023-05-08 (×3): qty 0.12

## 2023-05-08 MED ORDER — MEGESTROL ACETATE 40 MG PO TABS
40.0000 mg | ORAL_TABLET | Freq: Every day | ORAL | Status: DC
  Filled 2023-05-08: qty 1

## 2023-05-08 NOTE — Progress Notes (Signed)
PROGRESS NOTE                                                                                                                                                                                                             Patient Demographics:    Katherine Navarro, is a 67 y.o. female, DOB - [birth year], [id number]  Outpatient Primary MD for the patient is Jarold Motto, [place]    LOS - 16  Admit date - 04/22/2023    Chief Complaint  Patient presents with   Weakness       Brief Narrative (HPI from H&P)      DM-1 supposed to be on insulin pump, hypothyroidism and HTN brought to ED with progressive sleepiness over weeks, and admitted for metabolic encephalopathy in the setting of severe hypothyroidism with elevated TSH to 44 and undetectable free T4.  She apparently was noncompliant with her Synthroid medication, head CT, EEG, ABG, ammonia level stable.  She appears to be in myxedema coma, failure to thrive, improving with IV Synthroid, but mentation not back to baseline, she remained with significant poor oral intake and failure to thrive.   Subjective:   Patient denies any complaints today, no significant events as discussed with staff   Assessment  & Plan :   Metabolic encephalopathy Myxedema coma.  -stable head CT, EEG, ammonia and ABG, no focal deficits, - Due to noncompliance with Synthroid, TSH more than 40, free T4 initially undetectable, s placed on high-dose IV Synthroid. -Mentation much improved. -Repeat TSH and free T4 done overnight with significantly low TSH level and elevated free T4 level, so at this point we will hold on thyroid supplementation for 3 days then we will start at a much lower dose.  Failure to thrive -Apathy much improved, she is awake, but overall remains with poor mentation, and very poor oral intake, so she was started on core Trak tube feed for now . -Will keep encouraging oral intake, will  transition to nocturnal tube feed by tomorrow -Patient is following closely  AKI on CKD 3A.  - Not eating well at all, she has been adequately treated, encouraged to improve oral intake, Megace asdded, foley.  Renal function improving.  Urinary retention.  Foley Flomax, failed Foley removal, also has some constipation, bowel regimen initiated.  Monitor.  She will need voiding trial when  she is more ambulatory. -Patient with evidence of urinary retention yesterday evening with a Foley catheter, she is having significant sediments, so she is started on Foley flush 100 cc every 4 hours, will monitor intermittently with bladder scan.  Hypophosphatemia, hypokalemia.  -Replating, monitor closely as high risk for refeeding syndrome  Poor Oral intake due to #1 above.   Failure to thrive  Moderate protein calorie malnutrition -Started on tube feeding via core Trak, nutritionist input appreciated  Chronic anemia with evidence of acute worsening due to heme dilution.  No signs of GI bleeding, does have right upper extremity hematoma, replace folic acid and thiamine as they were deficient, placed on PPI transfuse 1 unit on 05/02/2023.  Outpatient anemia workup.  Right upper arm hematoma and swelling.  Stable venous ultrasound, does have a hematoma in the right arm, patient does not recall hurting it, pain and swelling have improved, remove any IV access from the right arm, keep right arm elevated, since she has a moderate to large hematoma stop Lovenox and switch to SCDs.   -Hemoglobin remained stable, as well right upper extremity hematoma and ecchymosis stable, resumed Lovenox  Likely undiagnosed OSA.  Nighttime oxygen with outpatient sleep study.    DM type I.  CBG significantly uncontrolled after initiation of tube feed, much improved after changing her tube feed to Glucerna, and changing her sliding scale to every 4 hours, will increase Semglee to 12 units twice daily given elevated CBGs.   CBG  (last 3)  Recent Labs    05/08/23 0428 05/08/23 0844 05/08/23 1211  GLUCAP 225* 148* 225*   Lab Results  Component Value Date   HGBA1C 9.9 (H) 04/24/2023         Condition - Extremely Guarded  Family Communication  : None at bedside today  Code Status :  Full  Consults  :  None  PUD Prophylaxis :    Procedures  :     Right upper extremity venous duplex.  No blood clots.    Renal ultrasound.  Nonacute.    CT head and EEG - non acute      Disposition Plan  :    Status is: Inpatient   DVT Prophylaxis  : Lovenox stopped on 05/03/2023 due to right upper extremity hematoma.  Switch to SCDs.  enoxaparin (LOVENOX) injection 40 mg Start: 05/07/23 1245 Place and maintain sequential compression device Start: 05/03/23 1035  Lab Results  Component Value Date   PLT 232 05/08/2023    Diet :  Diet Order             DIET DYS 3 Room service appropriate? Yes with Assist; Fluid consistency: Thin  Diet effective now                    Inpatient Medications  Scheduled Meds:  (feeding supplement) PROSource Plus  30 mL Oral BID BM   Chlorhexidine Gluconate Cloth  6 each Topical Q0600   docusate sodium  100 mg Oral BID   enoxaparin (LOVENOX) injection  40 mg Subcutaneous Q24H   feeding supplement (PROSource TF20)  60 mL Per Tube Daily   [START ON 05/09/2023] folic acid  1 mg Per Tube Daily   free water  150 mL Per Tube Q4H   hydrocortisone sod succinate (SOLU-CORTEF) inj  100 mg Intravenous Once   insulin aspart  0-15 Units Subcutaneous Q4H   insulin glargine-yfgn  12 Units Subcutaneous BID   lactose free nutrition  237 mL  Oral TID WC   [START ON 05/09/2023] megestrol  40 mg Per Tube Daily   multivitamin  1 tablet Oral Daily   multivitamin with minerals  1 tablet Per Tube BID   mouth rinse  15 mL Mouth Rinse 4 times per day   pantoprazole  40 mg Oral Daily   polyethylene glycol  17 g Per Tube BID   tamsulosin  0.4 mg Oral Daily   thiamine  100 mg Per Tube  Daily   Continuous Infusions:  feeding supplement (GLUCERNA 1.2 CAL) 1,000 mL (05/07/23 1656)     PRN Meds:.acetaminophen **OR** acetaminophen, dextrose, [DISCONTINUED] ondansetron **OR** ondansetron (ZOFRAN) IV, mouth rinse, sorbitol  Antibiotics  :    Anti-infectives (From admission, onward)    None         Objective:   Vitals:   05/08/23 0000 05/08/23 0100 05/08/23 0423 05/08/23 0800  BP: (!) 112/57  124/65 116/85  Pulse: 99 (!) 104 (!) 102 (!) 102  Resp: 18 (!) 26 18 20   Temp: 97.6 F (36.4 C)  98 F (36.7 C) 97.6 F (36.4 C)  TempSrc: Oral  Oral Oral  SpO2: 95% 95% 94% 97%  Weight:      Height:        Wt Readings from Last 3 Encounters:  05/06/23 67.4 kg  03/19/21 72.9 kg  12/18/20 71.7 kg     Intake/Output Summary (Last 24 hours) at 05/08/2023 1228 Last data filed at 05/07/2023 2016 Gross per 24 hour  Intake 360 ml  Output 1450 ml  Net -1090 ml      Physical Exam  Awake Alert, Oriented X 2, significantly frail, deconditioned.,  Appears much older than stated age Symmetrical Chest wall movement, Good air movement bilaterally, CTAB RRR,No Gallops,Rubs or new Murmurs, No Parasternal Heave +ve B.Sounds, Abd Soft, No tenderness, No rebound - guarding or rigidity. No Cyanosis, Clubbing or edema, No new Rash or bruise         Data Review:    Recent Labs  Lab 05/03/23 0423 05/04/23 0234 05/05/23 1120 05/06/23 0551 05/07/23 0333 05/08/23 0322  WBC 7.8 6.5 4.6 4.8 5.1 7.8  HGB 9.6* 9.5* 10.3* 8.5* 8.4* 8.9*  HCT 28.5* 27.9* 30.4* 25.7* 25.6* 27.6*  PLT 271 265 271 223 217 232  MCV 84.3 84.0 87.1 86.0 86.2 87.6  MCH 28.4 28.6 29.5 28.4 28.3 28.3  MCHC 33.7 34.1 33.9 33.1 32.8 32.2  RDW 16.0* 15.8* 15.8* 15.8* 15.7* 15.8*  LYMPHSABS 0.8 0.6* 0.6* 0.8 0.9  --   MONOABS 0.5 0.5 0.3 0.4 0.6  --   EOSABS 0.1 0.1 0.0 0.0 0.1  --   BASOSABS 0.0 0.0 0.0 0.0 0.0  --     Recent Labs  Lab 05/03/23 0423 05/04/23 0234 05/04/23 1708  05/05/23 1120 05/05/23 1822 05/06/23 0551 05/07/23 0333 05/07/23 1224 05/08/23 0322  NA 143 141  --  140  --  144 143  --  142  K 3.6 3.7  --  3.4*  --  3.1* 3.6  --  3.9  CL 108 105  --  105  --  104 106  --  104  CO2 24 27  --  25  --  29 29  --  29  ANIONGAP 11 9  --  10  --  11 8  --  9  GLUCOSE 229* 263*  --  443*  --  150* 174*  --  239*  BUN 25* 20  --  21  --  35* 48*  --  60*  CREATININE 1.18* 1.04*  --  0.92  --  0.99 1.10*  --  1.28*  INR 1.0  --   --   --   --   --   --   --   --   TSH  --   --   --   --   --   --   --  0.110*  --   BNP 873.9* 971.2*  --  1,106.7*  --  665.9*  --   --   --   MG 2.2 1.9 2.0 2.0 1.9 1.9  --   --   --   CALCIUM 8.7* 8.6*  --  8.5*  --  8.2* 8.8*  --  9.3      Recent Labs  Lab 05/03/23 0423 05/04/23 0234 05/04/23 1708 05/05/23 1120 05/05/23 1822 05/06/23 0551 05/07/23 0333 05/07/23 1224 05/08/23 0322  INR 1.0  --   --   --   --   --   --   --   --   TSH  --   --   --   --   --   --   --  0.110*  --   BNP 873.9* 971.2*  --  1,106.7*  --  665.9*  --   --   --   MG 2.2 1.9 2.0 2.0 1.9 1.9  --   --   --   CALCIUM 8.7* 8.6*  --  8.5*  --  8.2* 8.8*  --  9.3    --------------------------------------------------------------------------------------------------------------- Lab Results  Component Value Date   CHOL 240 (H) 09/02/2017   HDL 69.90 09/02/2017   LDLCALC 149 (H) 09/02/2017   LDLDIRECT 210.4 05/14/2013   TRIG 106.0 09/02/2017   CHOLHDL 3 09/02/2017    Lab Results  Component Value Date   HGBA1C 9.9 (H) 04/24/2023   Recent Labs    05/07/23 1224  TSH 0.110*  FREET4 2.89*     No results for input(s): "VITAMINB12", "FOLATE", "FERRITIN", "TIBC", "IRON", "RETICCTPCT" in the last 72 hours.   ------------------------------------------------------------------------------------------------------------------ Cardiac Enzymes No results for input(s): "CKMB", "TROPONINI", "MYOGLOBIN" in the last 168 hours.  Invalid  input(s): "CK"  Micro Results No results found for this or any previous visit (from the past 240 hour(s)).   Radiology Reports DG Abd Portable 1V  Result Date: 05/04/2023 CLINICAL DATA:  Feeding tube placement. EXAM: PORTABLE ABDOMEN - 1 VIEW COMPARISON:  Chest radiograph dated 05/03/2023. FINDINGS: Feeding tube with tip in the right upper abdomen likely in the distal stomach. IMPRESSION: Feeding tube with tip in the distal stomach. Electronically Signed   By: Elgie Collard M.D.   On: 05/04/2023 17:25      Signature  -   Huey Bienenstock M.D on 05/08/2023 at 12:28 PM   -  To page go to [email or web address]

## 2023-05-08 NOTE — Plan of Care (Signed)

## 2023-05-08 NOTE — Plan of Care (Signed)
PT remains on RA. Tolerating tube feeds. Foley catheter maintained. Turned q2. Family at bedside and updated via bedside nurse.   Problem: Education: Goal: Ability to describe self-care measures that may prevent or decrease complications (Diabetes Survival Skills Education) will improve Outcome: Progressing Goal: Individualized Educational Video(s) Outcome: Progressing   Problem: Coping: Goal: Ability to adjust to condition or change in health will improve Outcome: Progressing   Problem: Fluid Volume: Goal: Ability to maintain a balanced intake and output will improve Outcome: Progressing   Problem: Health Behavior/Discharge Planning: Goal: Ability to identify and utilize available resources and services will improve Outcome: Progressing Goal: Ability to manage health-related needs will improve Outcome: Progressing   Problem: Metabolic: Goal: Ability to maintain appropriate glucose levels will improve Outcome: Progressing   Problem: Nutritional: Goal: Maintenance of adequate nutrition will improve Outcome: Progressing Goal: Progress toward achieving an optimal weight will improve Outcome: Progressing   Problem: Skin Integrity: Goal: Risk for impaired skin integrity will decrease Outcome: Progressing   Problem: Tissue Perfusion: Goal: Adequacy of tissue perfusion will improve Outcome: Progressing   Problem: Education: Goal: Ability to describe self-care measures that may prevent or decrease complications (Diabetes Survival Skills Education) will improve Outcome: Progressing Goal: Individualized Educational Video(s) Outcome: Progressing   Problem: Cardiac: Goal: Ability to maintain an adequate cardiac output will improve Outcome: Progressing   Problem: Health Behavior/Discharge Planning: Goal: Ability to identify and utilize available resources and services will improve Outcome: Progressing Goal: Ability to manage health-related needs will improve Outcome:  Progressing   Problem: Fluid Volume: Goal: Ability to achieve a balanced intake and output will improve Outcome: Progressing   Problem: Metabolic: Goal: Ability to maintain appropriate glucose levels will improve Outcome: Progressing   Problem: Nutritional: Goal: Maintenance of adequate nutrition will improve Outcome: Progressing Goal: Maintenance of adequate weight for body size and type will improve Outcome: Progressing   Problem: Respiratory: Goal: Will regain and/or maintain adequate ventilation Outcome: Progressing   Problem: Urinary Elimination: Goal: Ability to achieve and maintain adequate renal perfusion and functioning will improve Outcome: Progressing   Problem: Education: Goal: Knowledge of General Education information will improve Description: Including pain rating scale, medication(s)/side effects and non-pharmacologic comfort measures Outcome: Progressing   Problem: Health Behavior/Discharge Planning: Goal: Ability to manage health-related needs will improve Outcome: Progressing   Problem: Clinical Measurements: Goal: Ability to maintain clinical measurements within normal limits will improve Outcome: Progressing Goal: Will remain free from infection Outcome: Progressing Goal: Diagnostic test results will improve Outcome: Progressing Goal: Respiratory complications will improve Outcome: Progressing Goal: Cardiovascular complication will be avoided Outcome: Progressing   Problem: Activity: Goal: Risk for activity intolerance will decrease Outcome: Progressing   Problem: Nutrition: Goal: Adequate nutrition will be maintained Outcome: Progressing   Problem: Coping: Goal: Level of anxiety will decrease Outcome: Progressing   Problem: Elimination: Goal: Will not experience complications related to bowel motility Outcome: Progressing Goal: Will not experience complications related to urinary retention Outcome: Progressing   Problem: Pain  Management: Goal: General experience of comfort will improve Outcome: Progressing   Problem: Safety: Goal: Ability to remain free from injury will improve Outcome: Progressing   Problem: Skin Integrity: Goal: Risk for impaired skin integrity will decrease Outcome: Progressing

## 2023-05-09 ENCOUNTER — Inpatient Hospital Stay (HOSPITAL_COMMUNITY): Payer: Medicare HMO

## 2023-05-09 DIAGNOSIS — E039 Hypothyroidism, unspecified: Secondary | ICD-10-CM | POA: Diagnosis not present

## 2023-05-09 DIAGNOSIS — R4182 Altered mental status, unspecified: Secondary | ICD-10-CM | POA: Diagnosis not present

## 2023-05-09 LAB — SCLERODERMA DIAGNOSTIC PROFILE
Anti Nuclear Antibody (ANA): NEGATIVE
Scleroderma (Scl-70) (ENA) Antibody, IgG: <0.2 AI (ref 0.0–0.9)

## 2023-05-09 LAB — BASIC METABOLIC PANEL
Anion gap: 11 (ref 5–15)
BUN: 67 mg/dL — ABNORMAL HIGH (ref 8–23)
CO2: 28 mmol/L (ref 22–32)
Calcium: 9.4 mg/dL (ref 8.9–10.3)
Chloride: 104 mmol/L (ref 98–111)
Creatinine, Ser: 0.94 mg/dL (ref 0.44–1.00)
GFR, Estimated: >60 mL/min (ref >60)
Glucose, Bld: 208 mg/dL — ABNORMAL HIGH (ref 70–99)
Potassium: 3.8 mmol/L (ref 3.5–5.1)
Sodium: 143 mmol/L (ref 135–145)

## 2023-05-09 LAB — GLUCOSE, CAPILLARY
Glucose-Capillary: 192 mg/dL — ABNORMAL HIGH (ref 70–99)
Glucose-Capillary: 182 mg/dL — ABNORMAL HIGH (ref 70–99)
Glucose-Capillary: 277 mg/dL — ABNORMAL HIGH (ref 70–99)
Glucose-Capillary: 205 mg/dL — ABNORMAL HIGH (ref 70–99)
Glucose-Capillary: 279 mg/dL — ABNORMAL HIGH (ref 70–99)
Glucose-Capillary: 210 mg/dL — ABNORMAL HIGH (ref 70–99)

## 2023-05-09 LAB — CBC
HCT: 28.1 % — ABNORMAL LOW (ref 36.0–46.0)
Hemoglobin: 9.1 g/dL — ABNORMAL LOW (ref 12.0–15.0)
MCH: 28.6 pg (ref 26.0–34.0)
MCHC: 32.4 g/dL (ref 30.0–36.0)
MCV: 88.4 fL (ref 80.0–100.0)
Platelets: 256 10*3/uL (ref 150–400)
RBC: 3.18 MIL/uL — ABNORMAL LOW (ref 3.87–5.11)
RDW: 15.9 % — ABNORMAL HIGH (ref 11.5–15.5)
WBC: 9.8 10*3/uL (ref 4.0–10.5)
nRBC: 0.6 % — ABNORMAL HIGH (ref 0.0–0.2)

## 2023-05-09 LAB — FOLATE: Folate: 8.8 ng/mL (ref >5.9)

## 2023-05-09 MED ORDER — INSULIN GLARGINE-YFGN 100 UNIT/ML ~~LOC~~ SOLN
8.0000 [IU] | Freq: Every day | SUBCUTANEOUS | Status: DC
  Filled 2023-05-09: qty 0.08

## 2023-05-09 MED ORDER — MIRTAZAPINE 15 MG PO TBDP
15.0000 mg | ORAL_TABLET | Freq: Every day | ORAL | Status: DC
  Administered 2023-05-09: 15 mg via ORAL
  Filled 2023-05-09: qty 1

## 2023-05-09 MED ORDER — LACTATED RINGERS IV SOLN: INTRAVENOUS | Status: DC

## 2023-05-09 MED ORDER — INSULIN GLARGINE-YFGN 100 UNIT/ML ~~LOC~~ SOLN
12.0000 [IU] | Freq: Two times a day (BID) | SUBCUTANEOUS | Status: DC
  Filled 2023-05-09: qty 0.12

## 2023-05-09 MED ORDER — INSULIN GLARGINE-YFGN 100 UNIT/ML ~~LOC~~ SOLN
14.0000 [IU] | Freq: Two times a day (BID) | SUBCUTANEOUS | Status: DC
  Filled 2023-05-09: qty 0.14

## 2023-05-09 NOTE — TOC Progression Note (Signed)
Transition of Care [facility]) - Progression Note    Patient Details  Name: Katherine Navarro MRN: [id number] Date of Birth: March 28, 1956  Transition [facility]) CM/SW Contact  Erin Sons, [place] Phone Number: 05/09/2023, 2:40 PM  Clinical Narrative:     CSW called and left voicemail with pt's spouse requesting return call.   Expected Discharge Plan: Skilled Nursing Facility Barriers to Discharge: Continued Medical Work up, English as a second language teacher, SNF Pending bed offer  Expected Discharge Plan and Services In-house Referral: Clinical Social Work   Post Acute Care Choice: Skilled Nursing Facility Living arrangements for the past 2 months: Single Family Home                                       Social Determinants of Health (SDOH) Interventions SDOH Screenings   Food Insecurity: No Food Insecurity (04/25/2023)  Housing: Low Risk  (04/25/2023)  Transportation Needs: No Transportation Needs (04/25/2023)  Utilities: Not At Risk (04/25/2023)  Depression (PHQ2-9): Low Risk  (10/31/2019)  Tobacco Use: Low Risk  (04/22/2023)    Readmission Risk Interventions     No data to display

## 2023-05-09 NOTE — Plan of Care (Signed)

## 2023-05-09 NOTE — Progress Notes (Signed)
Physical Therapy Treatment Patient Details Name: Katherine Navarro MRN: [id number] DOB: 1955-11-02 Today's Date: 05/09/2023   History of Present Illness Pt is a 67 y/o F presenting to ED on 11/1 with progressive sleepiness, CT head negative, Admitted for metabolic encephalopathy in setting of hypothyroidism with elevated TSH. PMH includes DM1, hypothyroidism, HTN, diabetic retinopathy    PT Comments  Pt received in supine and agreeable to session. Pt demonstrates impaired initiation and limited command following during session requiring increased cues and assist. Pt requires max A to sit to EOB with pt attempting to advance BLE to EOB, however only bending knees. Pt demonstrates a strong R lateral lean sitting EOB requiring consistent assist to maintain sitting. Pt requires max A +2 to complete a lateral scoot to recliner. Attempted a squat position from recliner for pad adjustment and pericare, however pt demonstrating little initiation and BLE support requiring total A. Pt unable to lean trunk forward while sitting in the recliner despite cues and pt stating "ok" to cues, however does not initiate. Pt's eyes remain closed for most of session requiring cues to open them. Pt continues to benefit from PT services to progress toward functional mobility goals.     If plan is discharge home, recommend the following: Assistance with cooking/housework;Assistance with feeding;Help with stairs or ramp for entrance;Direct supervision/assist for medications management;Direct supervision/assist for financial management;Assist for transportation;Supervision due to cognitive status;Two people to help with walking and/or transfers   Can travel by private vehicle     No  Equipment Recommendations  None recommended by PT    Recommendations for Other Services       Precautions / Restrictions Precautions Precautions: Fall Precaution Comments: cortrak Restrictions Weight Bearing Restrictions: No      Mobility  Bed Mobility Overal bed mobility: Needs Assistance Bed Mobility: Supine to Sit     Supine to sit: Max assist     General bed mobility comments: Pt able to bend B knees, but requires dense cues and assist for all aspects of bed mobility    Transfers Overall transfer level: Needs assistance   Transfers: Bed to chair/wheelchair/BSC            Lateral/Scoot Transfers: +2 physical assistance, Max assist General transfer comment: Max A +2 with bedpad to scoot to recliner despite dense cues for pt participation and sequencing. Attempted a squat from recliner for pericare and pad adjustment, however pt requiring total A       Balance Overall balance assessment: Needs assistance Sitting-balance support: No upper extremity supported, Feet supported Sitting balance-Leahy Scale: Zero Sitting balance - Comments: sitting EOB with R lean requiring mod-max A to maintain neutral despite dense cues and placement of R hand on bed for support Postural control: Right lateral lean                                  Cognition Arousal: Alert Behavior During Therapy: Flat affect Overall Cognitive Status: No family/caregiver present to determine baseline cognitive functioning                                 General Comments: pt requires dense cues with pt demonstrating limited initiation and impaired processing        Exercises      General Comments        Pertinent Vitals/Pain Pain Assessment Pain Assessment: Faces  Pain Score: 0-No pain     PT Goals (current goals can now be found in the care plan section) Acute Rehab PT Goals Patient Stated Goal: none stated PT Goal Formulation: With patient/family Time For Goal Achievement: 05/07/23 Progress towards PT goals: Not progressing toward goals - comment (limited by impaired cognition)    Frequency    Min 1X/week       AM-PAC PT "6 Clicks" Mobility   Outcome Measure  Help needed  turning from your back to your side while in a flat bed without using bedrails?: A Lot Help needed moving from lying on your back to sitting on the side of a flat bed without using bedrails?: A Lot Help needed moving to and from a bed to a chair (including a wheelchair)?: Total Help needed standing up from a chair using your arms (e.g., wheelchair or bedside chair)?: Total Help needed to walk in hospital room?: Total Help needed climbing 3-5 steps with a railing? : Total 6 Click Score: 8    End of Session Equipment Utilized During Treatment: Gait belt Activity Tolerance: Patient limited by fatigue Patient left: in chair;with chair alarm set;with call bell/phone within reach;with nursing/sitter in room Nurse Communication: Mobility status PT Visit Diagnosis: Other abnormalities of gait and mobility (R26.89);Muscle weakness (generalized) (M62.81);Difficulty in walking, not elsewhere classified (R26.2)     Time: 2956-2130 PT Time Calculation (min) (ACUTE ONLY): 16 min  Charges:    $Therapeutic Activity: 8-22 mins PT General Charges $$ ACUTE PT VISIT: 1 Visit                     Johny Shock, PTA Acute Rehabilitation Services Secure Chat Preferred  [phone]    Johny Shock 05/09/2023, 4:12 PM

## 2023-05-09 NOTE — Progress Notes (Signed)
Ok to change Megace to Remeron 15mg  qday for appetite stimulant per Dr. Randol Kern.  Ulyses Southward, PharmD, BCIDP, AAHIVP, CPP Infectious Disease Pharmacist 05/09/2023 10:29 AM

## 2023-05-09 NOTE — Plan of Care (Signed)

## 2023-05-09 NOTE — Progress Notes (Signed)
Nutrition Follow-up  DOCUMENTATION CODES:   Non-severe (moderate) malnutrition in context of chronic illness  INTERVENTION:  Hold EN support.  Continue with DYS 3 diet, Assistance with each meal Continue multivitamin with minerals Discontinue PSTF  NUTRITION DIAGNOSIS:   Moderate Malnutrition related to chronic illness as evidenced by meal completion < 25%, moderate fat depletion, moderate muscle depletion.    GOAL:   Patient will meet greater than or equal to 90% of their needs    MONITOR:   PO intake, Supplement acceptance, Weight trends  REASON FOR ASSESSMENT:   Consult Assessment of nutrition requirement/status  ASSESSMENT:   67 y.o. F, admitted with severe hypothyroidism. PMHX: DM-1 supposed to be on insulin pump, hypothyroidism and HTN, leukopenia.  Reported to have declined oral intake.  Patient appetite has not improved. Meeting with team; plan to hold feedings, assist patient with meals. To see if intake will improve. If intake decline then possible PEG placement.   Currently Cortrak is in place TF will be held.   NUTRITION - FOCUSED PHYSICAL EXAM:  Flowsheet Row Most Recent Value  Orbital Region Mild depletion  Upper Arm Region Mild depletion  Thoracic and Lumbar Region No depletion  Buccal Region Moderate depletion  Temple Region Moderate depletion  Clavicle and Acromion Bone Region Mild depletion  Scapular Bone Region Unable to assess  Dorsal Hand Unable to assess  Patellar Region Mild depletion  Anterior Thigh Region Mild depletion  Posterior Calf Region Mild depletion  Edema (RD Assessment) Mild  Hair Reviewed  Eyes Reviewed  Mouth Reviewed  Skin Reviewed  Nails Reviewed       Diet Order:   Diet Order             DIET DYS 3 Room service appropriate? Yes with Assist; Fluid consistency: Thin  Diet effective now                   EDUCATION NEEDS:   Not appropriate for education at this time  Skin:  Skin Assessment: Reviewed  RN Assessment  Last BM:  05/06/23  Height:   Ht Readings from Last 1 Encounters:  04/23/23 5\' 3"  (1.6 m)    Weight:   Wt Readings from Last 1 Encounters:  05/06/23 67.4 kg    Ideal Body Weight:     BMI:  Body mass index is 26.32 kg/m.  Estimated Nutritional Needs:   Kcal:  2000-2300kcal  Protein:  95-110 g/day  Fluid:  1800-2200 ml/d    Jamelle Haring RDN, LDN Clinical Dietitian  RDN pager # available on Amion

## 2023-05-09 NOTE — Progress Notes (Signed)
PROGRESS NOTE                                                                                                                                                                                                             Patient Demographics:    Katherine Navarro, is a 67 y.o. female, DOB - Nov 09, 1955, NFA:[id number]  Outpatient Primary MD for the patient is Jarold Motto, [place]    LOS - 17  Admit date - 04/22/2023    Chief Complaint  Patient presents with   Weakness       Brief Narrative (HPI from H&P)      DM-1 supposed to be on insulin pump, hypothyroidism and HTN brought to ED with progressive sleepiness over weeks, and admitted for metabolic encephalopathy in the setting of severe hypothyroidism with elevated TSH to 44 and undetectable free T4.  She apparently was noncompliant with her Synthroid medication, head CT, EEG, ABG, ammonia level stable.  She appears to be in myxedema coma, failure to thrive, improving with IV Synthroid, but mentation not back to baseline, she remained with significant poor oral intake and failure to thrive.  So tube feed was started 11/15   Subjective:   Patient denies any complaints today, no significant events as discussed with staff   Assessment  & Plan :   Metabolic encephalopathy Myxedema coma.  -stable head CT, EEG, ammonia and ABG, no focal deficits, - Due to noncompliance with Synthroid, TSH more than 40, free T4 initially undetectable, though she was treated with high-dose IV Synthroid.  -Mentation has improved, but she remains somnolent, weak, frail and altered with very poor oral intake. -Repeat TSH and free T4 done overnight with significantly low TSH level and elevated free T4 level, though IV Synthroid has been discontinued 11/17, will hold for 3 days then resume p.o. at a lower dose.   -She remains significant altered, will check MRI brain  Failure to thrive -Her encephalopathy  and mentation much improved , but overall remains with poor mentation, and very poor oral intake, so she remains on cortrak tube feed for now .  Remains with very poor oral intake, so we hold her tube feeding for now to see if this improves her appetite, discussed with staff to keep encouraging her to eat her meals.  AKI on CKD 3A.  - Not eating  well at all, she has been adequately treated, encouraged to improve oral intake, Megace asdded, foley.  Renal function improving.  Urinary retention.  Foley Flomax. -Foley removal, was reinserted . -Still have urinary retention even with Foley inserted, due to significant sediment, so started on Foley flushes, sediment has cleared .  Hypophosphatemia, hypokalemia.  -Repleted, monitor closely  Poor Oral intake due to #1 above.   Failure to thrive  Moderate protein calorie malnutrition -Started on tube feeding via core Trak, will transition  Chronic anemia with evidence of acute worsening due to heme dilution.  No signs of GI bleeding, does have right upper extremity hematoma, replace folic acid and thiamine as they were deficient, placed on PPI transfuse 1 unit on 05/02/2023.  Outpatient anemia workup.  Right upper arm hematoma and swelling.  Stable venous ultrasound, does have a hematoma in the right arm, patient does not recall hurting it, pain and swelling have improved, remove any IV access from the right arm, keep right arm elevated, since she has a moderate to large hematoma stop Lovenox and switch to SCDs.   -Hemoglobin remained stable, as well right upper extremity hematoma and ecchymosis stable, resumed Lovenox  Likely undiagnosed OSA.  Nighttime oxygen with outpatient sleep study.    DM type I.  BG uncontrolled, but I will hold on increasing her Semglee now I will be holding her tube feeds .   CBG (last 3)  Recent Labs    05/08/23 2327 05/09/23 0600 05/09/23 0759  GLUCAP 214* 192* 182*   Lab Results  Component Value Date   HGBA1C  9.9 (H) 04/24/2023         Condition - Extremely Guarded  Family Communication  : None at bedside today, discussed with husband by phone today  Code Status :  Full  Consults  :  None  PUD Prophylaxis :    Procedures  :     Right upper extremity venous duplex.  No blood clots.    Renal ultrasound.  Nonacute.    CT head and EEG - non acute      Disposition Plan  :    Status is: Inpatient   DVT Prophylaxis  : Lovenox stopped on 05/03/2023 due to right upper extremity hematoma.  Switch to SCDs.  enoxaparin (LOVENOX) injection 40 mg Start: 05/07/23 1245 Place and maintain sequential compression device Start: 05/03/23 1035  Lab Results  Component Value Date   PLT 256 05/09/2023    Diet :  Diet Order             DIET DYS 3 Room service appropriate? Yes with Assist; Fluid consistency: Thin  Diet effective now                    Inpatient Medications  Scheduled Meds:  (feeding supplement) PROSource Plus  30 mL Oral BID BM   Chlorhexidine Gluconate Cloth  6 each Topical Q0600   docusate sodium  100 mg Oral BID   enoxaparin (LOVENOX) injection  40 mg Subcutaneous Q24H   feeding supplement (PROSource TF20)  60 mL Per Tube Daily   folic acid  1 mg Per Tube Daily   free water  150 mL Per Tube Q4H   insulin aspart  0-15 Units Subcutaneous Q4H   insulin glargine-yfgn  12 Units Subcutaneous BID   lactose free nutrition  237 mL Oral TID WC   megestrol  40 mg Per Tube Daily   multivitamin  1 tablet Oral Daily  multivitamin with minerals  1 tablet Per Tube BID   mouth rinse  15 mL Mouth Rinse 4 times per day   pantoprazole  40 mg Oral Daily   polyethylene glycol  17 g Per Tube BID   tamsulosin  0.4 mg Oral Daily   thiamine  100 mg Per Tube Daily   Continuous Infusions:  feeding supplement (GLUCERNA 1.2 CAL) 1,000 mL (05/08/23 1422)   lactated ringers 50 mL/hr at 05/09/23 0700     PRN Meds:.acetaminophen **OR** acetaminophen, dextrose, [DISCONTINUED]  ondansetron **OR** ondansetron (ZOFRAN) IV, mouth rinse, sorbitol  Antibiotics  :    Anti-infectives (From admission, onward)    None         Objective:   Vitals:   05/08/23 2000 05/08/23 2328 05/09/23 0536 05/09/23 0800  BP: 119/62 139/76 133/68 135/83  Pulse: 89 88 93 96  Resp: 14 14 18 16   Temp: 99.1 F (37.3 C) 98.9 F (37.2 C) 98.7 F (37.1 C) 98.6 F (37 C)  TempSrc: Oral  Oral Oral  SpO2: 95% 96% 94% 97%  Weight:      Height:        Wt Readings from Last 3 Encounters:  05/06/23 67.4 kg  03/19/21 72.9 kg  12/18/20 71.7 kg     Intake/Output Summary (Last 24 hours) at 05/09/2023 0955 Last data filed at 05/09/2023 1610 Gross per 24 hour  Intake --  Output 2175 ml  Net -2175 ml      Physical Exam  Awake Alert, frail, deconditioned, appears much older than stated age, impaired judgment and insight Symmetrical Chest wall movement, Good air movement bilaterally, CTAB RRR,No Gallops,Rubs or new Murmurs, No Parasternal Heave +ve B.Sounds, Abd Soft, No tenderness, No rebound - guarding or rigidity. No Cyanosis, Clubbing or edema, No new Rash or bruise       Data Review:    Recent Labs  Lab 05/03/23 0423 05/04/23 0234 05/05/23 1120 05/06/23 0551 05/07/23 0333 05/08/23 0322 05/09/23 0428  WBC 7.8 6.5 4.6 4.8 5.1 7.8 9.8  HGB 9.6* 9.5* 10.3* 8.5* 8.4* 8.9* 9.1*  HCT 28.5* 27.9* 30.4* 25.7* 25.6* 27.6* 28.1*  PLT 271 265 271 223 217 232 256  MCV 84.3 84.0 87.1 86.0 86.2 87.6 88.4  MCH 28.4 28.6 29.5 28.4 28.3 28.3 28.6  MCHC 33.7 34.1 33.9 33.1 32.8 32.2 32.4  RDW 16.0* 15.8* 15.8* 15.8* 15.7* 15.8* 15.9*  LYMPHSABS 0.8 0.6* 0.6* 0.8 0.9  --   --   MONOABS 0.5 0.5 0.3 0.4 0.6  --   --   EOSABS 0.1 0.1 0.0 0.0 0.1  --   --   BASOSABS 0.0 0.0 0.0 0.0 0.0  --   --     Recent Labs  Lab 05/03/23 0423 05/04/23 0234 05/04/23 1708 05/05/23 1120 05/05/23 1822 05/06/23 0551 05/07/23 0333 05/07/23 1224 05/08/23 0322 05/09/23 0428  NA 143 141   --  140  --  144 143  --  142 143  K 3.6 3.7  --  3.4*  --  3.1* 3.6  --  3.9 3.8  CL 108 105  --  105  --  104 106  --  104 104  CO2 24 27  --  25  --  29 29  --  29 28  ANIONGAP 11 9  --  10  --  11 8  --  9 11  GLUCOSE 229* 263*  --  443*  --  150* 174*  --  239* 208*  BUN 25* 20  --  21  --  35* 48*  --  60* 67*  CREATININE 1.18* 1.04*  --  0.92  --  0.99 1.10*  --  1.28* 0.94  INR 1.0  --   --   --   --   --   --   --   --   --   TSH  --   --   --   --   --   --   --  0.110*  --   --   BNP 873.9* 971.2*  --  1,106.7*  --  665.9*  --   --   --   --   MG 2.2 1.9 2.0 2.0 1.9 1.9  --   --   --   --   CALCIUM 8.7* 8.6*  --  8.5*  --  8.2* 8.8*  --  9.3 9.4      Recent Labs  Lab 05/03/23 0423 05/04/23 0234 05/04/23 1708 05/05/23 1120 05/05/23 1822 05/06/23 0551 05/07/23 0333 05/07/23 1224 05/08/23 0322 05/09/23 0428  INR 1.0  --   --   --   --   --   --   --   --   --   TSH  --   --   --   --   --   --   --  0.110*  --   --   BNP 873.9* 971.2*  --  1,106.7*  --  665.9*  --   --   --   --   MG 2.2 1.9 2.0 2.0 1.9 1.9  --   --   --   --   CALCIUM 8.7* 8.6*  --  8.5*  --  8.2* 8.8*  --  9.3 9.4    --------------------------------------------------------------------------------------------------------------- Lab Results  Component Value Date   CHOL 240 (H) 09/02/2017   HDL 69.90 09/02/2017   LDLCALC 149 (H) 09/02/2017   LDLDIRECT 210.4 05/14/2013   TRIG 106.0 09/02/2017   CHOLHDL 3 09/02/2017    Lab Results  Component Value Date   HGBA1C 9.9 (H) 04/24/2023   Recent Labs    05/07/23 1224  TSH 0.110*  FREET4 2.89*     No results for input(s): "VITAMINB12", "FOLATE", "FERRITIN", "TIBC", "IRON", "RETICCTPCT" in the last 72 hours.   ------------------------------------------------------------------------------------------------------------------ Cardiac Enzymes No results for input(s): "CKMB", "TROPONINI", "MYOGLOBIN" in the last 168 hours.  Invalid input(s):  "CK"  Micro Results No results found for this or any previous visit (from the past 240 hour(s)).   Radiology Reports No results found.    Signature  -   Huey Bienenstock M.D on 05/09/2023 at 9:55 AM   -  To page go to [email or web address]

## 2023-05-10 DIAGNOSIS — R4182 Altered mental status, unspecified: Secondary | ICD-10-CM | POA: Diagnosis not present

## 2023-05-10 DIAGNOSIS — E039 Hypothyroidism, unspecified: Secondary | ICD-10-CM | POA: Diagnosis not present

## 2023-05-10 DIAGNOSIS — E512 Wernicke's encephalopathy: Secondary | ICD-10-CM

## 2023-05-10 LAB — BASIC METABOLIC PANEL
Anion gap: 11 (ref 5–15)
BUN: 68 mg/dL — ABNORMAL HIGH (ref 8–23)
CO2: 28 mmol/L (ref 22–32)
Calcium: 9.8 mg/dL (ref 8.9–10.3)
Chloride: 105 mmol/L (ref 98–111)
Creatinine, Ser: 1.04 mg/dL — ABNORMAL HIGH (ref 0.44–1.00)
GFR, Estimated: 60 mL/min — ABNORMAL LOW (ref >60)
Glucose, Bld: 149 mg/dL — ABNORMAL HIGH (ref 70–99)
Potassium: 4 mmol/L (ref 3.5–5.1)
Sodium: 144 mmol/L (ref 135–145)

## 2023-05-10 LAB — CBC
HCT: 31.5 % — ABNORMAL LOW (ref 36.0–46.0)
Hemoglobin: 10.2 g/dL — ABNORMAL LOW (ref 12.0–15.0)
MCH: 28.4 pg (ref 26.0–34.0)
MCHC: 32.4 g/dL (ref 30.0–36.0)
MCV: 87.7 fL (ref 80.0–100.0)
Platelets: 324 10*3/uL (ref 150–400)
RBC: 3.59 MIL/uL — ABNORMAL LOW (ref 3.87–5.11)
RDW: 16.1 % — ABNORMAL HIGH (ref 11.5–15.5)
WBC: 8.3 10*3/uL (ref 4.0–10.5)
nRBC: 1.7 % — ABNORMAL HIGH (ref 0.0–0.2)

## 2023-05-10 LAB — GLUCOSE, CAPILLARY
Glucose-Capillary: 124 mg/dL — ABNORMAL HIGH (ref 70–99)
Glucose-Capillary: 126 mg/dL — ABNORMAL HIGH (ref 70–99)
Glucose-Capillary: 96 mg/dL (ref 70–99)
Glucose-Capillary: 165 mg/dL — ABNORMAL HIGH (ref 70–99)
Glucose-Capillary: 179 mg/dL — ABNORMAL HIGH (ref 70–99)
Glucose-Capillary: 146 mg/dL — ABNORMAL HIGH (ref 70–99)

## 2023-05-10 LAB — PROCALCITONIN: Procalcitonin: 0.39 ng/mL

## 2023-05-10 MED ORDER — INSULIN ASPART 100 UNIT/ML IJ SOLN
0.0000 [IU] | Freq: Three times a day (TID) | INTRAMUSCULAR | Status: DC
  Administered 2023-05-10: 2 [IU] via SUBCUTANEOUS
  Administered 2023-05-11: 3 [IU] via SUBCUTANEOUS
  Administered 2023-05-11 (×2): 2 [IU] via SUBCUTANEOUS

## 2023-05-10 MED ORDER — FAMOTIDINE 20 MG PO TABS
20.0000 mg | ORAL_TABLET | Freq: Every day | ORAL | Status: DC
  Administered 2023-05-10 – 2023-05-15 (×6): 20 mg
  Filled 2023-05-10 (×6): qty 1

## 2023-05-10 MED ORDER — LEVOTHYROXINE SODIUM 75 MCG PO TABS
75.0000 ug | ORAL_TABLET | Freq: Every day | ORAL | Status: DC
  Administered 2023-05-10 – 2023-05-20 (×9): 75 ug via ORAL
  Filled 2023-05-10 (×11): qty 1

## 2023-05-10 NOTE — Consult Note (Signed)
NEUROLOGY CONSULT NOTE   Date of service: May 10, 2023 Patient Name: Katherine Navarro MRN:  [id number] DOB:  06/06/1956 Chief Complaint: "persistent encephalopathy and failure to thrive" Requesting Provider: Starleen Arms, MD  History of Present Illness  Katherine Navarro is a 67 y.o. female hx of DM1 on insulin, hypothyroidism, HTN who presented with lethargy, somnolence to the ED. She was found to have severe hypothyroidism with TSH of 44 and undetectable T4 in the setting of noncompliance with home synthroid. She was started on IV synthroid. Further workup for encephalopathy with B12 of 762, Folate of 2.4 and being replaced, repeat TSH of 0.110, rEEG with mild diffuse encephalopath and no seizures or epileptiform discharges. Ammonia normal, cortisol levels normal. B1 was < 20.   She had been sleepy for weeks prior to presentation, and reported that it been a very long time since she had her thyroid medicine.  Her mental status was attributed to this, however, thankfully the admitting physician thought to check B1 as well and began high-dose thiamine repletion with 500 mg 3 times daily from 11/02 until 11/04.     has a past medical history of Complication of anesthesia, CONSTIPATION, CHRONIC (12/13/2007), DIABETES MELLITUS, TYPE I (01/07/2007), DM nephropathy/sclerosis, GLAUCOMA (12/13/2007), HYPERCHOLESTEROLEMIA (12/13/2007), Hypertension, Hyperthyroidism, INTERNAL HEMORRHOIDS (07/23/2008), LEUKOPENIA, CHRONIC (12/13/2007), Nonproliferative diabetic retinopathy MWN(027.25) (12/13/2007), Unspecified hypothyroidism (12/13/2007), and VARICOSE VEINS, LOWER EXTREMITIES (12/13/2007).      Past History   Past Medical History:  Diagnosis Date   Complication of anesthesia    trouble waking up   CONSTIPATION, CHRONIC 12/13/2007   DIABETES MELLITUS, TYPE I 01/07/2007   DM nephropathy/sclerosis    GLAUCOMA 12/13/2007   HYPERCHOLESTEROLEMIA 12/13/2007   Hypertension    Hyperthyroidism    INTERNAL  HEMORRHOIDS 07/23/2008   LEUKOPENIA, CHRONIC 12/13/2007   Nonproliferative diabetic retinopathy NOS(362.03) 12/13/2007   Unspecified hypothyroidism 12/13/2007   VARICOSE VEINS, LOWER EXTREMITIES 12/13/2007    Past Surgical History:  Procedure Laterality Date   ANTERIOR CERVICAL DECOMP/DISCECTOMY FUSION N/A 02/06/2020   Procedure: Anterior Cervical Decompression Discectomy Fusion Cervical five-six;  Surgeon: Donalee Citrin, MD;  Location[facility];  Service: Neurosurgery;  Laterality: N/A;   ELECTROCARDIOGRAM  08/24/2006   EYE SURGERY     bilateral   LEEP N/A 12/01/2012   Procedure: LOOP ELECTROSURGICAL EXCISION PROCEDURE (LEEP);  Surgeon: Purcell Nails, MD;  Location: [facility] ORS;  Service: Gynecology;  Laterality: N/A;   Stress Myoview   05/18/2004   TUBAL LIGATION      Family History: Family History  Problem Relation Age of Onset   Cancer Mother        Breast Cancer   Diabetes Mother    Breast cancer Mother    Diabetes Father     Social History  reports that she has never smoked. She has never used smokeless tobacco. She reports current alcohol use of about 1.0 standard drink of alcohol per week. She reports that she does not use drugs.  Allergies  Allergen Reactions   Atorvastatin     REACTION: perceived myalgias   Ciprofloxacin Nausea And Vomiting   Epinephrine     REACTION: thyroid problems   Erythromycin     Can not recall   Morphine Nausea Only    Headache   Peanut-Containing Drug Products Hives   Penicillins Hives    Medications   Current Facility-Administered Medications:    (feeding supplement) PROSource Plus liquid 30 mL, 30 mL, Oral, BID BM, Thedore Mins, Stanford Scotland, MD, 30 mL  at 05/10/23 0940   acetaminophen (TYLENOL) tablet 650 mg, 650 mg, Per Tube, Q6H PRN **OR** acetaminophen (TYLENOL) suppository 650 mg, 650 mg, Rectal, Q6H PRN, Elgergawy, Leana Roe, MD   Chlorhexidine Gluconate Cloth 2 % PADS 6 each, 6 each, Topical, Q0600, Leroy Sea, MD, 6 each at 05/10/23 0401    dextrose 50 % solution 0-50 mL, 0-50 mL, Intravenous, PRN, Buena Irish, MD, 50 mL at 04/26/23 1227   docusate sodium (COLACE) capsule 100 mg, 100 mg, Oral, BID, Buena Irish, MD, 100 mg at 05/08/23 2052   enoxaparin (LOVENOX) injection 40 mg, 40 mg, Subcutaneous, Q24H, Elgergawy, Leana Roe, MD, 40 mg at 05/10/23 1211   famotidine (PEPCID) tablet 20 mg, 20 mg, Per Tube, Daily, Pham, Minh Q, RPH-CPP, 20 mg at 05/10/23 1211   feeding supplement (PROSource TF20) liquid 60 mL, 60 mL, Per Tube, Daily, Elgergawy, Leana Roe, MD, 60 mL at 05/10/23 0940   folic acid (FOLVITE) tablet 1 mg, 1 mg, Per Tube, Daily, Elgergawy, Leana Roe, MD, 1 mg at 05/10/23 0940   insulin aspart (novoLOG) injection 0-9 Units, 0-9 Units, Subcutaneous, TID WC, Elgergawy, Leana Roe, MD   lactose free nutrition (BOOST PLUS) liquid 237 mL, 237 mL, Oral, TID WC, Leroy Sea, MD, 237 mL at 05/08/23 1251   levothyroxine (SYNTHROID) tablet 75 mcg, 75 mcg, Oral, Q0600, Elgergawy, Leana Roe, MD, 75 mcg at 05/10/23 0939   multivitamin (PROSIGHT) tablet 1 tablet, 1 tablet, Oral, Daily, Elgergawy, Leana Roe, MD, 1 tablet at 05/10/23 0947   [DISCONTINUED] ondansetron (ZOFRAN) tablet 4 mg, 4 mg, Oral, Q6H PRN **OR** ondansetron (ZOFRAN) injection 4 mg, 4 mg, Intravenous, Q6H PRN, Buena Irish, MD   Oral care mouth rinse, 15 mL, Mouth Rinse, 4 times per day, Leroy Sea, MD, 15 mL at 05/10/23 1208   Oral care mouth rinse, 15 mL, Mouth Rinse, PRN, Leroy Sea, MD   polyethylene glycol (MIRALAX / GLYCOLAX) packet 17 g, 17 g, Per Tube, BID, Elgergawy, Leana Roe, MD, 17 g at 05/08/23 2046   sorbitol 70 % solution 30 mL, 30 mL, Oral, Daily PRN, Buena Irish, MD   tamsulosin [facility]) capsule 0.4 mg, 0.4 mg, Oral, Daily, Susa Raring K, MD, 0.4 mg at 05/10/23 4098   thiamine (VITAMIN B1) tablet 100 mg, 100 mg, Per Tube, Daily, Elgergawy, Leana Roe, MD, 100 mg at 05/10/23 0940  Vitals   Vitals:   05/10/23 0500  05/10/23 0730 05/10/23 1115 05/10/23 1200  BP:  133/71 121/65 119/68  Pulse:  96 90 89  Resp:  12 18 18   Temp:  99.3 F (37.4 C) 99.3 F (37.4 C)   TempSrc:  Oral Oral   SpO2:  94% 96% 98%  Weight: 67.3 kg     Height:        Body mass index is 26.28 kg/m.  Physical Exam   Constitutional: Appears well-developed and well-nourished.   Neurologic Examination   Of note, exam was performed late in the evening and nursing reports that she regularly is less interactive at this point.   Neuro: Mental Status: Patient is very lethargic, with repeated stimulation, I am able to get her to wake up and tell me her name, she does not answer other questions and does have some perseveration. Cranial Nerves: II: She does not fixate or track.  Her left pupil is fixed and I suspect of limited vision, right pupil does have some reactivity but is markedly irregular.  She does not blink to  threat III,IV, VI: Her eyes are disconjugate with the left eye slightly outwardly deviated compared to the right, she does not cooperate with EOM testing, and resists doll's maneuver. VII: Facial movement is grossly symmetric.  Motor: She has increased tone versus paratonia in all four extremities, she does move all extremities spontaneously and to noxious stimulation, moves her arms more than her legs.  I also question if she is moving her right arm slightly less than her left. Sensory: She response to noxious stimulation in all four extremities Cerebellar: Does not perform   Labs/Imaging/Neurodiagnostic studies   CBC:  Recent Labs  Lab 05/16/23 0551 05/07/23 0333 05/08/23 0322 05/09/23 0428 05/10/23 0515  WBC 4.8 5.1   < > 9.8 8.3  NEUTROABS 3.5 3.4  --   --   --   HGB 8.5* 8.4*   < > 9.1* 10.2*  HCT 25.7* 25.6*   < > 28.1* 31.5*  MCV 86.0 86.2   < > 88.4 87.7  PLT 223 217   < > 256 324   < > = values in this interval not displayed.   Basic Metabolic Panel:  Lab Results  Component Value Date    NA 144 05/10/2023   K 4.0 05/10/2023   CO2 28 05/10/2023   GLUCOSE 149 (H) 05/10/2023   BUN 68 (H) 05/10/2023   CREATININE 1.04 (H) 05/10/2023   CALCIUM 9.8 05/10/2023   GFRNONAA 60 (L) 05/10/2023   GFRAA >60 02/07/2020   Lipid Panel:  Lab Results  Component Value Date   LDLCALC 149 (H) 09/02/2017   HgbA1c:  Lab Results  Component Value Date   HGBA1C 9.9 (H) 04/24/2023   Urine Drug Screen:     Component Value Date/Time   LABOPIA NONE DETECTED 04/23/2023 1715   COCAINSCRNUR NONE DETECTED 04/23/2023 1715   LABBENZ NONE DETECTED 04/23/2023 1715   AMPHETMU NONE DETECTED 04/23/2023 1715   THCU NONE DETECTED 04/23/2023 1715   LABBARB NONE DETECTED 04/23/2023 1715    Alcohol Level No results found for: "ETH" INR  Lab Results  Component Value Date   INR 1.0 05/03/2023   APTT No results found for: "APTT" AED levels: No results found for: "PHENYTOIN", "ZONISAMIDE", "LAMOTRIGINE", "LEVETIRACETA"   MRI Brain(Personally reviewed): She has significant atrophy and white matter disease, but no acute findings  Neurodiagnostics rEEG:  Mild encephalopathy, no epileptiform discharges  ASSESSMENT   TOSCA BRANDIS is a 67 y.o. female with progressive encephalopathy over the course of a few weeks who was found to have severe thiamine deficiency and thyroid dysfunction.  The MRI does not show findings of Wernicke's encephalopathy, but these findings are commonly reversible with thiamine repletion which she received a couple of weeks prior to imaging.  I suspect that Wernicke's is playing a role, though with a confounder of her thyroid dysfunction which can take quite a long time to improve. I think any type of prognostication is going to be difficult for quite a long time.  She may take weeks to gradually improve, though it is certainly possible that she will plateau at some point.  Treatment will be primarily supportive at this point.  She has had a fairly comprehensive workup at this  point, and given that we have clear findings that could explain her symptoms, I do not think that further workup is likely to be very beneficial.  Thyroid autoantibodies for Hashimoto's encephalopathy have been sent, and these will need to be followed.   RECOMMENDATIONS  Continue oral thiamine repletion  Continue optimization of thyroid treatment We will follow-up thyroid autoantibodies, but otherwise be available on an as-needed basis. ______________________________________________________________________  Stormy Card, MD [facility]

## 2023-05-10 NOTE — TOC Progression Note (Signed)
Transition of Care [facility]) - Progression Note    Patient Details  Name: Katherine Navarro MRN: [id number] Date of Birth: 1955-06-26  Transition of Care [facility]) CM/SW Contact  Erin Sons, [place] Phone Number: 05/10/2023, 11:29 AM  Clinical Narrative:     CSW called and left message with pts spouse requesting return call.   1220: CSW called pts spouse; no answer again  CSW tried calling pt's home phone. No answer; left voicemail requesting return call.   Expected Discharge Plan: Skilled Nursing Facility Barriers to Discharge: Continued Medical Work up, English as a second language teacher, SNF Pending bed offer  Expected Discharge Plan and Services In-house Referral: Clinical Social Work   Post Acute Care Choice: Skilled Nursing Facility Living arrangements for the past 2 months: Single Family Home                                       Social Determinants of Health (SDOH) Interventions SDOH Screenings   Food Insecurity: No Food Insecurity (04/25/2023)  Housing: Low Risk  (04/25/2023)  Transportation Needs: No Transportation Needs (04/25/2023)  Utilities: Not At Risk (04/25/2023)  Depression (PHQ2-9): Low Risk  (10/31/2019)  Tobacco Use: Low Risk  (04/22/2023)    Readmission Risk Interventions     No data to display

## 2023-05-10 NOTE — Plan of Care (Signed)

## 2023-05-10 NOTE — Progress Notes (Addendum)
PROGRESS NOTE                                                                                                                                                                                                             Patient Demographics:    Katherine Navarro, is a 67 y.o. female, DOB - 11-22-1955, EGB:[id number]  Outpatient Primary MD for the patient is Jarold Motto, [place]    LOS - 18  Admit date - 04/22/2023    Chief Complaint  Patient presents with   Weakness       Brief Narrative (HPI from H&P)      DM-1 supposed to be on insulin pump, hypothyroidism and HTN brought to ED with progressive sleepiness over weeks, and admitted for metabolic encephalopathy in the setting of severe hypothyroidism with elevated TSH to 44 and undetectable free T4.  She apparently was noncompliant with her Synthroid medication, head CT, EEG, ABG, ammonia level stable.  She appears to be in myxedema coma, failure to thrive, improving with IV Synthroid, but mentation not back to baseline, she remained with significant poor oral intake and failure to thrive.  So tube feed was started 11/15, she remains significantly lethargic, weak frail deconditioned with poor appetite despite tube feedings, so has been held on 11/18 to see if this improves her appetite.   Subjective:   Patient is lethargic this morning, cannot provide any complaints, no significant events as discussed with staff, I have discussed with husband close that he was here yesterday evening, where she had a good meal and she was more interactive with him yesterday night.   Assessment  & Plan :   Metabolic encephalopathy Myxedema coma.  -Does appear to be with significant underlying vascular dementia at baseline.  But she is worse than her baseline. -stable head CT, EEG, ammonia and ABG, no focal deficits, - Due to noncompliance with Synthroid, TSH more than 40, free T4 initially  undetectable, though she was treated with high-dose IV Synthroid.  -Mentation has improved, but she remains somnolent, weak, frail and altered with very poor oral intake. -Repeat TSH and free T4 on 11/16, with difficultly low TSH, and elevated free T4, so her Synthroid has been held for 3 Dialose, and resumed at a much lower dose, she will need close monitoring of her TSH and free T4.  Marland Kitchen -  She was IV thiamine on admission, continue with p.o. thiamine -MRI brain with no acute CVA, Generalized volume loss. Advancedchronic small-vessel ischemic changes of the cerebral hemispheric white matter. -Neurology consulted for further recommendation given her persistent encephalopathy -Patient appears to be more sleepy today, she was started on Remeron yesterday, so I will discontinue for now.   Poor Oral intake due to above.   Failure to thrive  Moderate protein calorie malnutrition -mentation overall has improved, but remains with poor mentation, very poor oral intake . -Cortrak inserted and tube feed attempted for last 4 days, but despite that remains with no significant change, so tube feed has been held. -Discussed with staff, they will keep trying to encourage her oral intake, but it does seem she is the best when her husband is here and he is feeding her.  AKI on CKD 3A.  - Not eating well at all, she has been adequately treated, encouraged to improve oral intake, Megace asdded, foley.  Renal function improving.  Urinary retention.  Foley Flomax. -Foley removal, was reinserted . -Still have urinary retention even with Foley inserted, due to significant sediment, so started on Foley flushes, sediment still present, buttock clearing, no further retention.  Hypophosphatemia, hypokalemia.  -Repleted, monitor closely  Chronic anemia with evidence of acute worsening due to heme dilution.  - No signs of GI bleeding, does have right upper extremity hematoma, replace folic acid and thiamine as they were  deficient, placed on PPI transfuse 1 unit on 05/02/2023.  Outpatient anemia workup.  Right upper arm hematoma and swelling.   Stable venous ultrasound, does have a hematoma in the right arm, patient does not recall hurting it, pain and swelling have improved, remove any IV access from the right arm, keep right arm elevated, since she has a moderate to large hematoma stop Lovenox and switch to SCDs.   -Hemoglobin remained stable, as well right upper extremity hematoma and ecchymosis stable, resumed Lovenox  Likely undiagnosed OSA.  Nighttime oxygen with outpatient sleep study.    DM type I.  BG uncontrolled, but I will hold on increasing her Semglee now I will be holding her tube feeds .   CBG (last 3)  Recent Labs    05/10/23 0342 05/10/23 0459 05/10/23 0828  GLUCAP 124* 126* 96   Lab Results  Component Value Date   HGBA1C 9.9 (H) 04/24/2023         Condition - Extremely Guarded  Family Communication  : None at bedside today, discussed with husband by phone today as well.  Code Status :  Full  Consults  :  Neurology  PUD Prophylaxis :    Procedures  :     Right upper extremity venous duplex.  No blood clots.    Renal ultrasound.  Nonacute.    CT head and EEG - non acute      Disposition Plan  :    Status is: Inpatient   DVT Prophylaxis  : Lovenox   enoxaparin (LOVENOX) injection 40 mg Start: 05/07/23 1245 Place and maintain sequential compression device Start: 05/03/23 1035  Lab Results  Component Value Date   PLT 324 05/10/2023    Diet :  Diet Order             DIET DYS 3 Room service appropriate? Yes with Assist; Fluid consistency: Thin  Diet effective now                    Inpatient Medications  Scheduled Meds:  (feeding supplement) PROSource Plus  30 mL Oral BID BM   Chlorhexidine Gluconate Cloth  6 each Topical Q0600   docusate sodium  100 mg Oral BID   enoxaparin (LOVENOX) injection  40 mg Subcutaneous Q24H   famotidine  20 mg  Per Tube Daily   feeding supplement (PROSource TF20)  60 mL Per Tube Daily   folic acid  1 mg Per Tube Daily   insulin aspart  0-9 Units Subcutaneous TID WC   lactose free nutrition  237 mL Oral TID WC   levothyroxine  75 mcg Oral Q0600   mirtazapine  15 mg Oral QHS   multivitamin  1 tablet Oral Daily   mouth rinse  15 mL Mouth Rinse 4 times per day   polyethylene glycol  17 g Per Tube BID   tamsulosin  0.4 mg Oral Daily   thiamine  100 mg Per Tube Daily   Continuous Infusions:     PRN Meds:.acetaminophen **OR** acetaminophen, dextrose, [DISCONTINUED] ondansetron **OR** ondansetron (ZOFRAN) IV, mouth rinse, sorbitol  Antibiotics  :    Anti-infectives (From admission, onward)    None         Objective:   Vitals:   05/10/23 0343 05/10/23 0500 05/10/23 0730 05/10/23 1115  BP:   133/71 121/65  Pulse:   96 90  Resp:   12 11  Temp: 99.2 F (37.3 C)  99.3 F (37.4 C) 99.3 F (37.4 C)  TempSrc: Axillary  Oral Oral  SpO2:   94% 96%  Weight:  67.3 kg    Height:        Wt Readings from Last 3 Encounters:  05/10/23 67.3 kg  03/19/21 72.9 kg  12/18/20 71.7 kg     Intake/Output Summary (Last 24 hours) at 05/10/2023 1126 Last data filed at 05/10/2023 8657 Gross per 24 hour  Intake 5481.7 ml  Output 2650 ml  Net 2831.7 ml      Physical Exam  He is somnolent this morning, open her eyes answer couple questions go back to sleep, but she remains with impaired judgment and side, extremely frail, deconditioned, left eye defecation at baseline.  Awake Alert, Oriented X 3, No new F.N deficits, Normal affect Symmetrical Chest wall movement, Good air movement bilaterally, CTAB RRR,No Gallops,Rubs or new Murmurs, No Parasternal Heave +ve B.Sounds, Abd Soft, No tenderness, No rebound - guarding or rigidity. No Cyanosis, Clubbing or edema, No new Rash or bruise       Data Review:    Recent Labs  Lab 05/04/23 0234 05/05/23 1120 05/06/23 0551 05/07/23 0333  05/08/23 0322 05/09/23 0428 05/10/23 0515  WBC 6.5 4.6 4.8 5.1 7.8 9.8 8.3  HGB 9.5* 10.3* 8.5* 8.4* 8.9* 9.1* 10.2*  HCT 27.9* 30.4* 25.7* 25.6* 27.6* 28.1* 31.5*  PLT 265 271 223 217 232 256 324  MCV 84.0 87.1 86.0 86.2 87.6 88.4 87.7  MCH 28.6 29.5 28.4 28.3 28.3 28.6 28.4  MCHC 34.1 33.9 33.1 32.8 32.2 32.4 32.4  RDW 15.8* 15.8* 15.8* 15.7* 15.8* 15.9* 16.1*  LYMPHSABS 0.6* 0.6* 0.8 0.9  --   --   --   MONOABS 0.5 0.3 0.4 0.6  --   --   --   EOSABS 0.1 0.0 0.0 0.1  --   --   --   BASOSABS 0.0 0.0 0.0 0.0  --   --   --     Recent Labs  Lab 05/04/23 0234 05/04/23 1708 05/05/23 1120 05/05/23 1822 05/06/23 0551  05/07/23 0333 05/07/23 1224 05/08/23 0322 05/09/23 0428 05/10/23 0515  NA 141  --  140  --  144 143  --  142 143 144  K 3.7  --  3.4*  --  3.1* 3.6  --  3.9 3.8 4.0  CL 105  --  105  --  104 106  --  104 104 105  CO2 27  --  25  --  29 29  --  29 28 28   ANIONGAP 9  --  10  --  11 8  --  9 11 11   GLUCOSE 263*  --  443*  --  150* 174*  --  239* 208* 149*  BUN 20  --  21  --  35* 48*  --  60* 67* 68*  CREATININE 1.04*  --  0.92  --  0.99 1.10*  --  1.28* 0.94 1.04*  PROCALCITON  --   --   --   --   --   --   --   --   --  0.39  TSH  --   --   --   --   --   --  0.110*  --   --   --   BNP 971.2*  --  1,106.7*  --  665.9*  --   --   --   --   --   MG 1.9 2.0 2.0 1.9 1.9  --   --   --   --   --   CALCIUM 8.6*  --  8.5*  --  8.2* 8.8*  --  9.3 9.4 9.8      Recent Labs  Lab 05/04/23 0234 05/04/23 1708 05/05/23 1120 05/05/23 1822 05/06/23 0551 05/07/23 0333 05/07/23 1224 05/08/23 0322 05/09/23 0428 05/10/23 0515  PROCALCITON  --   --   --   --   --   --   --   --   --  0.39  TSH  --   --   --   --   --   --  0.110*  --   --   --   BNP 971.2*  --  1,106.7*  --  665.9*  --   --   --   --   --   MG 1.9 2.0 2.0 1.9 1.9  --   --   --   --   --   CALCIUM 8.6*  --  8.5*  --  8.2* 8.8*  --  9.3 9.4 9.8     --------------------------------------------------------------------------------------------------------------- Lab Results  Component Value Date   CHOL 240 (H) 09/02/2017   HDL 69.90 09/02/2017   LDLCALC 149 (H) 09/02/2017   LDLDIRECT 210.4 05/14/2013   TRIG 106.0 09/02/2017   CHOLHDL 3 09/02/2017    Lab Results  Component Value Date   HGBA1C 9.9 (H) 04/24/2023   Recent Labs    05/07/23 1224  TSH 0.110*  FREET4 2.89*     Recent Labs    05/09/23 0428  FOLATE 8.8     ------------------------------------------------------------------------------------------------------------------ Cardiac Enzymes No results for input(s): "CKMB", "TROPONINI", "MYOGLOBIN" in the last 168 hours.  Invalid input(s): "CK"  Micro Results No results found for this or any previous visit (from the past 240 hour(s)).   Radiology Reports MR BRAIN WO CONTRAST  Result Date: 05/09/2023 CLINICAL DATA:  Altered mental status EXAM: MRI HEAD WITHOUT CONTRAST TECHNIQUE: Multiplanar, multiecho pulse sequences of the brain and surrounding structures were obtained without intravenous  contrast. COMPARISON:  Head CT 04/22/2023 FINDINGS: Brain: Diffusion imaging does not show any acute or subacute infarction or other cause of restricted diffusion. No focal abnormality affects the brainstem or cerebellum. Cerebral hemispheres show generalized volume loss. There are advanced chronic small-vessel ischemic changes of the white matter. No cortical or large vessel territory infarction. No mass lesion, hemorrhage, hydrocephalus or extra-axial collection. Vascular: Major vessels at the base of the brain show flow. Skull and upper cervical spine: Negative Sinuses/Orbits: Sinuses are clear.  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 Other: None IMPRESSION: No acute or reversible finding. Generalized volume loss. Advanced chronic small-vessel ischemic changes of the cerebral hemispheric white matter. Electronically Signed   By: Paulina Fusi M.D.   On: 05/09/2023 16:37      Signature  -   Huey Bienenstock M.D on 05/10/2023 at 11:26 AM   -  To page go to [email or web address]

## 2023-05-10 NOTE — Progress Notes (Signed)
Occupational Therapy Treatment Patient Details Name: Katherine Navarro MRN: [id number] DOB: 1955/11/12 Today's Date: 05/10/2023   History of present illness Pt is a 67 y/o F presenting to ED on 11/1 with progressive sleepiness, CT head negative, Admitted for metabolic encephalopathy in setting of hypothyroidism with elevated TSH. PMH includes DM1, hypothyroidism, HTN, diabetic retinopathy   OT comments  Pt was seen today and was unable to participate due to lethargy. Only responded to sternal rub (not to nail bed pressure x4 extremities nor cold/warm washcloth to face). She did spontaneously move her arms twice while I was with her. I repositioned pt in recliner, propped up both arms, and suspended heels off of recliner leg rest. Will continue to follow.      If plan is discharge home, recommend the following:  Two people to help with walking and/or transfers;Assistance with cooking/housework;Assistance with feeding;Help with stairs or ramp for entrance;Assist for transportation;Direct supervision/assist for medications management;Two people to help with bathing/dressing/bathroom;Direct supervision/assist for financial management   Equipment Recommendations  Other (comment) (TBD next venue)       Precautions / Restrictions Precautions Precautions: Fall Precaution Comments: cortrak Restrictions Weight Bearing Restrictions: No       Mobility Bed Mobility               General bed mobility comments: up in recliner upon arrival via hoyer lift           ADL either performed or assessed with clinical judgement   ADL Overall ADL's : Needs assistance/impaired                                       General ADL Comments: total A    Extremity/Trunk Assessment Upper Extremity Assessment RUE Deficits / Details: moves both arms spontaneously (L>R)            Vision   Additional Comments: Left eye blind per chart review; kept eyes shut the whole session           Cognition Arousal: Alert Behavior During Therapy: Flat affect Overall Cognitive Status: No family/caregiver present to determine baseline cognitive functioning                                 General Comments: Pt not responding to commands, or name said, nor nail bed pressure on any of 4 extremities (only to sternal rub). She did however move spontnaeously both arms.                   Pertinent Vitals/ Pain       Pain Assessment Pain Assessment: Faces Pain Location: in sternum with sternal rub Pain Descriptors / Indicators: Moaning, Grimacing         Frequency  Min 1X/week        Progress Toward Goals  OT Goals(current goals can now be found in the care plan section)  Progress towards OT goals: Not progressing toward goals - comment (lethargic, only responding to sternal rub (RN aware and this is how she has been all day))  Acute Rehab OT Goals Patient Stated Goal: unable OT Goal Formulation: Patient unable to participate in goal setting Time For Goal Achievement: 05/19/23 Potential to Achieve Goals: Poor         AM-PAC OT "6 Clicks" Daily Activity     Outcome Measure  Help from another person eating meals?: Total Help from another person taking care of personal grooming?: Total Help from another person toileting, which includes using toliet, bedpan, or urinal?: Total Help from another person bathing (including washing, rinsing, drying)?: Total Help from another person to put on and taking off regular upper body clothing?: Total Help from another person to put on and taking off regular lower body clothing?: Total 6 Click Score: 6    End of Session    OT Visit Diagnosis: Other abnormalities of gait and mobility (R26.89);Muscle weakness (generalized) (M62.81);Other symptoms and signs involving cognitive function   Activity Tolerance Patient limited by lethargy   Patient Left in chair;with call bell/phone within reach;with chair  alarm set   Nurse Communication  (repositioned in recliner so sitting more centered, arms propped up on pillows, and heels propped up so suspended in air)        Time: 3474-2595 OT Time Calculation (min): 18 min  Charges: OT General Charges $OT Visit: 1 Visit OT Treatments $Therapeutic Activity: 8-22 mins  Katherine Navarro OT Acute Rehabilitation Services Office [phone]    Katherine Navarro 05/10/2023, 4:47 PM

## 2023-05-10 NOTE — Plan of Care (Signed)
  Problem: Education: Goal: Ability to describe self-care measures that may prevent or decrease complications (Diabetes Survival Skills Education) will improve Outcome: Not Progressing Goal: Individualized Educational Video(s) Outcome: Not Progressing   Problem: Coping: Goal: Ability to adjust to condition or change in health will improve Outcome: Not Progressing   Problem: Fluid Volume: Goal: Ability to maintain a balanced intake and output will improve Outcome: Not Progressing   Problem: Health Behavior/Discharge Planning: Goal: Ability to identify and utilize available resources and services will improve Outcome: Not Progressing

## 2023-05-11 DIAGNOSIS — E440 Moderate protein-calorie malnutrition: Secondary | ICD-10-CM | POA: Diagnosis not present

## 2023-05-11 DIAGNOSIS — E039 Hypothyroidism, unspecified: Secondary | ICD-10-CM | POA: Diagnosis not present

## 2023-05-11 DIAGNOSIS — E512 Wernicke's encephalopathy: Secondary | ICD-10-CM | POA: Diagnosis not present

## 2023-05-11 DIAGNOSIS — R627 Adult failure to thrive: Secondary | ICD-10-CM | POA: Diagnosis not present

## 2023-05-11 LAB — GLUCOSE, CAPILLARY
Glucose-Capillary: 189 mg/dL — ABNORMAL HIGH (ref 70–99)
Glucose-Capillary: 219 mg/dL — ABNORMAL HIGH (ref 70–99)
Glucose-Capillary: 191 mg/dL — ABNORMAL HIGH (ref 70–99)
Glucose-Capillary: 233 mg/dL — ABNORMAL HIGH (ref 70–99)
Glucose-Capillary: 249 mg/dL — ABNORMAL HIGH (ref 70–99)

## 2023-05-11 LAB — CBC
HCT: 28.8 % — ABNORMAL LOW (ref 36.0–46.0)
Hemoglobin: 9.5 g/dL — ABNORMAL LOW (ref 12.0–15.0)
MCH: 29.3 pg (ref 26.0–34.0)
MCHC: 33 g/dL (ref 30.0–36.0)
MCV: 88.9 fL (ref 80.0–100.0)
Platelets: 371 10*3/uL (ref 150–400)
RBC: 3.24 MIL/uL — ABNORMAL LOW (ref 3.87–5.11)
RDW: 16.1 % — ABNORMAL HIGH (ref 11.5–15.5)
WBC: 7.4 10*3/uL (ref 4.0–10.5)
nRBC: 1.5 % — ABNORMAL HIGH (ref 0.0–0.2)

## 2023-05-11 LAB — BASIC METABOLIC PANEL
Anion gap: 10 (ref 5–15)
BUN: 53 mg/dL — ABNORMAL HIGH (ref 8–23)
CO2: 29 mmol/L (ref 22–32)
Calcium: 9.3 mg/dL (ref 8.9–10.3)
Chloride: 110 mmol/L (ref 98–111)
Creatinine, Ser: 0.88 mg/dL (ref 0.44–1.00)
GFR, Estimated: >60 mL/min (ref >60)
Glucose, Bld: 169 mg/dL — ABNORMAL HIGH (ref 70–99)
Potassium: 3.4 mmol/L — ABNORMAL LOW (ref 3.5–5.1)
Sodium: 149 mmol/L — ABNORMAL HIGH (ref 135–145)

## 2023-05-11 LAB — THYROGLOBULIN ANTIBODY: Thyroglobulin Antibody: <1 [IU]/mL (ref 0.0–0.9)

## 2023-05-11 LAB — THYROID PEROXIDASE ANTIBODY: Thyroperoxidase Ab SerPl-aCnc: 12 [IU]/mL (ref 0–34)

## 2023-05-11 MED ORDER — FOLIC ACID 1 MG PO TABS
2.0000 mg | ORAL_TABLET | Freq: Every day | ORAL | Status: DC
  Administered 2023-05-12 – 2023-05-15 (×4): 2 mg
  Filled 2023-05-11 (×4): qty 2

## 2023-05-11 MED ORDER — POTASSIUM CHLORIDE 20 MEQ PO PACK
40.0000 meq | PACK | Freq: Once | ORAL | Status: AC
  Administered 2023-05-11: 40 meq
  Filled 2023-05-11: qty 2

## 2023-05-11 MED ORDER — FREE WATER
300.0000 mL | Freq: Four times a day (QID) | Status: DC
  Administered 2023-05-11 – 2023-05-13 (×8): 300 mL

## 2023-05-11 MED ORDER — GLUCERNA 1.5 CAL PO LIQD
1000.0000 mL | ORAL | Status: DC
  Administered 2023-05-11 – 2023-05-14 (×3): 1000 mL
  Filled 2023-05-11 (×9): qty 1000

## 2023-05-11 NOTE — Plan of Care (Signed)
  Problem: Education: Goal: Individualized Educational Video(s) Outcome: Progressing   Problem: Coping: Goal: Ability to adjust to condition or change in health will improve Outcome: Progressing   Problem: Fluid Volume: Goal: Ability to maintain a balanced intake and output will improve Outcome: Progressing   Problem: Health Behavior/Discharge Planning: Goal: Ability to identify and utilize available resources and services will improve Outcome: Progressing   Problem: Nutritional: Goal: Maintenance of adequate nutrition will improve Outcome: Progressing Goal: Progress toward achieving an optimal weight will improve Outcome: Progressing   Problem: Skin Integrity: Goal: Risk for impaired skin integrity will decrease Outcome: Progressing   Problem: Education: Goal: Ability to describe self-care measures that may prevent or decrease complications (Diabetes Survival Skills Education) will improve Outcome: Progressing   Problem: Health Behavior/Discharge Planning: Goal: Ability to manage health-related needs will improve Outcome: Progressing   Problem: Nutritional: Goal: Maintenance of adequate nutrition will improve Outcome: Progressing   Problem: Urinary Elimination: Goal: Ability to achieve and maintain adequate renal perfusion and functioning will improve Outcome: Progressing   Problem: Health Behavior/Discharge Planning: Goal: Ability to manage health-related needs will improve Outcome: Progressing   Problem: Activity: Goal: Risk for activity intolerance will decrease Outcome: Progressing   Problem: Coping: Goal: Level of anxiety will decrease Outcome: Progressing

## 2023-05-11 NOTE — Progress Notes (Signed)
PROGRESS NOTE        PATIENT DETAILS Name: Katherine Navarro Age: 67 y.o. Sex: female Date of Birth: [birth year] Admit Date: 04/22/2023 Admitting Physician Buena Irish, MD UEA:[id number], Lelon Mast, [place]  Brief Summary: Patient is a 67 y.o.  female with history of DM-1, HTN, hypothyroidism-who was brought to the ED for weakness/progressive sleepiness-ongoing for several weeks-found to have acute metabolic encephalopathy in the setting of severe hypothyroidism with myxedema and vitamin B1 deficiency.  Significant events: 11/1>> admit to [facility]  Significant studies: 11/1>> CT head: No acute intracranial pathology 11/1>> CXR: No PNA 11/1>> TSH: 44 11/1>> FT4:<0.25 11/2>> Spot EEG: No seizures 11/2>> A1c: 10.5 11/2>> vitamin B1:<20 11/2>> vitamin B12: 1992 11/2>> NH4: 23 11/2>> RPR: Nonreactive 11/9>> renal ultrasound: Increased echogenicity of renal parenchyma bilaterally 11/10>> folate: 2.4 11/11>> right upper extremity Doppler: No DVT 11/18>> MRI brain: No acute findings 11/16>> TSH: 0.110  Significant microbiology data: 11/1>> urine culture: No growth 11/2>> blood culture: No growth  Procedures: 11/13>>Cortak tube  Consults: Neurology  Subjective: Awake-answer simple questions appropriately-follows simple commands-was sleeping when I walked in initially-woke up with a simple verbal stimuli.  Objective: Vitals: Blood pressure 139/79, pulse 91, temperature 99.2 F (37.3 C), temperature source Axillary, resp. rate 18, height 5\' 3"  (1.6 m), weight 65.6 kg, last menstrual period 05/03/2013, SpO2 95%.   Exam: Gen Exam:Alert awake-not in any distress-chronically sick appearing HEENT:atraumatic, normocephalic Chest: B/L clear to auscultation anteriorly CVS:S1S2 regular Abdomen:soft non tender, non distended Extremities:no edema Neurology: Difficult exam but moving all 4 extremities. Skin: no rash  Pertinent Labs/Radiology:    Latest Ref Rng &  Units 05/11/2023    4:28 AM 05/10/2023    5:15 AM 05/09/2023    4:28 AM  CBC  WBC 4.0 - 10.5 K/uL 7.4  8.3  9.8   Hemoglobin 12.0 - 15.0 g/dL 9.5  19.1  9.1   Hematocrit 36.0 - 46.0 % 28.8  31.5  28.1   Platelets 150 - 400 K/uL 371  324  256     Lab Results  Component Value Date   NA 149 (H) 05/11/2023   K 3.4 (L) 05/11/2023   CL 110 05/11/2023   CO2 29 05/11/2023      Assessment/Plan: Acute metabolic encephalopathy Thought to be due to severe hypothyroidism with myxedema features and Wernicke's encephalopathy. Continue to treat underlying etiologies Mental status gradually improving-but still very lethargic/sleepy-with poor oral intake per RN.  Severe hypothyroidism with myxedema Per H&P-noncompliant with levothyroxine-likely etiology for severe hypothyroidism. Continue levothyroxine-dosage adjusted 11/19 Repeat TSH in 1-2 weeks  Wernicke's encephalopathy Received several doses of high-dose IV thiamine Currently on oral thiamine supplementation  AKI on CKD stage IIIa AKI hemodynamically mediated- Renal function back to baseline with supportive care  Acute urinary retention Foley/Flomax Supportive care-mobilize-voiding trial when a little bit more awake/alert/mobilizes a bit more  Hypernatremia Due to poor oral intake Free water flushes through NG tube  Hypokalemia Replete/recheck  Failure to thrive syndrome Oropharyngeal dysphagia due to metabolic encephalopathy Remains lethargic/sleepy per RN limiting oral intake Per RN-she is much more awake today compared to yesterday-will encourage oral intake-if oral intake remains poor-will restart tube feedings-but only at night.  Normocytic anemia Secondary to acute/critical illness Required 1 unit of PRBC on 11/11-hemoglobin stable since then Follow CT C periodically  Right Upper extremity hematoma Supportive care  HTN All antihypertensives  on hold as BP stable without the use of any  medications  DM-1 Previously on insulin pump Currently CBG stable with just SSI Follow/optimize  Recent Labs    05/10/23 1609 05/10/23 2001 05/11/23 0817  GLUCAP 179* 146* 189*    Nutrition Status: Nutrition Problem: Moderate Malnutrition Etiology: chronic illness Signs/Symptoms: meal completion < 25%, moderate fat depletion, moderate muscle depletion Interventions: Ensure Enlive (each supplement provides 350kcal and 20 grams of protein), MVI, Magic cup  BMI: Estimated body mass index is 25.62 kg/m as calculated from the following:   Height as of this encounter: 5\' 3"  (1.6 m).   Weight as of this encounter: 65.6 kg.   Code status:   Code Status: Full Code   DVT Prophylaxis: enoxaparin (LOVENOX) injection 40 mg Start: 05/07/23 1245 Place and maintain sequential compression device Start: 05/03/23 1035   Family Communication: None at bedside   Disposition Plan: Status is: Inpatient Remains inpatient appropriate because: Severity of illness   Planned Discharge Destination:Skilled nursing facility   Diet: Diet Order             DIET DYS 3 Room service appropriate? Yes with Assist; Fluid consistency: Thin  Diet effective now                     Antimicrobial agents: Anti-infectives (From admission, onward)    None        MEDICATIONS: Scheduled Meds:  (feeding supplement) PROSource Plus  30 mL Oral BID BM   Chlorhexidine Gluconate Cloth  6 each Topical Q0600   docusate sodium  100 mg Oral BID   enoxaparin (LOVENOX) injection  40 mg Subcutaneous Q24H   famotidine  20 mg Per Tube Daily   feeding supplement (PROSource TF20)  60 mL Per Tube Daily   folic acid  1 mg Per Tube Daily   insulin aspart  0-9 Units Subcutaneous TID WC   lactose free nutrition  237 mL Oral TID WC   levothyroxine  75 mcg Oral Q0600   multivitamin  1 tablet Oral Daily   mouth rinse  15 mL Mouth Rinse 4 times per day   polyethylene glycol  17 g Per Tube BID   potassium  chloride  40 mEq Per Tube Once   tamsulosin  0.4 mg Oral Daily   thiamine  100 mg Per Tube Daily   Continuous Infusions: PRN Meds:.acetaminophen **OR** acetaminophen, dextrose, [DISCONTINUED] ondansetron **OR** ondansetron (ZOFRAN) IV, mouth rinse, sorbitol   I have personally reviewed following labs and imaging studies  LABORATORY DATA: CBC: Recent Labs  Lab 05/05/23 1120 05/06/23 0551 05/07/23 0333 05/08/23 0322 05/09/23 0428 05/10/23 0515 05/11/23 0428  WBC 4.6 4.8 5.1 7.8 9.8 8.3 7.4  NEUTROABS 3.6 3.5 3.4  --   --   --   --   HGB 10.3* 8.5* 8.4* 8.9* 9.1* 10.2* 9.5*  HCT 30.4* 25.7* 25.6* 27.6* 28.1* 31.5* 28.8*  MCV 87.1 86.0 86.2 87.6 88.4 87.7 88.9  PLT 271 223 217 232 256 324 371    Basic Metabolic Panel: Recent Labs  Lab 05/04/23 1708 05/05/23 1120 05/05/23 1120 05/05/23 1822 05/06/23 0551 05/07/23 0333 05/08/23 0322 05/09/23 0428 05/10/23 0515 05/11/23 0428  NA  --  140   < >  --  144 143 142 143 144 149*  K  --  3.4*   < >  --  3.1* 3.6 3.9 3.8 4.0 3.4*  CL  --  105   < >  --  104  106 104 104 105 110  CO2  --  25   < >  --  29 29 29 28 28 29   GLUCOSE  --  443*   < >  --  150* 174* 239* 208* 149* 169*  BUN  --  21   < >  --  35* 48* 60* 67* 68* 53*  CREATININE  --  0.92   < >  --  0.99 1.10* 1.28* 0.94 1.04* 0.88  CALCIUM  --  8.5*   < >  --  8.2* 8.8* 9.3 9.4 9.8 9.3  MG 2.0 2.0  --  1.9 1.9  --   --   --   --   --   PHOS 2.1* 3.2  --  4.1 3.1  --   --   --   --   --    < > = values in this interval not displayed.    GFR: Estimated Creatinine Clearance: 58.1 mL/min (by C-G formula based on SCr of 0.88 mg/dL).  Liver Function Tests: No results for input(s): "AST", "ALT", "ALKPHOS", "BILITOT", "PROT", "ALBUMIN" in the last 168 hours. No results for input(s): "LIPASE", "AMYLASE" in the last 168 hours. No results for input(s): "AMMONIA" in the last 168 hours.  Coagulation Profile: No results for input(s): "INR", "PROTIME" in the last 168  hours.  Cardiac Enzymes: No results for input(s): "CKTOTAL", "CKMB", "CKMBINDEX", "TROPONINI" in the last 168 hours.  BNP (last 3 results) No results for input(s): "PROBNP" in the last 8760 hours.  Lipid Profile: No results for input(s): "CHOL", "HDL", "LDLCALC", "TRIG", "CHOLHDL", "LDLDIRECT" in the last 72 hours.  Thyroid Function Tests: No results for input(s): "TSH", "T4TOTAL", "FREET4", "T3FREE", "THYROIDAB" in the last 72 hours.  Anemia Panel: Recent Labs    05/09/23 0428  FOLATE 8.8    Urine analysis:    Component Value Date/Time   COLORURINE YELLOW 04/22/2023 1626   APPEARANCEUR CLEAR 04/22/2023 1626   LABSPEC 1.015 04/22/2023 1626   PHURINE 6.0 04/22/2023 1626   GLUCOSEU 50 (A) 04/22/2023 1626   GLUCOSEU >=1000 (A) 12/16/2017 1110   HGBUR NEGATIVE 04/22/2023 1626   HGBUR moderate 02/01/2008 0902   BILIRUBINUR NEGATIVE 04/22/2023 1626   KETONESUR 5 (A) 04/22/2023 1626   PROTEINUR NEGATIVE 04/22/2023 1626   UROBILINOGEN 0.2 12/16/2017 1110   NITRITE NEGATIVE 04/22/2023 1626   LEUKOCYTESUR NEGATIVE 04/22/2023 1626    Sepsis Labs: Lactic Acid, Venous    Component Value Date/Time   LATICACIDVEN 2.1 (HH) 04/22/2023 2039    MICROBIOLOGY: No results found for this or any previous visit (from the past 240 hour(s)).  RADIOLOGY STUDIES/RESULTS: MR BRAIN WO CONTRAST  Result Date: 05/09/2023 CLINICAL DATA:  Altered mental status EXAM: MRI HEAD WITHOUT CONTRAST TECHNIQUE: Multiplanar, multiecho pulse sequences of the brain and surrounding structures were obtained without intravenous contrast. COMPARISON:  Head CT 04/22/2023 FINDINGS: Brain: Diffusion imaging does not show any acute or subacute infarction or other cause of restricted diffusion. No focal abnormality affects the brainstem or cerebellum. Cerebral hemispheres show generalized volume loss. There are advanced chronic small-vessel ischemic changes of the white matter. No cortical or large vessel territory  infarction. No mass lesion, hemorrhage, hydrocephalus or extra-axial collection. Vascular: Major vessels at the base of the brain show flow. Skull and upper cervical spine: Negative Sinuses/Orbits: Sinuses are clear.  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 Other: None IMPRESSION: No acute or reversible finding. Generalized volume loss. Advanced chronic small-vessel ischemic changes of the cerebral hemispheric white matter.  Electronically Signed   By: Paulina Fusi M.D.   On: 05/09/2023 16:37     LOS: 19 days   Jeoffrey Massed, MD  [facility]    To contact the attending provider between 7A-7P or the covering provider during after hours 7P-7A, please log into the web site [email or web address] and access using universal [place] password for that web site. If you do not have the password, please call the hospital operator.  05/11/2023, 9:19 AM

## 2023-05-11 NOTE — Progress Notes (Signed)
Brief Neuro Update:  Anti TPO and TGA antibodies are negative. We will signoff. Recommend follow up with neurology outpatient.  Erick Blinks [facility]

## 2023-05-11 NOTE — Progress Notes (Addendum)
Nutrition Follow-up  DOCUMENTATION CODES:   Non-severe (moderate) malnutrition in context of chronic illness  INTERVENTION:  Continue with DYS3 diet Continue prosource 30ml, BID Continue thiamin Continue MVI Continue FWF  every 4 hours ( per day) Initiate tube feeding via cortrak: Nocturnal feedings Glucerna 1.5 at 65 ml/h x 12 hrs(780 ml per day) Prosource TF20 60 ml daily  Provides 1320 kcal, 104 gm protein, 592 ml free water daily.    NUTRITION DIAGNOSIS:   Moderate Malnutrition related to chronic illness as evidenced by meal completion < 25%, moderate fat depletion, moderate muscle depletion. On going with interventions in place.    GOAL:   Patient will meet greater than or equal to 90% of their needs    MONITOR:   PO intake, Supplement acceptance, Weight trends  REASON FOR ASSESSMENT:   Consult Assessment of nutrition requirement/status  ASSESSMENT:   67 y.o. F, admitted with severe hypothyroidism. PMHX: DM-1 supposed to be on insulin pump, hypothyroidism and HTN, leukopenia.  Reported to have declined oral intake.  Pt was discussed during rounds.  Oral intake continues to be poor. Pt would benefit nocturnal feedings to assist in meeting nutrient needs.  Patient pleasant this day with no complaints.  RN reports poor oral intake.   NUTRITION - FOCUSED PHYSICAL EXAM:  Flowsheet Row Most Recent Value  Orbital Region Mild depletion  Upper Arm Region Mild depletion  Thoracic and Lumbar Region No depletion  Buccal Region Moderate depletion  Temple Region Moderate depletion  Clavicle and Acromion Bone Region Mild depletion  Scapular Bone Region Unable to assess  Dorsal Hand Unable to assess  Patellar Region Mild depletion  Anterior Thigh Region Mild depletion  Posterior Calf Region Mild depletion  Edema (RD Assessment) Mild  Hair Reviewed  Eyes Reviewed  Mouth Reviewed  Skin Reviewed  Nails Reviewed       Diet Order:   Diet Order              DIET DYS 3 Room service appropriate? Yes with Assist; Fluid consistency: Thin  Diet effective now                   EDUCATION NEEDS:   Not appropriate for education at this time  Skin:  Skin Assessment: Reviewed RN Assessment  Last BM:  05/09/23  Height:   Ht Readings from Last 1 Encounters:  04/23/23 5\' 3"  (1.6 m)    Weight:   Wt Readings from Last 1 Encounters:  05/11/23 65.6 kg    Ideal Body Weight:     BMI:  Body mass index is 25.62 kg/m.  Estimated Nutritional Needs:   Kcal:  2000-2300kcal  Protein:  95-110 g/day  Fluid:  1800-2200 ml/d    Jamelle Haring RDN, LDN Clinical Dietitian  RDN pager # available on Amion

## 2023-05-11 NOTE — TOC Progression Note (Signed)
Transition of Care [facility]) - Progression Note    Patient Details  Name: Katherine Navarro MRN: [id number] Date of Birth: 1955-09-18  [facility] CM/SW Contact  Delilah Shan, LCSWA Phone Number: 05/11/2023, 5:08 PM  Clinical Narrative:     CSW called and left message with pts spouse requesting return call. TOC will continue to follow.  Expected Discharge Plan: Skilled Nursing Facility Barriers to Discharge: Continued Medical Work up, English as a second language teacher, SNF Pending bed offer  Expected Discharge Plan and Services In-house Referral: Clinical Social Work   Post Acute Care Choice: Skilled Nursing Facility Living arrangements for the past 2 months: Single Family Home                                       Social Determinants of Health (SDOH) Interventions SDOH Screenings   Food Insecurity: No Food Insecurity (04/25/2023)  Housing: Low Risk  (04/25/2023)  Transportation Needs: No Transportation Needs (04/25/2023)  Utilities: Not At Risk (04/25/2023)  Depression (PHQ2-9): Low Risk  (10/31/2019)  Tobacco Use: Low Risk  (04/22/2023)    Readmission Risk Interventions     No data to display

## 2023-05-12 DIAGNOSIS — E039 Hypothyroidism, unspecified: Secondary | ICD-10-CM | POA: Diagnosis not present

## 2023-05-12 DIAGNOSIS — R627 Adult failure to thrive: Secondary | ICD-10-CM | POA: Diagnosis not present

## 2023-05-12 DIAGNOSIS — E512 Wernicke's encephalopathy: Secondary | ICD-10-CM | POA: Diagnosis not present

## 2023-05-12 DIAGNOSIS — E440 Moderate protein-calorie malnutrition: Secondary | ICD-10-CM | POA: Diagnosis not present

## 2023-05-12 LAB — GLUCOSE, CAPILLARY
Glucose-Capillary: 157 mg/dL — ABNORMAL HIGH (ref 70–99)
Glucose-Capillary: 168 mg/dL — ABNORMAL HIGH (ref 70–99)
Glucose-Capillary: 177 mg/dL — ABNORMAL HIGH (ref 70–99)
Glucose-Capillary: 218 mg/dL — ABNORMAL HIGH (ref 70–99)
Glucose-Capillary: 286 mg/dL — ABNORMAL HIGH (ref 70–99)
Glucose-Capillary: 288 mg/dL — ABNORMAL HIGH (ref 70–99)
Glucose-Capillary: 371 mg/dL — ABNORMAL HIGH (ref 70–99)

## 2023-05-12 LAB — BASIC METABOLIC PANEL
Anion gap: 11 (ref 5–15)
BUN: 49 mg/dL — ABNORMAL HIGH (ref 8–23)
CO2: 25 mmol/L (ref 22–32)
Calcium: 9.6 mg/dL (ref 8.9–10.3)
Chloride: 112 mmol/L — ABNORMAL HIGH (ref 98–111)
Creatinine, Ser: 1 mg/dL (ref 0.44–1.00)
GFR, Estimated: >60 mL/min (ref >60)
Glucose, Bld: 378 mg/dL — ABNORMAL HIGH (ref 70–99)
Potassium: 4 mmol/L (ref 3.5–5.1)
Sodium: 148 mmol/L — ABNORMAL HIGH (ref 135–145)

## 2023-05-12 MED ORDER — INSULIN ASPART 100 UNIT/ML IJ SOLN
0.0000 [IU] | Freq: Three times a day (TID) | INTRAMUSCULAR | Status: DC
Start: 2023-05-12 — End: 2023-05-13
  Administered 2023-05-12: 3 [IU] via SUBCUTANEOUS
  Administered 2023-05-12: 8 [IU] via SUBCUTANEOUS
  Administered 2023-05-12: 15 [IU] via SUBCUTANEOUS

## 2023-05-12 MED ORDER — INSULIN GLARGINE-YFGN 100 UNIT/ML ~~LOC~~ SOLN
8.0000 [IU] | Freq: Every day | SUBCUTANEOUS | Status: DC
  Administered 2023-05-12: 8 [IU] via SUBCUTANEOUS
  Filled 2023-05-12 (×2): qty 0.08

## 2023-05-12 NOTE — Plan of Care (Signed)
  Problem: Fluid Volume: Goal: Ability to maintain a balanced intake and output will improve Outcome: Progressing   Problem: Metabolic: Goal: Ability to maintain appropriate glucose levels will improve Outcome: Progressing   Problem: Nutritional: Goal: Maintenance of adequate nutrition will improve Outcome: Progressing   Problem: Fluid Volume: Goal: Ability to achieve a balanced intake and output will improve Outcome: Progressing   Problem: Metabolic: Goal: Ability to maintain appropriate glucose levels will improve Outcome: Progressing   Problem: Nutritional: Goal: Maintenance of adequate nutrition will improve Outcome: Progressing

## 2023-05-12 NOTE — Progress Notes (Signed)
PROGRESS NOTE        PATIENT DETAILS Name: Katherine Navarro Age: 67 y.o. Sex: female Date of Birth: 02/01/1956 Admit Date: 04/22/2023 Admitting Physician Buena Irish, MD ZOX:WRUEAV, Lelon Mast, [place]  Brief Summary: Patient is a 67 y.o.  female with history of DM-1, HTN, hypothyroidism-who was brought to the ED for weakness/progressive sleepiness-ongoing for several weeks-found to have acute metabolic encephalopathy in the setting of severe hypothyroidism with myxedema and vitamin B1 deficiency.  Significant events: 11/1>> admit to [facility]  Significant studies: 11/1>> CT head: No acute intracranial pathology 11/1>> CXR: No PNA 11/1>> TSH: 44 11/1>> FT4:<0.25 11/2>> Spot EEG: No seizures 11/2>> A1c: 10.5 11/2>> vitamin B1:<20 11/2>> vitamin B12: 1992 11/2>> NH4: 23 11/2>> RPR: Nonreactive 11/9>> renal ultrasound: Increased echogenicity of renal parenchyma bilaterally 11/10>> folate: 2.4 11/11>> right upper extremity Doppler: No DVT 11/18>> MRI brain: No acute findings 11/16>> TSH: 0.110  Significant microbiology data: 11/1>> urine culture: No growth 11/2>> blood culture: No growth  Procedures: 11/13>>Cortak tube  Consults: Neurology  Subjective: More awake-very poor oral intake yesterday.  Discussed with patient-encouraged her to eat as much as possible.  Objective: Vitals: Blood pressure (!) 151/76, pulse 99, temperature 98.7 F (37.1 C), temperature source Oral, resp. rate 16, height 5\' 3"  (1.6 m), weight 68 kg, last menstrual period 05/03/2013, SpO2 94%.   Exam: Gen Exam:Alert awake-not in any distress HEENT:atraumatic, normocephalic Chest: B/L clear to auscultation anteriorly CVS:S1S2 regular Abdomen:soft non tender, non distended Extremities:no edema Neurology: Generalized weakness-but nonfocal. Skin: no rash  Pertinent Labs/Radiology:    Latest Ref Rng & Units 05/11/2023    4:28 AM 05/10/2023    5:15 AM 05/09/2023    4:28  AM  CBC  WBC 4.0 - 10.5 K/uL 7.4  8.3  9.8   Hemoglobin 12.0 - 15.0 g/dL 9.5  40.9  9.1   Hematocrit 36.0 - 46.0 % 28.8  31.5  28.1   Platelets 150 - 400 K/uL 371  324  256     Lab Results  Component Value Date   NA 148 (H) 05/12/2023   K 4.0 05/12/2023   CL 112 (H) 05/12/2023   CO2 25 05/12/2023      Assessment/Plan: Acute metabolic encephalopathy Thought to be due to severe hypothyroidism with myxedema features and Wernicke's encephalopathy. Continue to treat underlying etiologies Mental status gradually improving-but still very lethargic/sleepy-with poor oral intake per RN.  Severe hypothyroidism with myxedema Per H&P-noncompliant with levothyroxine-likely etiology for severe hypothyroidism. Continue levothyroxine-dosage adjusted 11/19 Repeat TSH in 1-2 weeks  Wernicke's encephalopathy Received several doses of high-dose IV thiamine Currently on oral thiamine supplementation  AKI on CKD stage IIIa AKI hemodynamically mediated- Renal function back to baseline with supportive care  Acute urinary retention Foley/Flomax Supportive care-mobilize-voiding trial when a little bit more awake/alert/mobilizes a bit more  Hypernatremia Due to poor oral intake Improving with free water flushes Encourage oral intake. Recheck electrolytes tomorrow.  Hypokalemia Repleted  Failure to thrive syndrome Oropharyngeal dysphagia due to metabolic encephalopathy Remains lethargic/sleepy per RN limiting oral intake Continue to encourage oral intake-start calorie count today-continue nocturnal feedings If no improvement-may require PEG tube placement.  Per neurology note-some instances-encephalopathy secondary to myxedema/thiamine deficiency can take some time to clear.  Discussed with spouse-[name]-11/21-above issues explained-he understands delicate situation and would be open to placing a PEG tube if she continues to exhibit poor  oral intake.  We will commence a calorie count-spouse  will continue to think things over-we will touch base in the next 1-2 days and decide what to do next.  Normocytic anemia Secondary to acute/critical illness Required 1 unit of PRBC on 11/11-hemoglobin stable since then Follow CT C periodically  Right Upper extremity hematoma Supportive care  HTN All antihypertensives on hold as BP stable without the use of any medications  DM-1 Previously on insulin pump Currently CBG on the higher Change SSI to resistant scale Add low-dose Semglee Follow/optimize  Recent Labs    05/11/23 2055 05/12/23 0023 05/12/23 0843  GLUCAP 233* 286* 371*    Nutrition Status: Nutrition Problem: Moderate Malnutrition Etiology: chronic illness Signs/Symptoms: meal completion < 25%, moderate fat depletion, moderate muscle depletion Interventions: Ensure Enlive (each supplement provides 350kcal and 20 grams of protein), MVI, Magic cup  BMI: Estimated body mass index is 26.56 kg/m as calculated from the following:   Height as of this encounter: 5\' 3"  (1.6 m).   Weight as of this encounter: 68 kg.   Code status:   Code Status: Full Code   DVT Prophylaxis: enoxaparin (LOVENOX) injection 40 mg Start: 05/07/23 1245 Place and maintain sequential compression device Start: 05/03/23 1035   Family Communication: Spouse-[name]-updated 11/21   Disposition Plan: Status is: Inpatient Remains inpatient appropriate because: Severity of illness   Planned Discharge Destination:Skilled nursing facility   Diet: Diet Order             DIET DYS 3 Room service appropriate? Yes with Assist; Fluid consistency: Thin  Diet effective now                     Antimicrobial agents: Anti-infectives (From admission, onward)    None        MEDICATIONS: Scheduled Meds:  (feeding supplement) PROSource Plus  30 mL Oral BID BM   Chlorhexidine Gluconate Cloth  6 each Topical Q0600   docusate sodium  100 mg Oral BID   enoxaparin (LOVENOX)  injection  40 mg Subcutaneous Q24H   famotidine  20 mg Per Tube Daily   feeding supplement (PROSource TF20)  60 mL Per Tube Daily   folic acid  2 mg Per Tube Daily   free water  300 mL Per Tube Q6H   insulin aspart  0-15 Units Subcutaneous TID WC   insulin glargine-yfgn  8 Units Subcutaneous Daily   lactose free nutrition  237 mL Oral TID WC   levothyroxine  75 mcg Oral Q0600   multivitamin  1 tablet Oral Daily   mouth rinse  15 mL Mouth Rinse 4 times per day   polyethylene glycol  17 g Per Tube BID   tamsulosin  0.4 mg Oral Daily   thiamine  100 mg Per Tube Daily   Continuous Infusions:  feeding supplement (GLUCERNA 1.5 CAL) Stopped (05/12/23 0602)   PRN Meds:.acetaminophen **OR** acetaminophen, dextrose, [DISCONTINUED] ondansetron **OR** ondansetron (ZOFRAN) IV, mouth rinse, sorbitol   I have personally reviewed following labs and imaging studies  LABORATORY DATA: CBC: Recent Labs  Lab 05/06/23 0551 05/07/23 0333 05/08/23 0322 05/09/23 0428 05/10/23 0515 05/11/23 0428  WBC 4.8 5.1 7.8 9.8 8.3 7.4  NEUTROABS 3.5 3.4  --   --   --   --   HGB 8.5* 8.4* 8.9* 9.1* 10.2* 9.5*  HCT 25.7* 25.6* 27.6* 28.1* 31.5* 28.8*  MCV 86.0 86.2 87.6 88.4 87.7 88.9  PLT 223 217 232 256 324 371  Basic Metabolic Panel: Recent Labs  Lab 05/05/23 1822 05/06/23 0551 05/07/23 0333 05/08/23 0322 05/09/23 0428 05/10/23 0515 05/11/23 0428 05/12/23 0425  NA  --  144   < > 142 143 144 149* 148*  K  --  3.1*   < > 3.9 3.8 4.0 3.4* 4.0  CL  --  104   < > 104 104 105 110 112*  CO2  --  29   < > 29 28 28 29 25   GLUCOSE  --  150*   < > 239* 208* 149* 169* 378*  BUN  --  35*   < > 60* 67* 68* 53* 49*  CREATININE  --  0.99   < > 1.28* 0.94 1.04* 0.88 1.00  CALCIUM  --  8.2*   < > 9.3 9.4 9.8 9.3 9.6  MG 1.9 1.9  --   --   --   --   --   --   PHOS 4.1 3.1  --   --   --   --   --   --    < > = values in this interval not displayed.    GFR: Estimated Creatinine Clearance: 51.9 mL/min (by  C-G formula based on SCr of 1 mg/dL).  Liver Function Tests: No results for input(s): "AST", "ALT", "ALKPHOS", "BILITOT", "PROT", "ALBUMIN" in the last 168 hours. No results for input(s): "LIPASE", "AMYLASE" in the last 168 hours. No results for input(s): "AMMONIA" in the last 168 hours.  Coagulation Profile: No results for input(s): "INR", "PROTIME" in the last 168 hours.  Cardiac Enzymes: No results for input(s): "CKTOTAL", "CKMB", "CKMBINDEX", "TROPONINI" in the last 168 hours.  BNP (last 3 results) No results for input(s): "PROBNP" in the last 8760 hours.  Lipid Profile: No results for input(s): "CHOL", "HDL", "LDLCALC", "TRIG", "CHOLHDL", "LDLDIRECT" in the last 72 hours.  Thyroid Function Tests: No results for input(s): "TSH", "T4TOTAL", "FREET4", "T3FREE", "THYROIDAB" in the last 72 hours.  Anemia Panel: No results for input(s): "VITAMINB12", "FOLATE", "FERRITIN", "TIBC", "IRON", "RETICCTPCT" in the last 72 hours.   Urine analysis:    Component Value Date/Time   COLORURINE YELLOW 04/22/2023 1626   APPEARANCEUR CLEAR 04/22/2023 1626   LABSPEC 1.015 04/22/2023 1626   PHURINE 6.0 04/22/2023 1626   GLUCOSEU 50 (A) 04/22/2023 1626   GLUCOSEU >=1000 (A) 12/16/2017 1110   HGBUR NEGATIVE 04/22/2023 1626   HGBUR moderate 02/01/2008 0902   BILIRUBINUR NEGATIVE 04/22/2023 1626   KETONESUR 5 (A) 04/22/2023 1626   PROTEINUR NEGATIVE 04/22/2023 1626   UROBILINOGEN 0.2 12/16/2017 1110   NITRITE NEGATIVE 04/22/2023 1626   LEUKOCYTESUR NEGATIVE 04/22/2023 1626    Sepsis Labs: Lactic Acid, Venous    Component Value Date/Time   LATICACIDVEN 2.1 (HH) 04/22/2023 2039    MICROBIOLOGY: No results found for this or any previous visit (from the past 240 hour(s)).  RADIOLOGY STUDIES/RESULTS: No results found.   LOS: 20 days   Jeoffrey Massed, MD  [facility]    To contact the attending provider between 7A-7P or the covering provider during after hours 7P-7A,  please log into the web site [email or web address] and access using universal [place] password for that web site. If you do not have the password, please call the hospital operator.  05/12/2023, 12:07 PM

## 2023-05-12 NOTE — Progress Notes (Signed)
Physical Therapy Treatment Patient Details Name: Katherine Navarro MRN: [id number] DOB: 07-03-1955 Today's Date: 05/12/2023   History of Present Illness Pt is a 67 y/o F presenting to ED on 11/1 with progressive sleepiness, CT head negative, Admitted for metabolic encephalopathy in setting of hypothyroidism with elevated TSH. PMH includes DM1, hypothyroidism, HTN, diabetic retinopathy    PT Comments  Pt received in chair and noted to be slid down requiring assist for repositioning. Pt noted to have maxisky lift pad underneath instead of maximove, so pt was transferred back to bed for safety until appropriate pad could be ordered, RN notified. Pt able to initiate anterior lean with cues this session, however demonstrates little initiation otherwise. Pt requires max A +2 to stand pivot to EOB. Pt noted to be incontinent of bowel upon return to supine and required assist with pericare. When cued to lift "right leg" pt elevated LLE and is unable to then lift RLE with cues. Pt continues to benefit from PT services to progress toward functional mobility goals.     If plan is discharge home, recommend the following: Assistance with cooking/housework;Assistance with feeding;Help with stairs or ramp for entrance;Direct supervision/assist for medications management;Direct supervision/assist for financial management;Assist for transportation;Supervision due to cognitive status;Two people to help with walking and/or transfers   Can travel by private vehicle     No  Equipment Recommendations  None recommended by PT    Recommendations for Other Services       Precautions / Restrictions Precautions Precautions: Fall Precaution Comments: cortrak Restrictions Weight Bearing Restrictions: No     Mobility  Bed Mobility Overal bed mobility: Needs Assistance Bed Mobility: Sit to Supine, Rolling Rolling: Max assist   Supine to sit: Max assist, +2 for physical assistance     General bed mobility  comments: helicopter method to return to supine. Pt not following cues to assist with rolling, however is able to hold rail when hand is placed on it    Transfers Overall transfer level: Needs assistance Equipment used: 2 person hand held assist Transfers: Bed to chair/wheelchair/BSC   Stand pivot transfers: Max assist, +2 physical assistance         General transfer comment: max A +2 to stand pivot from recliner to EOB with pt demonstrating little initiation despite cues        Balance Overall balance assessment: Needs assistance Sitting-balance support: No upper extremity supported, Feet supported Sitting balance-Leahy Scale: Poor Sitting balance - Comments: sitting in recliner and EOB with L lateral lean requiring assist to correct   Standing balance support: Bilateral upper extremity supported, During functional activity Standing balance-Leahy Scale: Zero Standing balance comment: Pt requires +2 assist to maintain standing                            Cognition Arousal: Alert Behavior During Therapy: Flat affect Overall Cognitive Status: No family/caregiver present to determine baseline cognitive functioning                                 General Comments: Pt keeps eyes closed throughout session, but responds to questions        Exercises      General Comments        Pertinent Vitals/Pain Pain Assessment Pain Assessment: Faces Faces Pain Scale: Hurts a little bit Pain Location: generalized with mobility Pain Descriptors / Indicators: Moaning, Grimacing Pain  Intervention(s): Monitored during session, Repositioned, Limited activity within patient's tolerance     PT Goals (current goals can now be found in the care plan section) Acute Rehab PT Goals Patient Stated Goal: none stated PT Goal Formulation: With patient/family Time For Goal Achievement: 05/07/23 Progress towards PT goals: Not progressing toward goals - comment (limited  by impaired cognition)    Frequency    Min 1X/week       AM-PAC PT "6 Clicks" Mobility   Outcome Measure  Help needed turning from your back to your side while in a flat bed without using bedrails?: A Lot Help needed moving from lying on your back to sitting on the side of a flat bed without using bedrails?: A Lot Help needed moving to and from a bed to a chair (including a wheelchair)?: Total Help needed standing up from a chair using your arms (e.g., wheelchair or bedside chair)?: Total Help needed to walk in hospital room?: Total Help needed climbing 3-5 steps with a railing? : Total 6 Click Score: 8    End of Session Equipment Utilized During Treatment: Gait belt Activity Tolerance: Patient limited by fatigue;Other (comment) (impaired cognition) Patient left: in bed;with call bell/phone within reach;with bed alarm set Nurse Communication: Mobility status PT Visit Diagnosis: Other abnormalities of gait and mobility (R26.89);Muscle weakness (generalized) (M62.81);Difficulty in walking, not elsewhere classified (R26.2)     Time: 0934-1000 PT Time Calculation (min) (ACUTE ONLY): 26 min  Charges:    $Therapeutic Activity: 23-37 mins PT General Charges $$ ACUTE PT VISIT: 1 Visit                     Johny Shock, PTA Acute Rehabilitation Services Secure Chat Preferred  Office:([phone]    Johny Shock 05/12/2023, 10:56 AM

## 2023-05-12 NOTE — Progress Notes (Signed)
Brief Calorie count initiation note.  Patient continues to have poor oral intake.   Current interventions; Continue with DYS3 diet Continue prosource 30ml, BID Continue thiamin Continue MVI Continue FWF  every 4 hours ( per day) Initiate tube feeding via cortrak: Nocturnal feedings Glucerna 1.5 at 65 ml/h x 12 hrs(780 ml per day) Prosource TF20 60 ml daily   Provides 1320 kcal, 104 gm protein, 592 ml free water daily. Calorie count initiated today.  Please hang calorie count envelope on the patient's door. Document percent consumed for each item on the patient's meal tray ticket and keep in envelope. Also document percent of any supplement or snack pt consumes and keep documentation in envelope for RD to review.     RD will follow-up Katherine Navarro RDN, LDN Clinical Dietitian  RDN pager # available on Amion

## 2023-05-13 DIAGNOSIS — R627 Adult failure to thrive: Secondary | ICD-10-CM | POA: Diagnosis not present

## 2023-05-13 DIAGNOSIS — E512 Wernicke's encephalopathy: Secondary | ICD-10-CM | POA: Diagnosis not present

## 2023-05-13 DIAGNOSIS — E440 Moderate protein-calorie malnutrition: Secondary | ICD-10-CM | POA: Diagnosis not present

## 2023-05-13 DIAGNOSIS — E039 Hypothyroidism, unspecified: Secondary | ICD-10-CM | POA: Diagnosis not present

## 2023-05-13 LAB — BASIC METABOLIC PANEL WITH GFR
Anion gap: 10 (ref 5–15)
BUN: 47 mg/dL — ABNORMAL HIGH (ref 8–23)
CO2: 24 mmol/L (ref 22–32)
Calcium: 9.6 mg/dL (ref 8.9–10.3)
Chloride: 114 mmol/L — ABNORMAL HIGH (ref 98–111)
Creatinine, Ser: 1.06 mg/dL — ABNORMAL HIGH (ref 0.44–1.00)
GFR, Estimated: 58 mL/min — ABNORMAL LOW
Glucose, Bld: 402 mg/dL — ABNORMAL HIGH (ref 70–99)
Potassium: 3.9 mmol/L (ref 3.5–5.1)
Sodium: 148 mmol/L — ABNORMAL HIGH (ref 135–145)

## 2023-05-13 LAB — GLUCOSE, CAPILLARY
Glucose-Capillary: 115 mg/dL — ABNORMAL HIGH (ref 70–99)
Glucose-Capillary: 137 mg/dL — ABNORMAL HIGH (ref 70–99)
Glucose-Capillary: 235 mg/dL — ABNORMAL HIGH (ref 70–99)
Glucose-Capillary: 312 mg/dL — ABNORMAL HIGH (ref 70–99)
Glucose-Capillary: 321 mg/dL — ABNORMAL HIGH (ref 70–99)
Glucose-Capillary: 345 mg/dL — ABNORMAL HIGH (ref 70–99)

## 2023-05-13 MED ORDER — INSULIN GLARGINE-YFGN 100 UNIT/ML ~~LOC~~ SOLN
14.0000 [IU] | Freq: Every day | SUBCUTANEOUS | Status: DC
  Administered 2023-05-13: 14 [IU] via SUBCUTANEOUS
  Filled 2023-05-13 (×2): qty 0.14

## 2023-05-13 MED ORDER — INSULIN GLARGINE-YFGN 100 UNIT/ML ~~LOC~~ SOLN: 12.0000 [IU] | Freq: Every day | SUBCUTANEOUS | Status: DC

## 2023-05-13 MED ORDER — INSULIN ASPART 100 UNIT/ML IJ SOLN
0.0000 [IU] | INTRAMUSCULAR | Status: DC
  Administered 2023-05-13: 2 [IU] via SUBCUTANEOUS
  Administered 2023-05-13 (×2): 11 [IU] via SUBCUTANEOUS
  Administered 2023-05-13: 5 [IU] via SUBCUTANEOUS
  Administered 2023-05-13: 11 [IU] via SUBCUTANEOUS
  Administered 2023-05-14 (×2): 5 [IU] via SUBCUTANEOUS
  Administered 2023-05-14: 8 [IU] via SUBCUTANEOUS
  Administered 2023-05-14: 3 [IU] via SUBCUTANEOUS
  Administered 2023-05-14: 8 [IU] via SUBCUTANEOUS
  Administered 2023-05-15: 11 [IU] via SUBCUTANEOUS
  Administered 2023-05-15 (×3): 5 [IU] via SUBCUTANEOUS
  Administered 2023-05-15: 11 [IU] via SUBCUTANEOUS
  Administered 2023-05-15 – 2023-05-16 (×3): 3 [IU] via SUBCUTANEOUS
  Administered 2023-05-16: 2 [IU] via SUBCUTANEOUS
  Administered 2023-05-16: 3 [IU] via SUBCUTANEOUS
  Administered 2023-05-16 (×2): 5 [IU] via SUBCUTANEOUS
  Administered 2023-05-17 (×2): 3 [IU] via SUBCUTANEOUS
  Administered 2023-05-17: 2 [IU] via SUBCUTANEOUS
  Administered 2023-05-17: 3 [IU] via SUBCUTANEOUS
  Administered 2023-05-17 (×2): 5 [IU] via SUBCUTANEOUS
  Administered 2023-05-18: 3 [IU] via SUBCUTANEOUS
  Administered 2023-05-18: 2 [IU] via SUBCUTANEOUS
  Administered 2023-05-18: 5 [IU] via SUBCUTANEOUS
  Administered 2023-05-18: 3 [IU] via SUBCUTANEOUS
  Administered 2023-05-19: 5 [IU] via SUBCUTANEOUS
  Administered 2023-05-19: 2 [IU] via SUBCUTANEOUS
  Administered 2023-05-19: 5 [IU] via SUBCUTANEOUS
  Administered 2023-05-19 (×2): 8 [IU] via SUBCUTANEOUS
  Administered 2023-05-20: 3 [IU] via SUBCUTANEOUS
  Administered 2023-05-20: 8 [IU] via SUBCUTANEOUS
  Administered 2023-05-20: 3 [IU] via SUBCUTANEOUS

## 2023-05-13 MED ORDER — FREE WATER
300.0000 mL | Status: DC
  Administered 2023-05-13 – 2023-05-14 (×6): 300 mL

## 2023-05-13 NOTE — Progress Notes (Signed)
Calorie Count Note  24 hour calorie count ordered.  Diet: DYS 3 Supplements: prosource oral 30ml BID, Boost plus TID, provides 360 kcal and 15 g protein/carton(242ml)  Breakfast: 11/22  0% Lunch: 11-21 0% Dinner: 11-21 5% Supplements: 0% Receiving nocturnal feeding of Glucerna 1.5 @ 65 ml/hr  Total intake: 31.4 kcal (2% of minimum estimated needs)  2g protein (2% of minimum estimated needs)  Nutrition Dx: Moderate Malnutrition related to chronic illness as evidenced by meal completion < 25%, moderate fat depletion, moderate muscle depletion. On going with interventions in place.   Goal:   Patient will meet greater than or equal to 90% of their needs   Intervention:  Continue calorie count x 48 hrs Continue with DYS3 diet Continue prosource 30ml, BID Continue thiamin Continue MVI Continue FWF  every 4 hours ( per day) Initiate tube feeding via cortrak: Nocturnal feedings Glucerna 1.5 at 65 ml/h x 12 hrs(780 ml per day) Prosource TF20 60 ml daily   Provides 1320 kcal, 104 gm protein, 592 ml free water daily.  Should oral intake be poor consider PEG placement.  Jamelle Haring RDN, LDN Clinical Dietitian  RDN pager # available on Amion

## 2023-05-13 NOTE — Progress Notes (Signed)
PROGRESS NOTE        PATIENT DETAILS Name: Katherine Navarro Age: 67 y.o. Sex: female Date of Birth: 1956/03/17 Admit Date: 04/22/2023 Admitting Physician Buena Irish, MD [facility], Lelon Mast, [place]  Brief Summary: Patient is a 67 y.o.  female with history of DM-1, HTN, hypothyroidism-who was brought to the ED for weakness/progressive sleepiness-ongoing for several weeks-found to have acute metabolic encephalopathy in the setting of severe hypothyroidism with myxedema and vitamin B1 deficiency.  Significant events: 11/1>> admit to [facility]  Significant studies: 11/1>> CT head: No acute intracranial pathology 11/1>> CXR: No PNA 11/1>> TSH: 44 11/1>> FT4:<0.25 11/2>> Spot EEG: No seizures 11/2>> A1c: 10.5 11/2>> vitamin B1:<20 11/2>> vitamin B12: 1992 11/2>> NH4: 23 11/2>> RPR: Nonreactive 11/9>> renal ultrasound: Increased echogenicity of renal parenchyma bilaterally 11/10>> folate: 2.4 11/11>> right upper extremity Doppler: No DVT 11/18>> MRI brain: No acute findings 11/16>> TSH: 0.110  Significant microbiology data: 11/1>> urine culture: No growth 11/2>> blood culture: No growth  Procedures: 11/13>>Cortak tube  Consults: Neurology  Subjective: Sleeping-awakes with a loud verbal stimuli-able to talk in short sentences-appears lethargic still.  Objective: Vitals: Blood pressure (!) 159/83, pulse 92, temperature 98.3 F (36.8 C), temperature source Oral, resp. rate 13, height 5\' 3"  (1.6 m), weight 67.1 kg, last menstrual period 05/03/2013, SpO2 97%.   Exam: Gen Exam:not in any distress HEENT:atraumatic, normocephalic Chest: B/L clear to auscultation anteriorly CVS:S1S2 regular Abdomen:soft non tender, non distended Extremities:no edema Neurology: Non focal Skin: no rash  Pertinent Labs/Radiology:    Latest Ref Rng & Units 05/11/2023    4:28 AM 05/10/2023    5:15 AM 05/09/2023    4:28 AM  CBC  WBC 4.0 - 10.5 K/uL 7.4  8.3  9.8    Hemoglobin 12.0 - 15.0 g/dL 9.5  52.8  9.1   Hematocrit 36.0 - 46.0 % 28.8  31.5  28.1   Platelets 150 - 400 K/uL 371  324  256     Lab Results  Component Value Date   NA 148 (H) 05/13/2023   K 3.9 05/13/2023   CL 114 (H) 05/13/2023   CO2 24 05/13/2023      Assessment/Plan: Acute metabolic encephalopathy Thought to be due to severe hypothyroidism with myxedema features and Wernicke's encephalopathy. Continue to treat underlying etiologies Mental status gradually improving-but still very lethargic/sleepy-with poor oral intake per RN.  Severe hypothyroidism with myxedema Per H&P-noncompliant with levothyroxine-likely etiology for severe hypothyroidism. Continue levothyroxine-dosage adjusted 11/19 Repeat TSH in 1-2 weeks  Wernicke's encephalopathy Received several doses of high-dose IV thiamine Currently on oral thiamine supplementation  AKI on CKD stage IIIa AKI hemodynamically mediated- Renal function back to baseline with supportive care  Acute urinary retention Foley/Flomax Supportive care-mobilize-voiding trial when a little bit more awake/alert/mobilizes a bit more  Hypernatremia Due to poor oral intake Improving with free water flushes Encourage oral intake. Recheck electrolytes tomorrow.  Hypokalemia Repleted  Failure to thrive syndrome Oropharyngeal dysphagia due to metabolic encephalopathy Remains lethargic/sleepy per RN limiting oral intake Calorie count in progress-if determined to have poor oral intake-will require PEG tube placement-ongoing discussions with family.  In interim-continue nocturnal tube feeds.  Normocytic anemia Secondary to acute/critical illness Required 1 unit of PRBC on 11/11-hemoglobin stable since then Follow CBC periodically  Right Upper extremity hematoma Supportive care  HTN All antihypertensives on hold as BP stable without the use  of any medications  DM-1 Previously on insulin pump CBG is on the higher side Semglee  increased to 14 units SSI adjusted Follow/optimize.  Recent Labs    05/12/23 2344 05/13/23 0347 05/13/23 0820  GLUCAP 218* 345* 321*    Nutrition Status: Nutrition Problem: Moderate Malnutrition Etiology: chronic illness Signs/Symptoms: meal completion < 25%, moderate fat depletion, moderate muscle depletion Interventions: Ensure Enlive (each supplement provides 350kcal and 20 grams of protein), MVI, Magic cup  BMI: Estimated body mass index is 26.2 kg/m as calculated from the following:   Height as of this encounter: 5\' 3"  (1.6 m).   Weight as of this encounter: 67.1 kg.   Code status:   Code Status: Full Code   DVT Prophylaxis: enoxaparin (LOVENOX) injection 40 mg Start: 05/07/23 1245 Place and maintain sequential compression device Start: 05/03/23 1035   Family Communication: Spouse-[name]-[phone]-updated 11/21   Disposition Plan: Status is: Inpatient Remains inpatient appropriate because: Severity of illness   Planned Discharge Destination:Skilled nursing facility   Diet: Diet Order             DIET DYS 3 Room service appropriate? Yes with Assist; Fluid consistency: Thin  Diet effective now                     Antimicrobial agents: Anti-infectives (From admission, onward)    None        MEDICATIONS: Scheduled Meds:  (feeding supplement) PROSource Plus  30 mL Oral BID BM   Chlorhexidine Gluconate Cloth  6 each Topical Q0600   docusate sodium  100 mg Oral BID   enoxaparin (LOVENOX) injection  40 mg Subcutaneous Q24H   famotidine  20 mg Per Tube Daily   feeding supplement (PROSource TF20)  60 mL Per Tube Daily   folic acid  2 mg Per Tube Daily   free water  300 mL Per Tube Q4H   insulin aspart  0-15 Units Subcutaneous Q4H   insulin glargine-yfgn  14 Units Subcutaneous Daily   lactose free nutrition  237 mL Oral TID WC   levothyroxine  75 mcg Oral Q0600   multivitamin  1 tablet Oral Daily   mouth rinse  15 mL Mouth Rinse 4 times per  day   polyethylene glycol  17 g Per Tube BID   tamsulosin  0.4 mg Oral Daily   thiamine  100 mg Per Tube Daily   Continuous Infusions:  feeding supplement (GLUCERNA 1.5 CAL) 1,000 mL (05/12/23 2151)   PRN Meds:.acetaminophen **OR** acetaminophen, dextrose, [DISCONTINUED] ondansetron **OR** ondansetron (ZOFRAN) IV, mouth rinse, sorbitol   I have personally reviewed following labs and imaging studies  LABORATORY DATA: CBC: Recent Labs  Lab 05/07/23 0333 05/08/23 0322 05/09/23 0428 05/10/23 0515 05/11/23 0428  WBC 5.1 7.8 9.8 8.3 7.4  NEUTROABS 3.4  --   --   --   --   HGB 8.4* 8.9* 9.1* 10.2* 9.5*  HCT 25.6* 27.6* 28.1* 31.5* 28.8*  MCV 86.2 87.6 88.4 87.7 88.9  PLT 217 232 256 324 371    Basic Metabolic Panel: Recent Labs  Lab 05/09/23 0428 05/10/23 0515 05/11/23 0428 05/12/23 0425 05/13/23 0352  NA 143 144 149* 148* 148*  K 3.8 4.0 3.4* 4.0 3.9  CL 104 105 110 112* 114*  CO2 28 28 29 25 24   GLUCOSE 208* 149* 169* 378* 402*  BUN 67* 68* 53* 49* 47*  CREATININE 0.94 1.04* 0.88 1.00 1.06*  CALCIUM 9.4 9.8 9.3 9.6 9.6    GFR:  Estimated Creatinine Clearance: 48.7 mL/min (A) (by C-G formula based on SCr of 1.06 mg/dL (H)).  Liver Function Tests: No results for input(s): "AST", "ALT", "ALKPHOS", "BILITOT", "PROT", "ALBUMIN" in the last 168 hours. No results for input(s): "LIPASE", "AMYLASE" in the last 168 hours. No results for input(s): "AMMONIA" in the last 168 hours.  Coagulation Profile: No results for input(s): "INR", "PROTIME" in the last 168 hours.  Cardiac Enzymes: No results for input(s): "CKTOTAL", "CKMB", "CKMBINDEX", "TROPONINI" in the last 168 hours.  BNP (last 3 results) No results for input(s): "PROBNP" in the last 8760 hours.  Lipid Profile: No results for input(s): "CHOL", "HDL", "LDLCALC", "TRIG", "CHOLHDL", "LDLDIRECT" in the last 72 hours.  Thyroid Function Tests: No results for input(s): "TSH", "T4TOTAL", "FREET4", "T3FREE",  "THYROIDAB" in the last 72 hours.  Anemia Panel: No results for input(s): "VITAMINB12", "FOLATE", "FERRITIN", "TIBC", "IRON", "RETICCTPCT" in the last 72 hours.   Urine analysis:    Component Value Date/Time   COLORURINE YELLOW 04/22/2023 1626   APPEARANCEUR CLEAR 04/22/2023 1626   LABSPEC 1.015 04/22/2023 1626   PHURINE 6.0 04/22/2023 1626   GLUCOSEU 50 (A) 04/22/2023 1626   GLUCOSEU >=1000 (A) 12/16/2017 1110   HGBUR NEGATIVE 04/22/2023 1626   HGBUR moderate 02/01/2008 0902   BILIRUBINUR NEGATIVE 04/22/2023 1626   KETONESUR 5 (A) 04/22/2023 1626   PROTEINUR NEGATIVE 04/22/2023 1626   UROBILINOGEN 0.2 12/16/2017 1110   NITRITE NEGATIVE 04/22/2023 1626   LEUKOCYTESUR NEGATIVE 04/22/2023 1626    Sepsis Labs: Lactic Acid, Venous    Component Value Date/Time   LATICACIDVEN 2.1 (HH) 04/22/2023 2039    MICROBIOLOGY: No results found for this or any previous visit (from the past 240 hour(s)).  RADIOLOGY STUDIES/RESULTS: No results found.   LOS: 21 days   Jeoffrey Massed, MD  [facility]    To contact the attending provider between 7A-7P or the covering provider during after hours 7P-7A, please log into the web site [email or web address] and access using universal [place] password for that web site. If you do not have the password, please call the hospital operator.  05/13/2023, 9:54 AM

## 2023-05-13 NOTE — Inpatient Diabetes Management (Signed)
Inpatient Diabetes Program Recommendations  AACE/ADA: New Consensus Statement on Inpatient Glycemic Control (2015)  Target Ranges:  Prepandial:   less than 140 mg/dL      Peak postprandial:   less than 180 mg/dL (1-2 hours)      Critically ill patients:  140 - 180 mg/dL   Lab Results  Component Value Date   GLUCAP 321 (H) 05/13/2023   HGBA1C 9.9 (H) 04/24/2023    Review of Glycemic Control  Latest Reference Range & Units 05/12/23 08:43 05/12/23 12:37 05/12/23 16:40 05/12/23 20:02 05/12/23 21:48 05/12/23 23:44 05/13/23 03:47 05/13/23 08:20  Glucose-Capillary 70 - 99 mg/dL 161 (H) 096 (H) 045 (H) 157 (H) 177 (H) 218 (H) 345 (H) 321 (H)   Diabetes history: DM 2 Outpatient Diabetes medications: insulin pump with Humalog on the home med rec Current orders for Inpatient glycemic control:  Semglee 14 units Daily (increased from 8 units on 11/22) Novolog 0-15 units Q4  Glucerna Tube Feeds 65 ml/hour  Inpatient Diabetes Program Recommendations:    Note: increase in basal insulin today, if trends continue to be elevated after the increase please consider more of Novolog Tube Feed Coverage Q4 hours, if tube feeds are stopped may have hypoglycemia with the increase in basal rather than the Tube Feed Coverage with parameters.   Thanks,  Christena Deem RN, MSN, BC-ADM Inpatient Diabetes Coordinator Team Pager [phone] (8a-5p)

## 2023-05-13 NOTE — Progress Notes (Signed)
Occupational Therapy Treatment Patient Details Name: Katherine Navarro MRN: [id number] DOB: [birth year] Today's Date: 05/13/2023   History of present illness Pt is a 67 y/o F presenting to ED on 11/1 with progressive sleepiness, CT head negative, Admitted for metabolic encephalopathy in setting of hypothyroidism with elevated TSH. PMH includes DM1, hypothyroidism, HTN, diabetic retinopathy   OT comments  In to see Ms. Urbaniak today and she was in bed with her eyes open when I entered and spontaneously reaching with RUE to rub at elbow of LUE (all GM movements). She did follow some commands with increased time and said some words to me with questions asked to her. She will continue to benefit from acute OT with follow up at from continued inpatient follow up therapy, <3 hours/day       If plan is discharge home, recommend the following:  Two people to help with walking and/or transfers;Assistance with cooking/housework;Assistance with feeding;Help with stairs or ramp for entrance;Assist for transportation;Direct supervision/assist for medications management;Two people to help with bathing/dressing/bathroom;Direct supervision/assist for financial management   Equipment Recommendations  Other (comment) (TBD next venue)       Precautions / Restrictions Precautions Precautions: Fall Precaution Comments: cortrak Restrictions Weight Bearing Restrictions: No       Mobility Bed Mobility  Total A to roll left and right in bed                  Transfers Overall transfer level: Needs assistance Equipment used: Ambulation equipment used               General transfer comment: total A +2 (RN and OT) OOB with maxi move Transfer via Lift Equipment: Maximove   Balance Overall balance assessment: Needs assistance Sitting-balance support: No upper extremity supported, Feet supported Sitting balance-Leahy Scale: Zero Sitting balance - Comments: tendency for left lateral lean in  recliner                                   ADL either performed or assessed with clinical judgement   ADL Overall ADL's : Needs assistance/impaired Eating/Feeding: Total assistance Eating/Feeding Details (indicate cue type and reason): Pt on tube feeds and food by mouth Grooming: Total assistance;Wash/dry face                                      Extremity/Trunk Assessment Upper Extremity Assessment Upper Extremity Assessment: Generalized weakness RUE Deficits / Details: moves both arms spontaneously equally today--both hands are still tight, arthritic, edematous            Vision Baseline Vision/History: 2 Legally blind Additional Comments: left eye blind per chart          Cognition Arousal:  (eyes open intermittently) Behavior During Therapy: Flat affect Overall Cognitive Status: No family/caregiver present to determine baseline cognitive functioning                                 General Comments: eyes open intermittently;followed commands of squeeze hands and thumbs up with increased time; told me her last name, said emphatic "yes" when I asked her if she was thirsty and drank 1/2 cranberry juice and also said "not juice" when I asked her if she wanted more to drink. Also raised her left arm  when I said I needed to remove her BP cuff.                   Pertinent Vitals/ Pain       Pain Assessment Pain Assessment: Faces Faces Pain Scale: No hurt         Frequency  Min 1X/week        Progress Toward Goals  OT Goals(current goals can now be found in the care plan section)  Progress towards OT goals: Progressing toward goals  Acute Rehab OT Goals Patient Stated Goal: unable OT Goal Formulation: Patient unable to participate in goal setting Time For Goal Achievement: 05/19/23 Potential to Achieve Goals: Fair  Plan         AM-PAC OT "6 Clicks" Daily Activity     Outcome Measure   Help from another  person eating meals?: Total Help from another person taking care of personal grooming?: Total Help from another person toileting, which includes using toliet, bedpan, or urinal?: Total Help from another person bathing (including washing, rinsing, drying)?: Total Help from another person to put on and taking off regular upper body clothing?: Total Help from another person to put on and taking off regular lower body clothing?: Total 6 Click Score: 6    End of Session    OT Visit Diagnosis: Other abnormalities of gait and mobility (R26.89);Muscle weakness (generalized) (M62.81);Low vision, both eyes (H54.2);Cognitive communication deficit (R41.841);Other symptoms and signs involving cognitive function   Activity Tolerance Patient limited by lethargy (but better than last OT session)   Patient Left in chair;with call bell/phone within reach;with chair alarm set;with nursing/sitter in room   Nurse Communication Need for lift equipment (RN assisted me with pt OOB with Maxi move)        Time: 7425-9563 OT Time Calculation (min): 34 min  Charges: OT General Charges $OT Visit: 1 Visit OT Treatments $Self Care/Home Management : 23-37 mins  Lindon Romp OT Acute Rehabilitation Services Office [phone]    Evette Georges 05/13/2023, 9:52 AM

## 2023-05-13 NOTE — TOC Progression Note (Signed)
Transition of Care [facility]) - Progression Note    Patient Details  Name: Katherine Navarro MRN: [id number] Date of Birth: Dec 08, 1955  [facility]) CM/SW Contact  Mearl Latin, LCSW Phone Number: 05/13/2023, 3:46 PM  Clinical Narrative:    CSW continuing to follow for medical progression.    Expected Discharge Plan: Skilled Nursing Facility Barriers to Discharge: Continued Medical Work up, English as a second language teacher, SNF Pending bed offer  Expected Discharge Plan and Services In-house Referral: Clinical Social Work   Post Acute Care Choice: Skilled Nursing Facility Living arrangements for the past 2 months: Single Family Home                                       Social Determinants of Health (SDOH) Interventions SDOH Screenings   Food Insecurity: No Food Insecurity (04/25/2023)  Housing: Low Risk  (04/25/2023)  Transportation Needs: No Transportation Needs (04/25/2023)  Utilities: Not At Risk (04/25/2023)  Depression (PHQ2-9): Low Risk  (10/31/2019)  Tobacco Use: Low Risk  (04/22/2023)    Readmission Risk Interventions     No data to display

## 2023-05-13 NOTE — Plan of Care (Signed)
  Problem: Coping: Goal: Ability to adjust to condition or change in health will improve Outcome: Progressing   Problem: Fluid Volume: Goal: Ability to maintain a balanced intake and output will improve Outcome: Progressing   Problem: Health Behavior/Discharge Planning: Goal: Ability to identify and utilize available resources and services will improve Outcome: Progressing Goal: Ability to manage health-related needs will improve Outcome: Progressing   Problem: Nutritional: Goal: Maintenance of adequate nutrition will improve Outcome: Progressing   Problem: Skin Integrity: Goal: Risk for impaired skin integrity will decrease Outcome: Progressing   Problem: Tissue Perfusion: Goal: Adequacy of tissue perfusion will improve Outcome: Progressing   Problem: Education: Goal: Individualized Educational Video(s) Outcome: Progressing   Problem: Fluid Volume: Goal: Ability to achieve a balanced intake and output will improve Outcome: Progressing   Problem: Nutritional: Goal: Maintenance of adequate nutrition will improve Outcome: Progressing   Problem: Respiratory: Goal: Will regain and/or maintain adequate ventilation Outcome: Progressing   Problem: Urinary Elimination: Goal: Ability to achieve and maintain adequate renal perfusion and functioning will improve Outcome: Progressing   Problem: Clinical Measurements: Goal: Will remain free from infection Outcome: Progressing Goal: Diagnostic test results will improve Outcome: Progressing Goal: Respiratory complications will improve Outcome: Progressing   Problem: Activity: Goal: Risk for activity intolerance will decrease Outcome: Progressing

## 2023-05-14 DIAGNOSIS — E512 Wernicke's encephalopathy: Secondary | ICD-10-CM | POA: Diagnosis not present

## 2023-05-14 DIAGNOSIS — E039 Hypothyroidism, unspecified: Secondary | ICD-10-CM | POA: Diagnosis not present

## 2023-05-14 DIAGNOSIS — E440 Moderate protein-calorie malnutrition: Secondary | ICD-10-CM | POA: Diagnosis not present

## 2023-05-14 DIAGNOSIS — R627 Adult failure to thrive: Secondary | ICD-10-CM | POA: Diagnosis not present

## 2023-05-14 LAB — GLUCOSE, CAPILLARY
Glucose-Capillary: 292 mg/dL — ABNORMAL HIGH (ref 70–99)
Glucose-Capillary: 274 mg/dL — ABNORMAL HIGH (ref 70–99)
Glucose-Capillary: 234 mg/dL — ABNORMAL HIGH (ref 70–99)
Glucose-Capillary: 228 mg/dL — ABNORMAL HIGH (ref 70–99)
Glucose-Capillary: 181 mg/dL — ABNORMAL HIGH (ref 70–99)

## 2023-05-14 LAB — BASIC METABOLIC PANEL
Anion gap: 9 (ref 5–15)
BUN: 46 mg/dL — ABNORMAL HIGH (ref 8–23)
CO2: 27 mmol/L (ref 22–32)
Calcium: 9.1 mg/dL (ref 8.9–10.3)
Chloride: 112 mmol/L — ABNORMAL HIGH (ref 98–111)
Creatinine, Ser: 1.03 mg/dL — ABNORMAL HIGH (ref 0.44–1.00)
GFR, Estimated: >60 mL/min (ref >60)
Glucose, Bld: 278 mg/dL — ABNORMAL HIGH (ref 70–99)
Potassium: 3.5 mmol/L (ref 3.5–5.1)
Sodium: 148 mmol/L — ABNORMAL HIGH (ref 135–145)

## 2023-05-14 MED ORDER — FREE WATER
400.0000 mL | Status: DC
  Administered 2023-05-14 – 2023-05-20 (×36): 400 mL

## 2023-05-14 MED ORDER — INSULIN GLARGINE-YFGN 100 UNIT/ML ~~LOC~~ SOLN
16.0000 [IU] | Freq: Every day | SUBCUTANEOUS | Status: DC
Start: 2023-05-14 — End: 2023-05-21
  Administered 2023-05-14 – 2023-05-20 (×7): 16 [IU] via SUBCUTANEOUS
  Filled 2023-05-14 (×8): qty 0.16

## 2023-05-14 NOTE — Progress Notes (Signed)
PROGRESS NOTE        PATIENT DETAILS Name: Katherine Navarro Age: 67 y.o. Sex: female Date of Birth: [birth year] Admit Date: 04/22/2023 Admitting Physician Buena Irish, MD [id number], Lelon Mast, [place]  Brief Summary: Patient is a 67 y.o.  female with history of DM-1, HTN, hypothyroidism-who was brought to the ED for weakness/progressive sleepiness-ongoing for several weeks-found to have acute metabolic encephalopathy in the setting of severe hypothyroidism with myxedema and vitamin B1 deficiency.  Significant events: 11/1>> admit to [facility]  Significant studies: 11/1>> CT head: No acute intracranial pathology 11/1>> CXR: No PNA 11/1>> TSH: 44 11/1>> FT4:<0.25 11/2>> Spot EEG: No seizures 11/2>> A1c: 10.5 11/2>> vitamin B1:<20 11/2>> vitamin B12: 1992 11/2>> NH4: 23 11/2>> RPR: Nonreactive 11/9>> renal ultrasound: Increased echogenicity of renal parenchyma bilaterally 11/10>> folate: 2.4 11/11>> right upper extremity Doppler: No DVT 11/18>> MRI brain: No acute findings 11/16>> TSH: 0.110  Significant microbiology data: 11/1>> urine culture: No growth 11/2>> blood culture: No growth  Procedures: 11/13>>Cortak tube  Consults: Neurology  Subjective: Unchanged-sleepy but awakes-minimal oral intake  Objective: Vitals: Blood pressure 139/70, pulse 93, temperature 98.1 F (36.7 C), temperature source Axillary, resp. rate 16, height 5\' 3"  (1.6 m), weight 67.1 kg, last menstrual period 05/03/2013, SpO2 93%.   Exam: Gen Exam:sleepy/lethargic-not in any distress HEENT:atraumatic, normocephalic Chest: B/L clear to auscultation anteriorly CVS:S1S2 regular Abdomen:soft non tender, non distended Extremities:no edema Neurology: Non focal-but w gen weakness Skin: no rash  Pertinent Labs/Radiology:    Latest Ref Rng & Units 05/11/2023    4:28 AM 05/10/2023    5:15 AM 05/09/2023    4:28 AM  CBC  WBC 4.0 - 10.5 K/uL 7.4  8.3  9.8   Hemoglobin 12.0  - 15.0 g/dL 9.5  21.3  9.1   Hematocrit 36.0 - 46.0 % 28.8  31.5  28.1   Platelets 150 - 400 K/uL 371  324  256     Lab Results  Component Value Date   NA 148 (H) 05/14/2023   K 3.5 05/14/2023   CL 112 (H) 05/14/2023   CO2 27 05/14/2023      Assessment/Plan: Acute metabolic encephalopathy Thought to be due to severe hypothyroidism with myxedema features and Wernicke's encephalopathy. Continue to treat underlying etiologies Mental status gradually improving-but still very lethargic/sleepy-with poor oral intake per RN. Will recheck ammonia levels tomorrow morning.  Severe hypothyroidism with myxedema Per H&P-noncompliant with levothyroxine-likely etiology for severe hypothyroidism. Continue levothyroxine-dosage adjusted 11/19 Repeat TSH in 1-2 weeks  Wernicke's encephalopathy Received several doses of high-dose IV thiamine Currently on oral thiamine supplementation  AKI on CKD stage IIIa AKI hemodynamically mediated- Renal function back to baseline with supportive care  Acute urinary retention Foley/Flomax Supportive care-mobilize-voiding trial when a little bit more awake/alert/mobilizes a bit more  Hypernatremia Due to poor oral intake Since continues to have persistent hypernatremia-Free water through PEG tube again adjusted today. Encourage oral intake. Recheck electrolytes tomorrow.  Hypokalemia Repleted  Failure to thrive syndrome Oropharyngeal dysphagia due to metabolic encephalopathy Remains lethargic/sleepy per RN limiting oral intake Calorie count in progress-if determined to have poor oral intake-will require PEG tube placement-ongoing discussions with family.  In interim-continue nocturnal tube feeds.  Normocytic anemia Secondary to acute/critical illness Required 1 unit of PRBC on 11/11-hemoglobin stable since then Follow CBC periodically  Right Upper extremity hematoma Supportive care  HTN All antihypertensives  on hold as BP stable without the  use of any medications  DM-1 Previously on insulin pump CBGs on the higher side-increase Semglee to 16 units Continue SSI Follow/optimize.  Recent Labs    05/13/23 2343 05/14/23 0410 05/14/23 0752  GLUCAP 115* 292* 274*    Nutrition Status: Nutrition Problem: Moderate Malnutrition Etiology: chronic illness Signs/Symptoms: meal completion < 25%, moderate fat depletion, moderate muscle depletion Interventions: Ensure Enlive (each supplement provides 350kcal and 20 grams of protein), MVI, Magic cup  BMI: Estimated body mass index is 26.2 kg/m as calculated from the following:   Height as of this encounter: 5\' 3"  (1.6 m).   Weight as of this encounter: 67.1 kg.   Code status:   Code Status: Full Code   DVT Prophylaxis: enoxaparin (LOVENOX) injection 40 mg Start: 05/07/23 1245 Place and maintain sequential compression device Start: 05/03/23 1035   Family Communication: Spouse-[name]-updated 11/21   Disposition Plan: Status is: Inpatient Remains inpatient appropriate because: Severity of illness   Planned Discharge Destination:Skilled nursing facility   Diet: Diet Order             DIET DYS 3 Room service appropriate? Yes with Assist; Fluid consistency: Thin  Diet effective now                     Antimicrobial agents: Anti-infectives (From admission, onward)    None        MEDICATIONS: Scheduled Meds:  (feeding supplement) PROSource Plus  30 mL Oral BID BM   Chlorhexidine Gluconate Cloth  6 each Topical Q0600   docusate sodium  100 mg Oral BID   enoxaparin (LOVENOX) injection  40 mg Subcutaneous Q24H   famotidine  20 mg Per Tube Daily   feeding supplement (PROSource TF20)  60 mL Per Tube Daily   folic acid  2 mg Per Tube Daily   free water  400 mL Per Tube Q4H   insulin aspart  0-15 Units Subcutaneous Q4H   insulin glargine-yfgn  16 Units Subcutaneous Daily   lactose free nutrition  237 mL Oral TID WC   levothyroxine  75 mcg Oral  Q0600   multivitamin  1 tablet Oral Daily   mouth rinse  15 mL Mouth Rinse 4 times per day   polyethylene glycol  17 g Per Tube BID   tamsulosin  0.4 mg Oral Daily   thiamine  100 mg Per Tube Daily   Continuous Infusions:  feeding supplement (GLUCERNA 1.5 CAL) 1,000 mL (05/12/23 2151)   PRN Meds:.acetaminophen **OR** acetaminophen, dextrose, [DISCONTINUED] ondansetron **OR** ondansetron (ZOFRAN) IV, mouth rinse, sorbitol   I have personally reviewed following labs and imaging studies  LABORATORY DATA: CBC: Recent Labs  Lab 05/08/23 0322 05/09/23 0428 05/10/23 0515 05/11/23 0428  WBC 7.8 9.8 8.3 7.4  HGB 8.9* 9.1* 10.2* 9.5*  HCT 27.6* 28.1* 31.5* 28.8*  MCV 87.6 88.4 87.7 88.9  PLT 232 256 324 371    Basic Metabolic Panel: Recent Labs  Lab 05/10/23 0515 05/11/23 0428 05/12/23 0425 05/13/23 0352 05/14/23 0353  NA 144 149* 148* 148* 148*  K 4.0 3.4* 4.0 3.9 3.5  CL 105 110 112* 114* 112*  CO2 28 29 25 24 27   GLUCOSE 149* 169* 378* 402* 278*  BUN 68* 53* 49* 47* 46*  CREATININE 1.04* 0.88 1.00 1.06* 1.03*  CALCIUM 9.8 9.3 9.6 9.6 9.1    GFR: Estimated Creatinine Clearance: 50.1 mL/min (A) (by C-G formula based on SCr of 1.03 mg/dL (H)).  Liver Function Tests: No results for input(s): "AST", "ALT", "ALKPHOS", "BILITOT", "PROT", "ALBUMIN" in the last 168 hours. No results for input(s): "LIPASE", "AMYLASE" in the last 168 hours. No results for input(s): "AMMONIA" in the last 168 hours.  Coagulation Profile: No results for input(s): "INR", "PROTIME" in the last 168 hours.  Cardiac Enzymes: No results for input(s): "CKTOTAL", "CKMB", "CKMBINDEX", "TROPONINI" in the last 168 hours.  BNP (last 3 results) No results for input(s): "PROBNP" in the last 8760 hours.  Lipid Profile: No results for input(s): "CHOL", "HDL", "LDLCALC", "TRIG", "CHOLHDL", "LDLDIRECT" in the last 72 hours.  Thyroid Function Tests: No results for input(s): "TSH", "T4TOTAL", "FREET4",  "T3FREE", "THYROIDAB" in the last 72 hours.  Anemia Panel: No results for input(s): "VITAMINB12", "FOLATE", "FERRITIN", "TIBC", "IRON", "RETICCTPCT" in the last 72 hours.   Urine analysis:    Component Value Date/Time   COLORURINE YELLOW 04/22/2023 1626   APPEARANCEUR CLEAR 04/22/2023 1626   LABSPEC 1.015 04/22/2023 1626   PHURINE 6.0 04/22/2023 1626   GLUCOSEU 50 (A) 04/22/2023 1626   GLUCOSEU >=1000 (A) 12/16/2017 1110   HGBUR NEGATIVE 04/22/2023 1626   HGBUR moderate 02/01/2008 0902   BILIRUBINUR NEGATIVE 04/22/2023 1626   KETONESUR 5 (A) 04/22/2023 1626   PROTEINUR NEGATIVE 04/22/2023 1626   UROBILINOGEN 0.2 12/16/2017 1110   NITRITE NEGATIVE 04/22/2023 1626   LEUKOCYTESUR NEGATIVE 04/22/2023 1626    Sepsis Labs: Lactic Acid, Venous    Component Value Date/Time   LATICACIDVEN 2.1 (HH) 04/22/2023 2039    MICROBIOLOGY: No results found for this or any previous visit (from the past 240 hour(s)).  RADIOLOGY STUDIES/RESULTS: No results found.   LOS: 22 days   Jeoffrey Massed, MD  [facility]    To contact the attending provider between 7A-7P or the covering provider during after hours 7P-7A, please log into the web site [email or web address] and access using universal [place] password for that web site. If you do not have the password, please call the hospital operator.  05/14/2023, 11:12 AM

## 2023-05-14 NOTE — Plan of Care (Signed)
  Problem: Education: Goal: Ability to describe self-care measures that may prevent or decrease complications (Diabetes Survival Skills Education) will improve Outcome: Progressing   Problem: Coping: Goal: Ability to adjust to condition or change in health will improve Outcome: Progressing   Problem: Fluid Volume: Goal: Ability to maintain a balanced intake and output will improve Outcome: Progressing   Problem: Metabolic: Goal: Ability to maintain appropriate glucose levels will improve Outcome: Progressing   Problem: Nutritional: Goal: Maintenance of adequate nutrition will improve Outcome: Progressing   Problem: Skin Integrity: Goal: Risk for impaired skin integrity will decrease Outcome: Progressing   Problem: Respiratory: Goal: Will regain and/or maintain adequate ventilation Outcome: Progressing   Problem: Education: Goal: Knowledge of General Education information will improve Description: Including pain rating scale, medication(s)/side effects and non-pharmacologic comfort measures Outcome: Progressing   Problem: Clinical Measurements: Goal: Will remain free from infection Outcome: Progressing Goal: Diagnostic test results will improve Outcome: Progressing Goal: Cardiovascular complication will be avoided Outcome: Progressing   Problem: Activity: Goal: Risk for activity intolerance will decrease Outcome: Progressing   Problem: Nutrition: Goal: Adequate nutrition will be maintained Outcome: Progressing   Problem: Coping: Goal: Level of anxiety will decrease Outcome: Progressing   Problem: Elimination: Goal: Will not experience complications related to bowel motility Outcome: Progressing

## 2023-05-14 NOTE — Plan of Care (Signed)
  Problem: Coping: Goal: Ability to adjust to condition or change in health will improve Outcome: Progressing   Problem: Metabolic: Goal: Ability to maintain appropriate glucose levels will improve Outcome: Progressing   Problem: Nutritional: Goal: Maintenance of adequate nutrition will improve Outcome: Not Progressing

## 2023-05-14 NOTE — Plan of Care (Signed)
  Problem: Education: Goal: Ability to describe self-care measures that may prevent or decrease complications (Diabetes Survival Skills Education) will improve Outcome: Progressing   Problem: Coping: Goal: Ability to adjust to condition or change in health will improve Outcome: Progressing   Problem: Fluid Volume: Goal: Ability to maintain a balanced intake and output will improve Outcome: Progressing   Problem: Health Behavior/Discharge Planning: Goal: Ability to identify and utilize available resources and services will improve Outcome: Progressing Goal: Ability to manage health-related needs will improve Outcome: Progressing   Problem: Metabolic: Goal: Ability to maintain appropriate glucose levels will improve Outcome: Progressing   Problem: Nutritional: Goal: Maintenance of adequate nutrition will improve Outcome: Progressing Goal: Progress toward achieving an optimal weight will improve Outcome: Progressing   Problem: Skin Integrity: Goal: Risk for impaired skin integrity will decrease Outcome: Progressing

## 2023-05-15 DIAGNOSIS — E440 Moderate protein-calorie malnutrition: Secondary | ICD-10-CM | POA: Diagnosis not present

## 2023-05-15 DIAGNOSIS — E512 Wernicke's encephalopathy: Secondary | ICD-10-CM | POA: Diagnosis not present

## 2023-05-15 DIAGNOSIS — E039 Hypothyroidism, unspecified: Secondary | ICD-10-CM | POA: Diagnosis not present

## 2023-05-15 DIAGNOSIS — R627 Adult failure to thrive: Secondary | ICD-10-CM | POA: Diagnosis not present

## 2023-05-15 LAB — BASIC METABOLIC PANEL
Anion gap: 11 (ref 5–15)
BUN: 46 mg/dL — ABNORMAL HIGH (ref 8–23)
CO2: 24 mmol/L (ref 22–32)
Calcium: 9.4 mg/dL (ref 8.9–10.3)
Chloride: 110 mmol/L (ref 98–111)
Creatinine, Ser: 0.92 mg/dL (ref 0.44–1.00)
GFR, Estimated: >60 mL/min (ref >60)
Glucose, Bld: 205 mg/dL — ABNORMAL HIGH (ref 70–99)
Potassium: 3.8 mmol/L (ref 3.5–5.1)
Sodium: 145 mmol/L (ref 135–145)

## 2023-05-15 LAB — GLUCOSE, CAPILLARY
Glucose-Capillary: 167 mg/dL — ABNORMAL HIGH (ref 70–99)
Glucose-Capillary: 184 mg/dL — ABNORMAL HIGH (ref 70–99)
Glucose-Capillary: 203 mg/dL — ABNORMAL HIGH (ref 70–99)
Glucose-Capillary: 225 mg/dL — ABNORMAL HIGH (ref 70–99)
Glucose-Capillary: 227 mg/dL — ABNORMAL HIGH (ref 70–99)
Glucose-Capillary: 303 mg/dL — ABNORMAL HIGH (ref 70–99)
Glucose-Capillary: 343 mg/dL — ABNORMAL HIGH (ref 70–99)

## 2023-05-15 MED ORDER — POLYETHYLENE GLYCOL 3350 17 G PO PACK
17.0000 g | PACK | Freq: Two times a day (BID) | ORAL | Status: DC
  Filled 2023-05-15 (×3): qty 1

## 2023-05-15 MED ORDER — ACETAMINOPHEN 650 MG RE SUPP: 650.0000 mg | Freq: Four times a day (QID) | RECTAL | Status: DC | PRN

## 2023-05-15 MED ORDER — THIAMINE MONONITRATE 100 MG PO TABS
100.0000 mg | ORAL_TABLET | Freq: Every day | ORAL | Status: DC
  Administered 2023-05-16 – 2023-05-20 (×4): 100 mg via ORAL
  Filled 2023-05-15 (×4): qty 1

## 2023-05-15 MED ORDER — FOLIC ACID 1 MG PO TABS
2.0000 mg | ORAL_TABLET | Freq: Every day | ORAL | Status: DC
  Administered 2023-05-16 – 2023-05-20 (×4): 2 mg via ORAL
  Filled 2023-05-15 (×4): qty 2

## 2023-05-15 MED ORDER — FAMOTIDINE 20 MG PO TABS
20.0000 mg | ORAL_TABLET | Freq: Every day | ORAL | Status: DC
Start: 2023-05-16 — End: 2023-05-21
  Administered 2023-05-16 – 2023-05-20 (×4): 20 mg via ORAL
  Filled 2023-05-15 (×4): qty 1

## 2023-05-15 MED ORDER — ACETAMINOPHEN 325 MG PO TABS: 650.0000 mg | ORAL_TABLET | Freq: Four times a day (QID) | ORAL | Status: DC | PRN

## 2023-05-15 MED ORDER — SODIUM CHLORIDE 0.9 % IV SOLN: INTRAVENOUS | Status: AC

## 2023-05-15 NOTE — Progress Notes (Signed)
Nocturnal feed started at this time per orders changed tubing.

## 2023-05-15 NOTE — Progress Notes (Signed)
PROGRESS NOTE        PATIENT DETAILS Name: Katherine Navarro Age: 67 y.o. Sex: female Date of Birth: 11/12/1955 Admit Date: 04/22/2023 Admitting Physician Buena Irish, MD [id number], Lelon Mast, [place]  Brief Summary: Patient is a 67 y.o.  female with history of DM-1, HTN, hypothyroidism-who was brought to the ED for weakness/progressive sleepiness-ongoing for several weeks-found to have acute metabolic encephalopathy in the setting of severe hypothyroidism with myxedema and vitamin B1 deficiency.  Significant events: 11/1>> admit to [facility]  Significant studies: 11/1>> CT head: No acute intracranial pathology 11/1>> CXR: No PNA 11/1>> TSH: 44 11/1>> FT4:<0.25 11/2>> Spot EEG: No seizures 11/2>> A1c: 10.5 11/2>> vitamin B1:<20 11/2>> vitamin B12: 1992 11/2>> NH4: 23 11/2>> RPR: Nonreactive 11/9>> renal ultrasound: Increased echogenicity of renal parenchyma bilaterally 11/10>> folate: 2.4 11/11>> right upper extremity Doppler: No DVT 11/18>> MRI brain: No acute findings 11/16>> TSH: 0.110  Significant microbiology data: 11/1>> urine culture: No growth 11/2>> blood culture: No growth  Procedures: 11/13>>Cortak tube  Consults: Neurology  Subjective: Sleepy-but awakes-answer simple questions appropriately.  Oral intake continues to be very poor.  Objective: Vitals: Blood pressure 131/64, pulse 79, temperature 98.3 F (36.8 C), temperature source Oral, resp. rate 15, height 5\' 3"  (1.6 m), weight 66.9 kg, last menstrual period 05/03/2013, SpO2 95%.   Exam: Easily awakes-follows commands-appears very lethargic at times Nonfocal exam-but generalized weakness Abdomen is soft nontender.    Pertinent Labs/Radiology:    Latest Ref Rng & Units 05/11/2023    4:28 AM 05/10/2023    5:15 AM 05/09/2023    4:28 AM  CBC  WBC 4.0 - 10.5 K/uL 7.4  8.3  9.8   Hemoglobin 12.0 - 15.0 g/dL 9.5  34.7  9.1   Hematocrit 36.0 - 46.0 % 28.8  31.5  28.1    Platelets 150 - 400 K/uL 371  324  256     Lab Results  Component Value Date   NA 145 05/15/2023   K 3.8 05/15/2023   CL 110 05/15/2023   CO2 24 05/15/2023      Assessment/Plan: Acute metabolic encephalopathy Thought to be due to severe hypothyroidism with myxedema features and Wernicke's encephalopathy. Continue to treat underlying etiologies Mental status gradually improving but still with lethargy/excessive sleepiness.   Per neurology-encephalopathy can take months/weeks to resolve in the setting..  Severe hypothyroidism with myxedema Per H&P-noncompliant with levothyroxine-likely etiology for severe hypothyroidism. Continue levothyroxine-dosage adjusted 11/19 Repeat TSH in 1-2 weeks  Wernicke's encephalopathy Received several doses of high-dose IV thiamine Currently on oral thiamine supplementation  AKI on CKD stage IIIa AKI hemodynamically mediated- Renal function back to baseline with supportive care  Acute urinary retention Foley/Flomax Supportive care-mobilize-voiding trial when a little bit more awake/alert/mobilizes a bit more  Hypernatremia Due to poor oral intake Continue free water flushes-sodium much better today.  Hypokalemia Repleted  Failure to thrive syndrome Oropharyngeal dysphagia due to metabolic encephalopathy Remains lethargic/sleepy per RN limiting oral intake Calorie count in progress-very poor oral intake-likely will require PEG tube-will discuss with spouse later today (left voicemail earlier this morning).   In interim-continue nocturnal tube feeds.  Normocytic anemia Secondary to acute/critical illness Required 1 unit of PRBC on 11/11-hemoglobin stable since then Follow CBC periodically  Right Upper extremity hematoma Supportive care  HTN All antihypertensives on hold as BP stable without the use of any medications  DM-1 Previously on insulin pump CBGs relatively stable Continue Semglee 16 units daily/SSI Reassess  11/25  Recent Labs    05/15/23 0011 05/15/23 0339 05/15/23 0759  GLUCAP 203* 184* 225*    Nutrition Status: Nutrition Problem: Moderate Malnutrition Etiology: chronic illness Signs/Symptoms: meal completion < 25%, moderate fat depletion, moderate muscle depletion Interventions: Ensure Enlive (each supplement provides 350kcal and 20 grams of protein), MVI, Magic cup  BMI: Estimated body mass index is 26.13 kg/m as calculated from the following:   Height as of this encounter: 5\' 3"  (1.6 m).   Weight as of this encounter: 66.9 kg.   Code status:   Code Status: Full Code   DVT Prophylaxis: enoxaparin (LOVENOX) injection 40 mg Start: 05/07/23 1245 Place and maintain sequential compression device Start: 05/03/23 1035   Family Communication: Spouse-[name]-[phone]-left voicemail on 11/24   Disposition Plan: Status is: Inpatient Remains inpatient appropriate because: Severity of illness   Planned Discharge Destination:Skilled nursing facility   Diet: Diet Order             DIET DYS 3 Room service appropriate? Yes with Assist; Fluid consistency: Thin  Diet effective now                     Antimicrobial agents: Anti-infectives (From admission, onward)    None        MEDICATIONS: Scheduled Meds:  (feeding supplement) PROSource Plus  30 mL Oral BID BM   Chlorhexidine Gluconate Cloth  6 each Topical Q0600   docusate sodium  100 mg Oral BID   enoxaparin (LOVENOX) injection  40 mg Subcutaneous Q24H   famotidine  20 mg Per Tube Daily   feeding supplement (PROSource TF20)  60 mL Per Tube Daily   folic acid  2 mg Per Tube Daily   free water  400 mL Per Tube Q4H   insulin aspart  0-15 Units Subcutaneous Q4H   insulin glargine-yfgn  16 Units Subcutaneous Daily   lactose free nutrition  237 mL Oral TID WC   levothyroxine  75 mcg Oral Q0600   multivitamin  1 tablet Oral Daily   mouth rinse  15 mL Mouth Rinse 4 times per day   polyethylene glycol  17 g Per  Tube BID   tamsulosin  0.4 mg Oral Daily   thiamine  100 mg Per Tube Daily   Continuous Infusions:  feeding supplement (GLUCERNA 1.5 CAL) 1,000 mL (05/14/23 2021)   PRN Meds:.acetaminophen **OR** acetaminophen, dextrose, [DISCONTINUED] ondansetron **OR** ondansetron (ZOFRAN) IV, mouth rinse, sorbitol   I have personally reviewed following labs and imaging studies  LABORATORY DATA: CBC: Recent Labs  Lab 05/09/23 0428 05/10/23 0515 05/11/23 0428  WBC 9.8 8.3 7.4  HGB 9.1* 10.2* 9.5*  HCT 28.1* 31.5* 28.8*  MCV 88.4 87.7 88.9  PLT 256 324 371    Basic Metabolic Panel: Recent Labs  Lab 05/11/23 0428 05/12/23 0425 05/13/23 0352 05/14/23 0353 05/15/23 0240  NA 149* 148* 148* 148* 145  K 3.4* 4.0 3.9 3.5 3.8  CL 110 112* 114* 112* 110  CO2 29 25 24 27 24   GLUCOSE 169* 378* 402* 278* 205*  BUN 53* 49* 47* 46* 46*  CREATININE 0.88 1.00 1.06* 1.03* 0.92  CALCIUM 9.3 9.6 9.6 9.1 9.4    GFR: Estimated Creatinine Clearance: 56 mL/min (by C-G formula based on SCr of 0.92 mg/dL).  Liver Function Tests: No results for input(s): "AST", "ALT", "ALKPHOS", "BILITOT", "PROT", "ALBUMIN" in the last 168 hours. No results  for input(s): "LIPASE", "AMYLASE" in the last 168 hours. No results for input(s): "AMMONIA" in the last 168 hours.  Coagulation Profile: No results for input(s): "INR", "PROTIME" in the last 168 hours.  Cardiac Enzymes: No results for input(s): "CKTOTAL", "CKMB", "CKMBINDEX", "TROPONINI" in the last 168 hours.  BNP (last 3 results) No results for input(s): "PROBNP" in the last 8760 hours.  Lipid Profile: No results for input(s): "CHOL", "HDL", "LDLCALC", "TRIG", "CHOLHDL", "LDLDIRECT" in the last 72 hours.  Thyroid Function Tests: No results for input(s): "TSH", "T4TOTAL", "FREET4", "T3FREE", "THYROIDAB" in the last 72 hours.  Anemia Panel: No results for input(s): "VITAMINB12", "FOLATE", "FERRITIN", "TIBC", "IRON", "RETICCTPCT" in the last 72  hours.   Urine analysis:    Component Value Date/Time   COLORURINE YELLOW 04/22/2023 1626   APPEARANCEUR CLEAR 04/22/2023 1626   LABSPEC 1.015 04/22/2023 1626   PHURINE 6.0 04/22/2023 1626   GLUCOSEU 50 (A) 04/22/2023 1626   GLUCOSEU >=1000 (A) 12/16/2017 1110   HGBUR NEGATIVE 04/22/2023 1626   HGBUR moderate 02/01/2008 0902   BILIRUBINUR NEGATIVE 04/22/2023 1626   KETONESUR 5 (A) 04/22/2023 1626   PROTEINUR NEGATIVE 04/22/2023 1626   UROBILINOGEN 0.2 12/16/2017 1110   NITRITE NEGATIVE 04/22/2023 1626   LEUKOCYTESUR NEGATIVE 04/22/2023 1626    Sepsis Labs: Lactic Acid, Venous    Component Value Date/Time   LATICACIDVEN 2.1 (HH) 04/22/2023 2039    MICROBIOLOGY: No results found for this or any previous visit (from the past 240 hour(s)).  RADIOLOGY STUDIES/RESULTS: No results found.   LOS: 23 days   Jeoffrey Massed, MD  [facility]    To contact the attending provider between 7A-7P or the covering provider during after hours 7P-7A, please log into the web site [email or web address] and access using universal [place] password for that web site. If you do not have the password, please call the hospital operator.  05/15/2023, 9:54 AM

## 2023-05-16 DIAGNOSIS — E039 Hypothyroidism, unspecified: Secondary | ICD-10-CM | POA: Diagnosis not present

## 2023-05-16 DIAGNOSIS — R4182 Altered mental status, unspecified: Secondary | ICD-10-CM | POA: Diagnosis not present

## 2023-05-16 DIAGNOSIS — E440 Moderate protein-calorie malnutrition: Secondary | ICD-10-CM | POA: Diagnosis not present

## 2023-05-16 LAB — BASIC METABOLIC PANEL
Anion gap: 5 (ref 5–15)
BUN: 40 mg/dL — ABNORMAL HIGH (ref 8–23)
CO2: 25 mmol/L (ref 22–32)
Calcium: 8.3 mg/dL — ABNORMAL LOW (ref 8.9–10.3)
Chloride: 107 mmol/L (ref 98–111)
Creatinine, Ser: 0.79 mg/dL (ref 0.44–1.00)
GFR, Estimated: >60 mL/min (ref >60)
Glucose, Bld: 198 mg/dL — ABNORMAL HIGH (ref 70–99)
Potassium: 4.1 mmol/L (ref 3.5–5.1)
Sodium: 137 mmol/L (ref 135–145)

## 2023-05-16 LAB — CBC
HCT: 29 % — ABNORMAL LOW (ref 36.0–46.0)
Hemoglobin: 9.1 g/dL — ABNORMAL LOW (ref 12.0–15.0)
MCH: 28.4 pg (ref 26.0–34.0)
MCHC: 31.4 g/dL (ref 30.0–36.0)
MCV: 90.6 fL (ref 80.0–100.0)
Platelets: 587 10*3/uL — ABNORMAL HIGH (ref 150–400)
RBC: 3.2 MIL/uL — ABNORMAL LOW (ref 3.87–5.11)
RDW: 15.9 % — ABNORMAL HIGH (ref 11.5–15.5)
WBC: 12 10*3/uL — ABNORMAL HIGH (ref 4.0–10.5)
nRBC: 0.6 % — ABNORMAL HIGH (ref 0.0–0.2)

## 2023-05-16 LAB — GLUCOSE, CAPILLARY
Glucose-Capillary: 169 mg/dL — ABNORMAL HIGH (ref 70–99)
Glucose-Capillary: 206 mg/dL — ABNORMAL HIGH (ref 70–99)
Glucose-Capillary: 209 mg/dL — ABNORMAL HIGH (ref 70–99)
Glucose-Capillary: 175 mg/dL — ABNORMAL HIGH (ref 70–99)
Glucose-Capillary: 127 mg/dL — ABNORMAL HIGH (ref 70–99)

## 2023-05-16 LAB — AMMONIA: Ammonia: 26 umol/L (ref 9–35)

## 2023-05-16 MED ORDER — GLUCERNA 1.5 CAL PO LIQD
1000.0000 mL | ORAL | Status: DC
  Administered 2023-05-16 – 2023-05-18 (×3): 1000 mL
  Filled 2023-05-16 (×7): qty 1000

## 2023-05-16 MED ORDER — PROSOURCE TF20 ENFIT COMPATIBL EN LIQD
60.0000 mL | Freq: Two times a day (BID) | ENTERAL | Status: DC
Start: 2023-05-16 — End: 2023-05-21
  Administered 2023-05-16 – 2023-05-20 (×8): 60 mL
  Filled 2023-05-16 (×8): qty 60

## 2023-05-16 NOTE — Progress Notes (Addendum)
PROGRESS NOTE        PATIENT DETAILS Name: Katherine Navarro Age: 67 y.o. Sex: female Date of Birth: [birth year] Admit Date: 04/22/2023 Admitting Physician Buena Irish, MD [id number], Lelon Mast, [place]  Brief Summary: Patient is a 67 y.o.  female with history of DM-1, HTN, hypothyroidism-who was brought to the ED for weakness/progressive sleepiness-ongoing for several weeks-found to have acute metabolic encephalopathy in the setting of severe hypothyroidism with myxedema and vitamin B1 deficiency.  Significant events: 11/1>> admit to [facility]  Significant studies: 11/1>> CT head: No acute intracranial pathology 11/1>> CXR: No PNA 11/1>> TSH: 44 11/1>> FT4:<0.25 11/2>> Spot EEG: No seizures 11/2>> A1c: 10.5 11/2>> vitamin B1:<20 11/2>> vitamin B12: 1992 11/2>> NH4: 23 11/2>> RPR: Nonreactive 11/9>> renal ultrasound: Increased echogenicity of renal parenchyma bilaterally 11/10>> folate: 2.4 11/11>> right upper extremity Doppler: No DVT 11/18>> MRI brain: No acute findings 11/16>> TSH: 0.110  Significant microbiology data: 11/1>> urine culture: No growth 11/2>> blood culture: No growth  Procedures: 11/13>>Cortak tube  Consults: Neurology  Subjective: No major issues overnight-awake-answer some questions appropriately-oral intake continues to be erratic but mostly poor.  Spoke to husband yesterday-he was not able to feed her yesterday as she was sleepy.  Objective: Vitals: Blood pressure 119/69, pulse 83, temperature 98.5 F (36.9 C), temperature source Oral, resp. rate 15, height 5\' 3"  (1.6 m), weight 67.3 kg, last menstrual period 05/03/2013, SpO2 98%.   Exam: Awake and relatively alert-follows simple commands Abdomen is soft nontender nondistended Extremities no edema Severe generalized weakness but relatively nonfocal exam.  Pertinent Labs/Radiology:    Latest Ref Rng & Units 05/16/2023    4:46 AM 05/11/2023    4:28 AM 05/10/2023     5:15 AM  CBC  WBC 4.0 - 10.5 K/uL 12.0  7.4  8.3   Hemoglobin 12.0 - 15.0 g/dL 9.1  9.5  62.9   Hematocrit 36.0 - 46.0 % 29.0  28.8  31.5   Platelets 150 - 400 K/uL 587  371  324     Lab Results  Component Value Date   NA 137 05/16/2023   K 4.1 05/16/2023   CL 107 05/16/2023   CO2 25 05/16/2023      Assessment/Plan: Acute metabolic encephalopathy Thought to be due to severe hypothyroidism with myxedema features and Wernicke's encephalopathy. Continue to treat underlying etiologies Mental status gradually improving but still with lethargy/excessive sleepiness.   Per neurology-encephalopathy can take months/weeks to resolve in the setting..  Severe hypothyroidism with myxedema Per H&P-noncompliant with levothyroxine-likely etiology for severe hypothyroidism. Continue levothyroxine-dosage adjusted 11/19 Repeat TSH in 1-2 weeks  Wernicke's encephalopathy Received several doses of high-dose IV thiamine Currently on oral thiamine supplementation  AKI on CKD stage IIIa AKI hemodynamically mediated- Renal function back to baseline with supportive care  Acute urinary retention Foley/Flomax Supportive care-mobilize-voiding trial when a little bit more awake/alert/mobilizes a bit more  Hypernatremia Resolved with free water flushes.  Hypokalemia Repleted  Failure to thrive syndrome Oropharyngeal dysphagia due to metabolic encephalopathy Waxing/waning encephalopathy continues to be the limiting factor for oral intake Has a cortrak tube in place since 11/13-calorie count with inadequate oral intake Extensive discussion with the husband over the past several days-since encephalopathy gradually improving-and per neurology-this could take several months-he is okay to proceed with a PEG tube placement.  Will consult IR. Continue tube feeds in the interim.  Normocytic anemia Secondary to acute/critical illness Required 1 unit of PRBC on 11/11-hemoglobin stable since then Follow  CBC periodically  Right Upper extremity hematoma Supportive care  HTN All antihypertensives on hold as BP stable without the use of any medications  DM-1 Previously on insulin pump CBGs relatively stable Continue Semglee 16 units daily/SSI Reassess 11/26  Recent Labs    05/15/23 2325 05/16/23 0336 05/16/23 0838  GLUCAP 167* 169* 206*    Nutrition Status: Nutrition Problem: Moderate Malnutrition Etiology: chronic illness Signs/Symptoms: meal completion < 25%, moderate fat depletion, moderate muscle depletion Interventions: Ensure Enlive (each supplement provides 350kcal and 20 grams of protein), MVI, Magic cup  BMI: Estimated body mass index is 26.28 kg/m as calculated from the following:   Height as of this encounter: 5\' 3"  (1.6 m).   Weight as of this encounter: 67.3 kg.   Code status:   Code Status: Full Code   DVT Prophylaxis: enoxaparin (LOVENOX) injection 40 mg Start: 05/07/23 1245 Place and maintain sequential compression device Start: 05/03/23 1035   Family Communication: Spouse-[name]-[phone] updated on 11/25.   Disposition Plan: Status is: Inpatient Remains inpatient appropriate because: Severity of illness   Planned Discharge Destination:Skilled nursing facility   Diet: Diet Order             DIET DYS 3 Room service appropriate? Yes with Assist; Fluid consistency: Thin  Diet effective now                     Antimicrobial agents: Anti-infectives (From admission, onward)    None        MEDICATIONS: Scheduled Meds:  (feeding supplement) PROSource Plus  30 mL Oral BID BM   Chlorhexidine Gluconate Cloth  6 each Topical Q0600   docusate sodium  100 mg Oral BID   enoxaparin (LOVENOX) injection  40 mg Subcutaneous Q24H   famotidine  20 mg Oral Daily   feeding supplement (PROSource TF20)  60 mL Per Tube Daily   folic acid  2 mg Oral Daily   free water  400 mL Per Tube Q4H   insulin aspart  0-15 Units Subcutaneous Q4H   insulin  glargine-yfgn  16 Units Subcutaneous Daily   lactose free nutrition  237 mL Oral TID WC   levothyroxine  75 mcg Oral Q0600   multivitamin  1 tablet Oral Daily   mouth rinse  15 mL Mouth Rinse 4 times per day   polyethylene glycol  17 g Oral BID   tamsulosin  0.4 mg Oral Daily   thiamine  100 mg Oral Daily   Continuous Infusions:  feeding supplement (GLUCERNA 1.5 CAL) 1,000 mL (05/14/23 2021)   PRN Meds:.acetaminophen **OR** acetaminophen, dextrose, [DISCONTINUED] ondansetron **OR** ondansetron (ZOFRAN) IV, mouth rinse, sorbitol   I have personally reviewed following labs and imaging studies  LABORATORY DATA: CBC: Recent Labs  Lab 05/10/23 0515 05/11/23 0428 05/16/23 0446  WBC 8.3 7.4 12.0*  HGB 10.2* 9.5* 9.1*  HCT 31.5* 28.8* 29.0*  MCV 87.7 88.9 90.6  PLT 324 371 587*    Basic Metabolic Panel: Recent Labs  Lab 05/12/23 0425 05/13/23 0352 05/14/23 0353 05/15/23 0240 05/16/23 0446  NA 148* 148* 148* 145 137  K 4.0 3.9 3.5 3.8 4.1  CL 112* 114* 112* 110 107  CO2 25 24 27 24 25   GLUCOSE 378* 402* 278* 205* 198*  BUN 49* 47* 46* 46* 40*  CREATININE 1.00 1.06* 1.03* 0.92 0.79  CALCIUM 9.6 9.6 9.1 9.4 8.3*  GFR: Estimated Creatinine Clearance: 64.6 mL/min (by C-G formula based on SCr of 0.79 mg/dL).  Liver Function Tests: No results for input(s): "AST", "ALT", "ALKPHOS", "BILITOT", "PROT", "ALBUMIN" in the last 168 hours. No results for input(s): "LIPASE", "AMYLASE" in the last 168 hours. Recent Labs  Lab 05/16/23 0446  AMMONIA 26    Coagulation Profile: No results for input(s): "INR", "PROTIME" in the last 168 hours.  Cardiac Enzymes: No results for input(s): "CKTOTAL", "CKMB", "CKMBINDEX", "TROPONINI" in the last 168 hours.  BNP (last 3 results) No results for input(s): "PROBNP" in the last 8760 hours.  Lipid Profile: No results for input(s): "CHOL", "HDL", "LDLCALC", "TRIG", "CHOLHDL", "LDLDIRECT" in the last 72 hours.  Thyroid Function  Tests: No results for input(s): "TSH", "T4TOTAL", "FREET4", "T3FREE", "THYROIDAB" in the last 72 hours.  Anemia Panel: No results for input(s): "VITAMINB12", "FOLATE", "FERRITIN", "TIBC", "IRON", "RETICCTPCT" in the last 72 hours.   Urine analysis:    Component Value Date/Time   COLORURINE YELLOW 04/22/2023 1626   APPEARANCEUR CLEAR 04/22/2023 1626   LABSPEC 1.015 04/22/2023 1626   PHURINE 6.0 04/22/2023 1626   GLUCOSEU 50 (A) 04/22/2023 1626   GLUCOSEU >=1000 (A) 12/16/2017 1110   HGBUR NEGATIVE 04/22/2023 1626   HGBUR moderate 02/01/2008 0902   BILIRUBINUR NEGATIVE 04/22/2023 1626   KETONESUR 5 (A) 04/22/2023 1626   PROTEINUR NEGATIVE 04/22/2023 1626   UROBILINOGEN 0.2 12/16/2017 1110   NITRITE NEGATIVE 04/22/2023 1626   LEUKOCYTESUR NEGATIVE 04/22/2023 1626    Sepsis Labs: Lactic Acid, Venous    Component Value Date/Time   LATICACIDVEN 2.1 (HH) 04/22/2023 2039    MICROBIOLOGY: No results found for this or any previous visit (from the past 240 hour(s)).  RADIOLOGY STUDIES/RESULTS: No results found.   LOS: 24 days   Jeoffrey Massed, MD  [facility]    To contact the attending provider between 7A-7P or the covering provider during after hours 7P-7A, please log into the web site [email or web address] and access using universal [place] password for that web site. If you do not have the password, please call the hospital operator.  05/16/2023, 10:09 AM

## 2023-05-16 NOTE — Progress Notes (Signed)
Nutrition Follow-up  DOCUMENTATION CODES:   Non-severe (moderate) malnutrition in context of chronic illness  INTERVENTION:  Continue with DYS3 diet Discontinue prosource 30ml, BID Continue thiamin Continue MVI Continue FWF  every 4 hours ( per day) Initiate tube feeding via cortrak: Nocturnal feedings Glucerna 1.5 at 75 ml/h x 12 hrs(900 ml per day) Prosource TF20 60 ml BID   Provides 1510 kcal, 165 gm protein, 683 ml free water daily not including free water flush PEG placement.   NUTRITION DIAGNOSIS:   Moderate Malnutrition related to chronic illness as evidenced by meal completion < 25%, moderate fat depletion, moderate muscle depletion.    GOAL:   Patient will meet greater than or equal to 90% of their needs    MONITOR:   PO intake, Supplement acceptance, Weight trends  REASON FOR ASSESSMENT:   Consult Assessment of nutrition requirement/status  ASSESSMENT:   67 y.o. F, admitted with severe hypothyroidism. PMHX: DM-1 supposed to be on insulin pump, hypothyroidism and HTN, leukopenia.  Reported to have declined oral intake.  Patient is being followed related to poor oral intake and possible PEG to be placed.  Calorie count over weekend results below 11/22  Diner refused 11/23 Breakfast lunch 0% Dinner 10% peach cobbler, 100% of ice cream =149 kcal, 2 g pro 11/24 10% breakfast= 78 kcal, 2.2 g pro 100% lime soda=80kcal  Patients needs are not being met through oral intake and she still needs EN support. She would be good candidate for PEG placement.   NUTRITION - FOCUSED PHYSICAL EXAM:  Flowsheet Row Most Recent Value  Orbital Region Mild depletion  Upper Arm Region Mild depletion  Thoracic and Lumbar Region No depletion  Buccal Region Moderate depletion  Temple Region Moderate depletion  Clavicle and Acromion Bone Region Mild depletion  Scapular Bone Region Unable to assess  Dorsal Hand Unable to assess  Patellar Region Mild depletion   Anterior Thigh Region Mild depletion  Posterior Calf Region Mild depletion  Edema (RD Assessment) Mild  Hair Reviewed  Eyes Reviewed  Mouth Reviewed  Skin Reviewed  Nails Reviewed       Diet Order:   Diet Order             DIET DYS 3 Room service appropriate? Yes with Assist; Fluid consistency: Thin  Diet effective now                   EDUCATION NEEDS:   Not appropriate for education at this time  Skin:  Skin Assessment: Reviewed RN Assessment  Last BM:  05/09/23  Height:   Ht Readings from Last 1 Encounters:  04/23/23 5\' 3"  (1.6 m)    Weight:   Wt Readings from Last 1 Encounters:  05/15/23 66.9 kg    Ideal Body Weight:     BMI:  Body mass index is 26.13 kg/m.  Estimated Nutritional Needs:   Kcal:  2000-2300kcal  Protein:  95-110 g/day  Fluid:  1800-2200 ml/d    Jamelle Haring RDN, LDN Clinical Dietitian  RDN pager # available on Amion

## 2023-05-16 NOTE — Inpatient Diabetes Management (Signed)
Inpatient Diabetes Program Recommendations  AACE/ADA: New Consensus Statement on Inpatient Glycemic Control (2015)  Target Ranges:  Prepandial:   less than 140 mg/dL      Peak postprandial:   less than 180 mg/dL (1-2 hours)      Critically ill patients:  140 - 180 mg/dL   Lab Results  Component Value Date   GLUCAP 206 (H) 05/16/2023   HGBA1C 9.9 (H) 04/24/2023    Review of Glycemic Control  Latest Reference Range & Units 05/15/23 16:03 05/15/23 19:48 05/15/23 23:25 05/16/23 03:36 05/16/23 08:38  Glucose-Capillary 70 - 99 mg/dL 161 (H) 096 (H) 045 (H) 169 (H) 206 (H)  (H): Data is abnormally high Diabetes history: DM 2 Outpatient Diabetes medications: insulin pump with Humalog on the home med rec Current orders for Inpatient glycemic control:  Semglee 16 units Daily, Novolog 0-15 units Q4   Glucerna Tube Feeds 65 ml/hour from 1800-0600   Inpatient Diabetes Program Recommendations:   Consider increasing Semglee 18 units every day  Thanks, Lujean Rave, MSN, RNC-OB Diabetes Coordinator [phone] (8a-5p)

## 2023-05-16 NOTE — Progress Notes (Signed)
Physical Therapy Treatment Patient Details Name: Katherine Navarro MRN: [id number] DOB: [birth year] Today's Date: 05/16/2023   History of Present Illness Pt is a 67 y/o F presenting to ED on 11/1 with progressive sleepiness, CT head negative, Admitted for metabolic encephalopathy in setting of hypothyroidism with elevated TSH. PMH includes DM1, hypothyroidism, HTN, diabetic retinopathy    PT Comments  Pt with poor tolerance to treatment today. Pt required +2 total A for bed mobility today and was unable to stand due to lethargy, poor trunk control, and impaired cognition. No change in DC/DME recs at this time. PT will continue to follow.    If plan is discharge home, recommend the following: Assistance with cooking/housework;Assistance with feeding;Help with stairs or ramp for entrance;Direct supervision/assist for medications management;Direct supervision/assist for financial management;Assist for transportation;Supervision due to cognitive status;Two people to help with walking and/or transfers   Can travel by private vehicle     No  Equipment Recommendations  None recommended by PT    Recommendations for Other Services       Precautions / Restrictions Precautions Precautions: Fall Precaution Comments: cortrak Restrictions Weight Bearing Restrictions: No     Mobility  Bed Mobility Overal bed mobility: Needs Assistance Bed Mobility: Supine to Sit, Sit to Supine     Supine to sit: +2 for physical assistance, Total assist Sit to supine: +2 for physical assistance, Total assist   General bed mobility comments: Via helicopter method.    Transfers                   General transfer comment: Deferred due to poor trunk control.    Ambulation/Gait               General Gait Details: not yet appropriate   Stairs             Wheelchair Mobility     Tilt Bed    Modified Rankin (Stroke Patients Only)       Balance Overall balance assessment:  Needs assistance Sitting-balance support: No upper extremity supported, Feet supported Sitting balance-Leahy Scale: Zero Sitting balance - Comments: Heavy posterior and L lateral lean seated EOB Postural control: Left lateral lean, Posterior lean                                  Cognition Arousal: Lethargic Behavior During Therapy: Flat affect Overall Cognitive Status: No family/caregiver present to determine baseline cognitive functioning                                 General Comments: Pt eyes mostly closed during session however would respond to questions. Mostly responded yes or no.        Exercises General Exercises - Lower Extremity Long Arc Quad: AROM, Both, 5 reps, Seated    General Comments General comments (skin integrity, edema, etc.): VSS      Pertinent Vitals/Pain Pain Assessment Pain Assessment: No/denies pain    Home Living                          Prior Function            PT Goals (current goals can now be found in the care plan section) Progress towards PT goals: Goals updated    Frequency    Min 1X/week  PT Plan      Co-evaluation              AM-PAC PT "6 Clicks" Mobility   Outcome Measure  Help needed turning from your back to your side while in a flat bed without using bedrails?: Total Help needed moving from lying on your back to sitting on the side of a flat bed without using bedrails?: Total Help needed moving to and from a bed to a chair (including a wheelchair)?: Total Help needed standing up from a chair using your arms (e.g., wheelchair or bedside chair)?: Total Help needed to walk in hospital room?: Total Help needed climbing 3-5 steps with a railing? : Total 6 Click Score: 6    End of Session   Activity Tolerance: Patient limited by fatigue;Other (comment) (Impaired cognition) Patient left: in bed;with call bell/phone within reach;with bed alarm set Nurse Communication:  Mobility status PT Visit Diagnosis: Other abnormalities of gait and mobility (R26.89);Muscle weakness (generalized) (M62.81);Difficulty in walking, not elsewhere classified (R26.2)     Time: 1610-9604 PT Time Calculation (min) (ACUTE ONLY): 9 min  Charges:    $Therapeutic Activity: 8-22 mins PT General Charges $$ ACUTE PT VISIT: 1 Visit                     Katherine Navarro, PT, DPT Acute Rehab Services [phone]    Katherine Navarro 05/16/2023, 4:10 PM

## 2023-05-16 NOTE — TOC Progression Note (Addendum)
Transition of Care [facility]) - Progression Note    Patient Details  Name: Katherine Navarro MRN: [id number] Date of Birth: 06/29/1955  [facility]) CM/SW Contact  Mearl Latin, LCSW Phone Number: 05/16/2023, 8:36 AM  Clinical Narrative:    CSW continuing to follow. Cortrak in place. Spouse not at bedside and has not returned calls regarding SNF choice.   Expected Discharge Plan: Skilled Nursing Facility Barriers to Discharge: Continued Medical Work up, English as a second language teacher, SNF Pending bed offer  Expected Discharge Plan and Services In-house Referral: Clinical Social Work   Post Acute Care Choice: Skilled Nursing Facility Living arrangements for the past 2 months: Single Family Home                                       Social Determinants of Health (SDOH) Interventions SDOH Screenings   Food Insecurity: No Food Insecurity (04/25/2023)  Housing: Low Risk  (04/25/2023)  Transportation Needs: No Transportation Needs (04/25/2023)  Utilities: Not At Risk (04/25/2023)  Depression (PHQ2-9): Low Risk  (10/31/2019)  Tobacco Use: Low Risk  (04/22/2023)    Readmission Risk Interventions     No data to display

## 2023-05-17 ENCOUNTER — Inpatient Hospital Stay (HOSPITAL_COMMUNITY): Payer: Medicare HMO

## 2023-05-17 DIAGNOSIS — E440 Moderate protein-calorie malnutrition: Secondary | ICD-10-CM | POA: Diagnosis not present

## 2023-05-17 DIAGNOSIS — R4182 Altered mental status, unspecified: Secondary | ICD-10-CM | POA: Diagnosis not present

## 2023-05-17 DIAGNOSIS — E039 Hypothyroidism, unspecified: Secondary | ICD-10-CM | POA: Diagnosis not present

## 2023-05-17 LAB — BASIC METABOLIC PANEL
Anion gap: 8 (ref 5–15)
BUN: 39 mg/dL — ABNORMAL HIGH (ref 8–23)
CO2: 24 mmol/L (ref 22–32)
Calcium: 8.8 mg/dL — ABNORMAL LOW (ref 8.9–10.3)
Chloride: 107 mmol/L (ref 98–111)
Creatinine, Ser: 0.92 mg/dL (ref 0.44–1.00)
GFR, Estimated: >60 mL/min (ref >60)
Glucose, Bld: 182 mg/dL — ABNORMAL HIGH (ref 70–99)
Potassium: 3.8 mmol/L (ref 3.5–5.1)
Sodium: 139 mmol/L (ref 135–145)

## 2023-05-17 LAB — GLUCOSE, CAPILLARY
Glucose-Capillary: 139 mg/dL — ABNORMAL HIGH (ref 70–99)
Glucose-Capillary: 162 mg/dL — ABNORMAL HIGH (ref 70–99)
Glucose-Capillary: 167 mg/dL — ABNORMAL HIGH (ref 70–99)
Glucose-Capillary: 187 mg/dL — ABNORMAL HIGH (ref 70–99)
Glucose-Capillary: 219 mg/dL — ABNORMAL HIGH (ref 70–99)
Glucose-Capillary: 232 mg/dL — ABNORMAL HIGH (ref 70–99)

## 2023-05-17 NOTE — TOC Progression Note (Addendum)
Transition of Care [facility]) - Progression Note    Patient Details  Name: Katherine Navarro MRN: [id number] Date of Birth: 07/20/1955  [facility]) CM/SW Contact  Mearl Latin, LCSW Phone Number: 05/17/2023, 1:52 PM  Clinical Narrative:    CSW spoke with patient's spouse and he is requesting [facility] after completing his tours. CSW awaiting confirmation from [facility] to see if they can accept patient with a PEG tube potentially Friday.   [facility] reported they can accept patient with PEG. CSW will begin insurance process once PEG placed.    Expected Discharge Plan: Skilled Nursing Facility Barriers to Discharge: Continued Medical Work up, English as a second language teacher, SNF Pending bed offer  Expected Discharge Plan and Services In-house Referral: Clinical Social Work   Post Acute Care Choice: Skilled Nursing Facility Living arrangements for the past 2 months: Single Family Home                                       Social Determinants of Health (SDOH) Interventions SDOH Screenings   Food Insecurity: No Food Insecurity (04/25/2023)  Housing: Low Risk  (04/25/2023)  Transportation Needs: No Transportation Needs (04/25/2023)  Utilities: Not At Risk (04/25/2023)  Depression (PHQ2-9): Low Risk  (10/31/2019)  Tobacco Use: Low Risk  (04/22/2023)    Readmission Risk Interventions     No data to display

## 2023-05-17 NOTE — Plan of Care (Signed)
  Problem: Nutritional: Goal: Maintenance of adequate nutrition will improve 05/17/2023 2249 by Arnetha Courser, RN Outcome: Progressing 05/17/2023 2248 by Arnetha Courser, RN Outcome: Progressing   Problem: Cardiac: Goal: Ability to maintain an adequate cardiac output will improve 05/17/2023 2249 by Arnetha Courser, RN Outcome: Progressing 05/17/2023 2248 by Arnetha Courser, RN Outcome: Progressing   Problem: Respiratory: Goal: Will regain and/or maintain adequate ventilation 05/17/2023 2249 by Arnetha Courser, RN Outcome: Progressing 05/17/2023 2248 by Arnetha Courser, RN Outcome: Progressing   Problem: Clinical Measurements: Goal: Ability to maintain clinical measurements within normal limits will improve 05/17/2023 2249 by Arnetha Courser, RN Outcome: Progressing 05/17/2023 2248 by Arnetha Courser, RN Outcome: Progressing   Problem: Elimination: Goal: Will not experience complications related to bowel motility 05/17/2023 2249 by Arnetha Courser, RN Outcome: Progressing 05/17/2023 2248 by Arnetha Courser, RN Outcome: Progressing   Problem: Skin Integrity: Goal: Risk for impaired skin integrity will decrease 05/17/2023 2249 by Arnetha Courser, RN Outcome: Progressing 05/17/2023 2248 by Arnetha Courser, RN Outcome: Progressing

## 2023-05-17 NOTE — Progress Notes (Signed)
PROGRESS NOTE        PATIENT DETAILS Name: Katherine Navarro Age: 67 y.o. Sex: female Date of Birth: 1956-01-04 Admit Date: 04/22/2023 Admitting Physician Buena Irish, MD UJW:[id number], Lelon Mast, [place]  Brief Summary: Patient is a 67 y.o.  female with history of DM-1, HTN, hypothyroidism-who was brought to the ED for weakness/progressive sleepiness-ongoing for several weeks-found to have acute metabolic encephalopathy in the setting of severe hypothyroidism with myxedema and vitamin B1 deficiency.  Significant events: 11/1>> admit to [facility]  Significant studies: 11/1>> CT head: No acute intracranial pathology 11/1>> CXR: No PNA 11/1>> TSH: 44 11/1>> FT4:<0.25 11/2>> Spot EEG: No seizures 11/2>> A1c: 10.5 11/2>> vitamin B1:<20 11/2>> vitamin B12: 1992 11/2>> NH4: 23 11/2>> RPR: Nonreactive 11/9>> renal ultrasound: Increased echogenicity of renal parenchyma bilaterally 11/10>> folate: 2.4 11/11>> right upper extremity Doppler: No DVT 11/18>> MRI brain: No acute findings 11/16>> TSH: 0.110  Significant microbiology data: 11/1>> urine culture: No growth 11/2>> blood culture: No growth  Procedures: 11/13>>Cortak tube  Consults: Neurology  Subjective: Awake-talking to me this morning-oral intake apparently continues to be erratic and mostly poor.  Objective: Vitals: Blood pressure (!) 125/59, pulse 76, temperature 98.7 F (37.1 C), temperature source Axillary, resp. rate 15, height 5\' 3"  (1.6 m), weight 68.2 kg, last menstrual period 05/03/2013, SpO2 98%.   Exam: Awake/alert Not in any distress Chest: Clear to auscultation Abdomen: Soft nontender nondistended Neurology: Has generalized weakness but nonfocal.  Pertinent Labs/Radiology:    Latest Ref Rng & Units 05/16/2023    4:46 AM 05/11/2023    4:28 AM 05/10/2023    5:15 AM  CBC  WBC 4.0 - 10.5 K/uL 12.0  7.4  8.3   Hemoglobin 12.0 - 15.0 g/dL 9.1  9.5  82.9   Hematocrit 36.0 - 46.0  % 29.0  28.8  31.5   Platelets 150 - 400 K/uL 587  371  324     Lab Results  Component Value Date   NA 139 05/17/2023   K 3.8 05/17/2023   CL 107 05/17/2023   CO2 24 05/17/2023      Assessment/Plan: Acute metabolic encephalopathy Thought to be due to severe hypothyroidism with myxedema features and Wernicke's encephalopathy. Continue to treat underlying etiologies Mental status slowly improving but continues to have lethargy/excessive sleepiness Per neurology-encephalopathy can take months/weeks to resolve in the setting..  Severe hypothyroidism with myxedema Per H&P-noncompliant with levothyroxine-likely etiology for severe hypothyroidism. Continue levothyroxine-dosage adjusted 11/19 Will repeat TSH tomorrow  Wernicke's encephalopathy Received several doses of high-dose IV thiamine Currently on oral thiamine supplementation  AKI on CKD stage IIIa AKI hemodynamically mediated- Renal function back to baseline with supportive care  Acute urinary retention Managed with Foley/Flomax Since a bit more awake today-will attempt to do a voiding trial-discontinue Foley today  Hypernatremia Resolved with free water flushes.  Hypokalemia Repleted  Failure to thrive syndrome Oropharyngeal dysphagia due to metabolic encephalopathy Waxing/waning encephalopathy continues to be the limiting factor for oral intake Has a cortrak tube in place since 11/13-calorie count with inadequate oral intake Extensive discussion with the husband over the past several days-since encephalopathy gradually improving-and per neurology-this could take several months-he is okay to proceed with a PEG tube placement.  IR following-will await further recommendations regarding timing of PEG tube. Continue tube feeds via cortrack tube in the interim.  Normocytic anemia Secondary to acute/critical illness  Required 1 unit of PRBC on 11/11-hemoglobin stable since then Follow CBC periodically  Right Upper  extremity hematoma Supportive care  HTN All antihypertensives on hold as BP stable without the use of any medications  DM-1 Previously on insulin pump CBGs relatively stable Continue Semglee 16 units daily/SSI Reassess 11/26  Recent Labs    05/17/23 0002 05/17/23 0349 05/17/23 0750  GLUCAP 162* 187* 219*    Nutrition Status: Nutrition Problem: Moderate Malnutrition Etiology: chronic illness Signs/Symptoms: meal completion < 25%, moderate fat depletion, moderate muscle depletion Interventions: Ensure Enlive (each supplement provides 350kcal and 20 grams of protein), MVI, Magic cup  BMI: Estimated body mass index is 26.63 kg/m as calculated from the following:   Height as of this encounter: 5\' 3"  (1.6 m).   Weight as of this encounter: 68.2 kg.   Code status:   Code Status: Full Code   DVT Prophylaxis: enoxaparin (LOVENOX) injection 40 mg Start: 05/07/23 1245 Place and maintain sequential compression device Start: 05/03/23 1035   Family Communication: Spouse-[name]-[phone] updated on 11/25.   Disposition Plan: Status is: Inpatient Remains inpatient appropriate because: Severity of illness   Planned Discharge Destination:Skilled nursing facility   Diet: Diet Order             DIET DYS 3 Room service appropriate? Yes with Assist; Fluid consistency: Thin  Diet effective now                     Antimicrobial agents: Anti-infectives (From admission, onward)    None        MEDICATIONS: Scheduled Meds:  Chlorhexidine Gluconate Cloth  6 each Topical Q0600   docusate sodium  100 mg Oral BID   enoxaparin (LOVENOX) injection  40 mg Subcutaneous Q24H   famotidine  20 mg Oral Daily   feeding supplement (PROSource TF20)  60 mL Per Tube BID   folic acid  2 mg Oral Daily   free water  400 mL Per Tube Q4H   insulin aspart  0-15 Units Subcutaneous Q4H   insulin glargine-yfgn  16 Units Subcutaneous Daily   lactose free nutrition  237 mL Oral TID WC    levothyroxine  75 mcg Oral Q0600   multivitamin  1 tablet Oral Daily   mouth rinse  15 mL Mouth Rinse 4 times per day   polyethylene glycol  17 g Oral BID   tamsulosin  0.4 mg Oral Daily   thiamine  100 mg Oral Daily   Continuous Infusions:  feeding supplement (GLUCERNA 1.5 CAL) Stopped (05/17/23 0606)   PRN Meds:.acetaminophen **OR** acetaminophen, dextrose, [DISCONTINUED] ondansetron **OR** ondansetron (ZOFRAN) IV, mouth rinse, sorbitol   I have personally reviewed following labs and imaging studies  LABORATORY DATA: CBC: Recent Labs  Lab 05/11/23 0428 05/16/23 0446  WBC 7.4 12.0*  HGB 9.5* 9.1*  HCT 28.8* 29.0*  MCV 88.9 90.6  PLT 371 587*    Basic Metabolic Panel: Recent Labs  Lab 05/13/23 0352 05/14/23 0353 05/15/23 0240 05/16/23 0446 05/17/23 0101  NA 148* 148* 145 137 139  K 3.9 3.5 3.8 4.1 3.8  CL 114* 112* 110 107 107  CO2 24 27 24 25 24   GLUCOSE 402* 278* 205* 198* 182*  BUN 47* 46* 46* 40* 39*  CREATININE 1.06* 1.03* 0.92 0.79 0.92  CALCIUM 9.6 9.1 9.4 8.3* 8.8*    GFR: Estimated Creatinine Clearance: 56.5 mL/min (by C-G formula based on SCr of 0.92 mg/dL).  Liver Function Tests: No results for input(s): "AST", "  ALT", "ALKPHOS", "BILITOT", "PROT", "ALBUMIN" in the last 168 hours. No results for input(s): "LIPASE", "AMYLASE" in the last 168 hours. Recent Labs  Lab 05/16/23 0446  AMMONIA 26    Coagulation Profile: No results for input(s): "INR", "PROTIME" in the last 168 hours.  Cardiac Enzymes: No results for input(s): "CKTOTAL", "CKMB", "CKMBINDEX", "TROPONINI" in the last 168 hours.  BNP (last 3 results) No results for input(s): "PROBNP" in the last 8760 hours.  Lipid Profile: No results for input(s): "CHOL", "HDL", "LDLCALC", "TRIG", "CHOLHDL", "LDLDIRECT" in the last 72 hours.  Thyroid Function Tests: No results for input(s): "TSH", "T4TOTAL", "FREET4", "T3FREE", "THYROIDAB" in the last 72 hours.  Anemia Panel: No results for  input(s): "VITAMINB12", "FOLATE", "FERRITIN", "TIBC", "IRON", "RETICCTPCT" in the last 72 hours.   Urine analysis:    Component Value Date/Time   COLORURINE YELLOW 04/22/2023 1626   APPEARANCEUR CLEAR 04/22/2023 1626   LABSPEC 1.015 04/22/2023 1626   PHURINE 6.0 04/22/2023 1626   GLUCOSEU 50 (A) 04/22/2023 1626   GLUCOSEU >=1000 (A) 12/16/2017 1110   HGBUR NEGATIVE 04/22/2023 1626   HGBUR moderate 02/01/2008 0902   BILIRUBINUR NEGATIVE 04/22/2023 1626   KETONESUR 5 (A) 04/22/2023 1626   PROTEINUR NEGATIVE 04/22/2023 1626   UROBILINOGEN 0.2 12/16/2017 1110   NITRITE NEGATIVE 04/22/2023 1626   LEUKOCYTESUR NEGATIVE 04/22/2023 1626    Sepsis Labs: Lactic Acid, Venous    Component Value Date/Time   LATICACIDVEN 2.1 (HH) 04/22/2023 2039    MICROBIOLOGY: No results found for this or any previous visit (from the past 240 hour(s)).  RADIOLOGY STUDIES/RESULTS: No results found.   LOS: 25 days   Jeoffrey Massed, MD  [facility]    To contact the attending provider between 7A-7P or the covering provider during after hours 7P-7A, please log into the web site [email or web address] and access using universal [place] password for that web site. If you do not have the password, please call the hospital operator.  05/17/2023, 9:20 AM

## 2023-05-17 NOTE — Progress Notes (Signed)
Occupational Therapy Treatment Patient Details Name: Katherine Navarro MRN: [id number] DOB: 11/16/1955 Today's Date: 05/17/2023   History of present illness Pt is a 67 y/o F presenting to ED on 11/1 with progressive sleepiness, CT head negative, Admitted for metabolic encephalopathy in setting of hypothyroidism with elevated TSH. PMH includes DM1, hypothyroidism, HTN, diabetic retinopathy   OT comments  This patient he most alert and interactive today than I have seen her. Following 50% of one step commands with increased time at bed level and answering some questions for me. She will continue to benefit from acute OT with follow up from continued inpatient follow up therapy, <3 hours/day.        If plan is discharge home, recommend the following:  Two people to help with walking and/or transfers;Assistance with cooking/housework;Assistance with feeding;Help with stairs or ramp for entrance;Assist for transportation;Direct supervision/assist for medications management;Two people to help with bathing/dressing/bathroom;Direct supervision/assist for financial management   Equipment Recommendations  Other (comment) (TBD next venue)       Precautions / Restrictions Precautions Precautions: Fall Precaution Comments: cortrak Restrictions Weight Bearing Restrictions: No       Mobility Bed Mobility               General bed mobility comments: Pt able to hold onto my hands and pull forward off of bed with Max A             Extremity/Trunk Assessment Upper Extremity Assessment Upper Extremity Assessment: RUE deficits/detail;LUE deficits/detail RUE Deficits / Details: moving arms and hands on command today. Very emaciated hands with strength throughout of 2/5 RUE Coordination: decreased fine motor;decreased gross motor            Vision Baseline Vision/History: 2 Legally blind Additional Comments: left eye blind per chart, pt also with h/o diabetic retinopathy and  glaucoma          Cognition Arousal:  (eyes shut 1/4 session, but talking with me during that time) Behavior During Therapy: Flat affect Overall Cognitive Status: No family/caregiver present to determine baseline cognitive functioning Area of Impairment: Orientation                 Orientation Level: Disoriented to, Time             General Comments: Said "hello" when I said hello. Asked how she was and she said "I am good, how are you?". Followed 50% of one step commands with moving Bil UEs. Able to tell me November and she was at [facility]. For year she said 3 and why she was here she said "I just wasn't feeling well"                   Pertinent Vitals/ Pain       Pain Assessment Pain Assessment: Faces Faces Pain Scale: No hurt Pain Location: with minimal PROM left fingers Pain Descriptors / Indicators: Grimacing, Guarding (resisting) Pain Intervention(s): Limited activity within patient's tolerance, Repositioned (removed palm guard)         Frequency  Min 1X/week        Progress Toward Goals  OT Goals(current goals can now be found in the care plan section)  Progress towards OT goals: Progressing toward goals  Acute Rehab OT Goals Patient Stated Goal: unable OT Goal Formulation: Patient unable to participate in goal setting Time For Goal Achievement: 05/19/23 Potential to Achieve Goals: Fair         AM-PAC OT "6 Clicks" Daily Activity  Outcome Measure   Help from another person eating meals?: Total Help from another person taking care of personal grooming?: Total Help from another person toileting, which includes using toliet, bedpan, or urinal?: Total Help from another person bathing (including washing, rinsing, drying)?: Total Help from another person to put on and taking off regular upper body clothing?: Total Help from another person to put on and taking off regular lower body clothing?: Total 6 Click Score: 6    End of Session     OT Visit Diagnosis: Muscle weakness (generalized) (M62.81);Low vision, both eyes (H54.2);Other symptoms and signs involving cognitive function   Activity Tolerance Patient tolerated treatment well   Patient Left in bed;with call bell/phone within reach;with bed alarm set           Time: 1610-9604 OT Time Calculation (min): 12 min  Charges: OT General Charges $OT Visit: 1 Visit OT Treatments $Therapeutic Activity: 8-22 mins  Lindon Romp OT Acute Rehabilitation Services Office [phone]    Evette Georges 05/17/2023, 5:12 PM

## 2023-05-18 ENCOUNTER — Encounter (HOSPITAL_COMMUNITY): Payer: Self-pay | Admitting: Internal Medicine

## 2023-05-18 ENCOUNTER — Inpatient Hospital Stay (HOSPITAL_COMMUNITY): Payer: Medicare HMO

## 2023-05-18 DIAGNOSIS — E440 Moderate protein-calorie malnutrition: Secondary | ICD-10-CM | POA: Diagnosis not present

## 2023-05-18 DIAGNOSIS — R4182 Altered mental status, unspecified: Secondary | ICD-10-CM | POA: Diagnosis not present

## 2023-05-18 DIAGNOSIS — E039 Hypothyroidism, unspecified: Secondary | ICD-10-CM | POA: Diagnosis not present

## 2023-05-18 HISTORY — PX: IR GASTROSTOMY TUBE MOD SED: IMG625

## 2023-05-18 LAB — CBC
HCT: 26.8 % — ABNORMAL LOW (ref 36.0–46.0)
Hemoglobin: 8.6 g/dL — ABNORMAL LOW (ref 12.0–15.0)
MCH: 28.6 pg (ref 26.0–34.0)
MCHC: 32.1 g/dL (ref 30.0–36.0)
MCV: 89 fL (ref 80.0–100.0)
Platelets: 483 10*3/uL — ABNORMAL HIGH (ref 150–400)
RBC: 3.01 MIL/uL — ABNORMAL LOW (ref 3.87–5.11)
RDW: 16.1 % — ABNORMAL HIGH (ref 11.5–15.5)
WBC: 8.5 10*3/uL (ref 4.0–10.5)
nRBC: 0.4 % — ABNORMAL HIGH (ref 0.0–0.2)

## 2023-05-18 LAB — GLUCOSE, CAPILLARY
Glucose-Capillary: 118 mg/dL — ABNORMAL HIGH (ref 70–99)
Glucose-Capillary: 127 mg/dL — ABNORMAL HIGH (ref 70–99)
Glucose-Capillary: 147 mg/dL — ABNORMAL HIGH (ref 70–99)
Glucose-Capillary: 157 mg/dL — ABNORMAL HIGH (ref 70–99)
Glucose-Capillary: 165 mg/dL — ABNORMAL HIGH (ref 70–99)
Glucose-Capillary: 175 mg/dL — ABNORMAL HIGH (ref 70–99)
Glucose-Capillary: 235 mg/dL — ABNORMAL HIGH (ref 70–99)

## 2023-05-18 LAB — TSH: TSH: 7.684 u[IU]/mL — ABNORMAL HIGH (ref 0.350–4.500)

## 2023-05-18 LAB — PROTIME-INR
INR: 0.9 (ref 0.8–1.2)
Prothrombin Time: 12.8 s (ref 11.4–15.2)

## 2023-05-18 MED ORDER — DOCUSATE SODIUM 50 MG/5ML PO LIQD
100.0000 mg | Freq: Two times a day (BID) | ORAL | Status: DC
  Filled 2023-05-18: qty 10

## 2023-05-18 MED ORDER — TRIPLE ANTIBIOTIC 3.5-400-5000 EX OINT
1.0000 | TOPICAL_OINTMENT | Freq: Every day | CUTANEOUS | Status: DC
  Administered 2023-05-18 – 2023-05-20 (×3): 1 via TOPICAL
  Filled 2023-05-18 (×3): qty 1

## 2023-05-18 MED ORDER — GLUCAGON HCL RDNA (DIAGNOSTIC) 1 MG IJ SOLR
INTRAMUSCULAR | Status: AC
  Filled 2023-05-18: qty 1

## 2023-05-18 MED ORDER — MIDAZOLAM HCL 2 MG/2ML IJ SOLN
INTRAMUSCULAR | Status: AC
Start: 2023-05-18 — End: ?
  Filled 2023-05-18: qty 2

## 2023-05-18 MED ORDER — VANCOMYCIN HCL IN DEXTROSE 1-5 GM/200ML-% IV SOLN
INTRAVENOUS | Status: AC
  Filled 2023-05-18: qty 200

## 2023-05-18 MED ORDER — VANCOMYCIN HCL IN DEXTROSE 1-5 GM/200ML-% IV SOLN: 1000.0000 mg | Freq: Once | INTRAVENOUS | Status: DC

## 2023-05-18 MED ORDER — MIDAZOLAM HCL 2 MG/2ML IJ SOLN
INTRAMUSCULAR | Status: AC | PRN
  Administered 2023-05-18: .5 mg via INTRAVENOUS

## 2023-05-18 MED ORDER — FENTANYL CITRATE (PF) 100 MCG/2ML IJ SOLN
INTRAMUSCULAR | Status: AC | PRN
Start: 2023-05-18 — End: 2023-05-18
  Administered 2023-05-18 (×2): 25 ug via INTRAVENOUS

## 2023-05-18 MED ORDER — FENTANYL CITRATE (PF) 100 MCG/2ML IJ SOLN
INTRAMUSCULAR | Status: AC
  Filled 2023-05-18: qty 2

## 2023-05-18 MED ORDER — LIDOCAINE HCL 1 % IJ SOLN
20.0000 mL | Freq: Once | INTRAMUSCULAR | Status: AC
  Administered 2023-05-18: 10 mL

## 2023-05-18 MED ORDER — ENOXAPARIN SODIUM 40 MG/0.4ML IJ SOSY
40.0000 mg | PREFILLED_SYRINGE | INTRAMUSCULAR | Status: DC
  Administered 2023-05-19 – 2023-05-20 (×2): 40 mg via SUBCUTANEOUS
  Filled 2023-05-18 (×2): qty 0.4

## 2023-05-18 MED ORDER — IOHEXOL 300 MG/ML  SOLN
50.0000 mL | Freq: Once | INTRAMUSCULAR | Status: AC | PRN
  Administered 2023-05-18: 20 mL

## 2023-05-18 MED ORDER — LIDOCAINE HCL 1 % IJ SOLN
INTRAMUSCULAR | Status: AC
  Filled 2023-05-18: qty 20

## 2023-05-18 NOTE — TOC Progression Note (Signed)
Transition of Care [facility]) - Progression Note    Patient Details  Name: Katherine Navarro MRN: [id number] Date of Birth: August 07, 1955  [facility] CM/SW Contact  Mearl Latin, LCSW Phone Number: 05/18/2023, 2:27 PM  Clinical Narrative:    CSW sent [facility] tube feed formula info. CSW initiated insurance process and received approval, [id number], effective 05/19/2023-05/24/2023. [facility] will accept patient on Friday. CSW left voicemail for spouse to update him.    Expected Discharge Plan: Skilled Nursing Facility Barriers to Discharge: Continued Medical Work up, English as a second language teacher, SNF Pending bed offer  Expected Discharge Plan and Services In-house Referral: Clinical Social Work   Post Acute Care Choice: Skilled Nursing Facility Living arrangements for the past 2 months: Single Family Home                                       Social Determinants of Health (SDOH) Interventions SDOH Screenings   Food Insecurity: No Food Insecurity (04/25/2023)  Housing: Low Risk  (04/25/2023)  Transportation Needs: No Transportation Needs (04/25/2023)  Utilities: Not At Risk (04/25/2023)  Depression (PHQ2-9): Low Risk  (10/31/2019)  Tobacco Use: Low Risk  (05/18/2023)    Readmission Risk Interventions     No data to display

## 2023-05-18 NOTE — Progress Notes (Signed)
   IR Note  Risks and benefits image guided gastrostomy tube placement was discussed with the patient's husband [name] via phone including, but not limited to the need for a barium enema during the procedure, bleeding, infection, peritonitis and/or damage to adjacent structures.  All questions were answered, Katherine Navarro is agreeable to proceed.  Consent signed and in chart.

## 2023-05-18 NOTE — Procedures (Signed)
Interventional Radiology Procedure Note  Procedure: Placement of percutaneous 16F pull-through gastrostomy tube. Complications: None Recommendations: - OK to use in 4 hours - ICE prn and other analgesic prn - routine wound care  Signed,   Yvone Neu. Loreta Ave, DO

## 2023-05-18 NOTE — Consult Note (Signed)
Chief Complaint: Patient was seen in consultation today for percutaneous gastric tube placement Chief Complaint  Patient presents with   Weakness   at the request of Dr Windell Norfolk   Supervising Physician: Gilmer Mor  Patient Status: [facility] - In-pt  History of Present Illness: Katherine Navarro is a 67 y.o. female   FULL Code status per chart  Hx DM; HTN To ED 11/1 with weakness and  progressive sleepiness/somnolence X weeks Work up revealing metabolic encephalopathy; severe hypothyroidism and Vit B1 deficiency Dx: Wernicke's encephalopathy Failure to thrive Poor po intake Dysphagia Calorie count inadequate even with Cortrak  Requesting percutaneous gastric tube placement Imaging has been reviewed and approved with Dr Elby Showers Planned for possible today in IR     Past Medical History:  Diagnosis Date   Complication of anesthesia    trouble waking up   CONSTIPATION, CHRONIC 12/13/2007   DIABETES MELLITUS, TYPE I 01/07/2007   DM nephropathy/sclerosis    GLAUCOMA 12/13/2007   HYPERCHOLESTEROLEMIA 12/13/2007   Hypertension    Hyperthyroidism    INTERNAL HEMORRHOIDS 07/23/2008   LEUKOPENIA, CHRONIC 12/13/2007   Nonproliferative diabetic retinopathy NOS(362.03) 12/13/2007   Unspecified hypothyroidism 12/13/2007   VARICOSE VEINS, LOWER EXTREMITIES 12/13/2007    Past Surgical History:  Procedure Laterality Date   ANTERIOR CERVICAL DECOMP/DISCECTOMY FUSION N/A 02/06/2020   Procedure: Anterior Cervical Decompression Discectomy Fusion Cervical five-six;  Surgeon: Donalee Citrin, MD;  Location: [facility], In OR;  Service: Neurosurgery;  Laterality: N/A;   ELECTROCARDIOGRAM  08/24/2006   EYE SURGERY     bilateral   LEEP N/A 12/01/2012   Procedure: LOOP ELECTROSURGICAL EXCISION PROCEDURE (LEEP);  Surgeon: Purcell Nails, MD;  Location: [facility] ORS;  Service: Gynecology;  Laterality: N/A;   Stress Myoview   05/18/2004   TUBAL LIGATION      Allergies: Atorvastatin, Ciprofloxacin, Epinephrine,  Erythromycin, Morphine, Peanut-containing drug products, and Penicillins  Medications: Prior to Admission medications   Medication Sig Start Date End Date Taking? Authorizing Provider  Accu-Chek Softclix Lancets lancets Use as instructed 06/23/21   Romero Belling, MD  acetaZOLAMIDE ER (DIAMOX) 500 MG capsule Take 500 mg by mouth 2 (two) times daily. Patient not taking: Reported on 04/24/2023    [provider]  amiloride-hydrochlorothiazide (MODURETIC) 5-50 MG tablet Take 0.5 tablets by mouth daily. Patient not taking: Reported on 04/24/2023 10/31/19 02/01/20  Jarold Motto, PA  brimonidine (ALPHAGAN P) 0.1 % SOLN Place 1 drop into both eyes daily as needed (red/irritated eyes). Patient not taking: Reported on 04/24/2023    [provider]  glucose blood (ACCU-CHEK GUIDE) test strip 1 each by Other route See admin instructions. Check 5 times per day, and lancets 5/day Patient not taking: Reported on 04/24/2023 03/19/21   Romero Belling, MD  Insulin Infusion Pump Supplies (MINIMED INFUSION SET-MMT 396) MISC 1 Device by Other route every other day. Patient not taking: Reported on 04/24/2023 03/19/21   Romero Belling, MD  Insulin Infusion Pump Supplies (PARADIGM RESERVOIR ) MISC 1 Device by Other route every other day. Patient not taking: Reported on 04/24/2023 12/18/20   Romero Belling, MD  insulin lispro (HUMALOG) 100 UNIT/ML injection Via pump, total of 50 units per day Patient not taking: Reported on 04/24/2023 12/18/20   Romero Belling, MD  levothyroxine (SYNTHROID) 112 MCG tablet Take 1 tablet (112 mcg total) by mouth daily before breakfast. Patient not taking: Reported on 04/24/2023 03/19/21   Romero Belling, MD     Family History  Problem Relation Age of Onset  Cancer Mother        Breast Cancer   Diabetes Mother    Breast cancer Mother    Diabetes Father     Social History   Socioeconomic History   Marital status: Married    Spouse name: Not on file   Number of children:  Not on file   Years of education: Not on file   Highest education level: Not on file  Occupational History    Comment: Doesn't work outside the home  Tobacco Use   Smoking status: Never   Smokeless tobacco: Never  Vaping Use   Vaping status: Never Used  Substance and Sexual Activity   Alcohol use: Yes    Alcohol/week: 1.0 standard drink of alcohol    Types: 1 Glasses of wine per week    Comment: occasional   Drug use: No   Sexual activity: Not on file  Other Topics Concern   Not on file  Social History Narrative   Pt gets regular exercise.         Social Determinants of Health   Financial Resource Strain: Not on file  Food Insecurity: No Food Insecurity (04/25/2023)   Hunger Vital Sign    Worried About Running Out of Food in the Last Year: Never true    Ran Out of Food in the Last Year: Never true  Transportation Needs: No Transportation Needs (04/25/2023)   PRAPARE - Administrator, Civil Service (Medical): No    Lack of Transportation (Non-Medical): No  Physical Activity: Not on file  Stress: Not on file  Social Connections: Not on file    Review of Systems: A 12 point ROS discussed and pertinent positives are indicated in the HPI above.  All other systems are negative.  Review of Systems  Constitutional:  Positive for activity change, appetite change and fatigue.  Respiratory:  Negative for cough and shortness of breath.   Cardiovascular:  Negative for chest pain.  Gastrointestinal:  Negative for abdominal pain.  Psychiatric/Behavioral:  Positive for confusion and decreased concentration. Negative for agitation.     Vital Signs: BP 121/70 (BP Location: Left Arm)   Pulse 86   Temp 98.2 F (36.8 C) (Oral)   Resp 10   Ht 5\' 3"  (1.6 m)   Wt 158 lb 8.2 oz (71.9 kg)   LMP 05/03/2013   SpO2 96%   BMI 28.08 kg/m     Physical Exam Vitals reviewed.  Constitutional:      General: She is not in acute distress. HENT:     Mouth/Throat:     Mouth:  Mucous membranes are moist.  Cardiovascular:     Rate and Rhythm: Normal rate and regular rhythm.     Heart sounds: Normal heart sounds.  Pulmonary:     Effort: Pulmonary effort is normal.     Breath sounds: Normal breath sounds. No wheezing.  Abdominal:     Palpations: Abdomen is soft.  Skin:    General: Skin is warm.  Neurological:     Comments: No response   Psychiatric:     Comments: Will contact family for consent     Imaging: CT ABDOMEN WO CONTRAST  Result Date: 05/17/2023 CLINICAL DATA:  67 year old female with history of dysphagia, evaluate for gastrostomy tube placement. EXAM: CT ABDOMEN WITHOUT CONTRAST TECHNIQUE: Multidetector CT imaging of the abdomen was performed following the standard protocol without IV contrast. RADIATION DOSE REDUCTION: This exam was performed according to the departmental dose-optimization program which includes  automated exposure control, adjustment of the mA and/or kV according to patient size and/or use of iterative reconstruction technique. COMPARISON:  None Available. FINDINGS: Lower chest: Right greater than left mild bibasilar subsegmental atelectasis. The heart is normal in size. Hepatobiliary: The liver is normal in size, contour, and attenuation. No focal mass. The gallbladder is present and distended with layering sludge. No intra or extrahepatic biliary ductal dilation. Pancreas: Unremarkable. No pancreatic ductal dilatation or surrounding inflammatory changes. Spleen: Normal in size without focal abnormality. Adrenals/Urinary Tract: Adrenal glands are unremarkable. Kidneys are normal, without renal calculi, focal lesion, or hydronephrosis. Stomach/Bowel: Stomach is within normal limits. Enteric feeding tube in place with the distal tip in the gastric antrum. No evidence of bowel wall thickening, distention, or inflammatory changes. Vascular/Lymphatic: Aortic atherosclerosis. No enlarged abdominal lymph nodes. Other: No abdominal wall hernia or  abnormality. Musculoskeletal: No acute or significant osseous findings. IMPRESSION: 1. Anatomy is amenable to image guided percutaneous gastrostomy tube placement. 2. Bibasilar subsegmental atelectasis. Marliss Coots, MD [facility] Electronically Signed   By: Marliss Coots M.D.   On: 05/17/2023 14:01   MR BRAIN WO CONTRAST  Result Date: 05/09/2023 CLINICAL DATA:  Altered mental status EXAM: MRI HEAD WITHOUT CONTRAST TECHNIQUE: Multiplanar, multiecho pulse sequences of the brain and surrounding structures were obtained without intravenous contrast. COMPARISON:  Head CT 04/22/2023 FINDINGS: Brain: Diffusion imaging does not show any acute or subacute infarction or other cause of restricted diffusion. No focal abnormality affects the brainstem or cerebellum. Cerebral hemispheres show generalized volume loss. There are advanced chronic small-vessel ischemic changes of the white matter. No cortical or large vessel territory infarction. No mass lesion, hemorrhage, hydrocephalus or extra-axial collection. Vascular: Major vessels at the base of the brain show flow. Skull and upper cervical spine: Negative Sinuses/Orbits: Sinuses are clear.  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 Other: None IMPRESSION: No acute or reversible finding. Generalized volume loss. Advanced chronic small-vessel ischemic changes of the cerebral hemispheric white matter. Electronically Signed   By: Paulina Fusi M.D.   On: 05/09/2023 16:37   DG Abd Portable 1V  Result Date: 05/04/2023 CLINICAL DATA:  Feeding tube placement. EXAM: PORTABLE ABDOMEN - 1 VIEW COMPARISON:  Chest radiograph dated 05/03/2023. FINDINGS: Feeding tube with tip in the right upper abdomen likely in the distal stomach. IMPRESSION: Feeding tube with tip in the distal stomach. Electronically Signed   By: Elgie Collard M.D.   On: 05/04/2023 17:25   DG Chest Port 1 View  Result Date: 05/03/2023 CLINICAL DATA:   Shortness of breath. EXAM: PORTABLE CHEST 1 VIEW COMPARISON:  04/22/2023 FINDINGS: Slightly prominent vascular markings in the retrocardiac space. No focal lung disease. Heart and mediastinum are within normal limits. Trachea is midline. No acute bone abnormality. Negative for a pneumothorax. IMPRESSION: No acute cardiopulmonary disease. Electronically Signed   By: Richarda Overlie M.D.   On: 05/03/2023 08:13   VAS [place] UPPER EXTREMITY VENOUS DUPLEX  Result Date: 05/02/2023 UPPER VENOUS STUDY  Patient Name:  Katherine Navarro  Date of Exam:   05/02/2023 Medical Rec #: [id number]         Accession #:    [id number] Date of Birth: 26-Feb-1956         Patient Gender: F Patient Age:   77 years Exam Location:  [facility] Procedure:      VAS [place] UPPER EXTREMITY VENOUS DUPLEX Referring Phys: Bess Harvest [facility] --------------------------------------------------------------------------------  Indications: Swelling, and bruising Anticoagulation: Patient could not externally  extend or rotate arm to be properly evaluated. Limitations: Body habitus, poor ultrasound/tissue interface and rigid. Comparison Study: No prior exam. Performing Technologist: Fernande Bras  Examination Guidelines: A complete evaluation includes B-mode imaging, spectral Doppler, color Doppler, and power Doppler as needed of all accessible portions of each vessel. Bilateral testing is considered an integral part of a complete examination. Limited examinations for reoccurring indications may be performed as noted.  Right Findings: +----------+------------+---------+-----------+----------+-------+ RIGHT     CompressiblePhasicitySpontaneousPropertiesSummary +----------+------------+---------+-----------+----------+-------+ IJV           Full       Yes       Yes                      +----------+------------+---------+-----------+----------+-------+ Subclavian    Full       Yes       Yes                       +----------+------------+---------+-----------+----------+-------+ Axillary      Full       Yes       Yes                      +----------+------------+---------+-----------+----------+-------+ Brachial      Full                                          +----------+------------+---------+-----------+----------+-------+ Cephalic      Full                                          +----------+------------+---------+-----------+----------+-------+ Basilic       Full                                          +----------+------------+---------+-----------+----------+-------+  Left Findings: +----------+------------+---------+-----------+----------+-------+ LEFT      CompressiblePhasicitySpontaneousPropertiesSummary +----------+------------+---------+-----------+----------+-------+ Subclavian               Yes       Yes                      +----------+------------+---------+-----------+----------+-------+  Summary:  Right: No evidence of deep vein thrombosis in the upper extremity. No evidence of superficial vein thrombosis in the upper extremity.  Left: No evidence of thrombosis in the subclavian.  *See table(s) above for measurements and observations.  Diagnosing physician: Gerarda Fraction Electronically signed by Gerarda Fraction on 05/02/2023 at 6:13:29 PM.    Final    US RENAL  Result Date: 04/30/2023 CLINICAL DATA:  Acute kidney injury. EXAM: RENAL / URINARY TRACT ULTRASOUND COMPLETE COMPARISON:  None Available. FINDINGS: Right Kidney: Renal measurements: 9.3 x 4.9 x 4.6 cm = volume: 109 mL. Increased echogenicity of renal parenchyma is noted. No mass or hydronephrosis visualized. Left Kidney: Renal measurements: 9.6 x 5.5 x 6.6 cm = volume: 181 mL. Increased echogenicity of renal parenchyma is noted. No mass or hydronephrosis visualized. Bladder: Moderate amount of mildly echogenic material is noted dependently in urinary bladder suggesting debris. Other: None. IMPRESSION:  Increased echogenicity of renal parenchyma is noted bilaterally suggesting medical renal disease. No hydronephrosis or renal obstruction is noted. Moderate amount  of mildly echogenic material noted dependently and urinary bladder concerning for debris, potentially inflammatory in etiology. Electronically Signed   By: Lupita Raider M.D.   On: 04/30/2023 11:41   EEG adult  Result Date: 04/23/2023 Charlsie Quest, MD     04/23/2023 12:34 PM Patient Name: Katherine Navarro MRN: [id number] Epilepsy Attending: Charlsie Quest Referring Physician/Provider: Almon Hercules, MD Date:04/23/2023 Duration: 24.50 mins Patient history: 67yo F with ams getting eeg to evaluate for seizure Level of alertness: Awake AEDs during EEG study: None Technical aspects: This EEG study was done with scalp electrodes positioned according to the 10-20 International system of electrode placement. Electrical activity was reviewed with band pass filter of 1-70Hz , sensitivity of 7 uV/mm, display speed of 80mm/sec with a 60Hz  notched filter applied as appropriate. EEG data were recorded continuously and digitally stored.  Video monitoring was available and reviewed as appropriate. Description: The posterior dominant rhythm consists of 8 Hz activity of moderate voltage (25-35 uV) seen predominantly in posterior head regions, symmetric and reactive to eye opening and eye closing. EEG showed intermittent generalized 3 to 6 Hz theta-delta slowing. Hyperventilation and photic stimulation were not performed.   ABNORMALITY - Intermittent slow, generalized IMPRESSION: This study is suggestive of mild diffuse encephalopathy. No seizures or epileptiform discharges were seen throughout the recording. Charlsie Quest   DG Chest Portable 1 View  Result Date: 04/22/2023 CLINICAL DATA:  Altered mental status EXAM: PORTABLE CHEST 1 VIEW COMPARISON:  None Available. FINDINGS: The heart size and mediastinal contours are within normal limits. Both lungs are  clear. The visualized skeletal structures are unremarkable. IMPRESSION: No active disease. Electronically Signed   By: Charlett Nose M.D.   On: 04/22/2023 19:08   CT Head Wo Contrast  Result Date: 04/22/2023 CLINICAL DATA:  Weakness, altered mental status EXAM: CT HEAD WITHOUT CONTRAST TECHNIQUE: Contiguous axial images were obtained from the base of the skull through the vertex without intravenous contrast. RADIATION DOSE REDUCTION: This exam was performed according to the departmental dose-optimization program which includes automated exposure control, adjustment of the mA and/or kV according to patient size and/or use of iterative reconstruction technique. COMPARISON:  Brain MRI 11/21/2019 FINDINGS: Brain: There is no acute intracranial hemorrhage, extra-axial fluid collection, or acute infarct. There is mild parenchymal volume loss with prominence of the ventricular system and extra-axial CSF spaces. Hypodensity in the supratentorial white matter likely reflects underlying chronic small-vessel ischemic change. The pituitary and suprasellar region are normal. There is no mass lesion. There is no mass effect or midline shift. Vascular: There is calcification of the bilateral carotid siphons. Skull: Normal. Negative for fracture or focal lesion. Sinuses/Orbits: The paranasal sinuses are clear. A right glaucoma valve and bilateral lens implants are noted. There is peripheral calcification and atrophy of the left globe which may reflect developing phthisis bulbi. Other: The mastoid air cells and middle ear cavities are clear. IMPRESSION: No acute intracranial pathology. Electronically Signed   By: Lesia Hausen M.D.   On: 04/22/2023 18:49    Labs:  CBC: Recent Labs    05/10/23 0515 05/11/23 0428 05/16/23 0446 05/18/23 0554  WBC 8.3 7.4 12.0* 8.5  HGB 10.2* 9.5* 9.1* 8.6*  HCT 31.5* 28.8* 29.0* 26.8*  PLT 324 371 587* 483*    COAGS: Recent Labs    05/03/23 0423 05/18/23 0554  INR 1.0 0.9     BMP: Recent Labs    05/14/23 0353 05/15/23 0240 05/16/23 0446 05/17/23 0101  NA 148*  145 137 139  K 3.5 3.8 4.1 3.8  CL 112* 110 107 107  CO2 27 24 25 24   GLUCOSE 278* 205* 198* 182*  BUN 46* 46* 40* 39*  CALCIUM 9.1 9.4 8.3* 8.8*  CREATININE 1.03* 0.92 0.79 0.92  GFRNONAA >60 >60 >60 >60    LIVER FUNCTION TESTS: Recent Labs    04/22/23 1626 04/23/23 1712 04/24/23 0238  BILITOT 1.4* 1.1 0.9  AST 35 31 24  ALT 24 23 20   ALKPHOS 60 62 57  PROT 6.9 7.0 5.9*  ALBUMIN 3.7 3.6 3.0*    TUMOR MARKERS: No results for input(s): "AFPTM", "CEA", "CA199", "CHROMGRNA" in the last 8760 hours.  Assessment and Plan:  Scheduled for percutaneous gastric tube placement Will contact family for consent  Thank you for this interesting consult.  I greatly enjoyed meeting Katherine Navarro and look forward to participating in their care.  A copy of this report was sent to the requesting provider on this date.  Electronically Signed: Robet Leu, PA-C 05/18/2023, 6:50 AM   I spent a total of 20 Minutes    in face to face in clinical consultation, greater than 50% of which was counseling/coordinating care for percutaneous gastric tube placement

## 2023-05-18 NOTE — Progress Notes (Addendum)
PROGRESS NOTE        PATIENT DETAILS Name: Katherine Navarro Age: 67 y.o. Sex: female Date of Birth: March 08, 1956 Admit Date: 04/22/2023 Admitting Physician Buena Irish, MD ZOX:[id number], Lelon Mast, [place]  Brief Summary: Patient is a 67 y.o.  female with history of DM-1, HTN, hypothyroidism-who was brought to the ED for weakness/progressive sleepiness-ongoing for several weeks-found to have acute metabolic encephalopathy in the setting of severe hypothyroidism with myxedema and vitamin B1 deficiency.  Significant events: 11/1>> admit to [facility]  Significant studies: 11/1>> CT head: No acute intracranial pathology 11/1>> CXR: No PNA 11/1>> TSH: 44 11/1>> FT4:<0.25 11/2>> Spot EEG: No seizures 11/2>> A1c: 10.5 11/2>> vitamin B1:<20 11/2>> vitamin B12: 1992 11/2>> NH4: 23 11/2>> RPR: Nonreactive 11/9>> renal ultrasound: Increased echogenicity of renal parenchyma bilaterally 11/10>> folate: 2.4 11/11>> right upper extremity Doppler: No DVT 11/18>> MRI brain: No acute findings 11/16>> TSH: 0.110  Significant microbiology data: 11/1>> urine culture: No growth 11/2>> blood culture: No growth  Procedures: 11/13>>Cortak tube 11/27>> PEG tube by IR  Consults: Neurology IR  Subjective: Awake-answers questions appropriately but still overall lethargic.  Objective: Vitals: Blood pressure 132/77, pulse 85, temperature 98.8 F (37.1 C), temperature source Oral, resp. rate 20, height 5\' 3"  (1.6 m), weight 71.9 kg, last menstrual period 05/03/2013, SpO2 95%.   Exam: Awake/alert Soft nontender nondistended Generalized weakness but nonfocal.  Pertinent Labs/Radiology:    Latest Ref Rng & Units 05/18/2023    5:54 AM 05/16/2023    4:46 AM 05/11/2023    4:28 AM  CBC  WBC 4.0 - 10.5 K/uL 8.5  12.0  7.4   Hemoglobin 12.0 - 15.0 g/dL 8.6  9.1  9.5   Hematocrit 36.0 - 46.0 % 26.8  29.0  28.8   Platelets 150 - 400 K/uL 483  587  371     Lab Results   Component Value Date   NA 139 05/17/2023   K 3.8 05/17/2023   CL 107 05/17/2023   CO2 24 05/17/2023      Assessment/Plan: Acute metabolic encephalopathy Thought to be due to severe hypothyroidism with myxedema features and Wernicke's encephalopathy. Continue to treat underlying etiologies Mental status slowly improving but continues to have lethargy/excessive sleepiness Per neurology-encephalopathy can take months/weeks to resolve in the setting..  Severe hypothyroidism with myxedema Per H&P-noncompliant with levothyroxine-likely etiology for severe hypothyroidism. Continue levothyroxine-dosage adjusted 11/19 TSH 11/27 mildly elevated at 7.6-repeat in the next 1-2 weeks  Wernicke's encephalopathy Received several doses of high-dose IV thiamine Currently on oral thiamine supplementation  AKI on CKD stage IIIa AKI hemodynamically mediated- Renal function back to baseline with supportive care  Acute urinary retention Managed with Foley/Flomax Failed voiding trial on 11/26-Foley catheter replaced-will need outpatient voiding trial at this point.  Hypernatremia Resolved with free water flushes.  Hypokalemia Repleted  Failure to thrive syndrome Oropharyngeal dysphagia due to metabolic encephalopathy Waxing/waning encephalopathy continues to be the limiting factor for oral intake-had a  cortrak tube in place since 11/13-calorie count with inadequate oral intake-after extensive discussion with spouse-PEG tube was placed 11/27.  Hopefully once encephalopathy clears-and her diet improves-we will be able to remove PEG tube  Normocytic anemia Secondary to acute/critical illness Required 1 unit of PRBC on 11/11-hemoglobin stable since then Follow CBC periodically  Right Upper extremity hematoma Supportive care  HTN All antihypertensives on hold as BP stable without the  use of any medications  DM-1 Previously on insulin pump CBGs relatively stable Continue Semglee 16  units daily/SSI   Recent Labs    05/18/23 0021 05/18/23 0328 05/18/23 0838  GLUCAP 118* 175* 165*    Nutrition Status: Nutrition Problem: Moderate Malnutrition Etiology: chronic illness Signs/Symptoms: meal completion < 25%, moderate fat depletion, moderate muscle depletion Interventions: Ensure Enlive (each supplement provides 350kcal and 20 grams of protein), MVI, Magic cup  BMI: Estimated body mass index is 28.08 kg/m as calculated from the following:   Height as of this encounter: 5\' 3"  (1.6 m).   Weight as of this encounter: 71.9 kg.   Code status:   Code Status: Full Code   DVT Prophylaxis: enoxaparin (LOVENOX) injection 40 mg Start: 05/19/23 1200 Place and maintain sequential compression device Start: 05/03/23 1035   Family Communication: Spouse-[name] updated on 11/25.   Disposition Plan: Status is: Inpatient Remains inpatient appropriate because: Severity of illness   Planned Discharge Destination:Skilled nursing facility   Diet: Diet Order             Diet NPO time specified Except for: Sips with Meds  Diet effective now                     Antimicrobial agents: Anti-infectives (From admission, onward)    Start     Dose/Rate Route Frequency Ordered Stop   05/18/23 1130  vancomycin (VANCOCIN) IVPB 1000 mg/200 mL premix        1,000 mg 200 mL/hr over 60 Minutes Intravenous  Once 05/18/23 1030          MEDICATIONS: Scheduled Meds:  Chlorhexidine Gluconate Cloth  6 each Topical Q0600   docusate sodium  100 mg Oral BID   [START ON 05/19/2023] enoxaparin (LOVENOX) injection  40 mg Subcutaneous Q24H   famotidine  20 mg Oral Daily   feeding supplement (PROSource TF20)  60 mL Per Tube BID   folic acid  2 mg Oral Daily   free water  400 mL Per Tube Q4H   insulin aspart  0-15 Units Subcutaneous Q4H   insulin glargine-yfgn  16 Units Subcutaneous Daily   lactose free nutrition  237 mL Oral TID WC   levothyroxine  75 mcg Oral Q0600    multivitamin  1 tablet Oral Daily   mouth rinse  15 mL Mouth Rinse 4 times per day   polyethylene glycol  17 g Oral BID   tamsulosin  0.4 mg Oral Daily   thiamine  100 mg Oral Daily   Continuous Infusions:  feeding supplement (GLUCERNA 1.5 CAL) Stopped (05/18/23 0005)   vancomycin     PRN Meds:.acetaminophen **OR** acetaminophen, dextrose, [DISCONTINUED] ondansetron **OR** ondansetron (ZOFRAN) IV, mouth rinse, sorbitol   I have personally reviewed following labs and imaging studies  LABORATORY DATA: CBC: Recent Labs  Lab 05/16/23 0446 05/18/23 0554  WBC 12.0* 8.5  HGB 9.1* 8.6*  HCT 29.0* 26.8*  MCV 90.6 89.0  PLT 587* 483*    Basic Metabolic Panel: Recent Labs  Lab 05/13/23 0352 05/14/23 0353 05/15/23 0240 05/16/23 0446 05/17/23 0101  NA 148* 148* 145 137 139  K 3.9 3.5 3.8 4.1 3.8  CL 114* 112* 110 107 107  CO2 24 27 24 25 24   GLUCOSE 402* 278* 205* 198* 182*  BUN 47* 46* 46* 40* 39*  CREATININE 1.06* 1.03* 0.92 0.79 0.92  CALCIUM 9.6 9.1 9.4 8.3* 8.8*    GFR: Estimated Creatinine Clearance: 57.9 mL/min (by C-G formula  based on SCr of 0.92 mg/dL).  Liver Function Tests: No results for input(s): "AST", "ALT", "ALKPHOS", "BILITOT", "PROT", "ALBUMIN" in the last 168 hours. No results for input(s): "LIPASE", "AMYLASE" in the last 168 hours. Recent Labs  Lab 05/16/23 0446  AMMONIA 26    Coagulation Profile: Recent Labs  Lab 05/18/23 0554  INR 0.9    Cardiac Enzymes: No results for input(s): "CKTOTAL", "CKMB", "CKMBINDEX", "TROPONINI" in the last 168 hours.  BNP (last 3 results) No results for input(s): "PROBNP" in the last 8760 hours.  Lipid Profile: No results for input(s): "CHOL", "HDL", "LDLCALC", "TRIG", "CHOLHDL", "LDLDIRECT" in the last 72 hours.  Thyroid Function Tests: Recent Labs    05/18/23 0415  TSH 7.684*    Anemia Panel: No results for input(s): "VITAMINB12", "FOLATE", "FERRITIN", "TIBC", "IRON", "RETICCTPCT" in the last  72 hours.   Urine analysis:    Component Value Date/Time   COLORURINE YELLOW 04/22/2023 1626   APPEARANCEUR CLEAR 04/22/2023 1626   LABSPEC 1.015 04/22/2023 1626   PHURINE 6.0 04/22/2023 1626   GLUCOSEU 50 (A) 04/22/2023 1626   GLUCOSEU >=1000 (A) 12/16/2017 1110   HGBUR NEGATIVE 04/22/2023 1626   HGBUR moderate 02/01/2008 0902   BILIRUBINUR NEGATIVE 04/22/2023 1626   KETONESUR 5 (A) 04/22/2023 1626   PROTEINUR NEGATIVE 04/22/2023 1626   UROBILINOGEN 0.2 12/16/2017 1110   NITRITE NEGATIVE 04/22/2023 1626   LEUKOCYTESUR NEGATIVE 04/22/2023 1626    Sepsis Labs: Lactic Acid, Venous    Component Value Date/Time   LATICACIDVEN 2.1 (HH) 04/22/2023 2039    MICROBIOLOGY: No results found for this or any previous visit (from the past 240 hour(s)).  RADIOLOGY STUDIES/RESULTS: CT ABDOMEN WO CONTRAST  Result Date: 05/17/2023 CLINICAL DATA:  67 year old female with history of dysphagia, evaluate for gastrostomy tube placement. EXAM: CT ABDOMEN WITHOUT CONTRAST TECHNIQUE: Multidetector CT imaging of the abdomen was performed following the standard protocol without IV contrast. RADIATION DOSE REDUCTION: This exam was performed according to the departmental dose-optimization program which includes automated exposure control, adjustment of the mA and/or kV according to patient size and/or use of iterative reconstruction technique. COMPARISON:  None Available. FINDINGS: Lower chest: Right greater than left mild bibasilar subsegmental atelectasis. The heart is normal in size. Hepatobiliary: The liver is normal in size, contour, and attenuation. No focal mass. The gallbladder is present and distended with layering sludge. No intra or extrahepatic biliary ductal dilation. Pancreas: Unremarkable. No pancreatic ductal dilatation or surrounding inflammatory changes. Spleen: Normal in size without focal abnormality. Adrenals/Urinary Tract: Adrenal glands are unremarkable. Kidneys are normal, without renal  calculi, focal lesion, or hydronephrosis. Stomach/Bowel: Stomach is within normal limits. Enteric feeding tube in place with the distal tip in the gastric antrum. No evidence of bowel wall thickening, distention, or inflammatory changes. Vascular/Lymphatic: Aortic atherosclerosis. No enlarged abdominal lymph nodes. Other: No abdominal wall hernia or abnormality. Musculoskeletal: No acute or significant osseous findings. IMPRESSION: 1. Anatomy is amenable to image guided percutaneous gastrostomy tube placement. 2. Bibasilar subsegmental atelectasis. Marliss Coots, MD [facility] Radiology Electronically Signed   By: Marliss Coots M.D.   On: 05/17/2023 14:01     LOS: 26 days   Jeoffrey Massed, MD  [facility]    To contact the attending provider between 7A-7P or the covering provider during after hours 7P-7A, please log into the web site [email or web address] and access using universal [place] password for that web site. If you do not have the password, please call the hospital operator.  05/18/2023, 11:29 AM

## 2023-05-18 NOTE — Plan of Care (Signed)
Problem: Fluid Volume: Goal: Ability to maintain a balanced intake and output will improve Outcome: Progressing   Problem: Metabolic: Goal: Ability to maintain appropriate glucose levels will improve Outcome: Progressing   Problem: Nutritional: Goal: Maintenance of adequate nutrition will improve Outcome: Progressing

## 2023-05-19 DIAGNOSIS — E039 Hypothyroidism, unspecified: Secondary | ICD-10-CM | POA: Diagnosis not present

## 2023-05-19 DIAGNOSIS — R4182 Altered mental status, unspecified: Secondary | ICD-10-CM | POA: Diagnosis not present

## 2023-05-19 DIAGNOSIS — E440 Moderate protein-calorie malnutrition: Secondary | ICD-10-CM | POA: Diagnosis not present

## 2023-05-19 LAB — BASIC METABOLIC PANEL
Anion gap: 11 (ref 5–15)
BUN: 33 mg/dL — ABNORMAL HIGH (ref 8–23)
CO2: 21 mmol/L — ABNORMAL LOW (ref 22–32)
Calcium: 8.9 mg/dL (ref 8.9–10.3)
Chloride: 104 mmol/L (ref 98–111)
Creatinine, Ser: 0.75 mg/dL (ref 0.44–1.00)
GFR, Estimated: >60 mL/min (ref >60)
Glucose, Bld: 153 mg/dL — ABNORMAL HIGH (ref 70–99)
Potassium: 4 mmol/L (ref 3.5–5.1)
Sodium: 136 mmol/L (ref 135–145)

## 2023-05-19 LAB — GLUCOSE, CAPILLARY
Glucose-Capillary: 231 mg/dL — ABNORMAL HIGH (ref 70–99)
Glucose-Capillary: 269 mg/dL — ABNORMAL HIGH (ref 70–99)
Glucose-Capillary: 283 mg/dL — ABNORMAL HIGH (ref 70–99)
Glucose-Capillary: 216 mg/dL — ABNORMAL HIGH (ref 70–99)

## 2023-05-19 MED ORDER — GLUCERNA 1.5 CAL PO LIQD
1000.0000 mL | ORAL | Status: DC
  Administered 2023-05-19: 1000 mL
  Filled 2023-05-19 (×3): qty 1000

## 2023-05-19 NOTE — Progress Notes (Signed)
   IR Note  Perc G tube placed in IR yesterday  Intact Site is clean and dry In use Afeb  May continue use

## 2023-05-19 NOTE — Progress Notes (Signed)
PROGRESS NOTE        PATIENT DETAILS Name: Katherine Navarro Age: 67 y.o. Sex: female Date of Birth: 1956-04-18 Admit Date: 04/22/2023 Admitting Physician Buena Irish, MD OZD:[id number], Lelon Mast, [place]  Brief Summary: Patient is a 67 y.o.  female with history of DM-1, HTN, hypothyroidism-who was brought to the ED for weakness/progressive sleepiness-ongoing for several weeks-found to have acute metabolic encephalopathy in the setting of severe hypothyroidism with myxedema and vitamin B1 deficiency.  Significant events: 11/1>> admit to [facility]  Significant studies: 11/1>> CT head: No acute intracranial pathology 11/1>> CXR: No PNA 11/1>> TSH: 44 11/1>> FT4:<0.25 11/2>> Spot EEG: No seizures 11/2>> A1c: 10.5 11/2>> vitamin B1:<20 11/2>> vitamin B12: 1992 11/2>> NH4: 23 11/2>> RPR: Nonreactive 11/9>> renal ultrasound: Increased echogenicity of renal parenchyma bilaterally 11/10>> folate: 2.4 11/11>> right upper extremity Doppler: No DVT 11/18>> MRI brain: No acute findings 11/16>> TSH: 0.110  Significant microbiology data: 11/1>> urine culture: No growth 11/2>> blood culture: No growth  Procedures: 11/13>>Cortak tube 11/27>> PEG tube by IR  Consults: Neurology IR  Subjective: Awake-answers some questions appropriately-mostly sleeping/lethargic.  Per overnight RN-tolerated PEG tube feeding overnight without any issues.  Objective: Vitals: Blood pressure 108/61, pulse 88, temperature 97.7 F (36.5 C), temperature source Oral, resp. rate 16, height 5\' 3"  (1.6 m), weight 68.7 kg, last menstrual period 05/03/2013, SpO2 94%.   Exam: Awake/alert Not in any distress Abdomen soft nontender nondistended  Pertinent Labs/Radiology:    Latest Ref Rng & Units 05/18/2023    5:54 AM 05/16/2023    4:46 AM 05/11/2023    4:28 AM  CBC  WBC 4.0 - 10.5 K/uL 8.5  12.0  7.4   Hemoglobin 12.0 - 15.0 g/dL 8.6  9.1  9.5   Hematocrit 36.0 - 46.0 % 26.8  29.0   28.8   Platelets 150 - 400 K/uL 483  587  371     Lab Results  Component Value Date   NA 136 05/18/2023   K 4.0 05/18/2023   CL 104 05/18/2023   CO2 21 (L) 05/18/2023      Assessment/Plan: Acute metabolic encephalopathy Thought to be due to severe hypothyroidism with myxedema features and Wernicke's encephalopathy. Continue to treat underlying etiologies Mental status slowly improving but continues to have lethargy/excessive sleepiness Per neurology-encephalopathy can take months/weeks to resolve in the setting..  Severe hypothyroidism with myxedema Per H&P-noncompliant with levothyroxine-likely etiology for severe hypothyroidism. Continue levothyroxine-dosage adjusted 11/19 TSH 11/27 mildly elevated at 7.6-repeat in the next 1-2 weeks  Wernicke's encephalopathy Received several doses of high-dose IV thiamine Currently on oral thiamine supplementation  AKI on CKD stage IIIa AKI hemodynamically mediated- Renal function back to baseline with supportive care  Acute urinary retention Managed with Foley/Flomax Failed voiding trial on 11/26-Foley catheter replaced-will need outpatient voiding trial at this point.  Hypernatremia Resolved with free water flushes.  Hypokalemia Repleted  Failure to thrive syndrome Oropharyngeal dysphagia due to metabolic encephalopathy Waxing/waning encephalopathy continues to be the limiting factor for oral intake-had a cortrak tube in place since 11/13-calorie count with inadequate oral intake-after extensive discussion with spouse-PEG tube was placed 11/27.  Hopefully once encephalopathy clears-and her diet improves-we will be able to remove PEG tube  Normocytic anemia Secondary to acute/critical illness Required 1 unit of PRBC on 11/11-hemoglobin stable since then Follow CBC periodically  Right Upper extremity hematoma Supportive care  HTN All antihypertensives on hold as BP stable without the use of any  medications  DM-1 Previously on insulin pump CBGs relatively stable Continue Semglee 16 units daily/SSI   Recent Labs    05/18/23 2017 05/18/23 2345 05/19/23 0351  GLUCAP 127* 147* 231*    Nutrition Status: Nutrition Problem: Moderate Malnutrition Etiology: chronic illness Signs/Symptoms: meal completion < 25%, moderate fat depletion, moderate muscle depletion Interventions: Ensure Enlive (each supplement provides 350kcal and 20 grams of protein), MVI, Magic cup  BMI: Estimated body mass index is 26.83 kg/m as calculated from the following:   Height as of this encounter: 5\' 3"  (1.6 m).   Weight as of this encounter: 68.7 kg.   Code status:   Code Status: Full Code   DVT Prophylaxis: enoxaparin (LOVENOX) injection 40 mg Start: 05/19/23 1200 Place and maintain sequential compression device Start: 05/03/23 1035   Family Communication: Spouse-[name]-[phone] updated on 11/25.   Disposition Plan: Status is: Inpatient Remains inpatient appropriate because: Severity of illness   Planned Discharge Destination:Skilled nursing facility   Diet: Diet Order             Diet NPO time specified Except for: Sips with Meds  Diet effective now                     Antimicrobial agents: Anti-infectives (From admission, onward)    Start     Dose/Rate Route Frequency Ordered Stop   05/18/23 1130  vancomycin (VANCOCIN) IVPB 1000 mg/200 mL premix        1,000 mg 200 mL/hr over 60 Minutes Intravenous  Once 05/18/23 1030          MEDICATIONS: Scheduled Meds:  Chlorhexidine Gluconate Cloth  6 each Topical Q0600   docusate  100 mg Per Tube BID   enoxaparin (LOVENOX) injection  40 mg Subcutaneous Q24H   famotidine  20 mg Oral Daily   feeding supplement (PROSource TF20)  60 mL Per Tube BID   folic acid  2 mg Oral Daily   free water  400 mL Per Tube Q4H   insulin aspart  0-15 Units Subcutaneous Q4H   insulin glargine-yfgn  16 Units Subcutaneous Daily   lactose  free nutrition  237 mL Oral TID WC   levothyroxine  75 mcg Oral Q0600   multivitamin  1 tablet Oral Daily   neomycin-bacitracin-polymyxin  1 Application Topical Daily   mouth rinse  15 mL Mouth Rinse 4 times per day   polyethylene glycol  17 g Oral BID   tamsulosin  0.4 mg Oral Daily   thiamine  100 mg Oral Daily   Continuous Infusions:  feeding supplement (GLUCERNA 1.5 CAL) 1,000 mL (05/18/23 1724)   vancomycin Stopped (05/18/23 1233)   PRN Meds:.acetaminophen **OR** acetaminophen, dextrose, [DISCONTINUED] ondansetron **OR** ondansetron (ZOFRAN) IV, mouth rinse, sorbitol   I have personally reviewed following labs and imaging studies  LABORATORY DATA: CBC: Recent Labs  Lab 05/16/23 0446 05/18/23 0554  WBC 12.0* 8.5  HGB 9.1* 8.6*  HCT 29.0* 26.8*  MCV 90.6 89.0  PLT 587* 483*    Basic Metabolic Panel: Recent Labs  Lab 05/14/23 0353 05/15/23 0240 05/16/23 0446 05/17/23 0101 05/18/23 2357  NA 148* 145 137 139 136  K 3.5 3.8 4.1 3.8 4.0  CL 112* 110 107 107 104  CO2 27 24 25 24  21*  GLUCOSE 278* 205* 198* 182* 153*  BUN 46* 46* 40* 39* 33*  CREATININE 1.03* 0.92 0.79 0.92 0.75  CALCIUM  9.1 9.4 8.3* 8.8* 8.9    GFR: Estimated Creatinine Clearance: 65.2 mL/min (by C-G formula based on SCr of 0.75 mg/dL).  Liver Function Tests: No results for input(s): "AST", "ALT", "ALKPHOS", "BILITOT", "PROT", "ALBUMIN" in the last 168 hours. No results for input(s): "LIPASE", "AMYLASE" in the last 168 hours. Recent Labs  Lab 05/16/23 0446  AMMONIA 26    Coagulation Profile: Recent Labs  Lab 05/18/23 0554  INR 0.9    Cardiac Enzymes: No results for input(s): "CKTOTAL", "CKMB", "CKMBINDEX", "TROPONINI" in the last 168 hours.  BNP (last 3 results) No results for input(s): "PROBNP" in the last 8760 hours.  Lipid Profile: No results for input(s): "CHOL", "HDL", "LDLCALC", "TRIG", "CHOLHDL", "LDLDIRECT" in the last 72 hours.  Thyroid Function Tests: Recent Labs     05/18/23 0415  TSH 7.684*    Anemia Panel: No results for input(s): "VITAMINB12", "FOLATE", "FERRITIN", "TIBC", "IRON", "RETICCTPCT" in the last 72 hours.   Urine analysis:    Component Value Date/Time   COLORURINE YELLOW 04/22/2023 1626   APPEARANCEUR CLEAR 04/22/2023 1626   LABSPEC 1.015 04/22/2023 1626   PHURINE 6.0 04/22/2023 1626   GLUCOSEU 50 (A) 04/22/2023 1626   GLUCOSEU >=1000 (A) 12/16/2017 1110   HGBUR NEGATIVE 04/22/2023 1626   HGBUR moderate 02/01/2008 0902   BILIRUBINUR NEGATIVE 04/22/2023 1626   KETONESUR 5 (A) 04/22/2023 1626   PROTEINUR NEGATIVE 04/22/2023 1626   UROBILINOGEN 0.2 12/16/2017 1110   NITRITE NEGATIVE 04/22/2023 1626   LEUKOCYTESUR NEGATIVE 04/22/2023 1626    Sepsis Labs: Lactic Acid, Venous    Component Value Date/Time   LATICACIDVEN 2.1 (HH) 04/22/2023 2039    MICROBIOLOGY: No results found for this or any previous visit (from the past 240 hour(s)).  RADIOLOGY STUDIES/RESULTS: IR GASTROSTOMY TUBE MOD SED  Result Date: 05/18/2023 INDICATION: 67 year old female referred for gastrostomy EXAM: PERC PLACEMENT GASTROSTOMY MEDICATIONS: 1 g vancomycin; Antibiotics were administered within 1 hour of the procedure. ANESTHESIA/SEDATION: Versed 0.5 mg IV; Fentanyl 50 mcg IV Moderate Sedation Time:  10 minutes The patient was continuously monitored during the procedure by the interventional radiology nurse under my direct supervision. CONTRAST:  10 cc-administered into the gastric lumen. FLUOROSCOPY: Radiation Exposure Index (as provided by the fluoroscopic device): 22 mGy Kerma COMPLICATIONS: None PROCEDURE: Informed written consent was obtained from the patient and the patient's family after a thorough discussion of the procedural risks, benefits and alternatives. All questions were addressed. Maximal Sterile Barrier Technique was utilized including caps, mask, sterile gowns, sterile gloves, sterile drape, hand hygiene and skin antiseptic. A timeout was  performed prior to the initiation of the procedure. The epigastrium was prepped with Betadine in a sterile fashion, and a sterile drape was applied covering the operative field. A sterile gown and sterile gloves were used for the procedure. A 5-French orogastric tube is placed under fluoroscopic guidance. Scout imaging of the abdomen confirms barium within the transverse colon. The stomach was distended with gas. Under fluoroscopic guidance, an 18 gauge needle was utilized to puncture the anterior wall of the body of the stomach. An Amplatz wire was advanced through the needle passing a T fastener into the lumen of the stomach. The T fastener was secured for gastropexy. A 9-French sheath was inserted. A snare was advanced through the 9-French sheath. A Teena Dunk was advanced through the orogastric tube. It was snared then pulled out the oral cavity, pulling the snare, as well. The leading edge of the gastrostomy was attached to the snare. It was then pulled  down the esophagus and out the percutaneous site. Tube secured in place. Contrast was injected. Patient tolerated the procedure well and remained hemodynamically stable throughout. No complications were encountered and no significant blood loss encountered. IMPRESSION: Status post fluoroscopic placed percutaneous gastrostomy tube, with 20 [place] pull-through. Signed, Yvone Neu. Miachel Roux, RPVI [facility] Radiology Electronically Signed   By: Gilmer Mor D.O.   On: 05/18/2023 11:28     LOS: 27 days   Jeoffrey Massed, MD  [facility]    To contact the attending provider between 7A-7P or the covering provider during after hours 7P-7A, please log into the web site [email or web address] and access using universal [place] password for that web site. If you do not have the password, please call the hospital operator.  05/19/2023, 10:42 AM

## 2023-05-20 DIAGNOSIS — R109 Unspecified abdominal pain: Secondary | ICD-10-CM | POA: Diagnosis not present

## 2023-05-20 DIAGNOSIS — R1310 Dysphagia, unspecified: Secondary | ICD-10-CM | POA: Diagnosis not present

## 2023-05-20 DIAGNOSIS — R0989 Other specified symptoms and signs involving the circulatory and respiratory systems: Secondary | ICD-10-CM | POA: Diagnosis not present

## 2023-05-20 DIAGNOSIS — I509 Heart failure, unspecified: Secondary | ICD-10-CM | POA: Diagnosis not present

## 2023-05-20 DIAGNOSIS — L89610 Pressure ulcer of right heel, unstageable: Secondary | ICD-10-CM | POA: Diagnosis not present

## 2023-05-20 DIAGNOSIS — G9340 Encephalopathy, unspecified: Secondary | ICD-10-CM | POA: Diagnosis not present

## 2023-05-20 DIAGNOSIS — R112 Nausea with vomiting, unspecified: Secondary | ICD-10-CM | POA: Diagnosis not present

## 2023-05-20 DIAGNOSIS — L89613 Pressure ulcer of right heel, stage 3: Secondary | ICD-10-CM | POA: Diagnosis not present

## 2023-05-20 DIAGNOSIS — R339 Retention of urine, unspecified: Secondary | ICD-10-CM | POA: Diagnosis not present

## 2023-05-20 DIAGNOSIS — G9341 Metabolic encephalopathy: Secondary | ICD-10-CM | POA: Diagnosis not present

## 2023-05-20 DIAGNOSIS — E109 Type 1 diabetes mellitus without complications: Secondary | ICD-10-CM | POA: Diagnosis not present

## 2023-05-20 DIAGNOSIS — R1084 Generalized abdominal pain: Secondary | ICD-10-CM | POA: Diagnosis not present

## 2023-05-20 DIAGNOSIS — E108 Type 1 diabetes mellitus with unspecified complications: Secondary | ICD-10-CM | POA: Diagnosis not present

## 2023-05-20 DIAGNOSIS — I1310 Hypertensive heart and chronic kidney disease without heart failure, with stage 1 through stage 4 chronic kidney disease, or unspecified chronic kidney disease: Secondary | ICD-10-CM | POA: Diagnosis not present

## 2023-05-20 DIAGNOSIS — D509 Iron deficiency anemia, unspecified: Secondary | ICD-10-CM | POA: Diagnosis not present

## 2023-05-20 DIAGNOSIS — K9422 Gastrostomy infection: Secondary | ICD-10-CM | POA: Diagnosis not present

## 2023-05-20 DIAGNOSIS — R1312 Dysphagia, oropharyngeal phase: Secondary | ICD-10-CM | POA: Diagnosis not present

## 2023-05-20 DIAGNOSIS — J101 Influenza due to other identified influenza virus with other respiratory manifestations: Secondary | ICD-10-CM | POA: Diagnosis not present

## 2023-05-20 DIAGNOSIS — I5022 Chronic systolic (congestive) heart failure: Secondary | ICD-10-CM | POA: Diagnosis not present

## 2023-05-20 DIAGNOSIS — D649 Anemia, unspecified: Secondary | ICD-10-CM | POA: Diagnosis not present

## 2023-05-20 DIAGNOSIS — L97522 Non-pressure chronic ulcer of other part of left foot with fat layer exposed: Secondary | ICD-10-CM | POA: Diagnosis present

## 2023-05-20 DIAGNOSIS — I10 Essential (primary) hypertension: Secondary | ICD-10-CM | POA: Diagnosis not present

## 2023-05-20 DIAGNOSIS — E10649 Type 1 diabetes mellitus with hypoglycemia without coma: Secondary | ICD-10-CM | POA: Diagnosis not present

## 2023-05-20 DIAGNOSIS — L89620 Pressure ulcer of left heel, unstageable: Secondary | ICD-10-CM | POA: Diagnosis present

## 2023-05-20 DIAGNOSIS — E872 Acidosis, unspecified: Secondary | ICD-10-CM | POA: Diagnosis present

## 2023-05-20 DIAGNOSIS — S91302A Unspecified open wound, left foot, initial encounter: Secondary | ICD-10-CM | POA: Diagnosis not present

## 2023-05-20 DIAGNOSIS — R4182 Altered mental status, unspecified: Secondary | ICD-10-CM | POA: Diagnosis not present

## 2023-05-20 DIAGNOSIS — R0902 Hypoxemia: Secondary | ICD-10-CM | POA: Diagnosis not present

## 2023-05-20 DIAGNOSIS — G959 Disease of spinal cord, unspecified: Secondary | ICD-10-CM | POA: Diagnosis present

## 2023-05-20 DIAGNOSIS — Z931 Gastrostomy status: Secondary | ICD-10-CM | POA: Diagnosis not present

## 2023-05-20 DIAGNOSIS — G8250 Quadriplegia, unspecified: Secondary | ICD-10-CM | POA: Diagnosis not present

## 2023-05-20 DIAGNOSIS — N390 Urinary tract infection, site not specified: Secondary | ICD-10-CM | POA: Diagnosis not present

## 2023-05-20 DIAGNOSIS — E103293 Type 1 diabetes mellitus with mild nonproliferative diabetic retinopathy without macular edema, bilateral: Secondary | ICD-10-CM | POA: Diagnosis present

## 2023-05-20 DIAGNOSIS — E1052 Type 1 diabetes mellitus with diabetic peripheral angiopathy with gangrene: Secondary | ICD-10-CM | POA: Diagnosis present

## 2023-05-20 DIAGNOSIS — E512 Wernicke's encephalopathy: Secondary | ICD-10-CM | POA: Diagnosis not present

## 2023-05-20 DIAGNOSIS — R627 Adult failure to thrive: Secondary | ICD-10-CM | POA: Diagnosis not present

## 2023-05-20 DIAGNOSIS — L89890 Pressure ulcer of other site, unstageable: Secondary | ICD-10-CM | POA: Diagnosis not present

## 2023-05-20 DIAGNOSIS — A4189 Other specified sepsis: Secondary | ICD-10-CM | POA: Diagnosis not present

## 2023-05-20 DIAGNOSIS — Z713 Dietary counseling and surveillance: Secondary | ICD-10-CM | POA: Diagnosis not present

## 2023-05-20 DIAGNOSIS — J1000 Influenza due to other identified influenza virus with unspecified type of pneumonia: Secondary | ICD-10-CM | POA: Diagnosis not present

## 2023-05-20 DIAGNOSIS — T83511A Infection and inflammatory reaction due to indwelling urethral catheter, initial encounter: Secondary | ICD-10-CM | POA: Diagnosis not present

## 2023-05-20 DIAGNOSIS — Z1152 Encounter for screening for COVID-19: Secondary | ICD-10-CM | POA: Diagnosis not present

## 2023-05-20 DIAGNOSIS — R739 Hyperglycemia, unspecified: Secondary | ICD-10-CM | POA: Diagnosis not present

## 2023-05-20 DIAGNOSIS — R059 Cough, unspecified: Secondary | ICD-10-CM | POA: Diagnosis not present

## 2023-05-20 DIAGNOSIS — J9601 Acute respiratory failure with hypoxia: Secondary | ICD-10-CM | POA: Diagnosis not present

## 2023-05-20 DIAGNOSIS — E1065 Type 1 diabetes mellitus with hyperglycemia: Secondary | ICD-10-CM | POA: Diagnosis not present

## 2023-05-20 DIAGNOSIS — E10621 Type 1 diabetes mellitus with foot ulcer: Secondary | ICD-10-CM | POA: Diagnosis present

## 2023-05-20 DIAGNOSIS — M79675 Pain in left toe(s): Secondary | ICD-10-CM | POA: Diagnosis not present

## 2023-05-20 DIAGNOSIS — Z79899 Other long term (current) drug therapy: Secondary | ICD-10-CM | POA: Diagnosis not present

## 2023-05-20 DIAGNOSIS — I70262 Atherosclerosis of native arteries of extremities with gangrene, left leg: Secondary | ICD-10-CM | POA: Diagnosis not present

## 2023-05-20 DIAGNOSIS — I739 Peripheral vascular disease, unspecified: Secondary | ICD-10-CM | POA: Diagnosis not present

## 2023-05-20 DIAGNOSIS — R630 Anorexia: Secondary | ICD-10-CM | POA: Diagnosis not present

## 2023-05-20 DIAGNOSIS — R404 Transient alteration of awareness: Secondary | ICD-10-CM | POA: Diagnosis not present

## 2023-05-20 DIAGNOSIS — Z7401 Bed confinement status: Secondary | ICD-10-CM | POA: Diagnosis not present

## 2023-05-20 DIAGNOSIS — D638 Anemia in other chronic diseases classified elsewhere: Secondary | ICD-10-CM | POA: Diagnosis present

## 2023-05-20 DIAGNOSIS — E1021 Type 1 diabetes mellitus with diabetic nephropathy: Secondary | ICD-10-CM | POA: Diagnosis present

## 2023-05-20 DIAGNOSIS — R509 Fever, unspecified: Secondary | ICD-10-CM | POA: Diagnosis not present

## 2023-05-20 DIAGNOSIS — E038 Other specified hypothyroidism: Secondary | ICD-10-CM | POA: Diagnosis not present

## 2023-05-20 DIAGNOSIS — Y846 Urinary catheterization as the cause of abnormal reaction of the patient, or of later complication, without mention of misadventure at the time of the procedure: Secondary | ICD-10-CM | POA: Diagnosis present

## 2023-05-20 DIAGNOSIS — N3001 Acute cystitis with hematuria: Secondary | ICD-10-CM | POA: Diagnosis not present

## 2023-05-20 DIAGNOSIS — R000 Tachycardia, unspecified: Secondary | ICD-10-CM | POA: Diagnosis not present

## 2023-05-20 DIAGNOSIS — Z794 Long term (current) use of insulin: Secondary | ICD-10-CM | POA: Diagnosis not present

## 2023-05-20 DIAGNOSIS — S91302D Unspecified open wound, left foot, subsequent encounter: Secondary | ICD-10-CM | POA: Diagnosis not present

## 2023-05-20 DIAGNOSIS — E039 Hypothyroidism, unspecified: Secondary | ICD-10-CM | POA: Diagnosis not present

## 2023-05-20 DIAGNOSIS — E440 Moderate protein-calorie malnutrition: Secondary | ICD-10-CM | POA: Diagnosis not present

## 2023-05-20 DIAGNOSIS — M79672 Pain in left foot: Secondary | ICD-10-CM | POA: Diagnosis not present

## 2023-05-20 DIAGNOSIS — R531 Weakness: Secondary | ICD-10-CM | POA: Diagnosis not present

## 2023-05-20 LAB — GLUCOSE, CAPILLARY
Glucose-Capillary: 119 mg/dL — ABNORMAL HIGH (ref 70–99)
Glucose-Capillary: 129 mg/dL — ABNORMAL HIGH (ref 70–99)
Glucose-Capillary: 155 mg/dL — ABNORMAL HIGH (ref 70–99)
Glucose-Capillary: 171 mg/dL — ABNORMAL HIGH (ref 70–99)
Glucose-Capillary: 195 mg/dL — ABNORMAL HIGH (ref 70–99)
Glucose-Capillary: 263 mg/dL — ABNORMAL HIGH (ref 70–99)

## 2023-05-20 MED ORDER — BOOST PLUS PO LIQD: 237.0000 mL | Freq: Three times a day (TID) | ORAL | Status: DC

## 2023-05-20 MED ORDER — FAMOTIDINE 20 MG PO TABS: 20.0000 mg | ORAL_TABLET | Freq: Every day | ORAL | Status: DC

## 2023-05-20 MED ORDER — VITAMIN B-1 100 MG PO TABS: 100.0000 mg | ORAL_TABLET | Freq: Every day | ORAL | Status: DC

## 2023-05-20 MED ORDER — GLUCERNA 1.5 CAL PO LIQD: ORAL | Status: DC

## 2023-05-20 MED ORDER — TAMSULOSIN HCL 0.4 MG PO CAPS: 0.4000 mg | ORAL_CAPSULE | Freq: Every day | ORAL | Status: DC

## 2023-05-20 MED ORDER — FOLIC ACID 1 MG PO TABS: 2.0000 mg | ORAL_TABLET | Freq: Every day | ORAL | Status: DC

## 2023-05-20 MED ORDER — POLYETHYLENE GLYCOL 3350 17 G PO PACK: 17.0000 g | PACK | Freq: Two times a day (BID) | ORAL | Status: DC

## 2023-05-20 MED ORDER — PROSIGHT PO TABS: 1.0000 | ORAL_TABLET | Freq: Every day | ORAL | Status: DC

## 2023-05-20 MED ORDER — PROSOURCE TF20 ENFIT COMPATIBL EN LIQD: 60.0000 mL | Freq: Two times a day (BID) | ENTERAL | Status: DC

## 2023-05-20 MED ORDER — LEVOTHYROXINE SODIUM 75 MCG PO TABS: 75.0000 ug | ORAL_TABLET | Freq: Every day | ORAL | Status: DC

## 2023-05-20 MED ORDER — INSULIN ASPART 100 UNIT/ML IJ SOLN: INTRAMUSCULAR | Status: DC

## 2023-05-20 MED ORDER — FREE WATER: 400.0000 mL | Status: DC

## 2023-05-20 MED ORDER — INSULIN GLARGINE-YFGN 100 UNIT/ML ~~LOC~~ SOLN: 16.0000 [IU] | Freq: Every day | SUBCUTANEOUS | Status: DC

## 2023-05-20 NOTE — Discharge Summary (Signed)
PATIENT DETAILS Name: Katherine Navarro Age: 67 y.o. Sex: female Date of Birth: 12-25-1955 MRN: [id number]. Admitting Physician: Buena Irish, MD WUJ:[id number], [place], [place]  Admit Date: 04/22/2023 Discharge date: 05/20/2023  Recommendations for Outpatient Follow-up:  Follow up with PCP in 1-2 weeks Please obtain CMP/CBC in one week Repeat TSH in the next 1-2 weeks and adjust dosage of Synthroid accordingly. Failed voiding trial-being discharged with [facility] outpatient voiding trial when a bit more mobile/awake/alert.  Admitted From:  Home  Disposition: Skilled nursing facility   Discharge Condition: good  CODE STATUS:   Code Status: Full Code   Diet recommendation:  Diet Order             Diet - low sodium heart healthy           DIET DYS 3 Room service appropriate? Yes; Fluid consistency: Thin  Diet effective now                    Brief Summary: Patient is a 67 y.o.  female with history of DM-1, HTN, hypothyroidism-who was brought to the ED for weakness/progressive sleepiness-ongoing for several weeks-found to have acute metabolic encephalopathy in the setting of severe hypothyroidism with myxedema and vitamin B1 deficiency.   Significant events: 11/1>> admit to [facility]   Significant studies: 11/1>> CT head: No acute intracranial pathology 11/1>> CXR: No PNA 11/1>> TSH: 44 11/1>> FT4:<0.25 11/2>> Spot EEG: No seizures 11/2>> A1c: 10.5 11/2>> vitamin B1:<20 11/2>> vitamin B12: 1992 11/2>> NH4: 23 11/2>> RPR: Nonreactive 11/9>> renal ultrasound: Increased echogenicity of renal parenchyma bilaterally 11/10>> folate: 2.4 11/11>> right upper extremity Doppler: No DVT 11/18>> MRI brain: No acute findings 11/16>> TSH: 0.110   Significant microbiology data: 11/1>> urine culture: No growth 11/2>> blood culture: No growth   Procedures: 11/13>>Cortak tube 11/27>> PEG tube by IR   Consults: Neurology IR  Brief Hospital Course: Acute metabolic  encephalopathy Thought to be due to severe hypothyroidism with myxedema features and Wernicke's encephalopathy. Continue to treat underlying etiologies Mental status slowly improving but continues to have lethargy/excessive sleepiness Per neurology-encephalopathy can take months/weeks to resolve in the setting.   Severe hypothyroidism with myxedema Per H&P-noncompliant with levothyroxine-likely etiology for severe hypothyroidism. Continue levothyroxine-dosage adjusted 11/19 TSH 11/27 mildly elevated at 7.6-repeat in the next 1-2 weeks   Wernicke's encephalopathy Received several doses of high-dose IV thiamine Currently on oral thiamine supplementation   AKI on CKD stage IIIa AKI hemodynamically mediated- Renal function back to baseline with supportive care   Acute urinary retention Managed with Foley/Flomax Failed voiding trial on 11/26-Foley catheter replaced-will need outpatient voiding trial at this point.   Hypernatremia Resolved with free water flushes.   Hypokalemia Repleted   Failure to thrive syndrome Oropharyngeal dysphagia due to metabolic encephalopathy Waxing/waning encephalopathy continues to be the limiting factor for oral intake-had a cortrak tube in place since 11/13-calorie count with inadequate oral intake-after extensive discussion with spouse-PEG tube was placed 11/27.  She has been on nocturnal tube feeds here in the hospital-along with free water supplementation-continue to encourage oral intake during the daytime (ate 25% of lunch/dinner on 11/28 along with nutritional supplement) and continue nocturnal PEG tube feedings.Hopefully once encephalopathy clears-and her diet improves-PEG tube potentially could be removed.    Normocytic anemia Secondary to acute/critical illness Required 1 unit of PRBC on 11/11-hemoglobin stable since then Follow CBC periodically   Right Upper extremity hematoma Supportive care   HTN All antihypertensives on hold as BP  stable without the  use of any medications   DM-1 Previously on insulin pump CBGs relatively stable Continue Semglee 16 units daily/SSI  Nutrition Status: Nutrition Problem: Moderate Malnutrition Etiology: chronic illness Signs/Symptoms: meal completion < 25%, moderate fat depletion, moderate muscle depletion Interventions: Ensure Enlive (each supplement provides 350kcal and 20 grams of protein), MVI, Magic cup   BMI: Estimated body mass index is 26.83 kg/m as calculated from the following:   Height as of this encounter: 5\' 3"  (1.6 m).   Weight as of this encounter: 68.7 kg.    Discharge Diagnoses:  Principal Problem:   Severe hypothyroidism Active Problems:   Malnutrition of moderate degree   Discharge Instructions:  Activity:  As tolerated with Full fall precautions use walker/cane & assistance as needed  Discharge Instructions     Call MD for:  extreme fatigue   Complete by: As directed    Call MD for:  persistant dizziness or light-headedness   Complete by: As directed    Diet - low sodium heart healthy   Complete by: As directed    Discharge instructions   Complete by: As directed    Follow with Primary MD  Jarold Motto, PA in 1-2 weeks  Please get a complete blood count and chemistry panel checked by your Primary MD at your next visit, and again as instructed by your Primary MD.  Get Medicines reviewed and adjusted: Please take all your medications with you for your next visit with your Primary MD  Laboratory/radiological data: Please request your Primary MD to go over all hospital tests and procedure/radiological results at the follow up, please ask your Primary MD to get all Hospital records sent to his/her office.  In some cases, they will be blood work, cultures and biopsy results pending at the time of your discharge. Please request that your primary care M.D. follows up on these results.  Also Note the following: If you experience worsening of  your admission symptoms, develop shortness of breath, life threatening emergency, suicidal or homicidal thoughts you must seek medical attention immediately by calling 911 or calling your MD immediately  if symptoms less severe.  You must read complete instructions/literature along with all the possible adverse reactions/side effects for all the Medicines you take and that have been prescribed to you. Take any new Medicines after you have completely understood and accpet all the possible adverse reactions/side effects.   Do not drive when taking Pain medications or sleeping medications (Benzodaizepines)  Do not take more than prescribed Pain, Sleep and Anxiety Medications. It is not advisable to combine anxiety,sleep and pain medications without talking with your primary care practitioner  Special Instructions: If you have smoked or chewed Tobacco  in the last 2 yrs please stop smoking, stop any regular Alcohol  and or any Recreational drug use.  Wear Seat belts while driving.  Please note: You were cared for by a hospitalist during your hospital stay. Once you are discharged, your primary care physician will handle any further medical issues. Please note that NO REFILLS for any discharge medications will be authorized once you are discharged, as it is imperative that you return to your primary care physician (or establish a relationship with a primary care physician if you do not have one) for your post hospital discharge needs so that they can reassess your need for medications and monitor your lab values.   Increase activity slowly   Complete by: As directed       Allergies as of 05/20/2023  Reactions   Atorvastatin    REACTION: perceived myalgias   Ciprofloxacin Nausea And Vomiting   Epinephrine    REACTION: thyroid problems   Erythromycin    Can not recall   Morphine Nausea Only   Headache   Peanut-containing Drug Products Hives   Penicillins Hives        Medication  List     STOP taking these medications    Accu-Chek Guide test strip Generic drug: glucose blood   Accu-Chek Softclix Lancets lancets   acetaZOLAMIDE ER 500 MG capsule Commonly known as: DIAMOX   amiloride-hydrochlorothiazide 5-50 MG tablet Commonly known as: MODURETIC   brimonidine 0.1 % Soln Commonly known as: ALPHAGAN P   HumaLOG 100 UNIT/ML injection Generic drug: insulin lispro   MINIMED INFUSION SET-MMT 396 Misc   PARADIGM RESERVOIR Misc       TAKE these medications    famotidine 20 MG tablet Commonly known as: PEPCID Take 1 tablet (20 mg total) by mouth daily.   feeding supplement (PROSource TF20) liquid Place 60 mLs into feeding tube 2 (two) times daily.   folic acid 1 MG tablet Commonly known as: FOLVITE Take 2 tablets (2 mg total) by mouth daily.   free water Soln Place 400 mLs into feeding tube every 4 (four) hours.   insulin aspart 100 UNIT/ML injection Commonly known as: novoLOG 0-15 Units, Subcutaneous, Every 4 hours, CBG < 70: Implement Hypoglycemia measures CBG 70 - 120: 0 units CBG 121 - 150: 2 units CBG 151 - 200: 3 units CBG 201 - 250: 5 units CBG 251 - 300: 8 units CBG 301 - 350: 11 units CBG 351 - 400: 15 units CBG > 400: call MD and obtain STAT lab verification   insulin glargine-yfgn 100 UNIT/ML injection Commonly known as: SEMGLEE Inject 0.16 mLs (16 Units total) into the skin daily.   lactose free nutrition Liqd Take 237 mLs by mouth 3 (three) times daily with meals.   feeding supplement (GLUCERNA 1.5 CAL) Liqd 65 mL/hr, Continuous, Starting at 1800 daily for 12 Hours   levothyroxine 75 MCG tablet Commonly known as: SYNTHROID Take 1 tablet (75 mcg total) by mouth daily at 6 (six) AM. Start taking on: May 21, 2023 What changed:  medication strength how much to take when to take this   multivitamin Tabs tablet Take 1 tablet by mouth daily.   polyethylene glycol 17 g packet Commonly known as: MIRALAX /  GLYCOLAX Take 17 g by mouth 2 (two) times daily.   tamsulosin 0.4 MG Caps capsule Commonly known as: FLOMAX Take 1 capsule (0.4 mg total) by mouth daily.   thiamine 100 MG tablet Commonly known as: Vitamin B-1 Take 1 tablet (100 mg total) by mouth daily.        Contact information for follow-up providers     Jarold Motto, [place]. Schedule an appointment as soon as possible for a visit in 1 week(s).   Specialty: Physician Assistant Contact information: [address] [place] [address] [phone]              Contact information for after-discharge care     Destination     [facility] .   Service: Skilled Nursing Contact [address] [place] [address] [phone]                    Allergies  Allergen Reactions   Atorvastatin     REACTION: perceived myalgias   Ciprofloxacin  Nausea And Vomiting   Epinephrine     REACTION: thyroid problems   Erythromycin     Can not recall   Morphine Nausea Only    Headache   Peanut-Containing Drug Products Hives   Penicillins Hives     Other Procedures/Studies: IR GASTROSTOMY TUBE MOD SED  Result Date: 05/18/2023 INDICATION: 67 year old female referred for gastrostomy EXAM: PERC PLACEMENT GASTROSTOMY MEDICATIONS: 1 g vancomycin; Antibiotics were administered within 1 hour of the procedure. ANESTHESIA/SEDATION: Versed 0.5 mg IV; Fentanyl 50 mcg IV Moderate Sedation Time:  10 minutes The patient was continuously monitored during the procedure by the interventional radiology nurse under my direct supervision. CONTRAST:  10 cc-administered into the gastric lumen. FLUOROSCOPY: Radiation Exposure Index (as provided by the fluoroscopic device): 22 mGy Kerma COMPLICATIONS: None PROCEDURE: Informed written consent was obtained from the patient and the patient's family after a thorough discussion of the procedural risks, benefits and alternatives. All questions were  addressed. Maximal Sterile Barrier Technique was utilized including caps, mask, sterile gowns, sterile gloves, sterile drape, hand hygiene and skin antiseptic. A timeout was performed prior to the initiation of the procedure. The epigastrium was prepped with Betadine in a sterile fashion, and a sterile drape was applied covering the operative field. A sterile gown and sterile gloves were used for the procedure. A 5-French orogastric tube is placed under fluoroscopic guidance. Scout imaging of the abdomen confirms barium within the transverse colon. The stomach was distended with gas. Under fluoroscopic guidance, an 18 gauge needle was utilized to puncture the anterior wall of the body of the stomach. An Amplatz wire was advanced through the needle passing a T fastener into the lumen of the stomach. The T fastener was secured for gastropexy. A 9-French sheath was inserted. A snare was advanced through the 9-French sheath. A Teena Dunk was advanced through the orogastric tube. It was snared then pulled out the oral cavity, pulling the snare, as well. The leading edge of the gastrostomy was attached to the snare. It was then pulled down the esophagus and out the percutaneous site. Tube secured in place. Contrast was injected. Patient tolerated the procedure well and remained hemodynamically stable throughout. No complications were encountered and no significant blood loss encountered. IMPRESSION: Status post fluoroscopic placed percutaneous gastrostomy tube, with 20 [place] pull-through. Signed, Yvone Neu. Miachel Roux, RPVI [facility] Electronically Signed   By: Gilmer Mor D.O.   On: 05/18/2023 11:28   CT ABDOMEN WO CONTRAST  Result Date: 05/17/2023 CLINICAL DATA:  67 year old female with history of dysphagia, evaluate for gastrostomy tube placement. EXAM: CT ABDOMEN WITHOUT CONTRAST TECHNIQUE: Multidetector CT imaging of the abdomen was performed  following the standard protocol without IV contrast. RADIATION DOSE REDUCTION: This exam was performed according to the departmental dose-optimization program which includes automated exposure control, adjustment of the mA and/or kV according to patient size and/or use of iterative reconstruction technique. COMPARISON:  None Available. FINDINGS: Lower chest: Right greater than left mild bibasilar subsegmental atelectasis. The heart is normal in size. Hepatobiliary: The liver is normal in size, contour, and attenuation. No focal mass. The gallbladder is present and distended with layering sludge. No intra or extrahepatic biliary ductal dilation. Pancreas: Unremarkable. No pancreatic ductal dilatation or surrounding inflammatory changes. Spleen: Normal in size without focal abnormality. Adrenals/Urinary Tract: Adrenal glands are unremarkable. Kidneys are normal, without renal calculi, focal lesion, or hydronephrosis. Stomach/Bowel: Stomach is within normal limits. Enteric feeding tube in place with the distal tip  in the gastric antrum. No evidence of bowel wall thickening, distention, or inflammatory changes. Vascular/Lymphatic: Aortic atherosclerosis. No enlarged abdominal lymph nodes. Other: No abdominal wall hernia or abnormality. Musculoskeletal: No acute or significant osseous findings. IMPRESSION: 1. Anatomy is amenable to image guided percutaneous gastrostomy tube placement. 2. Bibasilar subsegmental atelectasis. Marliss Coots, MD [facility] Electronically Signed   By: Marliss Coots M.D.   On: 05/17/2023 14:01   MR BRAIN WO CONTRAST  Result Date: 05/09/2023 CLINICAL DATA:  Altered mental status EXAM: MRI HEAD WITHOUT CONTRAST TECHNIQUE: Multiplanar, multiecho pulse sequences of the brain and surrounding structures were obtained without intravenous contrast. COMPARISON:  Head CT 04/22/2023 FINDINGS: Brain: Diffusion imaging does not show any acute  or subacute infarction or other cause of restricted diffusion. No focal abnormality affects the brainstem or cerebellum. Cerebral hemispheres show generalized volume loss. There are advanced chronic small-vessel ischemic changes of the white matter. No cortical or large vessel territory infarction. No mass lesion, hemorrhage, hydrocephalus or extra-axial collection. Vascular: Major vessels at the base of the brain show flow. Skull and upper cervical spine: Negative Sinuses/Orbits: Sinuses are clear.  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 Other: None IMPRESSION: No acute or reversible finding. Generalized volume loss. Advanced chronic small-vessel ischemic changes of the cerebral hemispheric white matter. Electronically Signed   By: Paulina Fusi M.D.   On: 05/09/2023 16:37   DG Abd Portable 1V  Result Date: 05/04/2023 CLINICAL DATA:  Feeding tube placement. EXAM: PORTABLE ABDOMEN - 1 VIEW COMPARISON:  Chest radiograph dated 05/03/2023. FINDINGS: Feeding tube with tip in the right upper abdomen likely in the distal stomach. IMPRESSION: Feeding tube with tip in the distal stomach. Electronically Signed   By: Elgie Collard M.D.   On: 05/04/2023 17:25   DG Chest Port 1 View  Result Date: 05/03/2023 CLINICAL DATA:  Shortness of breath. EXAM: PORTABLE CHEST 1 VIEW COMPARISON:  04/22/2023 FINDINGS: Slightly prominent vascular markings in the retrocardiac space. No focal lung disease. Heart and mediastinum are within normal limits. Trachea is midline. No acute bone abnormality. Negative for a pneumothorax. IMPRESSION: No acute cardiopulmonary disease. Electronically Signed   By: Richarda Overlie M.D.   On: 05/03/2023 08:13   VAS [place] UPPER EXTREMITY VENOUS DUPLEX  Result Date: 05/02/2023 UPPER VENOUS STUDY  Patient Name:  Katherine Navarro  Date of Exam:   05/02/2023 Medical Rec #: [id number]         Accession #:    [id number] Date of Birth: [birth year]         Patient Gender: F Patient Age:   76 years Exam Location:  [facility] Procedure:      VAS [place] UPPER EXTREMITY VENOUS DUPLEX Referring Phys: Bess Harvest [facility] --------------------------------------------------------------------------------  Indications: Swelling, and bruising Anticoagulation: Patient could not externally extend or rotate arm to be properly evaluated. Limitations: Body habitus, poor ultrasound/tissue interface and rigid. Comparison Study: No prior exam. Performing Technologist: Fernande Bras  Examination Guidelines: A complete evaluation includes B-mode imaging, spectral Doppler, color Doppler, and power Doppler as needed of all accessible portions of each vessel. Bilateral testing is considered an integral part of a complete examination. Limited examinations for reoccurring indications may be performed as noted.  Right Findings: +----------+------------+---------+-----------+----------+-------+ RIGHT     CompressiblePhasicitySpontaneousPropertiesSummary +----------+------------+---------+-----------+----------+-------+ IJV           Full       Yes       Yes                      +----------+------------+---------+-----------+----------+-------+  Subclavian    Full       Yes       Yes                      +----------+------------+---------+-----------+----------+-------+ Axillary      Full       Yes       Yes                      +----------+------------+---------+-----------+----------+-------+ Brachial      Full                                          +----------+------------+---------+-----------+----------+-------+ Cephalic      Full                                          +----------+------------+---------+-----------+----------+-------+ Basilic       Full                                          +----------+------------+---------+-----------+----------+-------+  Left Findings: +----------+------------+---------+-----------+----------+-------+ LEFT       CompressiblePhasicitySpontaneousPropertiesSummary +----------+------------+---------+-----------+----------+-------+ Subclavian               Yes       Yes                      +----------+------------+---------+-----------+----------+-------+  Summary:  Right: No evidence of deep vein thrombosis in the upper extremity. No evidence of superficial vein thrombosis in the upper extremity.  Left: No evidence of thrombosis in the subclavian.  *See table(s) above for measurements and observations.  Diagnosing physician: Gerarda Fraction Electronically signed by Gerarda Fraction on 05/02/2023 at 6:13:29 PM.    Final    US RENAL  Result Date: 04/30/2023 CLINICAL DATA:  Acute kidney injury. EXAM: RENAL / URINARY TRACT ULTRASOUND COMPLETE COMPARISON:  None Available. FINDINGS: Right Kidney: Renal measurements: 9.3 x 4.9 x 4.6 cm = volume: 109 mL. Increased echogenicity of renal parenchyma is noted. No mass or hydronephrosis visualized. Left Kidney: Renal measurements: 9.6 x 5.5 x 6.6 cm = volume: 181 mL. Increased echogenicity of renal parenchyma is noted. No mass or hydronephrosis visualized. Bladder: Moderate amount of mildly echogenic material is noted dependently in urinary bladder suggesting debris. Other: None. IMPRESSION: Increased echogenicity of renal parenchyma is noted bilaterally suggesting medical renal disease. No hydronephrosis or renal obstruction is noted. Moderate amount of mildly echogenic material noted dependently and urinary bladder concerning for debris, potentially inflammatory in etiology. Electronically Signed   By: Lupita Raider M.D.   On: 04/30/2023 11:41   EEG adult  Result Date: 04/23/2023 Charlsie Quest, MD     04/23/2023 12:34 PM Patient Name: Katherine Navarro MRN: [id number] Epilepsy Attending: Charlsie Quest Referring Physician/Provider: Almon Hercules, MD Date:04/23/2023 Duration: 24.50 mins Patient history: 67yo F with ams getting eeg to evaluate for seizure Level of  alertness: Awake AEDs during EEG study: None Technical aspects: This EEG study was done with scalp electrodes positioned according to the 10-20 International system of electrode placement. Electrical activity was reviewed with band pass filter of 1-70Hz , sensitivity of 7 uV/mm, display speed of 54mm/sec  with a 60Hz  notched filter applied as appropriate. EEG data were recorded continuously and digitally stored.  Video monitoring was available and reviewed as appropriate. Description: The posterior dominant rhythm consists of 8 Hz activity of moderate voltage (25-35 uV) seen predominantly in posterior head regions, symmetric and reactive to eye opening and eye closing. EEG showed intermittent generalized 3 to 6 Hz theta-delta slowing. Hyperventilation and photic stimulation were not performed.   ABNORMALITY - Intermittent slow, generalized IMPRESSION: This study is suggestive of mild diffuse encephalopathy. No seizures or epileptiform discharges were seen throughout the recording. Charlsie Quest   DG Chest Portable 1 View  Result Date: 04/22/2023 CLINICAL DATA:  Altered mental status EXAM: PORTABLE CHEST 1 VIEW COMPARISON:  None Available. FINDINGS: The heart size and mediastinal contours are within normal limits. Both lungs are clear. The visualized skeletal structures are unremarkable. IMPRESSION: No active disease. Electronically Signed   By: Charlett Nose M.D.   On: 04/22/2023 19:08   CT Head Wo Contrast  Result Date: 04/22/2023 CLINICAL DATA:  Weakness, altered mental status EXAM: CT HEAD WITHOUT CONTRAST TECHNIQUE: Contiguous axial images were obtained from the base of the skull through the vertex without intravenous contrast. RADIATION DOSE REDUCTION: This exam was performed according to the departmental dose-optimization program which includes automated exposure control, adjustment of the mA and/or kV according to patient size and/or use of iterative reconstruction technique. COMPARISON:  Brain MRI  11/21/2019 FINDINGS: Brain: There is no acute intracranial hemorrhage, extra-axial fluid collection, or acute infarct. There is mild parenchymal volume loss with prominence of the ventricular system and extra-axial CSF spaces. Hypodensity in the supratentorial white matter likely reflects underlying chronic small-vessel ischemic change. The pituitary and suprasellar region are normal. There is no mass lesion. There is no mass effect or midline shift. Vascular: There is calcification of the bilateral carotid siphons. Skull: Normal. Negative for fracture or focal lesion. Sinuses/Orbits: The paranasal sinuses are clear. A right glaucoma valve and bilateral lens implants are noted. There is peripheral calcification and atrophy of the left globe which may reflect developing phthisis bulbi. Other: The mastoid air cells and middle ear cavities are clear. IMPRESSION: No acute intracranial pathology. Electronically Signed   By: Lesia Hausen M.D.   On: 04/22/2023 18:49     TODAY-DAY OF DISCHARGE:  Subjective:   Katherine Navarro today has no headache,no chest abdominal pain,no new weakness tingling or numbness, feels much better wants to go home today.   Objective:   Blood pressure 125/69, pulse 95, temperature 98.4 F (36.9 C), temperature source Oral, resp. rate 14, height 5\' 3"  (1.6 m), weight 69.3 kg, last menstrual period 05/03/2013, SpO2 97%.  Intake/Output Summary (Last 24 hours) at 05/20/2023 0847 Last data filed at 05/20/2023 0053 Gross per 24 hour  Intake 240 ml  Output 1950 ml  Net -1710 ml   Filed Weights   05/18/23 0500 05/19/23 0500 05/20/23 0445  Weight: 71.9 kg 68.7 kg 69.3 kg    Exam: Awake Alert, Oriented *3, No new F.N deficits, Normal affect [place].AT,PERRAL Supple Neck,No JVD, No cervical lymphadenopathy appriciated.  Symmetrical Chest wall movement, Good air movement bilaterally, CTAB RRR,No Gallops,Rubs or new Murmurs, No Parasternal Heave +ve B.Sounds, Abd Soft, Non tender, No  organomegaly appriciated, No rebound -guarding or rigidity. No Cyanosis, Clubbing or edema, No new Rash or bruise   PERTINENT RADIOLOGIC STUDIES: IR GASTROSTOMY TUBE MOD SED  Result Date: 05/18/2023 INDICATION: 67 year old female referred for gastrostomy EXAM: PERC PLACEMENT GASTROSTOMY MEDICATIONS: 1 g vancomycin;  Antibiotics were administered within 1 hour of the procedure. ANESTHESIA/SEDATION: Versed 0.5 mg IV; Fentanyl 50 mcg IV Moderate Sedation Time:  10 minutes The patient was continuously monitored during the procedure by the interventional radiology nurse under my direct supervision. CONTRAST:  10 cc-administered into the gastric lumen. FLUOROSCOPY: Radiation Exposure Index (as provided by the fluoroscopic device): 22 mGy Kerma COMPLICATIONS: None PROCEDURE: Informed written consent was obtained from the patient and the patient's family after a thorough discussion of the procedural risks, benefits and alternatives. All questions were addressed. Maximal Sterile Barrier Technique was utilized including caps, mask, sterile gowns, sterile gloves, sterile drape, hand hygiene and skin antiseptic. A timeout was performed prior to the initiation of the procedure. The epigastrium was prepped with Betadine in a sterile fashion, and a sterile drape was applied covering the operative field. A sterile gown and sterile gloves were used for the procedure. A 5-French orogastric tube is placed under fluoroscopic guidance. Scout imaging of the abdomen confirms barium within the transverse colon. The stomach was distended with gas. Under fluoroscopic guidance, an 18 gauge needle was utilized to puncture the anterior wall of the body of the stomach. An Amplatz wire was advanced through the needle passing a T fastener into the lumen of the stomach. The T fastener was secured for gastropexy. A 9-French sheath was inserted. A snare was advanced through the 9-French sheath. A Teena Dunk was advanced through the orogastric  tube. It was snared then pulled out the oral cavity, pulling the snare, as well. The leading edge of the gastrostomy was attached to the snare. It was then pulled down the esophagus and out the percutaneous site. Tube secured in place. Contrast was injected. Patient tolerated the procedure well and remained hemodynamically stable throughout. No complications were encountered and no significant blood loss encountered. IMPRESSION: Status post fluoroscopic placed percutaneous gastrostomy tube, with 20 [place] pull-through. Signed, Yvone Neu. Miachel Roux, RPVI [facility] Electronically Signed   By: Gilmer Mor D.O.   On: 05/18/2023 11:28     PERTINENT LAB RESULTS: CBC: Recent Labs    05/18/23 0554  WBC 8.5  HGB 8.6*  HCT 26.8*  PLT 483*   CMET CMP     Component Value Date/Time   NA 136 05/18/2023 2357   K 4.0 05/18/2023 2357   CL 104 05/18/2023 2357   CO2 21 (L) 05/18/2023 2357   GLUCOSE 153 (H) 05/18/2023 2357   BUN 33 (H) 05/18/2023 2357   CREATININE 0.75 05/18/2023 2357   CALCIUM 8.9 05/18/2023 2357   PROT 5.9 (L) 04/24/2023 0238   ALBUMIN 3.0 (L) 04/24/2023 0238   AST 24 04/24/2023 0238   ALT 20 04/24/2023 0238   ALKPHOS 57 04/24/2023 0238   BILITOT 0.9 04/24/2023 0238   GFR 47.81 (L) 10/16/2020 1702   GFRNONAA >60 05/18/2023 2357    GFR Estimated Creatinine Clearance: 65.5 mL/min (by C-G formula based on SCr of 0.75 mg/dL). No results for input(s): "LIPASE", "AMYLASE" in the last 72 hours. No results for input(s): "CKTOTAL", "CKMB", "CKMBINDEX", "TROPONINI" in the last 72 hours. Invalid input(s): "POCBNP" No results for input(s): "DDIMER" in the last 72 hours. No results for input(s): "HGBA1C" in the last 72 hours. No results for input(s): "CHOL", "HDL", "LDLCALC", "TRIG", "CHOLHDL", "LDLDIRECT" in the last 72 hours. Recent Labs    05/18/23 0415  TSH 7.684*   No results for input(s): "VITAMINB12",  "FOLATE", "FERRITIN", "TIBC", "IRON", "RETICCTPCT" in the last 72 hours. Coags:  Recent Labs    05/18/23 0554  INR 0.9   Microbiology: No results found for this or any previous visit (from the past 240 hour(s)).  FURTHER DISCHARGE INSTRUCTIONS:  Get Medicines reviewed and adjusted: Please take all your medications with you for your next visit with your Primary MD  Laboratory/radiological data: Please request your Primary MD to go over all hospital tests and procedure/radiological results at the follow up, please ask your Primary MD to get all Hospital records sent to his/her office.  In some cases, they will be blood work, cultures and biopsy results pending at the time of your discharge. Please request that your primary care M.D. goes through all the records of your hospital data and follows up on these results.  Also Note the following: If you experience worsening of your admission symptoms, develop shortness of breath, life threatening emergency, suicidal or homicidal thoughts you must seek medical attention immediately by calling 911 or calling your MD immediately  if symptoms less severe.  You must read complete instructions/literature along with all the possible adverse reactions/side effects for all the Medicines you take and that have been prescribed to you. Take any new Medicines after you have completely understood and accpet all the possible adverse reactions/side effects.   Do not drive when taking Pain medications or sleeping medications (Benzodaizepines)  Do not take more than prescribed Pain, Sleep and Anxiety Medications. It is not advisable to combine anxiety,sleep and pain medications without talking with your primary care practitioner  Special Instructions: If you have smoked or chewed Tobacco  in the last 2 yrs please stop smoking, stop any regular Alcohol  and or any Recreational drug use.  Wear Seat belts while driving.  Please note: You were cared for by a  hospitalist during your hospital stay. Once you are discharged, your primary care physician will handle any further medical issues. Please note that NO REFILLS for any discharge medications will be authorized once you are discharged, as it is imperative that you return to your primary care physician (or establish a relationship with a primary care physician if you do not have one) for your post hospital discharge needs so that they can reassess your need for medications and monitor your lab values.  Total Time spent coordinating discharge including counseling, education and face to face time equals greater than 30 minutes.  SignedJeoffrey Massed 05/20/2023 8:47 AM

## 2023-05-20 NOTE — NC FL2 (Signed)
[place] MEDICAID FL2 LEVEL OF CARE FORM     IDENTIFICATION  Patient Name: Katherine Navarro Birthdate: [birth year] Sex: female Admission Date (Current Location): 04/22/2023  [facility] [place] Number:  Producer, television/film/video and Address:  [facility], [address], [place], [place] [address]      Provider Number: [id number]  Attending Physician Name and Address:  Maretta Bees, MD  Relative Name and Phone Number:       Current Level of Care: Hospital Recommended Level of Care: Skilled Nursing Facility Prior Approval Number:    Date Approved/Denied:   PASRR Number: [id number]  Discharge Plan: SNF    Current Diagnoses: Patient Active Problem List   Diagnosis Date Noted   Malnutrition of moderate degree 05/05/2023   Severe hypothyroidism 04/22/2023   HNP (herniated nucleus pulposus), cervical 02/06/2020   Atypical glandular cells on cervical Pap smear 10/31/2019   Stenosis of cervix 10/31/2019   Uterine leiomyoma 10/31/2019   Vitamin D deficiency 10/31/2019   Endometrial hyperplasia 12/04/2015   Graves' orbitopathy 07/29/2015   Proliferative diabetic retinopathy of both eyes with macular edema associated with type 2 diabetes mellitus (HCC) 03/25/2015   Numbness 11/19/2014   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) 10/13/2013   Insulin pump in place 10/02/2013   Arthralgia 05/14/2013   Cervical intraepithelial neoplasia grade III with severe dysplasia 12/01/2012   Constipation 07/23/2008   Rectal bleeding 07/23/2008   Hypothyroidism (acquired) 12/13/2007   Hyperlipidemia associated with type 2 diabetes mellitus (HCC) 12/13/2007   Diabetes mellitus (HCC) 01/07/2007   Hypertension associated with diabetes (HCC) 01/07/2007    Orientation RESPIRATION BLADDER Height & Weight     Self, Situation, Place  Normal Continent, Indwelling catheter Weight: 152 lb 12.8 oz (69.3 kg) Height:  5\' 3"  (160 cm)  BEHAVIORAL SYMPTOMS/MOOD  NEUROLOGICAL BOWEL NUTRITION STATUS      Incontinent Feeding tube  AMBULATORY STATUS COMMUNICATION OF NEEDS Skin   Extensive Assist Verbally Normal                       Personal Care Assistance Level of Assistance  Bathing, Feeding, Dressing Bathing Assistance: Maximum assistance Feeding assistance: Limited assistance Dressing Assistance: Maximum assistance     Functional Limitations Info  Sight Sight Info: Impaired        SPECIAL CARE FACTORS FREQUENCY  PT (By licensed PT), OT (By licensed OT)     PT Frequency: 5x/week OT Frequency: 5x/week            Contractures Contractures Info: Not present    Additional Factors Info  Code Status, Allergies, Insulin Sliding Scale Code Status Info: Full Allergies Info: Atorvastatin, Ciprofloxacin, Epinephrine, Erythromycin, Morphine, Peanut-containing Drug Products, Penicillins   Insulin Sliding Scale Info: See dc summary       Current Medications (05/20/2023):  This is the current hospital active medication list Current Facility-Administered Medications  Medication Dose Route Frequency Provider Last Rate Last Admin   acetaminophen (TYLENOL) tablet 650 mg  650 mg Oral Q6H PRN Ghimire, Werner Lean, MD       Or   acetaminophen (TYLENOL) suppository 650 mg  650 mg Rectal Q6H PRN Ghimire, Werner Lean, MD       Chlorhexidine Gluconate Cloth 2 % PADS 6 each  6 each Topical Q0600 Leroy Sea, MD   6 each at 05/20/23 0525   dextrose 50 % solution 0-50 mL  0-50 mL Intravenous PRN Buena Irish, MD  50 mL at 04/26/23 1227   docusate (COLACE) 50 MG/5ML liquid 100 mg  100 mg Per Tube BID Maretta Bees, MD       enoxaparin (LOVENOX) injection 40 mg  40 mg Subcutaneous Q24H Ralene Muskrat, PA-C   40 mg at 05/19/23 1130   famotidine (PEPCID) tablet 20 mg  20 mg Oral Daily Maretta Bees, MD   20 mg at 05/19/23 1132   feeding supplement (GLUCERNA 1.5 CAL) liquid 1,000 mL  1,000 mL Per Tube Continuous Maretta Bees,  MD 65 mL/hr at 05/19/23 1827 1,000 mL at 05/19/23 1827   feeding supplement (PROSource TF20) liquid 60 mL  60 mL Per Tube BID Maretta Bees, MD   60 mL at 05/19/23 2113   folic acid (FOLVITE) tablet 2 mg  2 mg Oral Daily Maretta Bees, MD   2 mg at 05/19/23 1131   free water 400 mL  400 mL Per Tube Q4H Maretta Bees, MD   400 mL at 05/20/23 0415   insulin aspart (novoLOG) injection 0-15 Units  0-15 Units Subcutaneous Q4H John Giovanni, MD   5 Units at 05/19/23 2111   insulin glargine-yfgn [facility]) injection 16 Units  16 Units Subcutaneous Daily Maretta Bees, MD   16 Units at 05/19/23 1129   lactose free nutrition (BOOST PLUS) liquid 237 mL  237 mL Oral TID WC Leroy Sea, MD   237 mL at 05/19/23 1830   levothyroxine (SYNTHROID) tablet 75 mcg  75 mcg Oral Q0600 Elgergawy, Leana Roe, MD   75 mcg at 05/20/23 7564   multivitamin (PROSIGHT) tablet 1 tablet  1 tablet Oral Daily Elgergawy, Leana Roe, MD   1 tablet at 05/19/23 1134   neomycin-bacitracin-polymyxin [phone] OINT 1 Application  1 Application Topical Daily Gilmer Mor, DO   1 Application at 05/19/23 1135   ondansetron (ZOFRAN) injection 4 mg  4 mg Intravenous Q6H PRN Buena Irish, MD       Oral care mouth rinse  15 mL Mouth Rinse 4 times per day Leroy Sea, MD   15 mL at 05/19/23 2113   Oral care mouth rinse  15 mL Mouth Rinse PRN Leroy Sea, MD       polyethylene glycol (MIRALAX / GLYCOLAX) packet 17 g  17 g Oral BID Ghimire, Werner Lean, MD       sorbitol 70 % solution 30 mL  30 mL Oral Daily PRN Buena Irish, MD       tamsulosin [facility]) capsule 0.4 mg  0.4 mg Oral Daily Susa Raring K, MD   0.4 mg at 05/19/23 1131   thiamine (VITAMIN B1) tablet 100 mg  100 mg Oral Daily Maretta Bees, MD   100 mg at 05/19/23 1131   vancomycin (VANCOCIN) IVPB 1000 mg/200 mL premix  1,000 mg Intravenous Once Ralene Muskrat, PA-C   Stopped at 05/18/23 1233     Discharge  Medications: Please see discharge summary for a list of discharge medications.  Relevant Imaging Results:  Relevant Lab Results:   Additional Information SSN: [id number] [place], [place]

## 2023-05-20 NOTE — TOC Transition Note (Signed)
Transition of Care [facility]) - CM/SW Discharge Note   Patient Details  Name: Katherine Navarro MRN: [id number] Date of Birth: Mar 02, 1956  [facility]) CM/SW Contact:  Mearl Latin, LCSW Phone Number: 05/20/2023, 2:49 PM   Clinical Narrative:    Patient will DC to: [facility] Anticipated DC date: 05/20/23 Family notified: Spouse Transport by: Sharin Mons   Per MD patient ready for DC to [facility]. RN to call report prior to discharge [phone] room 106A). RN, patient, patient's family, and facility notified of DC. Discharge Summary and FL2 sent to facility. DC packet on chart. Ambulance transport requested for patient.   CSW will sign off for now as social work intervention is no longer needed. Please consult [place] again if new needs arise.     Final next level of care: Skilled Nursing Facility Barriers to Discharge: Barriers Resolved   Patient Goals and CMS Choice CMS Medicare.gov Compare Post Acute Care list provided to:: Patient Represenative (must comment) Choice offered to / list presented to : Spouse  Discharge Placement     Existing PASRR number confirmed : 05/20/23          Patient chooses bed at: [facility] Patient to be transferred to facility by: [facility] Name of family member notified: Spouse Patient and family notified of of transfer: 05/20/23  Discharge Plan and Services Additional resources added to the After Visit Summary for   In-house Referral: Clinical Social Work   Post Acute Care Choice: Skilled Nursing Facility                               Social Determinants of Health (SDOH) Interventions SDOH Screenings   Food Insecurity: No Food Insecurity (04/25/2023)  Housing: Low Risk  (04/25/2023)  Transportation Needs: No Transportation Needs (04/25/2023)  Utilities: Not At Risk (04/25/2023)  Depression (PHQ2-9): Low Risk  (10/31/2019)  Tobacco Use: Low Risk  (05/18/2023)     Readmission Risk Interventions     No data to display

## 2023-05-20 NOTE — TOC Progression Note (Signed)
Transition of Care [facility]) - Progression Note    Patient Details  Name: Katherine Navarro MRN: [id number] Date of Birth: 08-Jan-1956  [facility]) CM/SW Contact  Mearl Latin, LCSW Phone Number: 05/20/2023, 8:58 AM  Clinical Narrative:    CSW updated patient's spouse on discharge today and updated [facility] on tube feeding formula.    Expected Discharge Plan: Skilled Nursing Facility Barriers to Discharge: Barriers Resolved  Expected Discharge Plan and Services In-house Referral: Clinical Social Work   Post Acute Care Choice: Skilled Nursing Facility Living arrangements for the past 2 months: Single Family Home Expected Discharge Date: 05/20/23                                     Social Determinants of Health (SDOH) Interventions SDOH Screenings   Food Insecurity: No Food Insecurity (04/25/2023)  Housing: Low Risk  (04/25/2023)  Transportation Needs: No Transportation Needs (04/25/2023)  Utilities: Not At Risk (04/25/2023)  Depression (PHQ2-9): Low Risk  (10/31/2019)  Tobacco Use: Low Risk  (05/18/2023)    Readmission Risk Interventions     No data to display

## 2023-05-20 NOTE — Progress Notes (Signed)
Report given to [facility] [phone], to nurse Pam.

## 2023-05-20 NOTE — Plan of Care (Signed)
  Problem: Education: Goal: Ability to describe self-care measures that may prevent or decrease complications (Diabetes Survival Skills Education) will improve Outcome: Not Progressing Goal: Individualized Educational Video(s) Outcome: Not Progressing   Problem: Coping: Goal: Ability to adjust to condition or change in health will improve Outcome: Not Progressing   Problem: Fluid Volume: Goal: Ability to maintain a balanced intake and output will improve Outcome: Not Progressing   Problem: Health Behavior/Discharge Planning: Goal: Ability to identify and utilize available resources and services will improve Outcome: Not Progressing Goal: Ability to manage health-related needs will improve Outcome: Not Progressing   Problem: Metabolic: Goal: Ability to maintain appropriate glucose levels will improve Outcome: Not Progressing   Problem: Nutritional: Goal: Maintenance of adequate nutrition will improve Outcome: Not Progressing Goal: Progress toward achieving an optimal weight will improve Outcome: Not Progressing   Problem: Skin Integrity: Goal: Risk for impaired skin integrity will decrease Outcome: Not Progressing   Problem: Tissue Perfusion: Goal: Adequacy of tissue perfusion will improve Outcome: Not Progressing   Problem: Education: Goal: Ability to describe self-care measures that may prevent or decrease complications (Diabetes Survival Skills Education) will improve Outcome: Not Progressing Goal: Individualized Educational Video(s) Outcome: Not Progressing   Problem: Cardiac: Goal: Ability to maintain an adequate cardiac output will improve Outcome: Not Progressing   Problem: Health Behavior/Discharge Planning: Goal: Ability to identify and utilize available resources and services will improve Outcome: Not Progressing Goal: Ability to manage health-related needs will improve Outcome: Not Progressing   Problem: Fluid Volume: Goal: Ability to achieve a  balanced intake and output will improve Outcome: Not Progressing   Problem: Metabolic: Goal: Ability to maintain appropriate glucose levels will improve Outcome: Not Progressing   Problem: Nutritional: Goal: Maintenance of adequate nutrition will improve Outcome: Not Progressing Goal: Maintenance of adequate weight for body size and type will improve Outcome: Not Progressing   Problem: Respiratory: Goal: Will regain and/or maintain adequate ventilation Outcome: Not Progressing   Problem: Urinary Elimination: Goal: Ability to achieve and maintain adequate renal perfusion and functioning will improve Outcome: Not Progressing   Problem: Education: Goal: Knowledge of General Education information will improve Description: Including pain rating scale, medication(s)/side effects and non-pharmacologic comfort measures Outcome: Not Progressing   Problem: Health Behavior/Discharge Planning: Goal: Ability to manage health-related needs will improve Outcome: Not Progressing   Problem: Clinical Measurements: Goal: Ability to maintain clinical measurements within normal limits will improve Outcome: Not Progressing Goal: Will remain free from infection Outcome: Not Progressing Goal: Diagnostic test results will improve Outcome: Not Progressing Goal: Respiratory complications will improve Outcome: Not Progressing Goal: Cardiovascular complication will be avoided Outcome: Not Progressing   Problem: Activity: Goal: Risk for activity intolerance will decrease Outcome: Not Progressing   Problem: Nutrition: Goal: Adequate nutrition will be maintained Outcome: Not Progressing   Problem: Coping: Goal: Level of anxiety will decrease Outcome: Not Progressing   Problem: Elimination: Goal: Will not experience complications related to bowel motility Outcome: Not Progressing Goal: Will not experience complications related to urinary retention Outcome: Not Progressing   Problem: Pain  Management: Goal: General experience of comfort will improve Outcome: Not Progressing   Problem: Safety: Goal: Ability to remain free from injury will improve Outcome: Not Progressing   Problem: Skin Integrity: Goal: Risk for impaired skin integrity will decrease Outcome: Not Progressing

## 2023-05-20 NOTE — Plan of Care (Signed)

## 2023-05-20 NOTE — Care Plan (Signed)
Patient left unit via stretcher for transfer to SNF.

## 2023-05-20 NOTE — Progress Notes (Signed)
Occupational Therapy Treatment Patient Details Name: Katherine Navarro MRN: [id number] DOB: 08/04/1955 Today's Date: 05/20/2023   History of present illness Pt is a 67 y/o F presenting to ED on 11/1 with progressive sleepiness, CT head negative, Admitted for metabolic encephalopathy in setting of hypothyroidism with elevated TSH. PMH includes DM1, hypothyroidism, HTN, diabetic retinopathy   OT comments  This 67 yo female is slowly making progress with being more alert (maintaining eyes open more) each time I have seen her; as well as following one step commands (50% with increased time). She will continue to benefit from acute OT with follow up continued inpatient follow up therapy, <3 hours/day. Goals updated this session.      If plan is discharge home, recommend the following:  Two people to help with walking and/or transfers;Assistance with cooking/housework;Assistance with feeding;Help with stairs or ramp for entrance;Assist for transportation;Direct supervision/assist for medications management;Two people to help with bathing/dressing/bathroom;Direct supervision/assist for financial management   Equipment Recommendations  Other (comment) (TBD next venue)       Precautions / Restrictions Precautions Precautions: Fall Restrictions Weight Bearing Restrictions: No              ADL either performed or assessed with clinical judgement   ADL Overall ADL's : Needs assistance/impaired Eating/Feeding: Total assistance;Bed level Eating/Feeding Details (indicate cue type and reason): Did not attempt to self feed. Grooming: Therapist, nutritional;Moderate assistance;Bed level Grooming Details (indicate cue type and reason): able to wash lower face on both sides with sometimes only use of one hand and others hands together                                    Extremity/Trunk Assessment Upper Extremity Assessment Upper Extremity Assessment: RUE deficits/detail;LUE  deficits/detail RUE Deficits / Details: moving arms and hands on command today. Very emaciated hands with strength throughout of 2/5. Stiff finger joints for flexion (can get ~3/5 PROM) RUE Coordination: decreased fine motor;decreased gross motor            Vision Baseline Vision/History: 2 Legally blind            Cognition Arousal: Lethargic (but she continually is more awake each session that she has been)   Overall Cognitive Status: No family/caregiver present to determine baseline cognitive functioning Area of Impairment: Orientation                 Orientation Level: Disoriented to, Time, Place             General Comments: Followed 50% of one step commands with increased time or repeating the same command. Does not do well with commands that involve vision due to h/o vision issues and potentally increasing to these (hard to say due to decreased communication)--does not initiate any.                   Pertinent Vitals/ Pain       Pain Assessment Pain Assessment: Faces Faces Pain Scale: No hurt         Frequency  Min 1X/week        Progress Toward Goals  OT Goals(current goals can now be found in the care plan section)  Progress towards OT goals: Progressing toward goals (slowly)  Acute Rehab OT Goals Patient Stated Goal: unable OT Goal Formulation: Patient unable to participate in goal setting Time For Goal Achievement: 06/03/23 Potential to Achieve Goals: Fair  Plan         AM-PAC OT "6 Clicks" Daily Activity     Outcome Measure   Help from another person eating meals?: Total Help from another person taking care of personal grooming?: Total Help from another person toileting, which includes using toliet, bedpan, or urinal?: Total Help from another person bathing (including washing, rinsing, drying)?: Total Help from another person to put on and taking off regular upper body clothing?: Total Help from another person to put on and  taking off regular lower body clothing?: Total 6 Click Score: 6    End of Session    OT Visit Diagnosis: Muscle weakness (generalized) (M62.81);Low vision, both eyes (H54.2);Other symptoms and signs involving cognitive function   Activity Tolerance Patient limited by lethargy   Patient Left in bed;with call bell/phone within reach;with bed alarm set           Time: [phone] OT Time Calculation (min): 27 min  Charges: OT General Charges $OT Visit: 1 Visit OT Treatments $Self Care/Home Management : 23-37 mins  Lindon Romp OT Acute Rehabilitation Services Office [phone]    Evette Georges 05/20/2023, 12:08 PM

## 2023-05-20 NOTE — Plan of Care (Signed)
Problem: Education: Goal: Ability to describe self-care measures that may prevent or decrease complications (Diabetes Survival Skills Education) will improve 05/20/2023 1806 by Rosalio Macadamia, RN Outcome: Adequate for Discharge 05/20/2023 1806 by Rosalio Macadamia, RN Outcome: Progressing Goal: Individualized Educational Video(s) 05/20/2023 1806 by Rosalio Macadamia, RN Outcome: Adequate for Discharge 05/20/2023 1806 by Rosalio Macadamia, RN Outcome: Progressing   Problem: Coping: Goal: Ability to adjust to condition or change in health will improve 05/20/2023 1806 by Rosalio Macadamia, RN Outcome: Adequate for Discharge 05/20/2023 1806 by Rosalio Macadamia, RN Outcome: Progressing   Problem: Fluid Volume: Goal: Ability to maintain a balanced intake and output will improve 05/20/2023 1806 by Rosalio Macadamia, RN Outcome: Adequate for Discharge 05/20/2023 1806 by Rosalio Macadamia, RN Outcome: Progressing   Problem: Health Behavior/Discharge Planning: Goal: Ability to identify and utilize available resources and services will improve 05/20/2023 1806 by Rosalio Macadamia, RN Outcome: Adequate for Discharge 05/20/2023 1806 by Rosalio Macadamia, RN Outcome: Progressing Goal: Ability to manage health-related needs will improve 05/20/2023 1806 by Rosalio Macadamia, RN Outcome: Adequate for Discharge 05/20/2023 1806 by Rosalio Macadamia, RN Outcome: Progressing   Problem: Metabolic: Goal: Ability to maintain appropriate glucose levels will improve 05/20/2023 1806 by Rosalio Macadamia, RN Outcome: Adequate for Discharge 05/20/2023 1806 by Rosalio Macadamia, RN Outcome: Progressing   Problem: Nutritional: Goal: Maintenance of adequate nutrition will improve 05/20/2023 1806 by Rosalio Macadamia, RN Outcome: Adequate for Discharge 05/20/2023 1806 by Rosalio Macadamia, RN Outcome: Progressing Goal: Progress toward achieving an optimal weight will improve 05/20/2023 1806 by Rosalio Macadamia, RN Outcome:  Adequate for Discharge 05/20/2023 1806 by Rosalio Macadamia, RN Outcome: Progressing   Problem: Skin Integrity: Goal: Risk for impaired skin integrity will decrease 05/20/2023 1806 by Rosalio Macadamia, RN Outcome: Adequate for Discharge 05/20/2023 1806 by Rosalio Macadamia, RN Outcome: Progressing   Problem: Tissue Perfusion: Goal: Adequacy of tissue perfusion will improve 05/20/2023 1806 by Rosalio Macadamia, RN Outcome: Adequate for Discharge 05/20/2023 1806 by Rosalio Macadamia, RN Outcome: Progressing   Problem: Education: Goal: Ability to describe self-care measures that may prevent or decrease complications (Diabetes Survival Skills Education) will improve 05/20/2023 1806 by Rosalio Macadamia, RN Outcome: Adequate for Discharge 05/20/2023 1806 by Rosalio Macadamia, RN Outcome: Progressing Goal: Individualized Educational Video(s) 05/20/2023 1806 by Rosalio Macadamia, RN Outcome: Adequate for Discharge 05/20/2023 1806 by Rosalio Macadamia, RN Outcome: Progressing   Problem: Cardiac: Goal: Ability to maintain an adequate cardiac output will improve 05/20/2023 1806 by Rosalio Macadamia, RN Outcome: Adequate for Discharge 05/20/2023 1806 by Rosalio Macadamia, RN Outcome: Progressing   Problem: Health Behavior/Discharge Planning: Goal: Ability to identify and utilize available resources and services will improve 05/20/2023 1806 by Rosalio Macadamia, RN Outcome: Adequate for Discharge 05/20/2023 1806 by Rosalio Macadamia, RN Outcome: Progressing Goal: Ability to manage health-related needs will improve 05/20/2023 1806 by Rosalio Macadamia, RN Outcome: Adequate for Discharge 05/20/2023 1806 by Rosalio Macadamia, RN Outcome: Progressing   Problem: Fluid Volume: Goal: Ability to achieve a balanced intake and output will improve 05/20/2023 1806 by Rosalio Macadamia, RN Outcome: Adequate for Discharge 05/20/2023 1806 by Rosalio Macadamia, RN Outcome: Progressing   Problem: Metabolic: Goal: Ability  to maintain appropriate glucose levels will improve 05/20/2023 1806 by Rosalio Macadamia, RN Outcome: Adequate for Discharge 05/20/2023 1806 by Rosalio Macadamia, RN Outcome: Progressing   Problem:  Nutritional: Goal: Maintenance of adequate nutrition will improve 05/20/2023 1806 by Rosalio Macadamia, RN Outcome: Adequate for Discharge 05/20/2023 1806 by Rosalio Macadamia, RN Outcome: Progressing Goal: Maintenance of adequate weight for body size and type will improve 05/20/2023 1806 by Rosalio Macadamia, RN Outcome: Adequate for Discharge 05/20/2023 1806 by Rosalio Macadamia, RN Outcome: Progressing   Problem: Respiratory: Goal: Will regain and/or maintain adequate ventilation 05/20/2023 1806 by Rosalio Macadamia, RN Outcome: Adequate for Discharge 05/20/2023 1806 by Rosalio Macadamia, RN Outcome: Progressing   Problem: Urinary Elimination: Goal: Ability to achieve and maintain adequate renal perfusion and functioning will improve 05/20/2023 1806 by Rosalio Macadamia, RN Outcome: Adequate for Discharge 05/20/2023 1806 by Rosalio Macadamia, RN Outcome: Progressing   Problem: Education: Goal: Knowledge of General Education information will improve Description: Including pain rating scale, medication(s)/side effects and non-pharmacologic comfort measures 05/20/2023 1806 by Rosalio Macadamia, RN Outcome: Adequate for Discharge 05/20/2023 1806 by Rosalio Macadamia, RN Outcome: Progressing   Problem: Health Behavior/Discharge Planning: Goal: Ability to manage health-related needs will improve 05/20/2023 1806 by Rosalio Macadamia, RN Outcome: Adequate for Discharge 05/20/2023 1806 by Rosalio Macadamia, RN Outcome: Progressing   Problem: Clinical Measurements: Goal: Ability to maintain clinical measurements within normal limits will improve 05/20/2023 1806 by Rosalio Macadamia, RN Outcome: Adequate for Discharge 05/20/2023 1806 by Rosalio Macadamia, RN Outcome: Progressing Goal: Will remain free from  infection 05/20/2023 1806 by Rosalio Macadamia, RN Outcome: Adequate for Discharge 05/20/2023 1806 by Rosalio Macadamia, RN Outcome: Progressing Goal: Diagnostic test results will improve 05/20/2023 1806 by Rosalio Macadamia, RN Outcome: Adequate for Discharge 05/20/2023 1806 by Rosalio Macadamia, RN Outcome: Progressing Goal: Respiratory complications will improve 05/20/2023 1806 by Rosalio Macadamia, RN Outcome: Adequate for Discharge 05/20/2023 1806 by Rosalio Macadamia, RN Outcome: Progressing Goal: Cardiovascular complication will be avoided 05/20/2023 1806 by Rosalio Macadamia, RN Outcome: Adequate for Discharge 05/20/2023 1806 by Rosalio Macadamia, RN Outcome: Progressing   Problem: Activity: Goal: Risk for activity intolerance will decrease 05/20/2023 1806 by Rosalio Macadamia, RN Outcome: Adequate for Discharge 05/20/2023 1806 by Rosalio Macadamia, RN Outcome: Progressing   Problem: Nutrition: Goal: Adequate nutrition will be maintained 05/20/2023 1806 by Rosalio Macadamia, RN Outcome: Adequate for Discharge 05/20/2023 1806 by Rosalio Macadamia, RN Outcome: Progressing   Problem: Coping: Goal: Level of anxiety will decrease 05/20/2023 1806 by Rosalio Macadamia, RN Outcome: Adequate for Discharge 05/20/2023 1806 by Rosalio Macadamia, RN Outcome: Progressing   Problem: Elimination: Goal: Will not experience complications related to bowel motility 05/20/2023 1806 by Rosalio Macadamia, RN Outcome: Adequate for Discharge 05/20/2023 1806 by Rosalio Macadamia, RN Outcome: Progressing Goal: Will not experience complications related to urinary retention 05/20/2023 1806 by Rosalio Macadamia, RN Outcome: Adequate for Discharge 05/20/2023 1806 by Rosalio Macadamia, RN Outcome: Progressing   Problem: Pain Management: Goal: General experience of comfort will improve 05/20/2023 1806 by Rosalio Macadamia, RN Outcome: Adequate for Discharge 05/20/2023 1806 by Rosalio Macadamia, RN Outcome: Progressing    Problem: Safety: Goal: Ability to remain free from injury will improve 05/20/2023 1806 by Rosalio Macadamia, RN Outcome: Adequate for Discharge 05/20/2023 1806 by Rosalio Macadamia, RN Outcome: Progressing   Problem: Skin Integrity: Goal: Risk for impaired skin integrity will decrease 05/20/2023 1806 by Rosalio Macadamia, RN Outcome: Adequate for Discharge 05/20/2023 1806 by Rosalio Macadamia, RN Outcome: Progressing

## 2023-05-23 DIAGNOSIS — K9422 Gastrostomy infection: Secondary | ICD-10-CM | POA: Diagnosis not present

## 2023-05-25 DIAGNOSIS — I10 Essential (primary) hypertension: Secondary | ICD-10-CM | POA: Diagnosis not present

## 2023-05-25 DIAGNOSIS — E109 Type 1 diabetes mellitus without complications: Secondary | ICD-10-CM | POA: Diagnosis not present

## 2023-05-25 DIAGNOSIS — Z79899 Other long term (current) drug therapy: Secondary | ICD-10-CM | POA: Diagnosis not present

## 2023-05-25 DIAGNOSIS — E039 Hypothyroidism, unspecified: Secondary | ICD-10-CM | POA: Diagnosis not present

## 2023-05-25 DIAGNOSIS — G9341 Metabolic encephalopathy: Secondary | ICD-10-CM | POA: Diagnosis not present

## 2023-05-27 DIAGNOSIS — K9422 Gastrostomy infection: Secondary | ICD-10-CM | POA: Diagnosis not present

## 2023-05-27 DIAGNOSIS — E039 Hypothyroidism, unspecified: Secondary | ICD-10-CM | POA: Diagnosis not present

## 2023-05-27 DIAGNOSIS — R109 Unspecified abdominal pain: Secondary | ICD-10-CM | POA: Diagnosis not present

## 2023-05-30 DIAGNOSIS — E1065 Type 1 diabetes mellitus with hyperglycemia: Secondary | ICD-10-CM | POA: Diagnosis not present

## 2023-05-30 DIAGNOSIS — K9422 Gastrostomy infection: Secondary | ICD-10-CM | POA: Diagnosis not present

## 2023-06-02 DIAGNOSIS — K9422 Gastrostomy infection: Secondary | ICD-10-CM | POA: Diagnosis not present

## 2023-06-02 DIAGNOSIS — D649 Anemia, unspecified: Secondary | ICD-10-CM | POA: Diagnosis not present

## 2023-06-02 DIAGNOSIS — E039 Hypothyroidism, unspecified: Secondary | ICD-10-CM | POA: Diagnosis not present

## 2023-06-07 DIAGNOSIS — E1065 Type 1 diabetes mellitus with hyperglycemia: Secondary | ICD-10-CM | POA: Diagnosis not present

## 2023-06-09 DIAGNOSIS — E108 Type 1 diabetes mellitus with unspecified complications: Secondary | ICD-10-CM | POA: Diagnosis not present

## 2023-06-09 DIAGNOSIS — E039 Hypothyroidism, unspecified: Secondary | ICD-10-CM | POA: Diagnosis not present

## 2023-06-16 DIAGNOSIS — L89890 Pressure ulcer of other site, unstageable: Secondary | ICD-10-CM | POA: Diagnosis not present

## 2023-06-16 DIAGNOSIS — L89610 Pressure ulcer of right heel, unstageable: Secondary | ICD-10-CM | POA: Diagnosis not present

## 2023-06-20 ENCOUNTER — Telehealth: Payer: Self-pay | Admitting: *Deleted

## 2023-06-20 DIAGNOSIS — M79675 Pain in left toe(s): Secondary | ICD-10-CM | POA: Diagnosis not present

## 2023-06-20 DIAGNOSIS — M79672 Pain in left foot: Secondary | ICD-10-CM | POA: Diagnosis not present

## 2023-06-20 DIAGNOSIS — E10649 Type 1 diabetes mellitus with hypoglycemia without coma: Secondary | ICD-10-CM | POA: Diagnosis not present

## 2023-06-20 NOTE — Telephone Encounter (Signed)
Left message on voicemail to call office.  

## 2023-06-21 DIAGNOSIS — E039 Hypothyroidism, unspecified: Secondary | ICD-10-CM | POA: Diagnosis not present

## 2023-06-21 DIAGNOSIS — K9422 Gastrostomy infection: Secondary | ICD-10-CM | POA: Diagnosis not present

## 2023-06-21 NOTE — Telephone Encounter (Signed)
 Pt was discharged to Skilled Nursing facility.

## 2023-06-21 NOTE — Telephone Encounter (Signed)
 Copied from CRM [id number]. Topic: General - Other >> Jun 20, 2023  4:14 PM Gerardine PARAS wrote: Reason for CRM: Patients representative is requesting PA Lucie Buttner reach out to nursing home patient is currently in as she is being discharged. Patient representative stated he appealed the discharge and it was denied and that he does not feel patient is ready to be discharged and wants Dr. Buttner to reach out to the doctors regarding extending her stay. Pt is unable to turnover, too weak to stand and has a feeding tube in currently, spouse is concerned about how she will be taken care of if discharged. Please reach out to [facility] or nursing home as soon as possible

## 2023-06-21 NOTE — Telephone Encounter (Signed)
 Copied from CRM [id number]. Topic: General - Other >> Jun 20, 2023 11:54 AM Mercedes MATSU wrote: Reason for CRM: Is requesting a call back from PA Lucie Buttner in regards to the patient is in a nursing facility and she is being discharge. Her husband states that he does not feel she is ready to be discharged and wants Dr. Buttner to extend her stay. Pt is unable to turnover, too weak to stand and has a feeding tube still. Husband is upset that she is being released and is requesting a call back asap.

## 2023-06-24 DIAGNOSIS — E039 Hypothyroidism, unspecified: Secondary | ICD-10-CM | POA: Diagnosis not present

## 2023-06-28 DIAGNOSIS — I10 Essential (primary) hypertension: Secondary | ICD-10-CM | POA: Diagnosis not present

## 2023-06-28 DIAGNOSIS — G9341 Metabolic encephalopathy: Secondary | ICD-10-CM | POA: Diagnosis not present

## 2023-06-28 DIAGNOSIS — E108 Type 1 diabetes mellitus with unspecified complications: Secondary | ICD-10-CM | POA: Diagnosis not present

## 2023-06-28 DIAGNOSIS — E440 Moderate protein-calorie malnutrition: Secondary | ICD-10-CM | POA: Diagnosis not present

## 2023-06-30 DIAGNOSIS — Z79899 Other long term (current) drug therapy: Secondary | ICD-10-CM | POA: Diagnosis not present

## 2023-07-08 DIAGNOSIS — S91302A Unspecified open wound, left foot, initial encounter: Secondary | ICD-10-CM | POA: Diagnosis not present

## 2023-07-10 DIAGNOSIS — R0989 Other specified symptoms and signs involving the circulatory and respiratory systems: Secondary | ICD-10-CM | POA: Diagnosis not present

## 2023-07-10 DIAGNOSIS — R059 Cough, unspecified: Secondary | ICD-10-CM | POA: Diagnosis not present

## 2023-07-11 DIAGNOSIS — R0989 Other specified symptoms and signs involving the circulatory and respiratory systems: Secondary | ICD-10-CM | POA: Diagnosis not present

## 2023-07-12 DIAGNOSIS — S91302D Unspecified open wound, left foot, subsequent encounter: Secondary | ICD-10-CM | POA: Diagnosis not present

## 2023-07-12 DIAGNOSIS — I739 Peripheral vascular disease, unspecified: Secondary | ICD-10-CM | POA: Diagnosis not present

## 2023-07-12 DIAGNOSIS — E108 Type 1 diabetes mellitus with unspecified complications: Secondary | ICD-10-CM | POA: Diagnosis not present

## 2023-07-20 DIAGNOSIS — I10 Essential (primary) hypertension: Secondary | ICD-10-CM | POA: Diagnosis not present

## 2023-07-20 DIAGNOSIS — Z713 Dietary counseling and surveillance: Secondary | ICD-10-CM | POA: Diagnosis not present

## 2023-07-20 DIAGNOSIS — R739 Hyperglycemia, unspecified: Secondary | ICD-10-CM | POA: Diagnosis not present

## 2023-07-20 DIAGNOSIS — R627 Adult failure to thrive: Secondary | ICD-10-CM | POA: Diagnosis not present

## 2023-07-25 DIAGNOSIS — D649 Anemia, unspecified: Secondary | ICD-10-CM | POA: Diagnosis not present

## 2023-07-25 DIAGNOSIS — E039 Hypothyroidism, unspecified: Secondary | ICD-10-CM | POA: Diagnosis not present

## 2023-07-25 DIAGNOSIS — E108 Type 1 diabetes mellitus with unspecified complications: Secondary | ICD-10-CM | POA: Diagnosis not present

## 2023-07-27 DIAGNOSIS — R1310 Dysphagia, unspecified: Secondary | ICD-10-CM | POA: Diagnosis not present

## 2023-07-27 DIAGNOSIS — I10 Essential (primary) hypertension: Secondary | ICD-10-CM | POA: Diagnosis not present

## 2023-07-27 DIAGNOSIS — R627 Adult failure to thrive: Secondary | ICD-10-CM | POA: Diagnosis not present

## 2023-07-28 DIAGNOSIS — R627 Adult failure to thrive: Secondary | ICD-10-CM | POA: Diagnosis not present

## 2023-07-28 DIAGNOSIS — E440 Moderate protein-calorie malnutrition: Secondary | ICD-10-CM | POA: Diagnosis not present

## 2023-08-08 ENCOUNTER — Other Ambulatory Visit: Payer: Self-pay

## 2023-08-08 ENCOUNTER — Emergency Department (HOSPITAL_COMMUNITY): Payer: Medicare HMO

## 2023-08-08 ENCOUNTER — Inpatient Hospital Stay (HOSPITAL_COMMUNITY)
Admission: EM | Admit: 2023-08-08 | Discharge: 2023-08-12 | DRG: 853 | Disposition: A | Discharge status: Skilled Nursing Facility | Payer: Medicare HMO | Source: Skilled Nursing Facility | Attending: Internal Medicine | Admitting: Internal Medicine

## 2023-08-08 ENCOUNTER — Encounter (HOSPITAL_COMMUNITY): Payer: Self-pay | Admitting: Emergency Medicine

## 2023-08-08 DIAGNOSIS — A4189 Other specified sepsis: Principal | ICD-10-CM | POA: Diagnosis present

## 2023-08-08 DIAGNOSIS — L89620 Pressure ulcer of left heel, unstageable: Secondary | ICD-10-CM | POA: Diagnosis present

## 2023-08-08 DIAGNOSIS — R339 Retention of urine, unspecified: Secondary | ICD-10-CM | POA: Diagnosis present

## 2023-08-08 DIAGNOSIS — R509 Fever, unspecified: Secondary | ICD-10-CM | POA: Diagnosis not present

## 2023-08-08 DIAGNOSIS — E872 Acidosis, unspecified: Secondary | ICD-10-CM | POA: Diagnosis present

## 2023-08-08 DIAGNOSIS — Z7902 Long term (current) use of antithrombotics/antiplatelets: Secondary | ICD-10-CM

## 2023-08-08 DIAGNOSIS — E1065 Type 1 diabetes mellitus with hyperglycemia: Secondary | ICD-10-CM | POA: Diagnosis present

## 2023-08-08 DIAGNOSIS — Z79890 Hormone replacement therapy (postmenopausal): Secondary | ICD-10-CM

## 2023-08-08 DIAGNOSIS — Z7984 Long term (current) use of oral hypoglycemic drugs: Secondary | ICD-10-CM

## 2023-08-08 DIAGNOSIS — E1052 Type 1 diabetes mellitus with diabetic peripheral angiopathy with gangrene: Secondary | ICD-10-CM | POA: Diagnosis present

## 2023-08-08 DIAGNOSIS — I70201 Unspecified atherosclerosis of native arteries of extremities, right leg: Secondary | ICD-10-CM | POA: Diagnosis present

## 2023-08-08 DIAGNOSIS — Z6826 Body mass index (BMI) 26.0-26.9, adult: Secondary | ICD-10-CM

## 2023-08-08 DIAGNOSIS — R627 Adult failure to thrive: Secondary | ICD-10-CM | POA: Diagnosis present

## 2023-08-08 DIAGNOSIS — N390 Urinary tract infection, site not specified: Secondary | ICD-10-CM | POA: Diagnosis present

## 2023-08-08 DIAGNOSIS — R1084 Generalized abdominal pain: Secondary | ICD-10-CM | POA: Diagnosis not present

## 2023-08-08 DIAGNOSIS — D638 Anemia in other chronic diseases classified elsewhere: Secondary | ICD-10-CM | POA: Diagnosis present

## 2023-08-08 DIAGNOSIS — J9601 Acute respiratory failure with hypoxia: Secondary | ICD-10-CM | POA: Diagnosis not present

## 2023-08-08 DIAGNOSIS — E039 Hypothyroidism, unspecified: Secondary | ICD-10-CM | POA: Diagnosis present

## 2023-08-08 DIAGNOSIS — Z881 Allergy status to other antibiotic agents status: Secondary | ICD-10-CM

## 2023-08-08 DIAGNOSIS — Z981 Arthrodesis status: Secondary | ICD-10-CM

## 2023-08-08 DIAGNOSIS — Z1152 Encounter for screening for COVID-19: Secondary | ICD-10-CM | POA: Diagnosis not present

## 2023-08-08 DIAGNOSIS — I7090 Unspecified atherosclerosis: Secondary | ICD-10-CM | POA: Diagnosis not present

## 2023-08-08 DIAGNOSIS — E103293 Type 1 diabetes mellitus with mild nonproliferative diabetic retinopathy without macular edema, bilateral: Secondary | ICD-10-CM | POA: Diagnosis present

## 2023-08-08 DIAGNOSIS — G9340 Encephalopathy, unspecified: Secondary | ICD-10-CM | POA: Diagnosis present

## 2023-08-08 DIAGNOSIS — J1000 Influenza due to other identified influenza virus with unspecified type of pneumonia: Secondary | ICD-10-CM | POA: Diagnosis present

## 2023-08-08 DIAGNOSIS — G8250 Quadriplegia, unspecified: Secondary | ICD-10-CM | POA: Diagnosis present

## 2023-08-08 DIAGNOSIS — Y846 Urinary catheterization as the cause of abnormal reaction of the patient, or of later complication, without mention of misadventure at the time of the procedure: Secondary | ICD-10-CM | POA: Diagnosis present

## 2023-08-08 DIAGNOSIS — R109 Unspecified abdominal pain: Secondary | ICD-10-CM | POA: Diagnosis not present

## 2023-08-08 DIAGNOSIS — I70234 Atherosclerosis of native arteries of right leg with ulceration of heel and midfoot: Secondary | ICD-10-CM | POA: Diagnosis not present

## 2023-08-08 DIAGNOSIS — R4182 Altered mental status, unspecified: Secondary | ICD-10-CM | POA: Diagnosis not present

## 2023-08-08 DIAGNOSIS — T83511A Infection and inflammatory reaction due to indwelling urethral catheter, initial encounter: Secondary | ICD-10-CM | POA: Diagnosis present

## 2023-08-08 DIAGNOSIS — R404 Transient alteration of awareness: Secondary | ICD-10-CM | POA: Diagnosis not present

## 2023-08-08 DIAGNOSIS — G9341 Metabolic encephalopathy: Secondary | ICD-10-CM | POA: Diagnosis present

## 2023-08-08 DIAGNOSIS — H409 Unspecified glaucoma: Secondary | ICD-10-CM | POA: Diagnosis present

## 2023-08-08 DIAGNOSIS — Z7401 Bed confinement status: Secondary | ICD-10-CM | POA: Diagnosis not present

## 2023-08-08 DIAGNOSIS — I10 Essential (primary) hypertension: Secondary | ICD-10-CM | POA: Diagnosis present

## 2023-08-08 DIAGNOSIS — I96 Gangrene, not elsewhere classified: Secondary | ICD-10-CM

## 2023-08-08 DIAGNOSIS — L89613 Pressure ulcer of right heel, stage 3: Secondary | ICD-10-CM | POA: Diagnosis present

## 2023-08-08 DIAGNOSIS — Z885 Allergy status to narcotic agent status: Secondary | ICD-10-CM

## 2023-08-08 DIAGNOSIS — N3001 Acute cystitis with hematuria: Secondary | ICD-10-CM | POA: Diagnosis not present

## 2023-08-08 DIAGNOSIS — Z833 Family history of diabetes mellitus: Secondary | ICD-10-CM

## 2023-08-08 DIAGNOSIS — I70244 Atherosclerosis of native arteries of left leg with ulceration of heel and midfoot: Secondary | ICD-10-CM | POA: Diagnosis not present

## 2023-08-08 DIAGNOSIS — E512 Wernicke's encephalopathy: Secondary | ICD-10-CM | POA: Diagnosis present

## 2023-08-08 DIAGNOSIS — Z79899 Other long term (current) drug therapy: Secondary | ICD-10-CM

## 2023-08-08 DIAGNOSIS — I70262 Atherosclerosis of native arteries of extremities with gangrene, left leg: Secondary | ICD-10-CM | POA: Diagnosis present

## 2023-08-08 DIAGNOSIS — J101 Influenza due to other identified influenza virus with other respiratory manifestations: Secondary | ICD-10-CM | POA: Diagnosis present

## 2023-08-08 DIAGNOSIS — Z931 Gastrostomy status: Secondary | ICD-10-CM | POA: Diagnosis not present

## 2023-08-08 DIAGNOSIS — I1310 Hypertensive heart and chronic kidney disease without heart failure, with stage 1 through stage 4 chronic kidney disease, or unspecified chronic kidney disease: Secondary | ICD-10-CM | POA: Diagnosis not present

## 2023-08-08 DIAGNOSIS — Z888 Allergy status to other drugs, medicaments and biological substances status: Secondary | ICD-10-CM

## 2023-08-08 DIAGNOSIS — Z794 Long term (current) use of insulin: Secondary | ICD-10-CM

## 2023-08-08 DIAGNOSIS — Z803 Family history of malignant neoplasm of breast: Secondary | ICD-10-CM

## 2023-08-08 DIAGNOSIS — E10621 Type 1 diabetes mellitus with foot ulcer: Secondary | ICD-10-CM | POA: Diagnosis present

## 2023-08-08 DIAGNOSIS — E1021 Type 1 diabetes mellitus with diabetic nephropathy: Secondary | ICD-10-CM | POA: Diagnosis present

## 2023-08-08 DIAGNOSIS — L97522 Non-pressure chronic ulcer of other part of left foot with fat layer exposed: Secondary | ICD-10-CM | POA: Diagnosis present

## 2023-08-08 DIAGNOSIS — R112 Nausea with vomiting, unspecified: Secondary | ICD-10-CM | POA: Diagnosis not present

## 2023-08-08 DIAGNOSIS — G959 Disease of spinal cord, unspecified: Secondary | ICD-10-CM | POA: Diagnosis present

## 2023-08-08 DIAGNOSIS — R531 Weakness: Secondary | ICD-10-CM | POA: Diagnosis not present

## 2023-08-08 DIAGNOSIS — R059 Cough, unspecified: Secondary | ICD-10-CM | POA: Diagnosis not present

## 2023-08-08 DIAGNOSIS — R000 Tachycardia, unspecified: Secondary | ICD-10-CM | POA: Diagnosis not present

## 2023-08-08 DIAGNOSIS — R0902 Hypoxemia: Secondary | ICD-10-CM | POA: Diagnosis not present

## 2023-08-08 DIAGNOSIS — Z7982 Long term (current) use of aspirin: Secondary | ICD-10-CM

## 2023-08-08 DIAGNOSIS — R630 Anorexia: Secondary | ICD-10-CM | POA: Diagnosis not present

## 2023-08-08 DIAGNOSIS — Z880 Allergy status to penicillin: Secondary | ICD-10-CM

## 2023-08-08 DIAGNOSIS — E7800 Pure hypercholesterolemia, unspecified: Secondary | ICD-10-CM | POA: Diagnosis present

## 2023-08-08 LAB — CBC WITH DIFFERENTIAL/PLATELET
Abs Immature Granulocytes: 0.04 10*3/uL (ref 0.00–0.07)
Basophils Absolute: 0 10*3/uL (ref 0.0–0.1)
Basophils Relative: 0 %
Eosinophils Absolute: 0 10*3/uL (ref 0.0–0.5)
Eosinophils Relative: 0 %
HCT: 32.2 % — ABNORMAL LOW (ref 36.0–46.0)
Hemoglobin: 10.2 g/dL — ABNORMAL LOW (ref 12.0–15.0)
Immature Granulocytes: 1 %
Lymphocytes Relative: 14 %
Lymphs Abs: 0.9 10*3/uL (ref 0.7–4.0)
MCH: 26.6 pg (ref 26.0–34.0)
MCHC: 31.7 g/dL (ref 30.0–36.0)
MCV: 83.9 fL (ref 80.0–100.0)
Monocytes Absolute: 0.7 10*3/uL (ref 0.1–1.0)
Monocytes Relative: 11 %
Neutro Abs: 4.8 10*3/uL (ref 1.7–7.7)
Neutrophils Relative %: 74 %
Platelets: 318 10*3/uL (ref 150–400)
RBC: 3.84 MIL/uL — ABNORMAL LOW (ref 3.87–5.11)
RDW: 14.5 % (ref 11.5–15.5)
WBC: 6.3 10*3/uL (ref 4.0–10.5)
nRBC: 0 % (ref 0.0–0.2)

## 2023-08-08 LAB — COMPREHENSIVE METABOLIC PANEL
ALT: 38 U/L (ref 0–44)
AST: 48 U/L — ABNORMAL HIGH (ref 15–41)
Albumin: 2.6 g/dL — ABNORMAL LOW (ref 3.5–5.0)
Alkaline Phosphatase: 88 U/L (ref 38–126)
Anion gap: 12 (ref 5–15)
BUN: 30 mg/dL — ABNORMAL HIGH (ref 8–23)
CO2: 27 mmol/L (ref 22–32)
Calcium: 9.1 mg/dL (ref 8.9–10.3)
Chloride: 96 mmol/L — ABNORMAL LOW (ref 98–111)
Creatinine, Ser: 0.93 mg/dL (ref 0.44–1.00)
GFR, Estimated: >60 mL/min (ref >60)
Glucose, Bld: 144 mg/dL — ABNORMAL HIGH (ref 70–99)
Potassium: 3.9 mmol/L (ref 3.5–5.1)
Sodium: 135 mmol/L (ref 135–145)
Total Bilirubin: 0.5 mg/dL (ref 0.0–1.2)
Total Protein: 7.2 g/dL (ref 6.5–8.1)

## 2023-08-08 LAB — URINALYSIS, W/ REFLEX TO CULTURE (INFECTION SUSPECTED)
Bilirubin Urine: NEGATIVE
Glucose, UA: 150 mg/dL — AB
Ketones, ur: 5 mg/dL — AB
Nitrite: NEGATIVE
Protein, ur: 100 mg/dL — AB
Specific Gravity, Urine: 1.014 (ref 1.005–1.030)
WBC, UA: >50 WBC/hpf (ref 0–5)
pH: 6 (ref 5.0–8.0)

## 2023-08-08 LAB — RESP PANEL BY RT-PCR (RSV, FLU A&B, COVID)  RVPGX2
Influenza A by PCR: POSITIVE — AB
Influenza B by PCR: NEGATIVE
Resp Syncytial Virus by PCR: NEGATIVE
SARS Coronavirus 2 by RT PCR: NEGATIVE

## 2023-08-08 LAB — LACTIC ACID, PLASMA: Lactic Acid, Venous: 2.1 mmol/L (ref 0.5–1.9)

## 2023-08-08 MED ORDER — SODIUM CHLORIDE 0.9 % IV SOLN
1.0000 g | Freq: Once | INTRAVENOUS | Status: AC
  Administered 2023-08-08: 1 g via INTRAVENOUS
  Filled 2023-08-08: qty 10

## 2023-08-08 MED ORDER — SODIUM CHLORIDE 0.9 % IV BOLUS
1000.0000 mL | Freq: Once | INTRAVENOUS | Status: AC
Start: 2023-08-08 — End: 2023-08-09
  Administered 2023-08-08: 1000 mL via INTRAVENOUS

## 2023-08-08 NOTE — ED Notes (Signed)
 [place] PIV placed.

## 2023-08-08 NOTE — ED Triage Notes (Signed)
Pt arrives via EMS from [facility] for AMS, last normal on Friday. Recently dx pneumonia.

## 2023-08-08 NOTE — ED Provider Notes (Signed)
[place] [facility]ERGENC[facility] DEPARTMENT AT [facility] Provider Note   CSN: [id number] Arrival date & time: 08/08/23  2055     History  Chief Complaint  Patient presents with   Altered Mental Status    Katherine Navarro is a 69 y.o. female.   Altered Mental Status Presents with fever and mental status change.  Found to have rectal temperature of 101.3.  Reported more confusion.  Patient is somewhat confused.  Recently diagnosed with pneumonia and appears to have had dose of Rocephin ordered initially today.       Home Medications Prior to Admission medications   Medication Sig Start Date End Date Taking? Authorizing Provider  famotidine (PEPCID) 20 MG tablet Take 1 tablet (20 mg total) by mouth daily. 05/20/23   Ghimire, Werner Lean, MD  folic acid (FOLVITE) 1 MG tablet Take 2 tablets (2 mg total) by mouth daily. 05/20/23   Ghimire, Werner Lean, MD  insulin aspart (NOVOLOG) 100 UNIT/ML injection 0-15 Units, Subcutaneous, Every 4 hours, CBG < 70: Implement Hypoglycemia measures CBG 70 - 120: 0 units CBG 121 - 150: 2 units CBG 151 - 200: 3 units CBG 201 - 250: 5 units CBG 251 - 300: 8 units CBG 301 - 350: 11 units CBG 351 - 400: 15 units CBG > 400: call MD and obtain STAT lab verification 05/20/23   Maretta Bees, MD  insulin glargine-yfgn (SEMGLEE) 100 UNIT/ML injection Inject 0.16 mLs (16 Units total) into the skin daily. 05/20/23   Ghimire, Werner Lean, MD  lactose free nutrition (BOOST PLUS) LIQD Take 237 mLs by mouth 3 (three) times daily with meals. 05/20/23   Ghimire, Werner Lean, MD  levothyroxine (SYNTHROID) 75 MCG tablet Take 1 tablet (75 mcg total) by mouth daily at 6 (six) AM. 05/21/23   Ghimire, Werner Lean, MD  multivitamin (PROSIGHT) TABS tablet Take 1 tablet by mouth daily. 05/20/23   Ghimire, Werner Lean, MD  Nutritional Supplements (FEEDING SUPPLEMENT, GLUCERNA 1.5 CAL,) LIQD 65 mL/hr, Continuous, Starting at 1800 daily for 12 Hours 05/20/23   Ghimire, Werner Lean, MD   polyethylene glycol (MIRALAX / GLYCOLAX) 17 g packet Take 17 g by mouth 2 (two) times daily. 05/20/23   Ghimire, Werner Lean, MD  Protein (FEEDING SUPPLEMENT, PROSOURCE HY86,) liquid Place 60 mLs into feeding tube 2 (two) times daily. 05/20/23   Ghimire, Werner Lean, MD  tamsulosin (FLOMAX) 0.4 MG CAPS capsule Take 1 capsule (0.4 mg total) by mouth daily. 05/20/23   Ghimire, Werner Lean, MD  thiamine (VITAMIN B-1) 100 MG tablet Take 1 tablet (100 mg total) by mouth daily. 05/20/23   Ghimire, Werner Lean, MD  Water For Irrigation, Sterile (FREE WATER) SOLN Place 400 mLs into feeding tube every 4 (four) hours. 05/20/23   Ghimire, Werner Lean, MD      Allergies    Atorvastatin, Ciprofloxacin, Epinephrine, Erythromycin, Morphine, Peanut-containing drug products, and Penicillins    Review of Systems   Review of Systems  Physical Exam Updated Vital Signs BP 123/61   Pulse 95   Temp (!) 101.3 F (38.5 C) (Rectal)   Resp 16   Ht 5\' 3"  (1.6 m)   Wt 67.8 kg   LMP 05/03/2013   SpO2 97%   BMI 26.47 kg/m  Physical Exam Vitals reviewed.  HENT:     Head: Normocephalic and atraumatic.  Cardiovascular:     Rate and Rhythm: Regular rhythm.  Pulmonary:     Comments: Mildly harsh breath sounds. Abdominal:  Tenderness: There is no abdominal tenderness.     Comments: PEG tube in place with no signs of infection.  Genitourinary:    Comments: Per nursing no large wound on sacral area.  Foley catheter in place and was changed. Musculoskeletal:     Cervical back: Neck supple.     Comments: Dressings on bilateral feet.  Skin:    General: Skin is warm.  Neurological:     Mental Status: She is alert.     Comments: Awake and pleasant but some confusion.     ED Results / Procedures / Treatments   Labs (all labs ordered are listed, but only abnormal results are displayed) Labs Reviewed  RESP PANEL BY RT-PCR (RSV, FLU A&B, COVID)  RVPGX2 - Abnormal; Notable for the following components:      Result  Value   Influenza A by PCR POSITIVE (*)    All other components within normal limits  URINALYSIS, W/ REFLEX TO CULTURE (INFECTION SUSPECTED) - Abnormal; Notable for the following components:   APPearance CLOUDY (*)    Glucose, UA 150 (*)    Hgb urine dipstick SMALL (*)    Ketones, ur 5 (*)    Protein, ur 100 (*)    Leukocytes,Ua LARGE (*)    Bacteria, UA FEW (*)    Non Squamous Epithelial 0-5 (*)    All other components within normal limits  COMPREHENSIVE METABOLIC PANEL - Abnormal; Notable for the following components:   Chloride 96 (*)    Glucose, Bld 144 (*)    BUN 30 (*)    Albumin 2.6 (*)    AST 48 (*)    All other components within normal limits  CBC WITH DIFFERENTIAL/PLATELET - Abnormal; Notable for the following components:   RBC 3.84 (*)    Hemoglobin 10.2 (*)    HCT 32.2 (*)    All other components within normal limits  LACTIC ACID, PLASMA - Abnormal; Notable for the following components:   Lactic Acid, Venous 2.1 (*)    All other components within normal limits  CULTURE, BLOOD (ROUTINE X 2)  CULTURE, BLOOD (ROUTINE X 2)  URINE CULTURE  LACTIC ACID, PLASMA  TSH    EKG None  Radiology DG Chest Portable 1 View Result Date: 08/08/2023 CLINICAL DATA:  Fever, cough EXAM: PORTABLE CHEST 1 VIEW COMPARISON:  05/03/2023 FINDINGS: Heart and mediastinal contours are within normal limits. No focal opacities or effusions. No acute bony abnormality. IMPRESSION: No active disease. Electronically Signed   By: Charlett Nose M.D.   On: 08/08/2023 22:15    Procedures Procedures    Medications Ordered in ED Medications  cefTRIAXone (ROCEPHIN) 1 g in sodium chloride 0.9 % 100 mL IVPB (1 g Intravenous New Bag/Given 08/08/23 2303)  sodium chloride 0.9 % bolus 1,000 mL (1,000 mLs Intravenous New Bag/Given 08/08/23 2322)    ED Course/ Medical Decision Making/ A&P                                 Medical Decision Making Amount and/or Complexity of Data Reviewed Labs:  ordered. Radiology: ordered.  Risk Decision regarding hospitalization.   Patient mental status change and fever.  Diagnosed with purulent bronchitis earlier today and given Rocephin IM.  Lung is a potential source but also urine with her indwelling Foley catheter.  No sacral wound but will recheck heels which are dressed.  Reviewed recent discharge notes.  X-ray reassuring.  Urinalysis  does show potential infection.  With infection we will add second gram of Rocephin to the gram IM she had earlier.  Does have some wounds on the heels and left great toe but nothing that appears no infected.   Influenza has now come back positive. Discusse with Dr Lazarus Salines for admission        Final Clinical Impression(s) / ED Diagnoses Final diagnoses:  Urinary tract infection associated with indwelling urethral catheter, initial encounter [facility])  Influenza A    Rx / DC Orders ED Discharge Orders     None         Benjiman Core, MD 08/08/23 2327

## 2023-08-08 NOTE — Progress Notes (Signed)
Responded to consult for IV. 1 site obtained by ED; 2nd site requested. Assessed both arms with [place]. No appropriate site found for IV or midline due to vein depth. Recommend considering central line for additional access needs.

## 2023-08-09 ENCOUNTER — Observation Stay (HOSPITAL_COMMUNITY): Payer: Medicare HMO

## 2023-08-09 ENCOUNTER — Other Ambulatory Visit: Payer: Self-pay

## 2023-08-09 ENCOUNTER — Encounter (HOSPITAL_COMMUNITY): Payer: Self-pay | Admitting: Family Medicine

## 2023-08-09 DIAGNOSIS — J1000 Influenza due to other identified influenza virus with unspecified type of pneumonia: Secondary | ICD-10-CM | POA: Diagnosis present

## 2023-08-09 DIAGNOSIS — I96 Gangrene, not elsewhere classified: Secondary | ICD-10-CM | POA: Diagnosis not present

## 2023-08-09 DIAGNOSIS — G8250 Quadriplegia, unspecified: Secondary | ICD-10-CM | POA: Diagnosis present

## 2023-08-09 DIAGNOSIS — L89620 Pressure ulcer of left heel, unstageable: Secondary | ICD-10-CM | POA: Diagnosis present

## 2023-08-09 DIAGNOSIS — I70262 Atherosclerosis of native arteries of extremities with gangrene, left leg: Secondary | ICD-10-CM | POA: Diagnosis present

## 2023-08-09 DIAGNOSIS — D638 Anemia in other chronic diseases classified elsewhere: Secondary | ICD-10-CM | POA: Diagnosis present

## 2023-08-09 DIAGNOSIS — G9340 Encephalopathy, unspecified: Secondary | ICD-10-CM | POA: Diagnosis not present

## 2023-08-09 DIAGNOSIS — A4189 Other specified sepsis: Secondary | ICD-10-CM | POA: Diagnosis present

## 2023-08-09 DIAGNOSIS — G959 Disease of spinal cord, unspecified: Secondary | ICD-10-CM | POA: Diagnosis present

## 2023-08-09 DIAGNOSIS — I10 Essential (primary) hypertension: Secondary | ICD-10-CM | POA: Diagnosis present

## 2023-08-09 DIAGNOSIS — J101 Influenza due to other identified influenza virus with other respiratory manifestations: Secondary | ICD-10-CM | POA: Diagnosis present

## 2023-08-09 DIAGNOSIS — N390 Urinary tract infection, site not specified: Secondary | ICD-10-CM | POA: Insufficient documentation

## 2023-08-09 DIAGNOSIS — E039 Hypothyroidism, unspecified: Secondary | ICD-10-CM | POA: Diagnosis present

## 2023-08-09 DIAGNOSIS — I7090 Unspecified atherosclerosis: Secondary | ICD-10-CM

## 2023-08-09 DIAGNOSIS — E872 Acidosis, unspecified: Secondary | ICD-10-CM | POA: Diagnosis present

## 2023-08-09 DIAGNOSIS — E512 Wernicke's encephalopathy: Secondary | ICD-10-CM | POA: Diagnosis present

## 2023-08-09 DIAGNOSIS — E1052 Type 1 diabetes mellitus with diabetic peripheral angiopathy with gangrene: Secondary | ICD-10-CM | POA: Diagnosis present

## 2023-08-09 DIAGNOSIS — R4182 Altered mental status, unspecified: Secondary | ICD-10-CM

## 2023-08-09 DIAGNOSIS — Z1152 Encounter for screening for COVID-19: Secondary | ICD-10-CM | POA: Diagnosis not present

## 2023-08-09 DIAGNOSIS — I70234 Atherosclerosis of native arteries of right leg with ulceration of heel and midfoot: Secondary | ICD-10-CM | POA: Diagnosis not present

## 2023-08-09 DIAGNOSIS — E1065 Type 1 diabetes mellitus with hyperglycemia: Secondary | ICD-10-CM | POA: Diagnosis present

## 2023-08-09 DIAGNOSIS — E10621 Type 1 diabetes mellitus with foot ulcer: Secondary | ICD-10-CM | POA: Diagnosis present

## 2023-08-09 DIAGNOSIS — Z794 Long term (current) use of insulin: Secondary | ICD-10-CM | POA: Diagnosis not present

## 2023-08-09 DIAGNOSIS — Z931 Gastrostomy status: Secondary | ICD-10-CM | POA: Diagnosis not present

## 2023-08-09 DIAGNOSIS — T83511A Infection and inflammatory reaction due to indwelling urethral catheter, initial encounter: Secondary | ICD-10-CM | POA: Diagnosis present

## 2023-08-09 DIAGNOSIS — N3001 Acute cystitis with hematuria: Secondary | ICD-10-CM | POA: Diagnosis not present

## 2023-08-09 DIAGNOSIS — E103293 Type 1 diabetes mellitus with mild nonproliferative diabetic retinopathy without macular edema, bilateral: Secondary | ICD-10-CM | POA: Diagnosis present

## 2023-08-09 DIAGNOSIS — E1021 Type 1 diabetes mellitus with diabetic nephropathy: Secondary | ICD-10-CM | POA: Diagnosis present

## 2023-08-09 DIAGNOSIS — I70244 Atherosclerosis of native arteries of left leg with ulceration of heel and midfoot: Secondary | ICD-10-CM | POA: Diagnosis not present

## 2023-08-09 DIAGNOSIS — L89613 Pressure ulcer of right heel, stage 3: Secondary | ICD-10-CM | POA: Diagnosis present

## 2023-08-09 DIAGNOSIS — L97522 Non-pressure chronic ulcer of other part of left foot with fat layer exposed: Secondary | ICD-10-CM | POA: Diagnosis present

## 2023-08-09 DIAGNOSIS — G9341 Metabolic encephalopathy: Secondary | ICD-10-CM | POA: Diagnosis present

## 2023-08-09 DIAGNOSIS — Y846 Urinary catheterization as the cause of abnormal reaction of the patient, or of later complication, without mention of misadventure at the time of the procedure: Secondary | ICD-10-CM | POA: Diagnosis present

## 2023-08-09 LAB — VAS US ABI WITH/WO TBI
Left ABI: 0.75
Right ABI: 1

## 2023-08-09 LAB — LACTIC ACID, PLASMA: Lactic Acid, Venous: 1.8 mmol/L (ref 0.5–1.9)

## 2023-08-09 LAB — CBC
HCT: 29.9 % — ABNORMAL LOW (ref 36.0–46.0)
Hemoglobin: 9.5 g/dL — ABNORMAL LOW (ref 12.0–15.0)
MCH: 26.8 pg (ref 26.0–34.0)
MCHC: 31.8 g/dL (ref 30.0–36.0)
MCV: 84.5 fL (ref 80.0–100.0)
Platelets: 287 10*3/uL (ref 150–400)
RBC: 3.54 MIL/uL — ABNORMAL LOW (ref 3.87–5.11)
RDW: 14.3 % (ref 11.5–15.5)
WBC: 6.4 10*3/uL (ref 4.0–10.5)
nRBC: 0 % (ref 0.0–0.2)

## 2023-08-09 LAB — PHOSPHORUS: Phosphorus: 3.2 mg/dL (ref 2.5–4.6)

## 2023-08-09 LAB — BASIC METABOLIC PANEL
Anion gap: 11 (ref 5–15)
BUN: 27 mg/dL — ABNORMAL HIGH (ref 8–23)
CO2: 25 mmol/L (ref 22–32)
Calcium: 8.5 mg/dL — ABNORMAL LOW (ref 8.9–10.3)
Chloride: 98 mmol/L (ref 98–111)
Creatinine, Ser: 0.82 mg/dL (ref 0.44–1.00)
GFR, Estimated: >60 mL/min (ref >60)
Glucose, Bld: 102 mg/dL — ABNORMAL HIGH (ref 70–99)
Potassium: 3.3 mmol/L — ABNORMAL LOW (ref 3.5–5.1)
Sodium: 134 mmol/L — ABNORMAL LOW (ref 135–145)

## 2023-08-09 LAB — GLUCOSE, CAPILLARY
Glucose-Capillary: 308 mg/dL — ABNORMAL HIGH (ref 70–99)
Glucose-Capillary: 276 mg/dL — ABNORMAL HIGH (ref 70–99)

## 2023-08-09 LAB — TSH: TSH: 3.617 u[IU]/mL (ref 0.350–4.500)

## 2023-08-09 LAB — CBG MONITORING, ED
Glucose-Capillary: 88 mg/dL (ref 70–99)
Glucose-Capillary: 110 mg/dL — ABNORMAL HIGH (ref 70–99)

## 2023-08-09 LAB — MAGNESIUM: Magnesium: 1.6 mg/dL — ABNORMAL LOW (ref 1.7–2.4)

## 2023-08-09 MED ORDER — ORAL CARE MOUTH RINSE: 15.0000 mL | OROMUCOSAL | Status: DC | PRN

## 2023-08-09 MED ORDER — ASPIRIN 81 MG PO CHEW
81.0000 mg | CHEWABLE_TABLET | Freq: Every day | ORAL | Status: DC
  Administered 2023-08-09 – 2023-08-12 (×4): 81 mg
  Filled 2023-08-09 (×4): qty 1

## 2023-08-09 MED ORDER — SODIUM CHLORIDE 0.9% FLUSH
3.0000 mL | Freq: Two times a day (BID) | INTRAVENOUS | Status: DC
  Administered 2023-08-09 – 2023-08-12 (×6): 3 mL via INTRAVENOUS

## 2023-08-09 MED ORDER — OSELTAMIVIR PHOSPHATE 6 MG/ML PO SUSR
75.0000 mg | Freq: Two times a day (BID) | ORAL | Status: DC
  Administered 2023-08-09 – 2023-08-12 (×6): 75 mg
  Filled 2023-08-09 (×10): qty 12.5

## 2023-08-09 MED ORDER — MELATONIN 3 MG PO TABS
6.0000 mg | ORAL_TABLET | Freq: Every evening | ORAL | Status: DC | PRN
Start: 2023-08-09 — End: 2023-08-12

## 2023-08-09 MED ORDER — ONDANSETRON HCL 4 MG/2ML IJ SOLN: 4.0000 mg | Freq: Four times a day (QID) | INTRAMUSCULAR | Status: DC | PRN

## 2023-08-09 MED ORDER — ENOXAPARIN SODIUM 40 MG/0.4ML IJ SOSY
40.0000 mg | PREFILLED_SYRINGE | INTRAMUSCULAR | Status: DC
  Administered 2023-08-09 – 2023-08-10 (×2): 40 mg via SUBCUTANEOUS
  Filled 2023-08-09 (×2): qty 0.4

## 2023-08-09 MED ORDER — POTASSIUM CHLORIDE 20 MEQ PO PACK
40.0000 meq | PACK | Freq: Once | ORAL | Status: AC
  Administered 2023-08-09: 40 meq
  Filled 2023-08-09: qty 2

## 2023-08-09 MED ORDER — OSELTAMIVIR PHOSPHATE 75 MG PO CAPS
75.0000 mg | ORAL_CAPSULE | Freq: Once | ORAL | Status: AC
  Administered 2023-08-09: 75 mg
  Filled 2023-08-09: qty 1

## 2023-08-09 MED ORDER — THIAMINE MONONITRATE 100 MG PO TABS
100.0000 mg | ORAL_TABLET | Freq: Every day | ORAL | Status: DC
  Administered 2023-08-09 – 2023-08-12 (×4): 100 mg
  Filled 2023-08-09 (×4): qty 1

## 2023-08-09 MED ORDER — SODIUM CHLORIDE 0.9 % IV SOLN
1.0000 g | Freq: Every day | INTRAVENOUS | Status: AC
  Administered 2023-08-09 – 2023-08-11 (×2): 1 g via INTRAVENOUS
  Filled 2023-08-09 (×3): qty 10

## 2023-08-09 MED ORDER — LEVOTHYROXINE SODIUM 75 MCG PO TABS
150.0000 ug | ORAL_TABLET | Freq: Every day | ORAL | Status: DC
  Administered 2023-08-09 – 2023-08-12 (×3): 150 ug
  Filled 2023-08-09 (×2): qty 2
  Filled 2023-08-09: qty 1

## 2023-08-09 MED ORDER — ALBUTEROL SULFATE (2.5 MG/3ML) 0.083% IN NEBU: 2.5000 mg | INHALATION_SOLUTION | RESPIRATORY_TRACT | Status: DC | PRN

## 2023-08-09 MED ORDER — POLYETHYLENE GLYCOL 3350 17 G PO PACK: 17.0000 g | PACK | Freq: Every day | ORAL | Status: DC | PRN

## 2023-08-09 MED ORDER — INSULIN GLARGINE-YFGN 100 UNIT/ML ~~LOC~~ SOLN
24.0000 [IU] | Freq: Every day | SUBCUTANEOUS | Status: DC
Start: 2023-08-09 — End: 2023-08-11
  Administered 2023-08-10 – 2023-08-11 (×2): 24 [IU] via SUBCUTANEOUS
  Filled 2023-08-09 (×3): qty 0.24

## 2023-08-09 MED ORDER — BOOST PLUS PO LIQD
237.0000 mL | Freq: Three times a day (TID) | ORAL | Status: DC
  Administered 2023-08-09 – 2023-08-10 (×2): 237 mL
  Filled 2023-08-09 (×6): qty 237
  Filled 2023-08-09: qty 711
  Filled 2023-08-09: qty 237

## 2023-08-09 MED ORDER — POTASSIUM CHLORIDE CRYS ER 20 MEQ PO TBCR: 40.0000 meq | EXTENDED_RELEASE_TABLET | Freq: Once | ORAL | Status: DC

## 2023-08-09 MED ORDER — FOLIC ACID 1 MG PO TABS
1.0000 mg | ORAL_TABLET | Freq: Every day | ORAL | Status: DC
  Administered 2023-08-09 – 2023-08-12 (×4): 1 mg
  Filled 2023-08-09 (×4): qty 1

## 2023-08-09 MED ORDER — CHLORHEXIDINE GLUCONATE CLOTH 2 % EX PADS
6.0000 | MEDICATED_PAD | Freq: Every day | CUTANEOUS | Status: DC
  Administered 2023-08-09 – 2023-08-12 (×4): 6 via TOPICAL

## 2023-08-09 MED ORDER — INSULIN GLARGINE-YFGN 100 UNIT/ML ~~LOC~~ SOLN
16.0000 [IU] | Freq: Every day | SUBCUTANEOUS | Status: DC
  Filled 2023-08-09: qty 0.16

## 2023-08-09 MED ORDER — FAMOTIDINE 20 MG PO TABS
20.0000 mg | ORAL_TABLET | Freq: Every day | ORAL | Status: DC
  Administered 2023-08-09 – 2023-08-12 (×4): 20 mg
  Filled 2023-08-09 (×4): qty 1

## 2023-08-09 MED ORDER — ACETAMINOPHEN 500 MG PO TABS: 1000.0000 mg | ORAL_TABLET | Freq: Four times a day (QID) | ORAL | Status: DC | PRN

## 2023-08-09 MED ORDER — FREE WATER
400.0000 mL | Freq: Four times a day (QID) | Status: DC
  Administered 2023-08-09 – 2023-08-10 (×5): 400 mL

## 2023-08-09 MED ORDER — OSELTAMIVIR PHOSPHATE 30 MG PO CAPS: 30.0000 mg | ORAL_CAPSULE | Freq: Two times a day (BID) | ORAL | Status: DC

## 2023-08-09 MED ORDER — INSULIN ASPART 100 UNIT/ML IJ SOLN
0.0000 [IU] | Freq: Three times a day (TID) | INTRAMUSCULAR | Status: DC
  Administered 2023-08-09: 4 [IU] via SUBCUTANEOUS
  Administered 2023-08-10: 1 [IU] via SUBCUTANEOUS
  Administered 2023-08-10: 3 [IU] via SUBCUTANEOUS
  Administered 2023-08-10: 4 [IU] via SUBCUTANEOUS
  Administered 2023-08-11: 1 [IU] via SUBCUTANEOUS
  Administered 2023-08-11: 2 [IU] via SUBCUTANEOUS
  Administered 2023-08-11: 1 [IU] via SUBCUTANEOUS
  Administered 2023-08-12: 5 [IU] via SUBCUTANEOUS
  Filled 2023-08-09: qty 0.06

## 2023-08-09 NOTE — Progress Notes (Signed)
ABI's have been completed. Preliminary results can be found in CV Proc through chart review.   08/09/23 9:19 AM Olen Cordial RVT

## 2023-08-09 NOTE — Plan of Care (Signed)
  Problem: Education: Goal: Ability to describe self-care measures that may prevent or decrease complications (Diabetes Survival Skills Education) will improve Outcome: Progressing   Problem: Coping: Goal: Ability to adjust to condition or change in health will improve Outcome: Progressing   Problem: Skin Integrity: Goal: Risk for impaired skin integrity will decrease Outcome: Progressing

## 2023-08-09 NOTE — Consult Note (Signed)
Please note that the [facility] nursing team is utilizing a standardized work plan to manage patient consults. We are triaging consults and will try to see the patients within 48 hours. Wound photos in the patient's chart allow [place] to consult on the patient in the most efficient and timely manner.    Guneet Delpino [facility], CNS, [employer] [phone]

## 2023-08-09 NOTE — Progress Notes (Signed)
   08/09/23 1621  Assess: MEWS Score  Temp 98.1 F (36.7 C)  BP (!) 142/76  MAP (mmHg) 96  Pulse Rate (!) 103  Resp 18  SpO2 100 %  O2 Device Nasal Cannula  O2 Flow Rate (L/min) 2 L/min  Assess: MEWS Score  MEWS Temp 0  MEWS Systolic 0  MEWS Pulse 1  MEWS RR 0  MEWS LOC 2  MEWS Score 3  MEWS Score Color Yellow  Assess: if the MEWS score is Yellow or Red  Were vital signs accurate and taken at a resting state? Yes  Does the patient meet 2 or more of the SIRS criteria? Yes  Does the patient have a confirmed or suspected source of infection? Yes  MEWS guidelines implemented  Yes, yellow  Treat  MEWS Interventions Considered administering scheduled or prn medications/treatments as ordered  Take Vital Signs  Increase Vital Sign Frequency  Yellow: Q2hr x1, continue Q4hrs until patient remains green for 12hrs  Escalate  MEWS: Escalate Yellow: Discuss with charge nurse and consider notifying provider and/or RRT  Notify: Charge Nurse/RN  Name of Charge Nurse/RN Notified Quarry manager  Provider Notification  Provider Name/Title [place]  Date Provider Notified 08/09/23  Time Provider Notified 1621  Notification Reason Change in status  Provider response No new orders  Assess: SIRS CRITERIA  SIRS Temperature  0  SIRS Respirations  0  SIRS Pulse 1  SIRS WBC 0  SIRS Score Sum  1   New admission from [facility], Yellow [place] on admission. Provider notified, monitoring per protocol

## 2023-08-09 NOTE — Progress Notes (Signed)
PHARMACY NOTE:  ANTIMICROBIAL RENAL DOSAGE ADJUSTMENT  Current antimicrobial regimen includes a mismatch between antimicrobial dosage and estimated renal function.  As per policy approved by the Pharmacy & Therapeutics and Medical Executive Committees, the antimicrobial dosage will be adjusted accordingly.  Current antimicrobial dosage: Oseltamivir 30mg  per tube BID x 5 days  Indication: Influenza A  Renal Function:  Estimated Creatinine Clearance: 63.3 mL/min (by C-G formula based on SCr of 0.82 mg/dL). []      On intermittent HD, scheduled: []      On CRRT    Antimicrobial dosage has been changed to:  Oseltamivir 75mg  per tube BID x 5 days    Thank you for allowing pharmacy to be a part of this patient's care.  Jamse Mead, [facility] 08/09/2023 1:46 PM

## 2023-08-09 NOTE — H&P (Signed)
History and Physical    Katherine Navarro IHK:[id number] DOB: [birth year] DOA: 08/08/2023  PCP: Jarold Motto, PA   Patient coming from: [facility]    Chief Complaint:  Chief Complaint  Patient presents with   Altered Mental Status    HPI: History limited due to encephalopathy [name] is a 68 y.o. female with hx of recent admission 11/1-11/29 for severe hypothyroidism /myxedema, wernickes, and failure to thrive requiring PEG tube placement, urinary retention now with chronic foley; additional hx including diabetes type 1, hypertension, hypothyroidism, PAD, cervical myelopathy, anemia, who was brought in from [facility] due to altered mental status and fever.   On interview she is unable to provide any meaningful history.  Thinks she is back in the nursing home.  Notable cough during our encounter.  No outward complaints   Review of Systems:  ROS unable to complete due to her encephalopathy  Allergies  Allergen Reactions   Atorvastatin     REACTION: perceived myalgias   Ciprofloxacin Nausea And Vomiting   Epinephrine     REACTION: thyroid problems   Erythromycin     Can not recall   Morphine Nausea Only    Headache   Peanut-Containing Drug Products Hives   Penicillins Hives    Prior to Admission medications   Medication Sig Start Date End Date Taking? Authorizing Provider  famotidine (PEPCID) 20 MG tablet Take 1 tablet (20 mg total) by mouth daily. 05/20/23   Ghimire, Werner Lean, MD  folic acid (FOLVITE) 1 MG tablet Take 2 tablets (2 mg total) by mouth daily. 05/20/23   Ghimire, Werner Lean, MD  insulin aspart (NOVOLOG) 100 UNIT/ML injection 0-15 Units, Subcutaneous, Every 4 hours, CBG < 70: Implement Hypoglycemia measures CBG 70 - 120: 0 units CBG 121 - 150: 2 units CBG 151 - 200: 3 units CBG 201 - 250: 5 units CBG 251 - 300: 8 units CBG 301 - 350: 11 units CBG 351 - 400: 15 units CBG > 400: call MD and obtain STAT lab verification 05/20/23   Maretta Bees, MD  insulin glargine-yfgn (SEMGLEE) 100 UNIT/ML injection Inject 0.16 mLs (16 Units total) into the skin daily. 05/20/23   Ghimire, Werner Lean, MD  lactose free nutrition (BOOST PLUS) LIQD Take 237 mLs by mouth 3 (three) times daily with meals. 05/20/23   Ghimire, Werner Lean, MD  levothyroxine (SYNTHROID) 75 MCG tablet Take 1 tablet (75 mcg total) by mouth daily at 6 (six) AM. 05/21/23   Ghimire, Werner Lean, MD  multivitamin (PROSIGHT) TABS tablet Take 1 tablet by mouth daily. 05/20/23   Ghimire, Werner Lean, MD  Nutritional Supplements (FEEDING SUPPLEMENT, GLUCERNA 1.5 CAL,) LIQD 65 mL/hr, Continuous, Starting at 1800 daily for 12 Hours 05/20/23   Ghimire, Werner Lean, MD  polyethylene glycol (MIRALAX / GLYCOLAX) 17 g packet Take 17 g by mouth 2 (two) times daily. 05/20/23   Ghimire, Werner Lean, MD  Protein (FEEDING SUPPLEMENT, PROSOURCE VF64,) liquid Place 60 mLs into feeding tube 2 (two) times daily. 05/20/23   Ghimire, Werner Lean, MD  tamsulosin (FLOMAX) 0.4 MG CAPS capsule Take 1 capsule (0.4 mg total) by mouth daily. 05/20/23   Ghimire, Werner Lean, MD  thiamine (VITAMIN B-1) 100 MG tablet Take 1 tablet (100 mg total) by mouth daily. 05/20/23   Ghimire, Werner Lean, MD  Water For Irrigation, Sterile (FREE WATER) SOLN Place 400 mLs into feeding tube every 4 (four) hours. 05/20/23   Ghimire, Werner Lean, MD  Past Medical History:  Diagnosis Date   Complication of anesthesia    trouble waking up   CONSTIPATION, CHRONIC 12/13/2007   DIABETES MELLITUS, TYPE I 01/07/2007   DM nephropathy/sclerosis    GLAUCOMA 12/13/2007   HYPERCHOLESTEROLEMIA 12/13/2007   Hypertension    Hyperthyroidism    INTERNAL HEMORRHOIDS 07/23/2008   LEUKOPENIA, CHRONIC 12/13/2007   Nonproliferative diabetic retinopathy NOS(362.03) 12/13/2007   Unspecified hypothyroidism 12/13/2007   VARICOSE VEINS, LOWER EXTREMITIES 12/13/2007    Past Surgical History:  Procedure Laterality Date   ANTERIOR CERVICAL DECOMP/DISCECTOMY  FUSION N/A 02/06/2020   Procedure: Anterior Cervical Decompression Discectomy Fusion Cervical five-six;  Surgeon: Donalee Citrin, MD;  Location: [facility] OR;  Service: Neurosurgery;  Laterality: N/A;   ELECTROCARDIOGRAM  08/24/2006   EYE SURGERY     bilateral   IR GASTROSTOMY TUBE MOD SED  05/18/2023   LEEP N/A 12/01/2012   Procedure: LOOP ELECTROSURGICAL EXCISION PROCEDURE (LEEP);  Surgeon: Purcell Nails, MD;  Location: [facility] ORS;  Service: Gynecology;  Laterality: N/A;   Stress Myoview   05/18/2004   TUBAL LIGATION       reports that she has never smoked. She has never used smokeless tobacco. She reports current alcohol use of about 1.0 standard drink of alcohol per week. She reports that she does not use drugs.  Family History  Problem Relation Age of Onset   Cancer Mother        Breast Cancer   Diabetes Mother    Breast cancer Mother    Diabetes Father      Physical Exam: Vitals:   08/09/23 0200 08/09/23 0230 08/09/23 0400 08/09/23 0450  BP: (!) 106/54 (!) 102/56 104/60 111/61  Pulse: 84 88 90 81  Resp: 16 16 14 12   Temp:      TempSrc:      SpO2: 100% 100% 100% 97%  Weight:      Height:        Gen: Awake, alert, chronically ill appearing  HEENT: R pupillary defect with minimal reaction, L partial corneal opacification + pupillary defect nonreactive.  CV: Regular, normal S1, S2, no murmurs. Nonpalpable pedal pulses  Resp: Normal WOB, slight wheeze in the R base. Clear anteriorly  Abd: Flat, normoactive, nontender. PEG in the LUQ  MSK: Symmetric, there is trace edema distally.  Skin: There is an ulceration at the distal 1st toe on the R. Flaking / dry skin on LE and feet. No other rashes or lesions to exposed skin  Neuro: Alert and interactive, oriented to person only, ("Were in the nursing home", "1990..."). CN limited, see pupils in HEENT chronic changes with minimally reactive on the R, nonreactive L. No facial droop. Motor 1/5 strength in all extremities. Sensation appears intact  and equal to fine touch.  Psych: Encephalopathic    Data review:   Labs reviewed, notable for:   Lactate 2.1 -> 1.8 TSH 3.6 WBC 6 Hemoglobin 10 near baseline Influenza A positive UA suggestive of infection  Micro:  Results for orders placed or performed during the hospital encounter of 08/08/23  Resp panel by RT-PCR (RSV, Flu A&B, Covid) Anterior Nasal Swab     Status: Abnormal   Collection Time: 08/08/23 10:25 PM   Specimen: Anterior Nasal Swab  Result Value Ref Range Status   SARS Coronavirus 2 by RT PCR NEGATIVE NEGATIVE Final    Comment: (NOTE) SARS-CoV-2 target nucleic acids are NOT DETECTED.  The SARS-CoV-2 RNA is generally detectable in upper respiratory specimens during the acute phase  of infection. The lowest concentration of SARS-CoV-2 viral copies this assay can detect is 138 copies/mL. A negative result does not preclude SARS-Cov-2 infection and should not be used as the sole basis for treatment or other patient management decisions. A negative result may occur with  improper specimen collection/handling, submission of specimen other than nasopharyngeal swab, presence of viral mutation(s) within the areas targeted by this assay, and inadequate number of viral copies(<138 copies/mL). A negative result must be combined with clinical observations, patient history, and epidemiological information. The expected result is Negative.  Fact Sheet for Patients:  BloggerCourse.com  Fact Sheet for Healthcare Providers:  SeriousBroker.it  This test is no t yet approved or cleared by the [place] FDA and  has been authorized for detection and/or diagnosis of SARS-CoV-2 by FDA under an Emergency Use Authorization (EUA). This EUA will remain  in effect (meaning this test can be used) for the duration of the COVID-19 declaration under Section 564(b)(1) of the Act, 21 U.S.C.section 360bbb-3(b)(1), unless the  authorization is terminated  or revoked sooner.       Influenza A by PCR POSITIVE (A) NEGATIVE Final   Influenza B by PCR NEGATIVE NEGATIVE Final    Comment: (NOTE) The Xpert Xpress SARS-CoV-2/FLU/RSV plus assay is intended as an aid in the diagnosis of influenza from Nasopharyngeal swab specimens and should not be used as a sole basis for treatment. Nasal washings and aspirates are unacceptable for Xpert Xpress SARS-CoV-2/FLU/RSV testing.  Fact Sheet for Patients: BloggerCourse.com  Fact Sheet for Healthcare Providers: SeriousBroker.it  This test is not yet approved or cleared by the [place] FDA and has been authorized for detection and/or diagnosis of SARS-CoV-2 by FDA under an Emergency Use Authorization (EUA). This EUA will remain in effect (meaning this test can be used) for the duration of the COVID-19 declaration under Section 564(b)(1) of the Act, 21 U.S.C. section 360bbb-3(b)(1), unless the authorization is terminated or revoked.     Resp Syncytial Virus by PCR NEGATIVE NEGATIVE Final    Comment: (NOTE) Fact Sheet for Patients: BloggerCourse.com  Fact Sheet for Healthcare Providers: SeriousBroker.it  This test is not yet approved or cleared by the [place] FDA and has been authorized for detection and/or diagnosis of SARS-CoV-2 by FDA under an Emergency Use Authorization (EUA). This EUA will remain in effect (meaning this test can be used) for the duration of the COVID-19 declaration under Section 564(b)(1) of the Act, 21 U.S.C. section 360bbb-3(b)(1), unless the authorization is terminated or revoked.  Performed at [facility], [address] [place], [place] [address]     Imaging reviewed:  DG Chest Portable 1 View Result Date: 08/08/2023 CLINICAL DATA:  Fever, cough EXAM: PORTABLE CHEST 1 VIEW COMPARISON:  05/03/2023  FINDINGS: Heart and mediastinal contours are within normal limits. No focal opacities or effusions. No acute bony abnormality. IMPRESSION: No active disease. Electronically Signed   By: Charlett Nose M.D.   On: 08/08/2023 22:15    ED Course:  Treated with 1 L IV fluid, ceftriaxone 1 g IV   Assessment/Plan:  68 y.o. female with hx recent admission 11/1-11/29 for severe hypothyroidism /myxedema, wernickes, and failure to thrive requiring PEG tube placement, urinary retention now with chronic foley; additional hx including diabetes type 1, hypertension, hypothyroidism, PAD, cervical myelopathy, anemia, who was brought in from [facility] due to altered mental status and fever. Found to have influenza A, also with suspected UTI.   Influenza A  Productive cough on  exam. Febrile to 38.5 on initial ED eval. Desat intermittently on RA while asleep, suspect from OSA otherwise when awake no O2 requirements. CXR with no infiltrates.  -Start Tamiflu -Albuterol as needed, incentive telemetry, flutter valve, chest PT -Keep Yankauer at bedside since she has limited ability to clear secretions  Suspect UTI, catheter related (present on admission) History chronic Foley, exchanged in the ED with subsequent UA suspicious for UTI.  -Continue ceftriaxone 1 g IV every 24 hour, follow-up urine culture  Encephalopathy, infectious/metabolic Unclear baseline after her recovery from prolonged hospitalization last admission.  Currently awake and alert oriented to person only.  Suspect related to above infections. -Management of influenza and UTI per above -Delirium precautions, melatonin nightly  Malnutrition, FTT / PEG management  Unclear how much nutrition by mouth v tube  -For now boost 3 times daily, free water 400 mL every 6 hour via tube -Nutrition consult  Chronic medical problems: Hypothyroidism, with recent severe hypothyroidism/myxedema: TSH within normal limits, continue levothyroxine 150 mcg daily   Hx WE: Continue thiamine daily  Urinary retention with chronic Foley: Foley exchanged in ED  Diabetes type 1: Per med list taking Semglee 32 units in the morning, sliding scale.  For now modest reduction Semglee 24 units, SSI for very sensitive can escalate as needed. Hypertension: Not currently on antihypertensives PAD: Check Doppler, nonpalpable pedal pulses.  Ulcer at the distal left first digit possibly vascular related. Check ABI. Wound care for the ulcer.  Cervical myelopathy: Appears to have quadriparesis on exam, unknown if related to this past history versus possibly other process like critical illness myopathy from her recent hospitalization.  Anemia: Likely from chronic disease. Recent iron indicies, B12/folate wnl Hemoglobin stable at 10   Body mass index is 26.47 kg/m.    DVT prophylaxis:  Lovenox Code Status:  Full Code; Reviewed facility record  Diet:  Diet Orders (From admission, onward)    None      Family Communication:  No   Consults:  None   Admission status:   Observation, Telemetry bed  Severity of Illness: The appropriate patient status for this patient is OBSERVATION. Observation status is judged to be reasonable and necessary in order to provide the required intensity of service to ensure the patient's safety. The patient's presenting symptoms, physical exam findings, and initial radiographic and laboratory data in the context of their medical condition is felt to place them at decreased risk for further clinical deterioration. Furthermore, it is anticipated that the patient will be medically stable for discharge from the hospital within 2 midnights of admission.    Dolly Rias, MD [facility]  How to contact the [facility] Attending or Consulting provider 7A - 7P or covering provider during after hours 7P -7A, for this patient.  Check the care team in [facility] and look for a) attending/consulting [facility] provider listed and b) the [facility] team listed Log into  [email or web address] and use [facility] universal password to access. If you do not have the password, please contact the hospital operator. Locate the [facility] provider you are looking for under [facility] and page to a number that you can be directly reached. If you still have difficulty reaching the provider, please page the [facility] (Director on Call) for the Hospitalists listed on amion for assistance.  08/09/2023, 5:41 AM

## 2023-08-09 NOTE — Progress Notes (Signed)
Subjective: Patient admitted this morning, see detailed H&P by Dr Lazarus Salines  68 y.o. female with hx of recent admission 11/1-11/29 for severe hypothyroidism /myxedema, wernickes, and failure to thrive requiring PEG tube placement, urinary retention now with chronic foley; additional hx including diabetes type 1, hypertension, hypothyroidism, PAD, cervical myelopathy, anemia, who was brought in from [facility] due to altered mental status and fever.   Vitals:   08/09/23 0552 08/09/23 0600  BP:  127/64  Pulse: 89 94  Resp: 10 18  Temp: 98.2 F (36.8 C)   SpO2: 98% 95%      A/P  Influenza A  - cont tamiflu -Albuterol as needed, incentive telemetry, flutter valve, chest PT    Suspect UTI, catheter related (present on admission) Historyof  chronic Foley, exchanged in the ED with subsequent UA suspicious for UTI.  -Continue ceftriaxone 1 g IV every 24 hour, follow-up urine culture   Encephalopathy, infectious/metabolic Unclear baseline after her recovery from prolonged hospitalization last admission.   -Management of influenza and UTI per above -Delirium precautions, melatonin nightly   Malnutrition, FTT / PEG management  Unclear how much nutrition by mouth v tube  -For now boost 3 times daily, free water 400 mL every 6 hour via tube -Nutrition consult    Hypothyroidism, with recent severe hypothyroidism/myxedema:  TSH within normal limits, continue levothyroxine 150 mcg daily   Hx Wernicke's encephalopathy - Continue thiamine daily  Urinary retention with chronic Foley: Foley exchanged in ED   Diabetes type 1:  Semglee 24 units, SSI for very sensitive can escalate as needed.        PAD: Check Doppler, nonpalpable pedal pulses.  Ulcer at the distal left first digit; ABI shows moderate to severe PAD and left lower extremity.  Called and discussed with vascular surgery, recommend to transfer to [facility].  Wound care consulted.  Will follow recommendations.       Anemia:  Likely from chronic disease. Recent iron indicies, B12/folate wnl Hemoglobin stable at 10     Katherine Navarro [facility]

## 2023-08-09 NOTE — Consult Note (Signed)
Hospital Consult    Reason for Consult: Bilateral lower extremity wounds Referring Physician: Dr. Sharl Ma MRN #:  [id number]  History of Present Illness: This is a 68 y.o. female without significant vascular history currently in a nursing facility after admission for hypothyroidism in November.  Patient appears to be a limited historian I was able to reach her husband via telephone he states that she has not walked since November but she has been able to stand with assistance.  She is now admitted with fevers and altered mental status diagnosed with influenza.  Per her record she takes aspirin and no other blood thinners.  Past Medical History:  Diagnosis Date   Complication of anesthesia    trouble waking up   CONSTIPATION, CHRONIC 12/13/2007   DIABETES MELLITUS, TYPE I 01/07/2007   GLAUCOMA 12/13/2007   HYPERCHOLESTEROLEMIA 12/13/2007   Hypertension    Hyperthyroidism    INTERNAL HEMORRHOIDS 07/23/2008   LEUKOPENIA, CHRONIC 12/13/2007   Nonproliferative diabetic retinopathy NOS(362.03) 12/13/2007   Unspecified hypothyroidism 12/13/2007   VARICOSE VEINS, LOWER EXTREMITIES 12/13/2007    Past Surgical History:  Procedure Laterality Date   ANTERIOR CERVICAL DECOMP/DISCECTOMY FUSION N/A 02/06/2020   Procedure: Anterior Cervical Decompression Discectomy Fusion Cervical five-six;  Surgeon: Donalee Citrin, MD;  Location: [facility] OR;  Service: Neurosurgery;  Laterality: N/A;   ELECTROCARDIOGRAM  08/24/2006   EYE SURGERY     bilateral   IR GASTROSTOMY TUBE MOD SED  05/18/2023   LEEP N/A 12/01/2012   Procedure: LOOP ELECTROSURGICAL EXCISION PROCEDURE (LEEP);  Surgeon: Purcell Nails, MD;  Location: [facility] ORS;  Service: Gynecology;  Laterality: N/A;   Stress Myoview   05/18/2004   TUBAL LIGATION      Allergies  Allergen Reactions   Atorvastatin     REACTION: perceived myalgias   Ciprofloxacin Nausea And Vomiting   Epinephrine     REACTION: thyroid problems   Erythromycin     Can not recall    Morphine Nausea Only    Headache   Peanut-Containing Drug Products Hives   Penicillins Hives    Prior to Admission medications   Medication Sig Start Date End Date Taking? Authorizing Provider  aspirin 81 MG chewable tablet Place 81 mg into feeding tube daily.   Yes [provider]  famotidine (PEPCID) 20 MG tablet Take 1 tablet (20 mg total) by mouth daily. Patient taking differently: Place 20 mg into feeding tube daily. 05/20/23  Yes Ghimire, Werner Lean, MD  folic acid (FOLVITE) 1 MG tablet Take 2 tablets (2 mg total) by mouth daily. Patient taking differently: Place 1 mg into feeding tube daily. 05/20/23  Yes Ghimire, Werner Lean, MD  insulin aspart (NOVOLOG) 100 UNIT/ML injection 0-15 Units, Subcutaneous, Every 4 hours, CBG < 70: Implement Hypoglycemia measures CBG 70 - 120: 0 units CBG 121 - 150: 2 units CBG 151 - 200: 3 units CBG 201 - 250: 5 units CBG 251 - 300: 8 units CBG 301 - 350: 11 units CBG 351 - 400: 15 units CBG > 400: call MD and obtain STAT lab verification Patient taking differently: Inject 0-17 Units into the skin every 4 (four) hours. 0-15 Units, Subcutaneous, Every 4 hours, CBG < 70: Implement Hypoglycemia measures CBG 70 - 120: 0 units CBG 121 - 150: 2 units CBG 151 - 200: 3 units CBG 201 - 250: 5 units CBG 251 - 300: 8 units CBG 301 - 350: 11 units CBG 351 - 400: 15 units CBG > 400: call MD and obtain  STAT lab verification 05/20/23  Yes Ghimire, Werner Lean, MD  Insulin Glargine (BASAGLAR KWIKPEN) 100 UNIT/ML Inject 32 Units into the skin daily. 07/25/23  Yes [provider]  lactose free nutrition (BOOST PLUS) LIQD Take 237 mLs by mouth 3 (three) times daily with meals. 05/20/23  Yes Ghimire, Werner Lean, MD  levothyroxine (SYNTHROID) 75 MCG tablet Take 1 tablet (75 mcg total) by mouth daily at 6 (six) AM. Patient taking differently: Take 150 mcg by mouth daily at 6 (six) AM. 05/21/23  Yes Ghimire, Werner Lean, MD  metFORMIN (GLUCOPHAGE) 500 MG tablet Take 500 mg by  mouth 2 (two) times daily with a meal.   Yes [provider]  multivitamin (PROSIGHT) TABS tablet Take 1 tablet by mouth daily. Patient taking differently: Take 1 tablet by mouth daily. Via g-tube, not oral 05/20/23  Yes Ghimire, Werner Lean, MD  polyethylene glycol (MIRALAX / GLYCOLAX) 17 g packet Take 17 g by mouth 2 (two) times daily. 05/20/23  Yes Ghimire, Werner Lean, MD  tamsulosin (FLOMAX) 0.4 MG CAPS capsule Take 1 capsule (0.4 mg total) by mouth daily. Patient taking differently: Take 0.4 mg by mouth daily. Via g-tube 05/20/23  Yes Ghimire, Werner Lean, MD  thiamine (VITAMIN B-1) 100 MG tablet Take 1 tablet (100 mg total) by mouth daily. 05/20/23  Yes Ghimire, Werner Lean, MD  zinc sulfate, 50mg  elemental zinc, 220 (50 Zn) MG capsule Place 220 mg into feeding tube daily.   Yes [provider]  Nutritional Supplements (FEEDING SUPPLEMENT, GLUCERNA 1.5 CAL,) LIQD 65 mL/hr, Continuous, Starting at 1800 daily for 12 Hours Patient not taking: Reported on 08/09/2023 05/20/23   Maretta Bees, MD  Water For Irrigation, Sterile (FREE WATER) SOLN Place 400 mLs into feeding tube every 4 (four) hours. 05/20/23   Ghimire, Werner Lean, MD    Social History   Socioeconomic History   Marital status: Married    Spouse name: Not on file   Number of children: Not on file   Years of education: Not on file   Highest education level: Not on file  Occupational History    Comment: Doesn't work outside the home  Tobacco Use   Smoking status: Never   Smokeless tobacco: Never  Vaping Use   Vaping status: Never Used  Substance and Sexual Activity   Alcohol use: Not Currently    Alcohol/week: 1.0 standard drink of alcohol    Types: 1 Glasses of wine per week    Comment: occasional   Drug use: No   Sexual activity: Not Currently  Other Topics Concern   Not on file  Social History Narrative   Pt gets regular exercise.         Social Drivers of Corporate investment banker Strain: Not  on file  Food Insecurity: No Food Insecurity (08/09/2023)   Hunger Vital Sign    Worried About Running Out of Food in the Last Year: Never true    Ran Out of Food in the Last Year: Never true  Transportation Needs: No Transportation Needs (08/09/2023)   PRAPARE - Administrator, Civil Service (Medical): No    Lack of Transportation (Non-Medical): No  Physical Activity: Not on file  Stress: Not on file  Social Connections: Moderately Integrated (08/09/2023)   Social Connection and Isolation Panel [NHANES]    Frequency of Communication with Friends and Family: Once a week    Frequency of Social Gatherings with Friends and Family: Never  Attends Religious Services: 1 to 4 times per year    Active Member of Clubs or Organizations: Yes    Attends Banker Meetings: Never    Marital Status: Married  Catering manager Violence: Not At Risk (08/09/2023)   Humiliation, Afraid, Rape, and Kick questionnaire    Fear of Current or Ex-Partner: No    Emotionally Abused: No    Physically Abused: No    Sexually Abused: No     Family History  Problem Relation Age of Onset   Cancer Mother        Breast Cancer   Diabetes Mother    Breast cancer Mother    Diabetes Father     Review of Systems  Constitutional:  Positive for weight loss.  HENT: Negative.    Cardiovascular:  Positive for leg swelling.  Musculoskeletal:        Wounds  Neurological:  Positive for focal weakness.  Endo/Heme/Allergies: Negative.   Psychiatric/Behavioral: Negative.        Physical Examination  Vitals:   08/09/23 1411 08/09/23 1621  BP:  (!) 142/76  Pulse:  (!) 103  Resp:  18  Temp: 98.5 F (36.9 C) 98.1 F (36.7 C)  SpO2:  100%   Body mass index is 26.47 kg/m.  Physical Exam Eyes:     Pupils: Pupils are equal, round, and reactive to light.  Cardiovascular:     Pulses:          Femoral pulses are 2+ on the right side and 2+ on the left side.      Popliteal pulses are 0 on  the right side and 0 on the left side.  Pulmonary:     Effort: Pulmonary effort is normal.  Abdominal:     General: Abdomen is flat.     Palpations: Abdomen is soft. There is no mass.  Musculoskeletal:     Comments: Wounds as described  Neurological:     Mental Status: She is alert and oriented to person, place, and time.  Psychiatric:        Mood and Affect: Mood normal.           CBC    Component Value Date/Time   WBC 6.4 08/09/2023 0541   RBC 3.54 (L) 08/09/2023 0541   HGB 9.5 (L) 08/09/2023 0541   HCT 29.9 (L) 08/09/2023 0541   PLT 287 08/09/2023 0541   MCV 84.5 08/09/2023 0541   MCH 26.8 08/09/2023 0541   MCHC 31.8 08/09/2023 0541   RDW 14.3 08/09/2023 0541   LYMPHSABS 0.9 08/08/2023 2225   MONOABS 0.7 08/08/2023 2225   EOSABS 0.0 08/08/2023 2225   BASOSABS 0.0 08/08/2023 2225    BMET    Component Value Date/Time   NA 134 (L) 08/09/2023 0541   K 3.3 (L) 08/09/2023 0541   CL 98 08/09/2023 0541   CO2 25 08/09/2023 0541   GLUCOSE 102 (H) 08/09/2023 0541   BUN 27 (H) 08/09/2023 0541   CREATININE 0.82 08/09/2023 0541   CALCIUM 8.5 (L) 08/09/2023 0541   GFRNONAA >60 08/09/2023 0541   GFRAA >60 02/07/2020 1347    COAGS: Lab Results  Component Value Date   INR 0.9 05/18/2023   INR 1.0 05/03/2023     Non-Invasive Vascular Imaging:   ABI Findings:  +--------+------------------+-----+----------+--------+  Right  Rt Pressure (mmHg)IndexWaveform  Comment   +--------+------------------+-----+----------+--------+  Brachial110                   triphasic           +--------+------------------+-----+----------+--------+  PTA    91                0.83 monophasic          +--------+------------------+-----+----------+--------+  DP     110               1.00 monophasic          +--------+------------------+-----+----------+--------+   +---------+------------------+-----+----------+----------+  Left    Lt Pressure  (mmHg)IndexWaveform  Comment     +---------+------------------+-----+----------+----------+  Brachial                                  IV          +---------+------------------+-----+----------+----------+  PTA     81                0.74 monophasic            +---------+------------------+-----+----------+----------+  DP      82                0.75 monophasic            +---------+------------------+-----+----------+----------+  Great Toe                                 Open wound  +---------+------------------+-----+----------+----------+   +-------+-----------+-----------+------------+------------+  ABI/TBIToday's ABIToday's TBIPrevious ABIPrevious TBI  +-------+-----------+-----------+------------+------------+  Right 1.00                                            +-------+-----------+-----------+------------+------------+  Left  0.75                                            +-------+-----------+-----------+------------+------------+       Summary:  Right: Resting right ankle-brachial index is within normal range.   Unable to obtain TBI due to low amplitude/absent waveform.  Left: Resting left ankle-brachial index indicates moderate left lower  extremity arterial disease.    ASSESSMENT/PLAN: This is a 68 y.o. female with atherosclerosis of her native arteries and bilateral heel ulceration.  The right side seems superficial while the left side appears full-thickness with concomitant toe ulceration.  ABIs are monophasic throughout without palpable pulses below the femorals bilaterally.  I discussed with her and her husband proceeding with aortogram with lower extremity angiography from the left common femoral approach to possibly treat the right lower extremity.  We can also evaluate the left lower extremity depending on contrast usage however unlikely any revascularization will heal the deep ulceration of her left heel.  We discussed  the risk benefits alternatives I will get her posted with my partner Dr. Karin Lieu tomorrow and she will be n.p.o. past midnight.  Okay to continue aspirin.  Auston Halfmann C. Randie Heinz, MD [facility] Office: [phone] Pager: [phone]

## 2023-08-09 NOTE — Progress Notes (Signed)
Unable to reach Spouse [name] to obtain consent. Charge RN Web designer notified.

## 2023-08-09 NOTE — Consult Note (Signed)
WOC Nurse Consult Note: Reason for Consult: left great toe wound  However when discussing wound with bedside nursing it was reported that patient also has bilateral heel pressure injuries Images taken by provider  Patient from SNF for AMS. Hx of FTT, PEG tube, FC, PAD and DM ABIs: left 0.75/ right 1.0 Wound type: Arterial ulceration; left great toe, dry and intact Unstageable Pressure Injury; left heel; significant in size. 90% black eschar/10% pink with drainage Stage 3 Pressure Injury; right heel; 100% pink with some darkening of the periwound, but not eschar.  Pressure Injury POA: Yes Measurement: see nursing flow sheets Wound bed: see above  Drainage (amount, consistency, odor) see nursing flow sheets Periwound: intact; dry skin  Dressing procedure/placement/frequency: Paint left great toe with betadine, allow to air dry. Perform daily   Cleanse right heel with Vashe Hart [place] # [phone]), pat dry. Apply saline moist gauze 2x2 to open wound, top with foam.Change gauze daily, ok to lift foam to reapply and change foam every 3 days   Cleanse left heel with Vashe [place] # [phone])  Cover wound bed with calcium alginate Hart [place] # [phone]) to control drainage. Top with heel foam dressing Hart [place] # [id number])   Offload heels at all times; prevalon boots requested for patient.   Consider orthopedic or vascular surgeon if surgical debridement desired.  Best practice in heel pressure injuries are to keep as stable as possible.     Re consult if needed, will not follow at this time. Thanks  Jkai Arwood [employer], RN,CWOCN, CNS, CWON-AP [phone])

## 2023-08-10 ENCOUNTER — Encounter (HOSPITAL_COMMUNITY): Payer: Self-pay | Admitting: Vascular Surgery

## 2023-08-10 ENCOUNTER — Encounter (HOSPITAL_COMMUNITY)
Admission: EM | Disposition: A | Discharge status: Skilled Nursing Facility | Payer: Self-pay | Source: Skilled Nursing Facility | Attending: Internal Medicine

## 2023-08-10 DIAGNOSIS — J101 Influenza due to other identified influenza virus with other respiratory manifestations: Secondary | ICD-10-CM | POA: Diagnosis not present

## 2023-08-10 DIAGNOSIS — I70244 Atherosclerosis of native arteries of left leg with ulceration of heel and midfoot: Secondary | ICD-10-CM

## 2023-08-10 DIAGNOSIS — I70234 Atherosclerosis of native arteries of right leg with ulceration of heel and midfoot: Secondary | ICD-10-CM | POA: Diagnosis not present

## 2023-08-10 HISTORY — PX: PERIPHERAL VASCULAR INTERVENTION: CATH118257

## 2023-08-10 HISTORY — PX: PERIPHERAL VASCULAR BALLOON ANGIOPLASTY: CATH118281

## 2023-08-10 HISTORY — PX: ABDOMINAL AORTOGRAM W/LOWER EXTREMITY: CATH118223

## 2023-08-10 HISTORY — PX: PERIPHERAL INTRAVASCULAR LITHOTRIPSY: CATH118324

## 2023-08-10 LAB — POCT ACTIVATED CLOTTING TIME: Activated Clotting Time: 308 s

## 2023-08-10 LAB — CBC
HCT: 29.2 % — ABNORMAL LOW (ref 36.0–46.0)
Hemoglobin: 9.2 g/dL — ABNORMAL LOW (ref 12.0–15.0)
MCH: 26 pg (ref 26.0–34.0)
MCHC: 31.5 g/dL (ref 30.0–36.0)
MCV: 82.5 fL (ref 80.0–100.0)
Platelets: 227 10*3/uL (ref 150–400)
RBC: 3.54 MIL/uL — ABNORMAL LOW (ref 3.87–5.11)
RDW: 14.5 % (ref 11.5–15.5)
WBC: 3.6 10*3/uL — ABNORMAL LOW (ref 4.0–10.5)
nRBC: 0 % (ref 0.0–0.2)

## 2023-08-10 LAB — COMPREHENSIVE METABOLIC PANEL
ALT: 67 U/L — ABNORMAL HIGH (ref 0–44)
AST: 93 U/L — ABNORMAL HIGH (ref 15–41)
Albumin: 2.3 g/dL — ABNORMAL LOW (ref 3.5–5.0)
Alkaline Phosphatase: 75 U/L (ref 38–126)
Anion gap: 12 (ref 5–15)
BUN: 22 mg/dL (ref 8–23)
CO2: 22 mmol/L (ref 22–32)
Calcium: 8.8 mg/dL — ABNORMAL LOW (ref 8.9–10.3)
Chloride: 101 mmol/L (ref 98–111)
Creatinine, Ser: 0.74 mg/dL (ref 0.44–1.00)
GFR, Estimated: >60 mL/min (ref >60)
Glucose, Bld: 200 mg/dL — ABNORMAL HIGH (ref 70–99)
Potassium: 4.2 mmol/L (ref 3.5–5.1)
Sodium: 135 mmol/L (ref 135–145)
Total Bilirubin: 0.6 mg/dL (ref 0.0–1.2)
Total Protein: 6.2 g/dL — ABNORMAL LOW (ref 6.5–8.1)

## 2023-08-10 LAB — GLUCOSE, CAPILLARY
Glucose-Capillary: 182 mg/dL — ABNORMAL HIGH (ref 70–99)
Glucose-Capillary: 324 mg/dL — ABNORMAL HIGH (ref 70–99)
Glucose-Capillary: 263 mg/dL — ABNORMAL HIGH (ref 70–99)
Glucose-Capillary: 172 mg/dL — ABNORMAL HIGH (ref 70–99)

## 2023-08-10 LAB — URINE CULTURE: Culture: >=100000 — AB

## 2023-08-10 SURGERY — ABDOMINAL AORTOGRAM W/LOWER EXTREMITY
Anesthesia: LOCAL

## 2023-08-10 MED ORDER — HEPARIN SODIUM (PORCINE) 5000 UNIT/ML IJ SOLN
5000.0000 [IU] | Freq: Three times a day (TID) | INTRAMUSCULAR | Status: DC
  Administered 2023-08-11 – 2023-08-12 (×4): 5000 [IU] via SUBCUTANEOUS
  Filled 2023-08-10 (×4): qty 1

## 2023-08-10 MED ORDER — HEPARIN (PORCINE) IN NACL 1000-0.9 UT/500ML-% IV SOLN
INTRAVENOUS | Status: DC | PRN
  Administered 2023-08-10 (×2): 500 mL

## 2023-08-10 MED ORDER — LIDOCAINE-EPINEPHRINE 1 %-1:100000 IJ SOLN
10.0000 mL | Freq: Once | INTRAMUSCULAR | Status: DC
  Filled 2023-08-10: qty 10

## 2023-08-10 MED ORDER — SODIUM CHLORIDE 0.9 % IV SOLN: 250.0000 mL | INTRAVENOUS | Status: DC | PRN

## 2023-08-10 MED ORDER — MIDAZOLAM HCL 2 MG/2ML IJ SOLN
INTRAMUSCULAR | Status: DC | PRN
  Administered 2023-08-10: .5 mg via INTRAVENOUS

## 2023-08-10 MED ORDER — CLOPIDOGREL BISULFATE 75 MG PO TABS
75.0000 mg | ORAL_TABLET | Freq: Every day | ORAL | Status: DC
  Administered 2023-08-11 – 2023-08-12 (×2): 75 mg via ORAL
  Filled 2023-08-10 (×2): qty 1

## 2023-08-10 MED ORDER — HEPARIN SODIUM (PORCINE) 1000 UNIT/ML IJ SOLN
INTRAMUSCULAR | Status: AC
Start: 2023-08-10 — End: ?
  Filled 2023-08-10: qty 10

## 2023-08-10 MED ORDER — FENTANYL CITRATE (PF) 100 MCG/2ML IJ SOLN
INTRAMUSCULAR | Status: AC
  Filled 2023-08-10: qty 2

## 2023-08-10 MED ORDER — HEPARIN SODIUM (PORCINE) 1000 UNIT/ML IJ SOLN
INTRAMUSCULAR | Status: DC | PRN
  Administered 2023-08-10: 3000 [IU] via INTRAVENOUS
  Administered 2023-08-10: 7000 [IU] via INTRAVENOUS

## 2023-08-10 MED ORDER — SODIUM CHLORIDE 0.9% FLUSH: 3.0000 mL | INTRAVENOUS | Status: DC | PRN

## 2023-08-10 MED ORDER — MIDAZOLAM HCL 2 MG/2ML IJ SOLN
INTRAMUSCULAR | Status: AC
  Filled 2023-08-10: qty 2

## 2023-08-10 MED ORDER — LIDOCAINE HCL (PF) 1 % IJ SOLN
INTRAMUSCULAR | Status: AC
  Filled 2023-08-10: qty 30

## 2023-08-10 MED ORDER — SODIUM CHLORIDE 0.9% FLUSH
3.0000 mL | Freq: Two times a day (BID) | INTRAVENOUS | Status: DC
  Administered 2023-08-11 (×2): 3 mL via INTRAVENOUS

## 2023-08-10 MED ORDER — GLUCERNA 1.5 CAL PO LIQD
360.0000 mL | Freq: Four times a day (QID) | ORAL | Status: DC
  Administered 2023-08-10 – 2023-08-12 (×7): 360 mL
  Filled 2023-08-10 (×11): qty 474

## 2023-08-10 MED ORDER — JUVEN PO PACK
1.0000 | PACK | Freq: Two times a day (BID) | ORAL | Status: DC
  Administered 2023-08-11 – 2023-08-12 (×3): 1
  Filled 2023-08-10 (×3): qty 1

## 2023-08-10 MED ORDER — FREE WATER
100.0000 mL | Freq: Four times a day (QID) | Status: DC
  Administered 2023-08-10 – 2023-08-12 (×9): 100 mL

## 2023-08-10 MED ORDER — HYDRALAZINE HCL 20 MG/ML IJ SOLN: 5.0000 mg | INTRAMUSCULAR | Status: DC | PRN

## 2023-08-10 MED ORDER — ROSUVASTATIN CALCIUM 5 MG PO TABS
10.0000 mg | ORAL_TABLET | Freq: Every day | ORAL | Status: DC
  Administered 2023-08-10 – 2023-08-11 (×2): 10 mg via ORAL
  Filled 2023-08-10 (×2): qty 2

## 2023-08-10 MED ORDER — LABETALOL HCL 5 MG/ML IV SOLN: 10.0000 mg | INTRAVENOUS | Status: DC | PRN

## 2023-08-10 MED ORDER — CLOPIDOGREL BISULFATE 300 MG PO TABS
ORAL_TABLET | ORAL | Status: AC
  Filled 2023-08-10: qty 1

## 2023-08-10 MED ORDER — FENTANYL CITRATE (PF) 100 MCG/2ML IJ SOLN
INTRAMUSCULAR | Status: DC | PRN
  Administered 2023-08-10: 12.5 ug via INTRAVENOUS

## 2023-08-10 MED ORDER — CLOPIDOGREL BISULFATE 300 MG PO TABS
ORAL_TABLET | ORAL | Status: DC | PRN
  Administered 2023-08-10: 300 mg via ORAL

## 2023-08-10 MED ORDER — LIDOCAINE HCL (PF) 1 % IJ SOLN
INTRAMUSCULAR | Status: DC | PRN
  Administered 2023-08-10: 15 mL

## 2023-08-10 MED ORDER — IODIXANOL 320 MG/ML IV SOLN
INTRAVENOUS | Status: DC | PRN
  Administered 2023-08-10: 155 mL

## 2023-08-10 MED ORDER — SODIUM CHLORIDE 0.9 % WEIGHT BASED INFUSION
1.0000 mL/kg/h | INTRAVENOUS | Status: DC
  Administered 2023-08-10: 1 mL/kg/h via INTRAVENOUS

## 2023-08-10 SURGICAL SUPPLY — 20 items
BALLN MUSTANG 4X60X135 (BALLOONS) ×2 IMPLANT
BALLN STERLING SL 4X100X150 (BALLOONS) ×2 IMPLANT
BALLOON MUSTANG 4X60X135 (BALLOONS) IMPLANT
BALLOON STERLING SL 4X100X150 (BALLOONS) IMPLANT
CATH OMNI FLUSH 5F 65CM (CATHETERS) IMPLANT
CATH SHOCKWAVE E8 3X80 (CATHETERS) IMPLANT
COVER DOME SNAP 22 D (MISCELLANEOUS) IMPLANT
DEVICE CLOSURE MYNXGRIP 6/7F (Vascular Products) IMPLANT
GLIDEWIRE ADV .035X260CM (WIRE) IMPLANT
KIT ENCORE 26 ADVANTAGE (KITS) IMPLANT
KIT MICROPUNCTURE NIT STIFF (SHEATH) IMPLANT
SET ATX-X65L (MISCELLANEOUS) IMPLANT
SHEATH CATAPULT 6FR 60 (SHEATH) IMPLANT
SHEATH PINNACLE 5F 10CM (SHEATH) IMPLANT
SHEATH PINNACLE 6F 10CM (SHEATH) IMPLANT
SHEATH PROBE COVER 6X72 (BAG) IMPLANT
STENT ZILVER PTX 5X60 (Permanent Stent) IMPLANT
TRAY PV CATH (CUSTOM PROCEDURE TRAY) ×2 IMPLANT
WIRE BENTSON .035X145CM (WIRE) IMPLANT
WIRE SHEPHERD 4G .014 (WIRE) IMPLANT

## 2023-08-10 NOTE — Progress Notes (Signed)
Consent for Vascular consult signed by spouse and placed in chart.

## 2023-08-10 NOTE — Op Note (Signed)
Patient name: Katherine Navarro MRN: [id number] DOB: 1956-03-06 Sex: female  08/10/2023 Pre-operative Diagnosis: Bilateral lower extremity critical and ischemia with tissue loss Post-operative diagnosis:  Same Surgeon:  Victorino Sparrow, MD Procedure Performed: 1.  Ultrasound-guided micropuncture access of the left common femoral artery 2.  Aortogram 3.  Secondary cannulation, right lower extremity angiogram 4.  Third order cannulation, right lower extremity angiogram 5.  Balloon lithotripsy superficial femoral artery, popliteal artery, 3 x 80 mm shockwave 6.  Balloon angioplasty the superficial femoral artery 4 x 80 mm 7.  Drug-eluting stent 5 x 60 mm Cook Zilver to the superficial femoral artery 8.  Left lower extremity angiogram Moderate sedation time 86 minutes, contrast volume 155 mL Device assisted closure-Mynx    Indications: Patient is a 68 year old female who is nonambulatory, but able to stand and pivot who presents with bilateral lower extremity wounds, nonpalpable pulses.  ABIs demonstrated monophasic waveforms with no toe pressure appreciated.  The left heel wound does not appear salvageable, the right appears more superficial.  After discussing risks and benefits of bilateral lower extremity angiogram with emphasis on the right in an effort to define, and improve perfusion for wound healing, the patient elected to proceed.  Findings:  Aortogram: Widely patent renal arteries bilaterally, no flow-limiting stenosis in aortoiliac segments bilaterally.  On the right: Small vasculature.  Widely patent right common femoral artery, profunda.  The superficial femoral artery had moderate disease with multiple areas of tandem, flow-limiting stenosis greater than 70%.  This continued into the popliteal artery at the P1 and P2 segments.  Two-vessel outflow, anterior tibial artery, peroneal artery.  The posterior tibial artery is occluded at its ostia.  Filling of the foot is through  peroneal collaterals and the dorsalis pedis, which is small and atretic.  Severe small vessel disease in the foot.  No inline flow to the heel.  On the left: Small vasculature.  Widely patent common femoral artery, profunda and the superficial femoral artery demonstrates less disease than the right leg.  Patent popliteal artery.  Atretic anterior tibial artery, occluding at the distal tibia.  Severe disease in the tibioperoneal trunk with tandem lesions demonstrating greater than 90% stenosis.  Single-vessel peroneal runoff to the foot.   Procedure:  The patient was identified in the holding area and taken to room 8.  The patient was then placed supine on the table and prepped and draped in the usual sterile fashion.  A time out was called.  Ultrasound was used to evaluate the left common femoral artery.  It was patent .  A digital ultrasound image was acquired.  A micropuncture needle was used to access the left common femoral artery under ultrasound guidance.  An 018 wire was advanced without resistance and a micropuncture sheath was placed.  The 018 wire was removed and a benson wire was placed.  The micropuncture sheath was exchanged for a 5 french sheath.  An omniflush catheter was advanced over the wire to the level of L-1.  An abdominal angiogram was obtained.  Next, using the omniflush catheter and a benson wire, the aortic bifurcation was crossed and the catheter was placed into theright external iliac artery and right runoff was obtained.  left runoff was performed via retrograde sheath injections.   I elected to attempt intervention on the multiple areas of greater than 70% stenosis appreciated in the superficial femoral artery and popliteal artery.  The patient was heparinized and a 6 x 60 cm sheath was  brought onto the field and parked in the mid superficial femoral artery.  Angiography followed.  There appeared to be an intimal flap which occurred due to the small size of the artery and the  sheath.  The sheath was pulled back.  Due to the small size of the arteries, I elected to start with a small balloon, and I was worried about dissection, therefore I elected to use balloon lithotripsy with an inflation of 2 atm.  A 3 x 80 mm balloon was brought onto the field and expanded along the superficial femoral artery with 40 pulses, followed by popliteal artery with 40 pulses.  This demonstrated significant improvement with resolution of flow-limiting stenosis.  There was still the intimal lesion that appeared to be a dissection in the mid SFA which occurred due to the sheath and the small artery size.  I placed a 4 x 60 mm balloon over the lesion and inflated it for 3 minutes x 2.  The lesion was still present.  It appeared                  flow-limiting. I elected to place a 5 x 60 mm Cook Zilver drug-eluting stent.  This was postdilated with a 4 x 60 mm balloon with an excellent result.  Completion angiogram followed demonstrating no change in outflow, with increase flow through the superficial femoral artery.  At this point, the sheath was pulled back and left lower extremity angiogram followed.  See results above.  The patient was closed using a minx device without issue.   I am very concerned, the patient will not have enough blood flow for healing of bilateral lower extremity heel wounds.  These are pressure induced due to being nonambulatory.  She has no inline flow to the the heels bilaterally.  She is optimized from a vascular surgery perspective in the right leg.  The left leg has not been revascularized, but in its current state my partner Dr. Randie Heinz, who evaluated the wound, felt that it was nonsalvageable.  No plan for further revascularization at this time. Patient will be managed on aspirin, Plavix, high intensity statin.    Victorino Sparrow MD [facility] Office: [phone]

## 2023-08-10 NOTE — Progress Notes (Signed)
PROGRESS NOTE  BEANCA KIESTER NWG:[id number] DOB: 26-Dec-1955 DOA: 08/08/2023 PCP: No primary care provider on file.   LOS: 1 day   Brief narrative:  [name] is a 68 y.o. female with hx of recent admission 11/1-11/29 for severe hypothyroidism, myxedema, wernickes encephalopathy, and failure to thrive requiring PEG tube placement, urinary retention now with chronic foley;diabetes type 1, hypertension, hypothyroidism, PAD, cervical myelopathy, anemia, who was brought in from [facility] due to altered mental status and fever.  Unable to provide history on presentation.  In the ED, patient was afebrile.  Labs were notable for urinalysis with more than 50 white cells.  CBC without leukocytosis but hemoglobin of 10.2.   Lactate was elevated at 2.1.  Patient tested positive for influenza A.  TSH of 3.6.  Magnesium was 1.6 on presentation and potassium was 3.3.  Blood cultures were sent from the ED and patient was admitted hospital for further evaluation and treatment.      Assessment/Plan: Principal Problem:   Influenza A Active Problems:   UTI (urinary tract infection)   Acute encephalopathy   Encephalopathy  Influenza A  Was febrile.  Chest x-ray without any infiltrate.  Continue Tamiflu,Albuterol as needed, incentive spirometry flutter valve.   Catheter associated UTI. History chronic Foley, exchanged in the ED with subsequent UA suspicious for UTI.  Continue Rocephin IV.  Urine culture showing E. coli and Pseudomonas.  Acute infectious/metabolic encephalopathy. - Looks like improved.  Patient is alert awake and oriented to time place and person.  Management of influenza and UTI per above -Delirium precautions, melatonin nightly   Malnutrition, failure to thrive status post PEG tube Unclear how much nutrition by mouth versus PEG tube. For now boost 3 times daily, free water 400 mL every 6 hour via tube Nutrition consult.   Hypothyroidism, with recent severe  hypothyroidism/myxedema:  TSH within normal limits, continue levothyroxine 150 mcg daily.   History of Wernicke's encephalopathy.  Continue thiamine daily   Urinary retention with chronic Foley: Foley exchanged in ED   Diabetes type 1:  Patient is on Semglee 32 units in the morning, sliding scale at home.  Currently onn Semglee 24 units, SSI   Hypertension: Not currently on antihypertensives  PAD: With ulcer at the distal left first digit, bilateral heel ulceration.  ABI shows moderate to severe peripheral vascular disease.  Vascular surgery has been consulted.  Continue aspirin.  Possibly need angiogram of the lower extremities and revascularization efforts.  Follow vascular surgery recommendation.  Cervical myelopathy: Appears to have quadriparesis on exam, unknown if related to this past history versus possibly other process like critical illness myopathy from her recent hospitalization.   Anemia: Likely from chronic disease.  Recent iron indices, B12 folic acid within normal range..  Hemoglobin 10.   DVT prophylaxis: enoxaparin (LOVENOX) injection 40 mg Start: 08/09/23 1000   Disposition: Skilled nursing facility when okay with vascular surgery.  Status is: Inpatient Remains inpatient appropriate because: Pending clinical improvement, ischemic, lower extremity ulcers    Code Status:     Code Status: Full Code  Family Communication: None at bedside  Consultants: Vascular surgery  Procedures: ABI  Anti-infectives:  Tamiflu, Rocephin IV  Anti-infectives (From admission, onward)    Start     Dose/Rate Route Frequency Ordered Stop   08/09/23 2200  oseltamivir (TAMIFLU) capsule 30 mg  Status:  Discontinued       Placed in "Followed by" Linked Group   30 mg Per Tube 2 times  daily 08/09/23 0517 08/09/23 1346   08/09/23 2200  cefTRIAXone (ROCEPHIN) 1 g in sodium chloride 0.9 % 100 mL IVPB        1 g 200 mL/hr over 30 Minutes Intravenous Daily at bedtime 08/09/23 0518  08/13/23 2159   08/09/23 2200  oseltamivir (TAMIFLU) 6 MG/ML suspension 75 mg        75 mg Per Tube 2 times daily 08/09/23 1346 08/14/23 0959   08/09/23 0530  oseltamivir (TAMIFLU) capsule 75 mg       Placed in "Followed by" Linked Group   75 mg Per Tube  Once 08/09/23 0517 08/09/23 0550   08/08/23 2300  cefTRIAXone (ROCEPHIN) 1 g in sodium chloride 0.9 % 100 mL IVPB        1 g 200 mL/hr over 30 Minutes Intravenous  Once 08/08/23 2252 08/08/23 2330        Subjective: Today, patient was seen and examined at bedside.  Complains of mild cough.  Denies any pain in the legs.  Oriented to place and person.  Seems unaware of her leg condition.  Objective: Vitals:   08/10/23 0344 08/10/23 0836  BP: 136/68 (!) 131/59  Pulse: 94 95  Resp: 16 17  Temp:  97.8 F (36.6 C)  SpO2: 100% 100%    Intake/Output Summary (Last 24 hours) at 08/10/2023 1013 Last data filed at 08/10/2023 0900 Gross per 24 hour  Intake 0 ml  Output --  Net 0 ml   Filed Weights   08/08/23 2118  Weight: 67.8 kg   Body mass index is 26.47 kg/m.   Physical Exam:  GENERAL: Patient is alert awake and oriented to place and person.  Not in obvious distress. HENT: No scleral pallor or icterus. Pupils equally reactive to light. Oral mucosa is moist NECK: is supple, no gross swelling noted. CHEST: Clear to auscultation. No crackles or wheezes.   CVS: S1 and S2 heard, no murmur. Regular rate and rhythm.  ABDOMEN: Soft, non-tender, bowel sounds are present.  Foley catheter in place.  PEG tube in place. EXTREMITIES: No edema. CNS: Cranial nerves are intact.  Moves all extremities. SKIN: warm and dry, bilateral heel ulcers on presentation, left great toe with black discoloration.  Data Review: I have personally reviewed the following laboratory data and studies,  CBC: Recent Labs  Lab 08/08/23 2225 08/09/23 0541 08/10/23 0621  WBC 6.3 6.4 3.6*  NEUTROABS 4.8  --   --   HGB 10.2* 9.5* 9.2*  HCT 32.2* 29.9*  29.2*  MCV 83.9 84.5 82.5  PLT 318 287 227   Basic Metabolic Panel: Recent Labs  Lab 08/08/23 2225 08/09/23 0541 08/10/23 0621  NA 135 134* 135  K 3.9 3.3* 4.2  CL 96* 98 101  CO2 27 25 22   GLUCOSE 144* 102* 200*  BUN 30* 27* 22  CREATININE 0.93 0.82 0.74  CALCIUM 9.1 8.5* 8.8*  MG  --  1.6*  --   PHOS  --  3.2  --    Liver Function Tests: Recent Labs  Lab 08/08/23 2225 08/10/23 0621  AST 48* 93*  ALT 38 67*  ALKPHOS 88 75  BILITOT 0.5 0.6  PROT 7.2 6.2*  ALBUMIN 2.6* 2.3*   No results for input(s): "LIPASE", "AMYLASE" in the last 168 hours. No results for input(s): "AMMONIA" in the last 168 hours. Cardiac Enzymes: No results for input(s): "CKTOTAL", "CKMB", "CKMBINDEX", "TROPONINI" in the last 168 hours. BNP (last 3 results) Recent Labs    05/04/23  0234 05/05/23 1120 05/06/23 0551  BNP 971.2* 1,106.7* 665.9*    ProBNP (last 3 results) No results for input(s): "PROBNP" in the last 8760 hours.  CBG: Recent Labs  Lab 08/09/23 0925 08/09/23 1116 08/09/23 1630 08/09/23 2111 08/10/23 0838  GLUCAP 88 110* 308* 276* 182*   Recent Results (from the past 240 hours)  Urine Culture     Status: Abnormal   Collection Time: 08/08/23 10:00 PM   Specimen: Urine, Random  Result Value Ref Range Status   Specimen Description   Final    URINE, RANDOM Performed at [facility], [address], [place], [place] [address]    Special Requests   Final    NONE Reflexed from [id number] Performed at [facility], [address], [place], [place] [address]    Culture (A)  Final    >=100,000 COLONIES/mL ESCHERICHIA COLI 90,000 COLONIES/mL PSEUDOMONAS AERUGINOSA    Report Status 08/10/2023 FINAL  Final   Organism ID, Bacteria ESCHERICHIA COLI (A)  Final   Organism ID, Bacteria PSEUDOMONAS AERUGINOSA (A)  Final      Susceptibility   Escherichia coli - MIC*    AMPICILLIN 4 SENSITIVE Sensitive     CEFAZOLIN <=4 SENSITIVE Sensitive      CEFEPIME <=0.12 SENSITIVE Sensitive     CEFTRIAXONE <=0.25 SENSITIVE Sensitive     CIPROFLOXACIN <=0.25 SENSITIVE Sensitive     GENTAMICIN <=1 SENSITIVE Sensitive     IMIPENEM <=0.25 SENSITIVE Sensitive     NITROFURANTOIN <=16 SENSITIVE Sensitive     TRIMETH/SULFA <=20 SENSITIVE Sensitive     AMPICILLIN/SULBACTAM <=2 SENSITIVE Sensitive     PIP/TAZO <=4 SENSITIVE Sensitive ug/mL    * >=100,000 COLONIES/mL ESCHERICHIA COLI   Pseudomonas aeruginosa - MIC*    CEFTAZIDIME 4 SENSITIVE Sensitive     CIPROFLOXACIN <=0.25 SENSITIVE Sensitive     GENTAMICIN <=1 SENSITIVE Sensitive     IMIPENEM 2 SENSITIVE Sensitive     PIP/TAZO 8 SENSITIVE Sensitive ug/mL    CEFEPIME 2 SENSITIVE Sensitive     * 90,000 COLONIES/mL PSEUDOMONAS AERUGINOSA  Resp panel by RT-PCR (RSV, Flu A&B, Covid) Anterior Nasal Swab     Status: Abnormal   Collection Time: 08/08/23 10:25 PM   Specimen: Anterior Nasal Swab  Result Value Ref Range Status   SARS Coronavirus 2 by RT PCR NEGATIVE NEGATIVE Final    Comment: (NOTE) SARS-CoV-2 target nucleic acids are NOT DETECTED.  The SARS-CoV-2 RNA is generally detectable in upper respiratory specimens during the acute phase of infection. The lowest concentration of SARS-CoV-2 viral copies this assay can detect is 138 copies/mL. A negative result does not preclude SARS-Cov-2 infection and should not be used as the sole basis for treatment or other patient management decisions. A negative result may occur with  improper specimen collection/handling, submission of specimen other than nasopharyngeal swab, presence of viral mutation(s) within the areas targeted by this assay, and inadequate number of viral copies(<138 copies/mL). A negative result must be combined with clinical observations, patient history, and epidemiological information. The expected result is Negative.  Fact Sheet for Patients:  BloggerCourse.com  Fact Sheet for Healthcare Providers:   SeriousBroker.it  This test is no t yet approved or cleared by the [place] FDA and  has been authorized for detection and/or diagnosis of SARS-CoV-2 by FDA under an Emergency Use Authorization (EUA). This EUA will remain  in effect (meaning this test can be used) for the duration of the COVID-19 declaration under Section 564(b)(1) of  the Act, 21 U.S.C.section 360bbb-3(b)(1), unless the authorization is terminated  or revoked sooner.       Influenza A by PCR POSITIVE (A) NEGATIVE Final   Influenza B by PCR NEGATIVE NEGATIVE Final    Comment: (NOTE) The Xpert Xpress SARS-CoV-2/FLU/RSV plus assay is intended as an aid in the diagnosis of influenza from Nasopharyngeal swab specimens and should not be used as a sole basis for treatment. Nasal washings and aspirates are unacceptable for Xpert Xpress SARS-CoV-2/FLU/RSV testing.  Fact Sheet for Patients: BloggerCourse.com  Fact Sheet for Healthcare Providers: SeriousBroker.it  This test is not yet approved or cleared by the [place] FDA and has been authorized for detection and/or diagnosis of SARS-CoV-2 by FDA under an Emergency Use Authorization (EUA). This EUA will remain in effect (meaning this test can be used) for the duration of the COVID-19 declaration under Section 564(b)(1) of the Act, 21 U.S.C. section 360bbb-3(b)(1), unless the authorization is terminated or revoked.     Resp Syncytial Virus by PCR NEGATIVE NEGATIVE Final    Comment: (NOTE) Fact Sheet for Patients: BloggerCourse.com  Fact Sheet for Healthcare Providers: SeriousBroker.it  This test is not yet approved or cleared by the [place] FDA and has been authorized for detection and/or diagnosis of SARS-CoV-2 by FDA under an Emergency Use Authorization (EUA). This EUA will remain in effect (meaning this test can be used)  for the duration of the COVID-19 declaration under Section 564(b)(1) of the Act, 21 U.S.C. section 360bbb-3(b)(1), unless the authorization is terminated or revoked.  Performed at [facility], [address], [place], [place] [address]   Culture, blood (routine x 2)     Status: None (Preliminary result)   Collection Time: 08/08/23 10:26 PM   Specimen: BLOOD  Result Value Ref Range Status   Specimen Description   Final    BLOOD LEFT ANTECUBITAL Performed at [facility], [address] [place], [place] [address]    Special Requests   Final    BOTTLES DRAWN AEROBIC ONLY Blood Culture results may not be optimal due to an inadequate volume of blood received in culture bottles Performed at [facility], [address] [place], [place] [address]    Culture   Final    NO GROWTH 2 DAYS Performed at [facility], [address] [place], [place] [address]    Report Status PENDING  Incomplete  Culture, blood (routine x 2)     Status: None (Preliminary result)   Collection Time: 08/08/23 10:45 PM   Specimen: BLOOD  Result Value Ref Range Status   Specimen Description   Final    BLOOD BLOOD LEFT HAND Performed at [facility], [address] [place], [place] [address]    Special Requests   Final    BOTTLES DRAWN AEROBIC AND ANAEROBIC Blood Culture results may not be optimal due to an inadequate volume of blood received in culture bottles Performed at [facility], [address], [place], [place] [address]    Culture   Final    NO GROWTH 2 DAYS Performed at [facility], [address], [place], [place] [address]    Report Status PENDING  Incomplete     Studies: VAS [place] ABI WITH/WO TBI Result Date: 08/09/2023  LOWER EXTREMITY DOPPLER STUDY Patient Name:  JAYLYN IYER  Date of Exam:   08/09/2023 Medical Rec #: [id number]         Accession #:  [id number] Date of Birth:  [birth year]         Patient Gender: F Patient Age:   68 years Exam Location:  [facility] Procedure:      VAS [place] ABI WITH/WO TBI Referring Phys: Dolly Rias --------------------------------------------------------------------------------  Indications: Peripheral artery disease. High Risk Factors: Hypertension, hyperlipidemia, Diabetes.  Limitations: Today's exam was limited due to Left upper arm IV, an open wound              and bandages. Comparison Study: No prior studies. Performing Technologist: Olen Cordial RVT  Examination Guidelines: A complete evaluation includes at minimum, Doppler waveform signals and systolic blood pressure reading at the level of bilateral brachial, anterior tibial, and posterior tibial arteries, when vessel segments are accessible. Bilateral testing is considered an integral part of a complete examination. Photoelectric Plethysmograph (PPG) waveforms and toe systolic pressure readings are included as required and additional duplex testing as needed. Limited examinations for reoccurring indications may be performed as noted.  ABI Findings: +--------+------------------+-----+----------+--------+ Right   Rt Pressure (mmHg)IndexWaveform  Comment  +--------+------------------+-----+----------+--------+ Brachial110                    triphasic          +--------+------------------+-----+----------+--------+ PTA     91                0.83 monophasic         +--------+------------------+-----+----------+--------+ DP      110               1.00 monophasic         +--------+------------------+-----+----------+--------+ +---------+------------------+-----+----------+----------+ Left     Lt Pressure (mmHg)IndexWaveform  Comment    +---------+------------------+-----+----------+----------+ Brachial                                  IV         +---------+------------------+-----+----------+----------+ PTA      81                0.74 monophasic            +---------+------------------+-----+----------+----------+ DP       82                0.75 monophasic           +---------+------------------+-----+----------+----------+ Great Toe                                 Open wound +---------+------------------+-----+----------+----------+ +-------+-----------+-----------+------------+------------+ ABI/TBIToday's ABIToday's TBIPrevious ABIPrevious TBI +-------+-----------+-----------+------------+------------+ Right  1.00                                           +-------+-----------+-----------+------------+------------+ Left   0.75                                           +-------+-----------+-----------+------------+------------+  Summary: Right: Resting right ankle-brachial index is within normal range. Unable to obtain TBI due to low amplitude/absent waveform. Left: Resting left ankle-brachial index indicates moderate left lower extremity arterial disease. Unable to obtain TBI due to open wound. *See table(s) above for measurements and observations.  Electronically signed by Lemar Livings  MD on 08/09/2023 at 4:38:17 PM.    Final    DG Chest Portable 1 View Result Date: 08/08/2023 CLINICAL DATA:  Fever, cough EXAM: PORTABLE CHEST 1 VIEW COMPARISON:  05/03/2023 FINDINGS: Heart and mediastinal contours are within normal limits. No focal opacities or effusions. No acute bony abnormality. IMPRESSION: No active disease. Electronically Signed   By: Charlett Nose M.D.   On: 08/08/2023 22:15      Joycelyn Das, MD  [facility] 08/10/2023  If 7PM-7AM, please contact night-coverage

## 2023-08-10 NOTE — Inpatient Diabetes Management (Signed)
Inpatient Diabetes Program Recommendations  AACE/ADA: New Consensus Statement on Inpatient Glycemic Control (2015)  Target Ranges:  Prepandial:   less than 140 mg/dL      Peak postprandial:   less than 180 mg/dL (1-2 hours)      Critically ill patients:  140 - 180 mg/dL   Lab Results  Component Value Date   GLUCAP 182 (H) 08/10/2023   HGBA1C 9.9 (H) 04/24/2023    Review of Glycemic Control  Latest Reference Range & Units 08/09/23 16:30 08/09/23 21:11 08/10/23 08:38  Glucose-Capillary 70 - 99 mg/dL 782 (H) 956 (H) 213 (H)  (H): Data is abnormally high Diabetes history: Type 2 DM Outpatient Diabetes medications: Basaglar 32 units every day, Metformin 500 mg BID, Novolog 0-15 units Q4H Current orders for Inpatient glycemic control: Semglee 24 units every day, Novolog 0-6 units TID  Inpatient Diabetes Program Recommendations:    Consider increasing Semglee to 28 units every day.  Thanks, Lujean Rave, MSN, RNC-OB Diabetes Coordinator [phone] (8a-5p)

## 2023-08-10 NOTE — Hospital Course (Addendum)
 Katherine Navarro

## 2023-08-10 NOTE — Plan of Care (Signed)

## 2023-08-10 NOTE — TOC Initial Note (Signed)
Transition of Care [facility]) - Initial/Assessment Note    Patient Details  Name: Katherine Navarro MRN: [id number] Date of Birth: 09-19-1955  Transition of [facility]) CM/SW Contact:    Carley Hammed, LCSW Phone Number: 08/10/2023, 10:13 AM  Clinical Narrative:                  CSW confirmed with [facility] that pt has been there for short term rehab. Facility can accept pt back with new authorization. CSW to follow for PT/OT recommendations and will start SNF workup when appropriate. TOC will continue to follow.        Patient Goals and CMS Choice            Expected Discharge Plan and Services                                              Prior Living Arrangements/Services                       Activities of Daily Living   ADL Screening (condition at time of admission) Independently performs ADLs?: No Does the patient have a NEW difficulty with bathing/dressing/toileting/self-feeding that is expected to last >3 days?: Yes (Initiates electronic notice to provider for possible OT consult) Does the patient have a NEW difficulty with getting in/out of bed, walking, or climbing stairs that is expected to last >3 days?: Yes (Initiates electronic notice to provider for possible PT consult) Does the patient have a NEW difficulty with communication that is expected to last >3 days?: No Is the patient deaf or have difficulty hearing?: No Does the patient have difficulty seeing, even when wearing glasses/contacts?: No Does the patient have difficulty concentrating, remembering, or making decisions?: No  Permission Sought/Granted                  Emotional Assessment              Admission diagnosis:  Influenza A [J10.1] Encephalopathy [G93.40] Urinary tract infection associated with indwelling urethral catheter, initial encounter (HCC) [K74.259D, N39.0] Patient Active Problem List   Diagnosis Date Noted   Influenza A 08/09/2023   UTI (urinary  tract infection) 08/09/2023   Acute encephalopathy 08/09/2023   Encephalopathy 08/09/2023   Malnutrition of moderate degree 05/05/2023   Severe hypothyroidism 04/22/2023   HNP (herniated nucleus pulposus), cervical 02/06/2020   Atypical glandular cells on cervical Pap smear 10/31/2019   Stenosis of cervix 10/31/2019   Uterine leiomyoma 10/31/2019   Vitamin D deficiency 10/31/2019   Endometrial hyperplasia 12/04/2015   Graves' orbitopathy 07/29/2015   Proliferative diabetic retinopathy of both eyes with macular edema associated with type 2 diabetes mellitus (HCC) 03/25/2015   Numbness 11/19/2014   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 Pharmacy:   [facility] [address], [place] - [address] [place] [address] Phone: [phone] Fax: [phone]  Social Drivers of Health (SDOH) Social History: SDOH Screenings   Food Insecurity: No Food Insecurity (08/09/2023)  Housing: Low Risk  (08/09/2023)  Transportation Needs: No Transportation Needs (08/09/2023)  Utilities: Not At Risk (08/09/2023)  Depression (PHQ2-9): Low Risk  (10/31/2019)  Social Connections: Moderately Integrated (08/09/2023)  Tobacco Use: Low Risk  (08/09/2023)   SDOH Interventions:     Readmission Risk Interventions     No data to display

## 2023-08-10 NOTE — Progress Notes (Addendum)
Initial Nutrition Assessment  DOCUMENTATION CODES:   Not applicable  INTERVENTION:  Initiate bolus tube feeding via PEG:  1.5 Cartons Glucerna 1.5 at 0800, 1200, 1600, 2000 ( 6 cartons, 1422 ml per day) 60 ml water flush before and after each bolus 100 ml FWF Q6H  Provides 2136 kcal,  118 gm protein, 1080 ml free water daily (1960 ml water daily TF +FWF)  100 mg Thiamine daily per tube  1 packet Juven BID, each packet provides 95 calories, 2.5 grams of protein (collagen) to support wound healing  NUTRITION DIAGNOSIS:   Inadequate oral intake related to chronic illness as evidenced by NPO status.  GOAL:   Patient will meet greater than or equal to 90% of their needs   MONITOR:   Diet advancement, I & O's, Labs, TF tolerance  REASON FOR ASSESSMENT:  Consult Assessment of nutrition requirement/status, Enteral/tube feeding initiation and management  ASSESSMENT:   68 y.o female with recent admission 11/1-111/29 for hypothyroidism, wernickes, and FTT requiring PEG tube placement and chronic foley placement. PMH of T1DM, constipation, HTN, PAD, anemia, cervical myelopathy, anemia. Coming from SNF for AMS and fever. Found to have influenza A.  RD working remotely, unable to contact Husband for nutritional history. History obtained through chart review. Patient has used a PEG tube for her nutritional needs since 04/2023 due to poor PO intake. Unsure if she was eating by mouth at SNF. She was on nocturnal TF of Glucerna 1.5 @ 75 ml/hr for 12 hours with Prosource TF20 60 ml BID during last admission. She was on a dysphagia 3 diet during this time and nocturnal tube feeds were used to promote PO intake. Unknown what TF regimen she had at SNF.   She has lost 3 lbs since 11/204, 2% weight loss, not clinically significant.  Has chronic wounds that increase her nutritional needs. Will start Bolus of Glucerna 1.5. Since admission she had been getting Boost Plus 237 ml TID with 400 ml FWF  through her PEG tube.   Admit weight: 67.8 kg Current weight: 67.8 kg   Average Meal Intake: NPO  Nutritionally Relevant Medications: Scheduled Meds:  famotidine  20 mg Per Tube Daily   feeding supplement (GLUCERNA 1.5 CAL)  360 mL Per Tube QID   folic acid  1 mg Per Tube Daily   free water  100 mL Per Tube Q6H   insulin aspart  0-6 Units Subcutaneous TID WC   insulin glargine-yfgn  24 Units Subcutaneous Daily   levothyroxine  150 mcg Per Tube Q0600   nutrition supplement (JUVEN)  1 packet Per Tube BID BM   thiamine  100 mg Per Tube Daily   Continuous Infusions:  cefTRIAXone (ROCEPHIN)  IV 1 g (08/09/23 2119)   Labs Reviewed: Calcium 8.8, CBG ranges from 88-308 mg/dL over the last 24 hours HgbA1c 9.9  NUTRITION - FOCUSED PHYSICAL EXAM:  - Deferred to follow up   Diet Order:   Diet Order             Diet NPO time specified  Diet effective midnight                   EDUCATION NEEDS:   Not appropriate for education at this time  Skin:  Skin Assessment: Skin Integrity Issues: Skin Integrity Issues:: Incisions, Stage III, Unstageable Stage III: R heel Unstageable: Left heel Incisions: Left great toe incision/ulceration  Last BM:  08/09/23  Height:   Ht Readings from Last 1 Encounters:  08/08/23  5\' 3"  (1.6 m)    Weight:   Wt Readings from Last 1 Encounters:  08/08/23 67.8 kg    Ideal Body Weight:  52.3 kg  BMI:  Body mass index is 26.47 kg/m.  Estimated Nutritional Needs:   Kcal:  2000-2200 kcal  Protein:  100-125 gm  Fluid:  >2L/day   Elliot Dally, RD Registered Dietitian  See Amion for more information

## 2023-08-10 NOTE — Progress Notes (Signed)
  Daily Progress Note  Critical limb ischemia with bilateral lower extremity wounds  Subjective: No complaints  Objective: Vitals:   08/10/23 0344 08/10/23 0836  BP: 136/68 (!) 131/59  Pulse: 94 95  Resp: 16 17  Temp:  97.8 F (36.6 C)  SpO2: 100% 100%    Physical Examination Wounds on bilateral lower extremities, nonpalpable pulses Left-sided wounds worse, full-thickness Nonlabored breathing, regular rate  ASSESSMENT/PLAN:  Patient is a 68 year old female presenting with bilateral lower extremity wounds, while she is nonambulatory, she is able to stand and pivot.  In effort to salvage the right lower extremity, and plan the level of left lower extremity potation, we discussed bilateral lower extremity angiogram with emphasis on the right for possible revascularization.  After [place] the risk and benefits, she elected to proceed.   Victorino Sparrow MD [facility] [phone] 08/10/2023  1:02 PM

## 2023-08-11 DIAGNOSIS — J101 Influenza due to other identified influenza virus with other respiratory manifestations: Secondary | ICD-10-CM | POA: Diagnosis not present

## 2023-08-11 LAB — LIPID PANEL
Cholesterol: 140 mg/dL (ref 0–200)
HDL: 28 mg/dL — ABNORMAL LOW (ref >40)
LDL Cholesterol: 88 mg/dL (ref 0–99)
Total CHOL/HDL Ratio: 5 {ratio}
Triglycerides: 120 mg/dL (ref <150)
VLDL: 24 mg/dL (ref 0–40)

## 2023-08-11 LAB — GLUCOSE, CAPILLARY
Glucose-Capillary: 184 mg/dL — ABNORMAL HIGH (ref 70–99)
Glucose-Capillary: 184 mg/dL — ABNORMAL HIGH (ref 70–99)
Glucose-Capillary: 231 mg/dL — ABNORMAL HIGH (ref 70–99)
Glucose-Capillary: 219 mg/dL — ABNORMAL HIGH (ref 70–99)

## 2023-08-11 MED ORDER — INSULIN GLARGINE-YFGN 100 UNIT/ML ~~LOC~~ SOLN
28.0000 [IU] | Freq: Every day | SUBCUTANEOUS | Status: DC
  Administered 2023-08-12: 28 [IU] via SUBCUTANEOUS
  Filled 2023-08-11: qty 0.28

## 2023-08-11 NOTE — Evaluation (Signed)
Occupational Therapy Evaluation Patient Details Name: Katherine Navarro MRN: [id number] DOB: Feb 06, 1956 Today's Date: 08/11/2023   History of Present Illness   Katherine Navarro is a 68 y.o. female who presented from SNF with AMS and fever. Found to have flu A and UTI. 2/19 Angiogram via left CFA with balloon lithotripsy right SFA and popliteal artery, balloon angioplasty right SFA and drug eluting stent right SFA. PMH includes: admission 11/1-11/29 for severe hypothyroidism, myxedema, wernickes encephalopathy, and failure to thrive requiring PEG tube placement, urinary retention now with chronic foley;diabetes type 1, hypertension, hypothyroidism, PAD, cervical myelopathy, anemia     Clinical Impressions [name] was evaluated s/p the above admission list. She presented from SNF with unknown PLOF at baseline; per chart review pt has not ambulated since 04/2023 and staff assists with stand pivot transfers OOB. Upon evaluation, pt demonstrated max-total A +2 ability to complete bed mobility and ADLs. She had impairments in cognition, confusion, weakness, poor sitting balance, BLE wounds, BUE weakness and contractures as well as low vision. Anticipate pt is near her functional ADL baseline. Recommend discharge back to skilled inpatient follow up therapy, <3 hours/day. OT to sign off with appreciation of order, please re-consult if needed.       If plan is discharge home, recommend the following:   A lot of help with walking and/or transfers;A lot of help with bathing/dressing/bathroom;Two people to help with walking and/or transfers;Two people to help with bathing/dressing/bathroom;Assistance with cooking/housework;Assistance with feeding;Direct supervision/assist for financial management;Direct supervision/assist for medications management;Assist for transportation;Help with stairs or ramp for entrance;Supervision due to cognitive status     Functional Status Assessment   Patient has had a recent  decline in their functional status and demonstrates the ability to make significant improvements in function in a reasonable and predictable amount of time.     Equipment Recommendations   None recommended by OT      Precautions/Restrictions   Precautions Precautions: Fall Precaution/Restrictions Comments: PEG, chronic foley, low vision Restrictions Weight Bearing Restrictions Per Provider Order: No     Mobility Bed Mobility Overal bed mobility: Needs Assistance Bed Mobility: Rolling, Sidelying to Sit, Sit to Supine Rolling: Total assist, +2 for physical assistance, +2 for safety/equipment Sidelying to sit: Total assist, +2 for physical assistance, +2 for safety/equipment   Sit to supine: Total assist, +2 for physical assistance, +2 for safety/equipment   General bed mobility comments: did not initiate or sequence thorugh any aspect of bed mobility    Transfers            Balance Overall balance assessment: Needs assistance Sitting-balance support: Feet supported Sitting balance-Leahy Scale: Zero               ADL either performed or assessed with clinical judgement   ADL Overall ADL's : Needs assistance/impaired Eating/Feeding: Maximal assistance;Bed level   Grooming: Maximal assistance;Bed level   Upper Body Bathing: Maximal assistance;Bed level   Lower Body Bathing: Total assistance;Bed level   Upper Body Dressing : Maximal assistance;Bed level   Lower Body Dressing: Total assistance;Bed level   Toilet Transfer: Total assistance   Toileting- Clothing Manipulation and Hygiene: Total assistance       Functional mobility during ADLs: Maximal assistance General ADL Comments: anticipate pt is at her ADL baseline     Vision Baseline Vision/History: 2 Legally blind;5 Retinopathy Vision Assessment?: Vision impaired- to be further tested in functional context     Perception Perception: Not tested       Praxis Praxis:  Not tested        Pertinent Vitals/Pain Pain Assessment Pain Assessment: Faces Faces Pain Scale: No hurt Pain Intervention(s): Monitored during session     Extremity/Trunk Assessment Upper Extremity Assessment Upper Extremity Assessment: RUE deficits/detail;LUE deficits/detail RUE Deficits / Details: weakness, limited over head ROM. functional use was difficult to assess as pt did not follow commands. stiffness and mild contractures noted throughout LUE Deficits / Details: weakness, limited over head ROM. functional use was difficult to assess as pt did not follow commands. stiffness and mild contractures noted throughout   Lower Extremity Assessment Lower Extremity Assessment: Defer to PT evaluation   Cervical / Trunk Assessment Cervical / Trunk Assessment: Normal   Communication Communication Communication: No apparent difficulties   Cognition Arousal: Alert Behavior During Therapy: Flat affect Cognition: No family/caregiver present to determine baseline             OT - Cognition Comments: pt was oriented to self and place. unabel to provide PLOF details. needed multimodal cues to follow simple 1 step commands. notable confusion throughout. did not initiate or sequence any functional tasks. pt also thought her husband was in the room, he was not.                 Following commands: Impaired Following commands impaired: Follows one step commands inconsistently     Cueing  General Comments   Cueing Techniques: Verbal cues;Gestural cues;Tactile cues              Home Living Family/patient expects to be discharged to:: Skilled nursing facility                                 Additional Comments: presented from [facility]      Prior Functioning/Environment Prior Level of Function : Needs assist             Mobility Comments: per chart review, pt has not walked since 04/2023. At SNF, she stands with assist and pivots OOB. Pt unable to  confirm. ADLs Comments: Pt unable to report. Anticipate max-total A for all ADLs    OT Problem List: Decreased strength;Decreased range of motion;Decreased activity tolerance;Impaired balance (sitting and/or standing);Decreased cognition;Decreased coordination;Impaired vision/perception;Decreased safety awareness;Decreased knowledge of use of DME or AE;Decreased knowledge of precautions;Impaired UE functional use        OT Goals(Current goals can be found in the care plan section)   Acute Rehab OT Goals Patient Stated Goal: to eat OT Goal Formulation: With patient Time For Goal Achievement: 08/11/23 Potential to Achieve Goals: Fair   AM-PAC OT "6 Clicks" Daily Activity     Outcome Measure Help from another person eating meals?: A Lot Help from another person taking care of personal grooming?: A Lot Help from another person toileting, which includes using toliet, bedpan, or urinal?: Total Help from another person bathing (including washing, rinsing, drying)?: Total Help from another person to put on and taking off regular upper body clothing?: A Lot Help from another person to put on and taking off regular lower body clothing?: Total 6 Click Score: 9   End of Session Nurse Communication: Mobility status  Activity Tolerance: Patient tolerated treatment well Patient left: in bed;with bed alarm set;with call bell/phone within reach  OT Visit Diagnosis: Other abnormalities of gait and mobility (R26.89);Muscle weakness (generalized) (M62.81)                Time: 1610-9604  OT Time Calculation (min): 25 min Charges:  OT General Charges $OT Visit: 1 Visit OT Evaluation $OT Eval Moderate Complexity: 1 Mod  Derenda Mis, OTR/L Acute Rehabilitation Services Office [phone] Secure Chat Communication Preferred   Donia Pounds 08/11/2023, 1:17 PM

## 2023-08-11 NOTE — Plan of Care (Signed)

## 2023-08-11 NOTE — Progress Notes (Signed)
Patient oxygent saturation at 71% with consistent pleth. Pt lying comfortably with eyes closed. RN applied 1L [place]. Sats improved to 99%.  Kenard Gower, RN

## 2023-08-11 NOTE — Evaluation (Signed)
Physical Therapy Evaluation Patient Details Name: Katherine Navarro MRN: [id number] DOB: [birth year] Today's Date: 08/11/2023  History of Present Illness  Katherine Navarro is a 68 y.o. female who presented from SNF with AMS and fever. Found to have flu A and UTI. 2/19 Angiogram via left CFA with balloon lithotripsy right SFA and popliteal artery, balloon angioplasty right SFA and drug eluting stent right SFA. PMH includes: admission 11/1-11/29 for severe hypothyroidism, myxedema, wernickes encephalopathy, and failure to thrive requiring PEG tube placement, urinary retention now with chronic foley;diabetes type 1, hypertension, hypothyroidism, PAD, cervical myelopathy, anemia  Clinical Impression  Recommend return to SNF for continued therapy. Will defer further therapy to the SNF and will not follow acutely.         If plan is discharge home, recommend the following: Two people to help with walking and/or transfers;Two people to help with bathing/dressing/bathroom;Direct supervision/assist for financial management;Direct supervision/assist for medications management   Can travel by private vehicle   No    Equipment Recommendations Other (comment) (Determine at SNF)  Recommendations for Other Services       Functional Status Assessment Patient has had a recent decline in their functional status and/or demonstrates limited ability to make significant improvements in function in a reasonable and predictable amount of time     Precautions / Restrictions Precautions Precautions: Fall Precaution/Restrictions Comments: PEG, chronic foley, low vision Restrictions Weight Bearing Restrictions Per Provider Order: No      Mobility  Bed Mobility Overal bed mobility: Needs Assistance Bed Mobility: Sit to Supine, Supine to Sit     Supine to sit: Total assist, HOB elevated Sit to supine: Total assist   General bed mobility comments: Assist for all aspects    Transfers                         Ambulation/Gait                  Stairs            Wheelchair Mobility     Tilt Bed    Modified Rankin (Stroke Patients Only)       Balance Overall balance assessment: Needs assistance Sitting-balance support: Feet supported Sitting balance-Leahy Scale: Poor Sitting balance - Comments: Sat EOB x 5 minutes with min to mod assist.                                     Pertinent Vitals/Pain Pain Assessment Pain Assessment: Faces Faces Pain Scale: Hurts even more Pain Location: Bilateral knees with transition to EOB and back to supine Pain Descriptors / Indicators: Grimacing, Moaning Pain Intervention(s): Limited activity within patient's tolerance, Monitored during session, Repositioned    Home Living Family/patient expects to be discharged to:: Skilled nursing facility                   Additional Comments: presented from [facility]    Prior Function Prior Level of Function : Needs assist             Mobility Comments: per chart review, pt has not walked since 04/2023. At SNF, she stands with assist and pivots OOB. Pt unable to confirm. ADLs Comments: Pt unable to report. Anticipate max-total A for all ADLs     Extremity/Trunk Assessment   Upper Extremity Assessment Upper Extremity Assessment: Defer to OT evaluation RUE Deficits /  Details: weakness, limited over head ROM. functional use was difficult to assess as pt did not follow commands. stiffness and mild contractures noted throughout LUE Deficits / Details: weakness, limited over head ROM. functional use was difficult to assess as pt did not follow commands. stiffness and mild contractures noted throughout    Lower Extremity Assessment Lower Extremity Assessment: RLE deficits/detail;LLE deficits/detail RLE Deficits / Details: Unable to formally test but appears  <3/5 LLE Deficits / Details: Unable to formally test but appears <3/5    Cervical / Trunk  Assessment Cervical / Trunk Assessment: Normal  Communication   Communication Communication: No apparent difficulties    Cognition Arousal: Alert Behavior During Therapy: Flat affect   PT - Cognitive impairments: No family/caregiver present to determine baseline                       PT - Cognition Comments: Pt unable to give info on her funtional level at SNF. Also thinks she is going home with husband Following commands: Impaired Following commands impaired: Follows one step commands inconsistently     Cueing Cueing Techniques: Verbal cues, Gestural cues, Tactile cues     General Comments General comments (skin integrity, edema, etc.): VSS on 1L    Exercises Other Exercises Other Exercises: Assisted reach anteriorly with lean sitting EOB   Assessment/Plan    PT Assessment All further PT needs can be met in the next venue of care  PT Problem List Decreased strength;Decreased activity tolerance;Decreased balance;Decreased mobility;Decreased cognition       PT Treatment Interventions      PT Goals (Current goals can be found in the Care Plan section)  Acute Rehab PT Goals Patient Stated Goal: go home PT Goal Formulation: All assessment and education complete, DC therapy    Frequency       Co-evaluation               AM-PAC PT "6 Clicks" Mobility  Outcome Measure Help needed turning from your back to your side while in a flat bed without using bedrails?: Total Help needed moving from lying on your back to sitting on the side of a flat bed without using bedrails?: Total Help needed moving to and from a bed to a chair (including a wheelchair)?: Total Help needed standing up from a chair using your arms (e.g., wheelchair or bedside chair)?: Total Help needed to walk in hospital room?: Total Help needed climbing 3-5 steps with a railing? : Total 6 Click Score: 6    End of Session Equipment Utilized During Treatment: Oxygen Activity Tolerance:  Patient tolerated treatment well Patient left: in bed;with call bell/phone within reach;with bed alarm set   PT Visit Diagnosis: Other abnormalities of gait and mobility (R26.89);Muscle weakness (generalized) (M62.81)    Time: 9629-5284 PT Time Calculation (min) (ACUTE ONLY): 15 min   Charges:   PT Evaluation $PT Eval Moderate Complexity: 1 Mod   PT General Charges $$ ACUTE PT VISIT: 1 Visit         [facility] PT Acute Rehabilitation Services Office [phone]   Angelina Ok Hughes [facility] 08/11/2023, 1:45 PM

## 2023-08-11 NOTE — Progress Notes (Signed)
PHARMACIST LIPID MONITORING   Katherine Navarro is a 68 y.o. female admitted on 08/08/2023 with PVD.  Pharmacy has been consulted to optimize lipid-lowering therapy with the indication of secondary prevention for clinical ASCVD.  Recent Labs:  Lipid Panel (last 6 months):   Lab Results  Component Value Date   CHOL 140 08/11/2023   TRIG 120 08/11/2023   HDL 28 (L) 08/11/2023   CHOLHDL 5.0 08/11/2023   VLDL 24 08/11/2023   LDLCALC 88 08/11/2023    Hepatic function panel (last 6 months):   Lab Results  Component Value Date   AST 93 (H) 08/10/2023   ALT 67 (H) 08/10/2023   ALKPHOS 75 08/10/2023   BILITOT 0.6 08/10/2023    SCr (since admission):   Serum creatinine: 0.74 mg/dL 16/10/96 0454 Estimated creatinine clearance: 64.9 mL/min  Current therapy and lipid therapy tolerance Current lipid-lowering therapy: Crestor 10mg  daily Previous lipid-lowering therapies (if applicable): Atorvastatin Documented or reported allergies or intolerances to lipid-lowering therapies (if applicable): She reports history of myalgias   Plan:    1.Statin intensity (high intensity recommended for all patients regardless of the LDL):  Statin intolerance noted. No statin changes due to serious side effects (ex. Myalgias with at least 2 different statins). -Crestor 10mg  po daily added per MD. Would increase to 20-40mg  po daily in 3-4 weeks if no myalgias noted  2.Add ezetimibe (if any one of the following):   Not indicated at this time.  3.Refer to lipid clinic:   No  4.Follow-up with:  Primary care provider - No primary care provider on file.  5.Follow-up labs after discharge:  Changes in lipid therapy were made. Check a lipid panel in 8-12 weeks then annually.      Harland German, PharmD Clinical Pharmacist **Pharmacist phone directory can now be found on [email or web address] ([email or web address]).  Listed under [facility].

## 2023-08-11 NOTE — Progress Notes (Addendum)
PROGRESS NOTE  Katherine Navarro:[id number] DOB: May 20, 1956 DOA: 08/08/2023 PCP: No primary care provider on file.   LOS: 2 days   Brief narrative:  Katherine Navarro is a 68 y.o. female with hx of recent admission 11/1-11/29 for severe hypothyroidism, myxedema, wernickes encephalopathy, and failure to thrive requiring PEG tube placement, urinary retention now with chronic foley;diabetes type 1, hypertension, hypothyroidism, PAD, cervical myelopathy, anemia, who was brought in from [facility] due to altered mental status and fever.  Unable to provide history on presentation.  In the ED, patient was afebrile.  Labs were notable for urinalysis with more than 50 white cells.  CBC without leukocytosis but hemoglobin of 10.2.   Lactate was elevated at 2.1.  Patient tested positive for influenza A.  TSH of 3.6.  Magnesium was 1.6 on presentation and potassium was 3.3.  Blood cultures were sent from the ED and patient was admitted hospital for further evaluation and treatment.      Assessment/Plan: Principal Problem:   Influenza A Active Problems:   UTI (urinary tract infection)   Acute encephalopathy   Encephalopathy  Influenza A  Was febrile.  Chest x-ray without any infiltrate.  Continue Tamiflu,Albuterol as needed, incentive spirometry flutter valve.  Continue Tamiflu to complete the course.  On room air.   Catheter associated UTI. History chronic Foley, exchanged in the ED with subsequent UA suspicious for UTI.  Initially received Rocephin IV, urine culture showing E. coli and Pseudomonas.  Will discontinue antibiotic  Acute infectious/metabolic encephalopathy. Improved.  At baseline patient is alert awake and oriented to time place and person.. Delirium precautions, melatonin nightly   Malnutrition, failure to thrive status post PEG tube On tube feeding.  Continue treatment   Hypothyroidism, with recent severe hypothyroidism/myxedema:  TSH within normal limits, continue  levothyroxine 150 mcg daily.   History of Wernicke's encephalopathy.  Continue thiamine daily   Urinary retention with chronic Foley: Foley exchanged in ED.  Will continue on discharge  Diabetes type 1:  Patient is on Semglee 32 units in the morning, sliding scale at home.  Currently on Semglee 28 units, SSI while in the hospital.  Seen by diabetic coordinator  Hypertension: Not currently on antihypertensives latest blood pressure of 116/22  PAD: With ulcer at the distal left first digit, bilateral heel ulceration.  ABI shows moderate to severe peripheral vascular disease.  Vascular surgery was consulted and underwent angiogram via left common femoral artery with balloon lithotripsy of the right SFA and popliteal artery, balloon angioplasty right SFA and drug-eluting stent right SFA by vascular surgery on 08/10/2023.  Continue aspirin Plavix and Crestor as per vascular surgery.  Plan to follow-up in vascular surgery clinic in 4 to 6 weeks for right lower extremity arterial duplex and ABI.  Cervical myelopathy: Appears to have quadriparesis on exam, unknown if related to this past history versus possibly other process like critical illness myopathy from her recent hospitalization.  Will need to continue daily physical therapy on discharge.  Anemia: Likely from chronic disease.  Recent iron indices, B12 folic acid within normal range..  Hemoglobin 9.2.   DVT prophylaxis: heparin injection 5,000 Units Start: 08/11/23 0600   Disposition: Skilled nursing facility.  Patient is medically stable for disposition, communicated with TOC and recommend PT OT evaluation and will need reauthorization.  PT OT has been consulted.  Status is: Inpatient Remains inpatient appropriate because: Status post vascular intervention, need for rehabilitation.    Code Status:     Code  Status: Full Code  Family Communication: None at bedside  Consultants: Vascular surgery  Procedures: ABI Angiogram via left  common femoral artery with balloon lithotripsy of the right SFA and popliteal artery, balloon angioplasty right SFA and drug-eluting stent right SFA by vascular surgery on 08/10/2023.    Anti-infectives:  Tamiflu, Rocephin IV-completed.  Anti-infectives (From admission, onward)    Start     Dose/Rate Route Frequency Ordered Stop   08/09/23 2200  oseltamivir (TAMIFLU) capsule 30 mg  Status:  Discontinued       Placed in "Followed by" Linked Group   30 mg Per Tube 2 times daily 08/09/23 0517 08/09/23 1346   08/09/23 2200  cefTRIAXone (ROCEPHIN) 1 g in sodium chloride 0.9 % 100 mL IVPB        1 g 200 mL/hr over 30 Minutes Intravenous Daily at bedtime 08/09/23 0518 08/11/23 0057   08/09/23 2200  oseltamivir (TAMIFLU) 6 MG/ML suspension 75 mg        75 mg Per Tube 2 times daily 08/09/23 1346 08/14/23 0959   08/09/23 0530  oseltamivir (TAMIFLU) capsule 75 mg       Placed in "Followed by" Linked Group   75 mg Per Tube  Once 08/09/23 0517 08/09/23 0550   08/08/23 2300  cefTRIAXone (ROCEPHIN) 1 g in sodium chloride 0.9 % 100 mL IVPB        1 g 200 mL/hr over 30 Minutes Intravenous  Once 08/08/23 2252 08/08/23 2330       Subjective: Today, patient was seen and examined at bedside.  Poor historian.  Has mild cough.  Denies overt pain.    Objective: Vitals:   08/11/23 0300 08/11/23 0728  BP: (!) 125/58 (!) 116/52  Pulse: 91 84  Resp: 17 20  Temp: 98.6 F (37 C) 98 F (36.7 C)  SpO2: 100% 92%    Intake/Output Summary (Last 24 hours) at 08/11/2023 1120 Last data filed at 08/11/2023 0506 Gross per 24 hour  Intake 3654.05 ml  Output 1600 ml  Net 2054.05 ml   Filed Weights   08/08/23 2118  Weight: 67.8 kg   Body mass index is 26.47 kg/m.   Physical Exam:  GENERAL: Patient is alert awake and oriented to place and person.  Not in obvious distress. HENT: No scleral pallor or icterus. Pupils equally reactive to light. Oral mucosa is moist NECK: is supple, no gross swelling  noted. CHEST: Clear to auscultation. No crackles or wheezes.   CVS: S1 and S2 heard, no murmur. Regular rate and rhythm.  ABDOMEN: Soft, non-tender, bowel sounds are present.  Foley catheter in place.  PEG tube in place. EXTREMITIES: No edema. CNS: Cranial nerves are intact.  Moves all extremities. SKIN: warm and dry, bilateral heel ulcers on presentation, left great toe with black discoloration.  Data Review: I have personally reviewed the following laboratory data and studies,  CBC: Recent Labs  Lab 08/08/23 2225 08/09/23 0541 08/10/23 0621  WBC 6.3 6.4 3.6*  NEUTROABS 4.8  --   --   HGB 10.2* 9.5* 9.2*  HCT 32.2* 29.9* 29.2*  MCV 83.9 84.5 82.5  PLT 318 287 227   Basic Metabolic Panel: Recent Labs  Lab 08/08/23 2225 08/09/23 0541 08/10/23 0621  NA 135 134* 135  K 3.9 3.3* 4.2  CL 96* 98 101  CO2 27 25 22   GLUCOSE 144* 102* 200*  BUN 30* 27* 22  CREATININE 0.93 0.82 0.74  CALCIUM 9.1 8.5* 8.8*  MG  --  1.6*  --   PHOS  --  3.2  --    Liver Function Tests: Recent Labs  Lab 08/08/23 2225 08/10/23 0621  AST 48* 93*  ALT 38 67*  ALKPHOS 88 75  BILITOT 0.5 0.6  PROT 7.2 6.2*  ALBUMIN 2.6* 2.3*   No results for input(s): "LIPASE", "AMYLASE" in the last 168 hours. No results for input(s): "AMMONIA" in the last 168 hours. Cardiac Enzymes: No results for input(s): "CKTOTAL", "CKMB", "CKMBINDEX", "TROPONINI" in the last 168 hours. BNP (last 3 results) Recent Labs    05/04/23 0234 05/05/23 1120 05/06/23 0551  BNP 971.2* 1,106.7* 665.9*    ProBNP (last 3 results) No results for input(s): "PROBNP" in the last 8760 hours.  CBG: Recent Labs  Lab 08/10/23 0838 08/10/23 1216 08/10/23 1630 08/10/23 2202 08/11/23 0649  GLUCAP 182* 324* 263* 172* 184*   Recent Results (from the past 240 hours)  Urine Culture     Status: Abnormal   Collection Time: 08/08/23 10:00 PM   Specimen: Urine, Random  Result Value Ref Range Status   Specimen Description   Final     URINE, RANDOM Performed at [facility], [address], [place], [place] [address]    Special Requests   Final    NONE Reflexed from [id number] Performed at [facility], [address] [place], [place] [address]    Culture (A)  Final    >=100,000 COLONIES/mL ESCHERICHIA COLI 90,000 COLONIES/mL PSEUDOMONAS AERUGINOSA    Report Status 08/10/2023 FINAL  Final   Organism ID, Bacteria ESCHERICHIA COLI (A)  Final   Organism ID, Bacteria PSEUDOMONAS AERUGINOSA (A)  Final      Susceptibility   Escherichia coli - MIC*    AMPICILLIN 4 SENSITIVE Sensitive     CEFAZOLIN <=4 SENSITIVE Sensitive     CEFEPIME <=0.12 SENSITIVE Sensitive     CEFTRIAXONE <=0.25 SENSITIVE Sensitive     CIPROFLOXACIN <=0.25 SENSITIVE Sensitive     GENTAMICIN <=1 SENSITIVE Sensitive     IMIPENEM <=0.25 SENSITIVE Sensitive     NITROFURANTOIN <=16 SENSITIVE Sensitive     TRIMETH/SULFA <=20 SENSITIVE Sensitive     AMPICILLIN/SULBACTAM <=2 SENSITIVE Sensitive     PIP/TAZO <=4 SENSITIVE Sensitive ug/mL    * >=100,000 COLONIES/mL ESCHERICHIA COLI   Pseudomonas aeruginosa - MIC*    CEFTAZIDIME 4 SENSITIVE Sensitive     CIPROFLOXACIN <=0.25 SENSITIVE Sensitive     GENTAMICIN <=1 SENSITIVE Sensitive     IMIPENEM 2 SENSITIVE Sensitive     PIP/TAZO 8 SENSITIVE Sensitive ug/mL    CEFEPIME 2 SENSITIVE Sensitive     * 90,000 COLONIES/mL PSEUDOMONAS AERUGINOSA  Resp panel by RT-PCR (RSV, Flu A&B, Covid) Anterior Nasal Swab     Status: Abnormal   Collection Time: 08/08/23 10:25 PM   Specimen: Anterior Nasal Swab  Result Value Ref Range Status   SARS Coronavirus 2 by RT PCR NEGATIVE NEGATIVE Final    Comment: (NOTE) SARS-CoV-2 target nucleic acids are NOT DETECTED.  The SARS-CoV-2 RNA is generally detectable in upper respiratory specimens during the acute phase of infection. The lowest concentration of SARS-CoV-2 viral copies this assay can detect is 138 copies/mL. A negative result  does not preclude SARS-Cov-2 infection and should not be used as the sole basis for treatment or other patient management decisions. A negative result may occur with  improper specimen collection/handling, submission of specimen other than nasopharyngeal swab, presence of viral mutation(s) within the areas targeted by this assay, and  inadequate number of viral copies(<138 copies/mL). A negative result must be combined with clinical observations, patient history, and epidemiological information. The expected result is Negative.  Fact Sheet for Patients:  BloggerCourse.com  Fact Sheet for Healthcare Providers:  SeriousBroker.it  This test is no t yet approved or cleared by the [place] FDA and  has been authorized for detection and/or diagnosis of SARS-CoV-2 by FDA under an Emergency Use Authorization (EUA). This EUA will remain  in effect (meaning this test can be used) for the duration of the COVID-19 declaration under Section 564(b)(1) of the Act, 21 U.S.C.section 360bbb-3(b)(1), unless the authorization is terminated  or revoked sooner.       Influenza A by PCR POSITIVE (A) NEGATIVE Final   Influenza B by PCR NEGATIVE NEGATIVE Final    Comment: (NOTE) The Xpert Xpress SARS-CoV-2/FLU/RSV plus assay is intended as an aid in the diagnosis of influenza from Nasopharyngeal swab specimens and should not be used as a sole basis for treatment. Nasal washings and aspirates are unacceptable for Xpert Xpress SARS-CoV-2/FLU/RSV testing.  Fact Sheet for Patients: BloggerCourse.com  Fact Sheet for Healthcare Providers: SeriousBroker.it  This test is not yet approved or cleared by the [place] FDA and has been authorized for detection and/or diagnosis of SARS-CoV-2 by FDA under an Emergency Use Authorization (EUA). This EUA will remain in effect (meaning this test can be used)  for the duration of the COVID-19 declaration under Section 564(b)(1) of the Act, 21 U.S.C. section 360bbb-3(b)(1), unless the authorization is terminated or revoked.     Resp Syncytial Virus by PCR NEGATIVE NEGATIVE Final    Comment: (NOTE) Fact Sheet for Patients: BloggerCourse.com  Fact Sheet for Healthcare Providers: SeriousBroker.it  This test is not yet approved or cleared by the [place] FDA and has been authorized for detection and/or diagnosis of SARS-CoV-2 by FDA under an Emergency Use Authorization (EUA). This EUA will remain in effect (meaning this test can be used) for the duration of the COVID-19 declaration under Section 564(b)(1) of the Act, 21 U.S.C. section 360bbb-3(b)(1), unless the authorization is terminated or revoked.  Performed at [facility], [address] [place], [place] [address]   Culture, blood (routine x 2)     Status: None (Preliminary result)   Collection Time: 08/08/23 10:26 PM   Specimen: BLOOD  Result Value Ref Range Status   Specimen Description   Final    BLOOD LEFT ANTECUBITAL Performed at [facility], [address] [place], [place] [address]    Special Requests   Final    BOTTLES DRAWN AEROBIC ONLY Blood Culture results may not be optimal due to an inadequate volume of blood received in culture bottles Performed at [facility], [address], [place], [place] [address]    Culture   Final    NO GROWTH 3 DAYS Performed at [facility], [address], [place], [place] [address]    Report Status PENDING  Incomplete  Culture, blood (routine x 2)     Status: None (Preliminary result)   Collection Time: 08/08/23 10:45 PM   Specimen: BLOOD  Result Value Ref Range Status   Specimen Description   Final    BLOOD BLOOD LEFT HAND Performed at [facility], [address], [place], [place] [address]     Special Requests   Final    BOTTLES DRAWN AEROBIC AND ANAEROBIC Blood Culture results may not be optimal due to an inadequate  volume of blood received in culture bottles Performed at [facility], [address], [place], [place] [address]    Culture   Final    NO GROWTH 3 DAYS Performed at [facility], [address] [place], [place] [address]    Report Status PENDING  Incomplete     Studies: PERIPHERAL VASCULAR CATHETERIZATION Result Date: 08/10/2023 Images from the original result were not included. Patient name: Katherine Navarro MRN: [id number] DOB: Sep 24, 1955 Sex: female 08/10/2023 Pre-operative Diagnosis: Bilateral lower extremity critical and ischemia with tissue loss Post-operative diagnosis:  Same Surgeon:  Victorino Sparrow, MD Procedure Performed: 1.  Ultrasound-guided micropuncture access of the left common femoral artery 2.  Aortogram 3.  Secondary cannulation, right lower extremity angiogram 4.  Third order cannulation, right lower extremity angiogram 5.  Balloon lithotripsy superficial femoral artery, popliteal artery, 3 x 80 mm shockwave 6.  Balloon angioplasty the superficial femoral artery 4 x 80 mm 7.  Drug-eluting stent 5 x 60 mm Cook Zilver to the superficial femoral artery 8.  Left lower extremity angiogram Moderate sedation time 86 minutes, contrast volume 155 mL Device assisted closure-Mynx Indications: Patient is a 68 year old female who is nonambulatory, but able to stand and pivot who presents with bilateral lower extremity wounds, nonpalpable pulses.  ABIs demonstrated monophasic waveforms with no toe pressure appreciated.  The left heel wound does not appear salvageable, the right appears more superficial.  After discussing risks and benefits of bilateral lower extremity angiogram with emphasis on the right in an effort to define, and improve perfusion for wound healing, the patient elected to proceed. Findings: Aortogram: Widely patent renal  arteries bilaterally, no flow-limiting stenosis in aortoiliac segments bilaterally. On the right: Small vasculature.  Widely patent right common femoral artery, profunda.  The superficial femoral artery had moderate disease with multiple areas of tandem, flow-limiting stenosis greater than 70%.  This continued into the popliteal artery at the P1 and P2 segments.  Two-vessel outflow, anterior tibial artery, peroneal artery.  The posterior tibial artery is occluded at its ostia.  Filling of the foot is through peroneal collaterals and the dorsalis pedis, which is small and atretic.  Severe small vessel disease in the foot.  No inline flow to the heel. On the left: Small vasculature.  Widely patent common femoral artery, profunda and the superficial femoral artery demonstrates less disease than the right leg.  Patent popliteal artery.  Atretic anterior tibial artery, occluding at the distal tibia.  Severe disease in the tibioperoneal trunk with tandem lesions demonstrating greater than 90% stenosis.  Single-vessel peroneal runoff to the foot.  Procedure:  The patient was identified in the holding area and taken to room 8.  The patient was then placed supine on the table and prepped and draped in the usual sterile fashion.  A time out was called.  Ultrasound was used to evaluate the left common femoral artery.  It was patent .  A digital ultrasound image was acquired.  A micropuncture needle was used to access the left common femoral artery under ultrasound guidance.  An 018 wire was advanced without resistance and a micropuncture sheath was placed.  The 018 wire was removed and a benson wire was placed.  The micropuncture sheath was exchanged for a 5 french sheath.  An omniflush catheter was advanced over the wire to the level of L-1.  An abdominal angiogram was obtained.  Next, using the omniflush catheter and a benson wire, the aortic bifurcation was  crossed and the catheter was placed into theright external iliac  artery and right runoff was obtained.  left runoff was performed via retrograde sheath injections. I elected to attempt intervention on the multiple areas of greater than 70% stenosis appreciated in the superficial femoral artery and popliteal artery.  The patient was heparinized and a 6 x 60 cm sheath was brought onto the field and parked in the mid superficial femoral artery.  Angiography followed.  There appeared to be an intimal flap which occurred due to the small size of the artery and the sheath.  The sheath was pulled back.  Due to the small size of the arteries, I elected to start with a small balloon, and I was worried about dissection, therefore I elected to use balloon lithotripsy with an inflation of 2 atm.  A 3 x 80 mm balloon was brought onto the field and expanded along the superficial femoral artery with 40 pulses, followed by popliteal artery with 40 pulses.  This demonstrated significant improvement with resolution of flow-limiting stenosis.  There was still the intimal lesion that appeared to be a dissection in the mid SFA which occurred due to the sheath and the small artery size.  I placed a 4 x 60 mm balloon over the lesion and inflated it for 3 minutes x 2.  The lesion was still present.  It appeared                  flow-limiting. I elected to place a 5 x 60 mm Cook Zilver drug-eluting stent.  This was postdilated with a 4 x 60 mm balloon with an excellent result.  Completion angiogram followed demonstrating no change in outflow, with increase flow through the superficial femoral artery. At this point, the sheath was pulled back and left lower extremity angiogram followed.  See results above. The patient was closed using a minx device without issue. I am very concerned, the patient will not have enough blood flow for healing of bilateral lower extremity heel wounds.  These are pressure induced due to being nonambulatory.  She has no inline flow to the the heels bilaterally.  She is optimized  from a vascular surgery perspective in the right leg.  The left leg has not been revascularized, but in its current state my partner Dr. Randie Heinz, who evaluated the wound, felt that it was nonsalvageable.  No plan for further revascularization at this time. Patient will be managed on aspirin, Plavix, high intensity statin. Victorino Sparrow MD [facility] [place] Office: [phone]      Joycelyn Das, MD  [facility] 08/11/2023  If 7PM-7AM, please contact night-coverage

## 2023-08-11 NOTE — Progress Notes (Addendum)
  Progress Note    08/11/2023 6:48 AM 1 Day Post-Op  Subjective:  says her right foot feels better  Tm 99.1 now afebrile   Vitals:   08/11/23 0036 08/11/23 0300  BP: 126/62 (!) 125/58  Pulse: 96 91  Resp: 17 17  Temp: 99.1 F (37.3 C) 98.6 F (37 C)  SpO2: 95% 100%    Physical Exam: General:  no distress Cardiac:  regular Lungs:  non labored Incisions:  left groin is soft without hematoma Extremities:  + doppler flow right DP and peroneal   CBC    Component Value Date/Time   WBC 3.6 (L) 08/10/2023 0621   RBC 3.54 (L) 08/10/2023 0621   HGB 9.2 (L) 08/10/2023 0621   HCT 29.2 (L) 08/10/2023 0621   PLT 227 08/10/2023 0621   MCV 82.5 08/10/2023 0621   MCH 26.0 08/10/2023 0621   MCHC 31.5 08/10/2023 0621   RDW 14.5 08/10/2023 0621   LYMPHSABS 0.9 08/08/2023 2225   MONOABS 0.7 08/08/2023 2225   EOSABS 0.0 08/08/2023 2225   BASOSABS 0.0 08/08/2023 2225    BMET    Component Value Date/Time   NA 135 08/10/2023 0621   K 4.2 08/10/2023 0621   CL 101 08/10/2023 0621   CO2 22 08/10/2023 0621   GLUCOSE 200 (H) 08/10/2023 0621   BUN 22 08/10/2023 0621   CREATININE 0.74 08/10/2023 0621   CALCIUM 8.8 (L) 08/10/2023 0621   GFRNONAA >60 08/10/2023 0621   GFRAA >60 02/07/2020 1347    INR    Component Value Date/Time   INR 0.9 05/18/2023 0554     Intake/Output Summary (Last 24 hours) at 08/11/2023 0648 Last data filed at 08/11/2023 0506 Gross per 24 hour  Intake 3654.05 ml  Output 1600 ml  Net 2054.05 ml      Assessment/Plan:  68 y.o. female is s/p:  Angiogram via left CFA with balloon lithotripsy right SFA and popliteal artery, balloon angioplasty right SFA and drug eluting stent right SFA 1 Day Post-Op   -pt with + doppler flow right DP and peroneal and subjectively, her right foot feels better -continue asa/plavix/high intensity statin -she will f/u in our office in 4-6 weeks for RLE arterial duplex and ABI.   Doreatha Massed, PA-C [facility] [phone] 08/11/2023 6:48 AM  I have independently interviewed and examined patient and agree with PA assessment and plan above. She has brisk right dp and peroneal signals with a peroneal that fills the PT distally at the heel and the heel ulcer is superficial on the right.  On the left there is a very deep ulcer with no filling of any heel vessels on angio yesterday and as such there is no indication for repeat angiography to evaluate the left.  I have discussed the case with the patient and her husband Christen Bame and have asked Dr. Lajoyce Corners to evaluate options for her lower extremities.  Shant Hence C. Randie Heinz, MD [facility] [place] Office: [phone] Pager: [phone]

## 2023-08-12 DIAGNOSIS — I96 Gangrene, not elsewhere classified: Secondary | ICD-10-CM | POA: Diagnosis not present

## 2023-08-12 DIAGNOSIS — J101 Influenza due to other identified influenza virus with other respiratory manifestations: Secondary | ICD-10-CM | POA: Diagnosis not present

## 2023-08-12 LAB — GLUCOSE, CAPILLARY
Glucose-Capillary: 390 mg/dL — ABNORMAL HIGH (ref 70–99)
Glucose-Capillary: 412 mg/dL — ABNORMAL HIGH (ref 70–99)
Glucose-Capillary: 453 mg/dL — ABNORMAL HIGH (ref 70–99)
Glucose-Capillary: 379 mg/dL — ABNORMAL HIGH (ref 70–99)

## 2023-08-12 MED ORDER — ROSUVASTATIN CALCIUM 10 MG PO TABS: 10.0000 mg | ORAL_TABLET | Freq: Every day | ORAL | Status: DC

## 2023-08-12 MED ORDER — OSELTAMIVIR PHOSPHATE 6 MG/ML PO SUSR: 75.0000 mg | Freq: Two times a day (BID) | ORAL | Status: AC

## 2023-08-12 MED ORDER — INSULIN ASPART 100 UNIT/ML IJ SOLN: 0.0000 [IU] | INTRAMUSCULAR | Status: DC

## 2023-08-12 MED ORDER — CLOPIDOGREL BISULFATE 75 MG PO TABS: 75.0000 mg | ORAL_TABLET | Freq: Every day | ORAL | Status: DC

## 2023-08-12 MED ORDER — INSULIN ASPART 100 UNIT/ML IJ SOLN
0.0000 [IU] | Freq: Four times a day (QID) | INTRAMUSCULAR | Status: DC
  Administered 2023-08-12: 15 [IU] via SUBCUTANEOUS

## 2023-08-12 MED ORDER — ALBUTEROL SULFATE (2.5 MG/3ML) 0.083% IN NEBU: 2.5000 mg | INHALATION_SOLUTION | RESPIRATORY_TRACT | Status: AC | PRN

## 2023-08-12 MED ORDER — INSULIN ASPART 100 UNIT/ML IJ SOLN
10.0000 [IU] | Freq: Once | INTRAMUSCULAR | Status: AC
  Administered 2023-08-12: 10 [IU] via SUBCUTANEOUS

## 2023-08-12 NOTE — Plan of Care (Signed)
  Problem: Fluid Volume: Goal: Ability to maintain a balanced intake and output will improve Outcome: Progressing   Problem: Metabolic: Goal: Ability to maintain appropriate glucose levels will improve Outcome: Progressing   Problem: Skin Integrity: Goal: Risk for impaired skin integrity will decrease Outcome: Progressing   Problem: Clinical Measurements: Goal: Ability to maintain clinical measurements within normal limits will improve Outcome: Progressing Goal: Cardiovascular complication will be avoided Outcome: Progressing   Problem: Activity: Goal: Risk for activity intolerance will decrease Outcome: Progressing   Problem: Activity: Goal: Ability to return to baseline activity level will improve Outcome: Progressing   Problem: Cardiovascular: Goal: Ability to achieve and maintain adequate cardiovascular perfusion will improve Outcome: Progressing Goal: Vascular access site(s) Level 0-1 will be maintained Outcome: Progressing   Problem: Health Behavior/Discharge Planning: Goal: Ability to safely manage health-related needs after discharge will improve Outcome: Progressing

## 2023-08-12 NOTE — TOC Progression Note (Signed)
Transition of Care [facility]) - Progression Note    Patient Details  Name: Katherine Navarro MRN: [id number] Date of Birth: 1955-08-10  Transition of Care [facility]) CM/SW Contact  Eduard Roux, [place] Phone Number: 08/12/2023, 12:06 PM  Clinical Narrative:     Per chart review patient AX1- spoke with patient's spouse, introduced self and explained role. He confirmed patient arrived from [facility] and wants her to return there once stable.He reports she has been there since 04/2023.    CSW started Building surveyor # [id number].  Contacted [facility]- informed of anticipated d/c today. SNF confirmed they can return today if stable.   MD updated   TOC will continue to follow and assist with discharge planning.      Barriers to Discharge: Insurance Authorization  Expected Discharge Plan and Services                                               Social Determinants of Health (SDOH) Interventions SDOH Screenings   Food Insecurity: No Food Insecurity (08/09/2023)  Housing: Low Risk  (08/09/2023)  Transportation Needs: No Transportation Needs (08/09/2023)  Utilities: Not At Risk (08/09/2023)  Depression (PHQ2-9): Low Risk  (10/31/2019)  Social Connections: Moderately Integrated (08/09/2023)  Tobacco Use: Low Risk  (08/09/2023)    Readmission Risk Interventions     No data to display

## 2023-08-12 NOTE — Consult Note (Signed)
ORTHOPAEDIC CONSULTATION  REQUESTING PHYSICIAN: Joycelyn Das, MD  Chief Complaint: Dry gangrene left heel.  HPI: Katherine Navarro is a 68 y.o. female who presents with diabetes with peripheral vascular disease status post endovascular revascularization to the right lower extremity.  Endovascular evaluation of the left lower extremity shows no microcirculation to the hindfoot.  Past Medical History:  Diagnosis Date   Complication of anesthesia    trouble waking up   CONSTIPATION, CHRONIC 12/13/2007   DIABETES MELLITUS, TYPE I 01/07/2007   GLAUCOMA 12/13/2007   HYPERCHOLESTEROLEMIA 12/13/2007   Hypertension    Hyperthyroidism    INTERNAL HEMORRHOIDS 07/23/2008   LEUKOPENIA, CHRONIC 12/13/2007   Nonproliferative diabetic retinopathy NOS(362.03) 12/13/2007   Unspecified hypothyroidism 12/13/2007   VARICOSE VEINS, LOWER EXTREMITIES 12/13/2007   Past Surgical History:  Procedure Laterality Date   ABDOMINAL AORTOGRAM W/LOWER EXTREMITY N/A 08/10/2023   Procedure: ABDOMINAL AORTOGRAM W/ BILATERAL LOWER EXTREMITY RUNOFF;  Surgeon: Victorino Sparrow, MD;  Location: [facility] INVASIVE CV LAB;  Service: Cardiovascular;  Laterality: N/A;   ANTERIOR CERVICAL DECOMP/DISCECTOMY FUSION N/A 02/06/2020   Procedure: Anterior Cervical Decompression Discectomy Fusion Cervical five-six;  Surgeon: Donalee Citrin, MD;  Location: [facility] OR;  Service: Neurosurgery;  Laterality: N/A;   ELECTROCARDIOGRAM  08/24/2006   EYE SURGERY     bilateral   IR GASTROSTOMY TUBE MOD SED  05/18/2023   LEEP N/A 12/01/2012   Procedure: LOOP ELECTROSURGICAL EXCISION PROCEDURE (LEEP);  Surgeon: Purcell Nails, MD;  Location: [facility] ORS;  Service: Gynecology;  Laterality: N/A;   PERIPHERAL INTRAVASCULAR LITHOTRIPSY  08/10/2023   Procedure: PERIPHERAL INTRAVASCULAR LITHOTRIPSY;  Surgeon: Victorino Sparrow, MD;  Location: [facility] INVASIVE CV LAB;  Service: Cardiovascular;;  Rt SFA   PERIPHERAL VASCULAR BALLOON ANGIOPLASTY  08/10/2023   Procedure:  PERIPHERAL VASCULAR BALLOON ANGIOPLASTY;  Surgeon: Victorino Sparrow, MD;  Location: [facility] INVASIVE CV LAB;  Service: Cardiovascular;;   PERIPHERAL VASCULAR INTERVENTION  08/10/2023   Procedure: PERIPHERAL VASCULAR INTERVENTION;  Surgeon: Victorino Sparrow, MD;  Location: [facility] INVASIVE CV LAB;  Service: Cardiovascular;;   Stress Myoview   05/18/2004   TUBAL LIGATION     Social History   Socioeconomic History   Marital status: Married    Spouse name: Not on file   Number of children: Not on file   Years of education: Not on file   Highest education level: Not on file  Occupational History    Comment: Doesn't work outside the home  Tobacco Use   Smoking status: Never   Smokeless tobacco: Never  Vaping Use   Vaping status: Never Used  Substance and Sexual Activity   Alcohol use: Not Currently    Alcohol/week: 1.0 standard drink of alcohol    Types: 1 Glasses of wine per week    Comment: occasional   Drug use: No   Sexual activity: Not Currently  Other Topics Concern   Not on file  Social History Narrative   Pt gets regular exercise.         Social Drivers of Corporate investment banker Strain: Not on file  Food Insecurity: No Food Insecurity (08/09/2023)   Hunger Vital Sign    Worried About Running Out of Food in the Last Year: Never true    Ran Out of Food in the Last Year: Never true  Transportation Needs: No Transportation Needs (08/09/2023)   PRAPARE - Administrator, Civil Service (Medical): No    Lack of Transportation (Non-Medical): No  Physical  Activity: Not on file  Stress: Not on file  Social Connections: Moderately Integrated (08/09/2023)   Social Connection and Isolation Panel [NHANES]    Frequency of Communication with Friends and Family: Once a week    Frequency of Social Gatherings with Friends and Family: Never    Attends Religious Services: 1 to 4 times per year    Active Member of [employer] or Organizations: Yes    Attends Banker  Meetings: Never    Marital Status: Married   Family History  Problem Relation Age of Onset   Cancer Mother        Breast Cancer   Diabetes Mother    Breast cancer Mother    Diabetes Father    - negative except otherwise stated in the family history section Allergies  Allergen Reactions   Atorvastatin     REACTION: perceived myalgias   Ciprofloxacin Nausea And Vomiting   Epinephrine     REACTION: thyroid problems   Erythromycin     Can not recall   Morphine Nausea Only    Headache   Peanut-Containing Drug Products Hives   Penicillins Hives   Prior to Admission medications   Medication Sig Start Date End Date Taking? Authorizing Provider  aspirin 81 MG chewable tablet Place 81 mg into feeding tube daily.   Yes [provider]  famotidine (PEPCID) 20 MG tablet Take 1 tablet (20 mg total) by mouth daily. Patient taking differently: Place 20 mg into feeding tube daily. 05/20/23  Yes Ghimire, Werner Lean, MD  folic acid (FOLVITE) 1 MG tablet Take 2 tablets (2 mg total) by mouth daily. Patient taking differently: Place 1 mg into feeding tube daily. 05/20/23  Yes Ghimire, Werner Lean, MD  insulin aspart (NOVOLOG) 100 UNIT/ML injection 0-15 Units, Subcutaneous, Every 4 hours, CBG < 70: Implement Hypoglycemia measures CBG 70 - 120: 0 units CBG 121 - 150: 2 units CBG 151 - 200: 3 units CBG 201 - 250: 5 units CBG 251 - 300: 8 units CBG 301 - 350: 11 units CBG 351 - 400: 15 units CBG > 400: call MD and obtain STAT lab verification Patient taking differently: Inject 0-17 Units into the skin every 4 (four) hours. 0-15 Units, Subcutaneous, Every 4 hours, CBG < 70: Implement Hypoglycemia measures CBG 70 - 120: 0 units CBG 121 - 150: 2 units CBG 151 - 200: 3 units CBG 201 - 250: 5 units CBG 251 - 300: 8 units CBG 301 - 350: 11 units CBG 351 - 400: 15 units CBG > 400: call MD and obtain STAT lab verification 05/20/23  Yes Ghimire, Werner Lean, MD  Insulin Glargine (BASAGLAR KWIKPEN) 100 UNIT/ML  Inject 32 Units into the skin daily. 07/25/23  Yes [provider]  lactose free nutrition (BOOST PLUS) LIQD Take 237 mLs by mouth 3 (three) times daily with meals. 05/20/23  Yes Ghimire, Werner Lean, MD  levothyroxine (SYNTHROID) 75 MCG tablet Take 1 tablet (75 mcg total) by mouth daily at 6 (six) AM. Patient taking differently: Take 150 mcg by mouth daily at 6 (six) AM. 05/21/23  Yes Ghimire, Werner Lean, MD  metFORMIN (GLUCOPHAGE) 500 MG tablet Take 500 mg by mouth 2 (two) times daily with a meal.   Yes [provider]  multivitamin (PROSIGHT) TABS tablet Take 1 tablet by mouth daily. Patient taking differently: Take 1 tablet by mouth daily. Via g-tube, not oral 05/20/23  Yes Ghimire, Werner Lean, MD  polyethylene glycol (MIRALAX / GLYCOLAX) 17 g  packet Take 17 g by mouth 2 (two) times daily. 05/20/23  Yes Ghimire, Werner Lean, MD  tamsulosin (FLOMAX) 0.4 MG CAPS capsule Take 1 capsule (0.4 mg total) by mouth daily. Patient taking differently: Take 0.4 mg by mouth daily. Via g-tube 05/20/23  Yes Ghimire, Werner Lean, MD  thiamine (VITAMIN B-1) 100 MG tablet Take 1 tablet (100 mg total) by mouth daily. 05/20/23  Yes Ghimire, Werner Lean, MD  zinc sulfate, 50mg  elemental zinc, 220 (50 Zn) MG capsule Place 220 mg into feeding tube daily.   Yes [provider]  Nutritional Supplements (FEEDING SUPPLEMENT, GLUCERNA 1.5 CAL,) LIQD 65 mL/hr, Continuous, Starting at 1800 daily for 12 Hours Patient not taking: Reported on 08/09/2023 05/20/23   Maretta Bees, MD  Water For Irrigation, Sterile (FREE WATER) SOLN Place 400 mLs into feeding tube every 4 (four) hours. 05/20/23   Ghimire, Werner Lean, MD   PERIPHERAL VASCULAR CATHETERIZATION Result Date: 08/10/2023 Images from the original result were not included. Patient name: KATHRYNE RAMELLA MRN: [id number] DOB: 1956/04/27 Sex: female 08/10/2023 Pre-operative Diagnosis: Bilateral lower extremity critical and ischemia with tissue loss  Post-operative diagnosis:  Same Surgeon:  Victorino Sparrow, MD Procedure Performed: 1.  Ultrasound-guided micropuncture access of the left common femoral artery 2.  Aortogram 3.  Secondary cannulation, right lower extremity angiogram 4.  Third order cannulation, right lower extremity angiogram 5.  Balloon lithotripsy superficial femoral artery, popliteal artery, 3 x 80 mm shockwave 6.  Balloon angioplasty the superficial femoral artery 4 x 80 mm 7.  Drug-eluting stent 5 x 60 mm Cook Zilver to the superficial femoral artery 8.  Left lower extremity angiogram Moderate sedation time 86 minutes, contrast volume 155 mL Device assisted closure-Mynx Indications: Patient is a 68 year old female who is nonambulatory, but able to stand and pivot who presents with bilateral lower extremity wounds, nonpalpable pulses.  ABIs demonstrated monophasic waveforms with no toe pressure appreciated.  The left heel wound does not appear salvageable, the right appears more superficial.  After discussing risks and benefits of bilateral lower extremity angiogram with emphasis on the right in an effort to define, and improve perfusion for wound healing, the patient elected to proceed. Findings: Aortogram: Widely patent renal arteries bilaterally, no flow-limiting stenosis in aortoiliac segments bilaterally. On the right: Small vasculature.  Widely patent right common femoral artery, profunda.  The superficial femoral artery had moderate disease with multiple areas of tandem, flow-limiting stenosis greater than 70%.  This continued into the popliteal artery at the P1 and P2 segments.  Two-vessel outflow, anterior tibial artery, peroneal artery.  The posterior tibial artery is occluded at its ostia.  Filling of the foot is through peroneal collaterals and the dorsalis pedis, which is small and atretic.  Severe small vessel disease in the foot.  No inline flow to the heel. On the left: Small vasculature.  Widely patent common femoral artery,  profunda and the superficial femoral artery demonstrates less disease than the right leg.  Patent popliteal artery.  Atretic anterior tibial artery, occluding at the distal tibia.  Severe disease in the tibioperoneal trunk with tandem lesions demonstrating greater than 90% stenosis.  Single-vessel peroneal runoff to the foot.  Procedure:  The patient was identified in the holding area and taken to room 8.  The patient was then placed supine on the table and prepped and draped in the usual sterile fashion.  A time out was called.  Ultrasound was used to evaluate the left common femoral artery.  It was patent .  A digital ultrasound image was acquired.  A micropuncture needle was used to access the left common femoral artery under ultrasound guidance.  An 018 wire was advanced without resistance and a micropuncture sheath was placed.  The 018 wire was removed and a benson wire was placed.  The micropuncture sheath was exchanged for a 5 french sheath.  An omniflush catheter was advanced over the wire to the level of L-1.  An abdominal angiogram was obtained.  Next, using the omniflush catheter and a benson wire, the aortic bifurcation was crossed and the catheter was placed into theright external iliac artery and right runoff was obtained.  left runoff was performed via retrograde sheath injections. I elected to attempt intervention on the multiple areas of greater than 70% stenosis appreciated in the superficial femoral artery and popliteal artery.  The patient was heparinized and a 6 x 60 cm sheath was brought onto the field and parked in the mid superficial femoral artery.  Angiography followed.  There appeared to be an intimal flap which occurred due to the small size of the artery and the sheath.  The sheath was pulled back.  Due to the small size of the arteries, I elected to start with a small balloon, and I was worried about dissection, therefore I elected to use balloon lithotripsy with an inflation of 2 atm.   A 3 x 80 mm balloon was brought onto the field and expanded along the superficial femoral artery with 40 pulses, followed by popliteal artery with 40 pulses.  This demonstrated significant improvement with resolution of flow-limiting stenosis.  There was still the intimal lesion that appeared to be a dissection in the mid SFA which occurred due to the sheath and the small artery size.  I placed a 4 x 60 mm balloon over the lesion and inflated it for 3 minutes x 2.  The lesion was still present.  It appeared                  flow-limiting. I elected to place a 5 x 60 mm Cook Zilver drug-eluting stent.  This was postdilated with a 4 x 60 mm balloon with an excellent result.  Completion angiogram followed demonstrating no change in outflow, with increase flow through the superficial femoral artery. At this point, the sheath was pulled back and left lower extremity angiogram followed.  See results above. The patient was closed using a minx device without issue. I am very concerned, the patient will not have enough blood flow for healing of bilateral lower extremity heel wounds.  These are pressure induced due to being nonambulatory.  She has no inline flow to the the heels bilaterally.  She is optimized from a vascular surgery perspective in the right leg.  The left leg has not been revascularized, but in its current state my partner Dr. Randie Heinz, who evaluated the wound, felt that it was nonsalvageable.  No plan for further revascularization at this time. Patient will be managed on aspirin, Plavix, high intensity statin. Victorino Sparrow MD [facility] Office: [phone]   - pertinent xrays, CT, MRI studies were reviewed and independently interpreted  Positive ROS: All other systems have been reviewed and were otherwise negative with the exception of those mentioned in the HPI and as above.  Physical Exam: General: Alert, no acute distress Psychiatric: Patient is competent for  consent with normal mood and affect Lymphatic: No axillary or cervical lymphadenopathy Cardiovascular:  No pedal edema Respiratory: No cyanosis, no use of accessory musculature GI: No organomegaly, abdomen is soft and non-tender    Images:  @ENCIMAGES @  Labs:  Lab Results  Component Value Date   HGBA1C 9.9 (H) 04/24/2023   HGBA1C 10.5 (H) 04/23/2023   HGBA1C 10.0 (A) 03/19/2021   ESRSEDRATE 23 (H) 05/14/2013   REPTSTATUS PENDING 08/08/2023   CULT  08/08/2023    NO GROWTH 3 DAYS Performed at [facility], [address], [place], [place] [address]    LABORGA ESCHERICHIA COLI (A) 08/08/2023   LABORGA PSEUDOMONAS AERUGINOSA (A) 08/08/2023    Lab Results  Component Value Date   ALBUMIN 2.3 (L) 08/10/2023   ALBUMIN 2.6 (L) 08/08/2023   ALBUMIN 3.0 (L) 04/24/2023        Latest Ref Rng & Units 08/10/2023    6:21 AM 08/09/2023    5:41 AM 08/08/2023   10:25 PM  CBC EXTENDED  WBC 4.0 - 10.5 K/uL 3.6  6.4  6.3   RBC 3.87 - 5.11 MIL/uL 3.54  3.54  3.84   Hemoglobin 12.0 - 15.0 g/dL 9.2  9.5  60.4   HCT 54.0 - 46.0 % 29.2  29.9  32.2   Platelets 150 - 400 K/uL 227  287  318   NEUT# 1.7 - 7.7 K/uL   4.8   Lymph# 0.7 - 4.0 K/uL   0.9     Neurologic: Patient does not have protective sensation bilateral lower extremities.   MUSCULOSKELETAL:   Skin: Examination patient has full-thickness dry gangrenous changes of the left heel pad.  There is ischemic changes to the left great toe.  Patient's hemoglobin A1c consistently around 10.  Patient does not have a palpable pulse on the left.  Hemoglobin 9.2 with a white cell count of 3.6.  Albumin 2.3.  Assessment: Assessment: Diabetes with severe peripheral vascular disease with full-thickness gangrenous ulcer to the left heel and gangrenous ulcer to the left great toe.  Plan: Plan: Discussed with the patient a lack of microcirculation to the left foot.  There are not foot salvage intervention options for the left foot and  have recommended a transtibial amputation on the left.  Discussed risk and benefits including risk of the wound not healing need for additional surgery.  Patient states she understands states she will not consider surgery at this time.  I will be available for further discussions with patient and her family and to provide surgical intervention as requested.  Thank you for the consult and the opportunity to see Ms. Shirley Friar, MD [facility] [phone] 7:31 AM

## 2023-08-12 NOTE — Inpatient Diabetes Management (Signed)
Inpatient Diabetes Program Recommendations  AACE/ADA: New Consensus Statement on Inpatient Glycemic Control (2015)  Target Ranges:  Prepandial:   less than 140 mg/dL      Peak postprandial:   less than 180 mg/dL (1-2 hours)      Critically ill patients:  140 - 180 mg/dL   Lab Results  Component Value Date   GLUCAP 453 (H) 08/12/2023   HGBA1C 9.9 (H) 04/24/2023    Review of Glycemic Control  Latest Reference Range & Units 08/11/23 21:41 08/12/23 06:04 08/12/23 06:06 08/12/23 12:05  Glucose-Capillary 70 - 99 mg/dL 960 (H) 454 (H) 098 (H) 453 (H)  (H): Data is abnormally high Diabetes history: Type 2 DM Outpatient Diabetes medications: Basaglar 32 units every day, Metformin 500 mg BID, Novolog 0-15 units Q4H Current orders for Inpatient glycemic control: Semglee 28 units every day, Novolog 0-6 units Q6H Novolog 10 units x 1 Glucerna QID   Inpatient Diabetes Program Recommendations:    Noted increase to Semglee.  Also consider: -Changing correction to Q4H -Adding Novolog 3 units QID to be scheduled with Glucerna feeds  Thanks, Lujean Rave, MSN, RNC-OB Diabetes Coordinator [phone] (8a-5p)

## 2023-08-12 NOTE — Discharge Summary (Signed)
Physician Discharge Summary  Katherine Navarro QMV:[id number] DOB: 1956/01/26 DOA: 08/08/2023  PCP: No primary care provider on file.  Admit date: 08/08/2023 Discharge date: 08/12/2023  Admitted From: SNF  Discharge disposition: SNF   Recommendations for Outpatient Follow-Up:   Follow up with your primary care provider at the skilled nursing facility in 3-5 days. Check CBC, BMP, magnesium in the next visit Patient was offered amputation on the left lower extremity but has refused it.  Has been started on aspirin and Plavix with Crestor which she has tolerated.  Plan for vascular surgery follow-up in 4 weeks.  If clinical condition worsens might need to continue having conversations about amputation.   Discharge Diagnosis:   Principal Problem:   Influenza A Active Problems:   UTI (urinary tract infection)   Acute encephalopathy   Encephalopathy   Gangrene of left foot (HCC)   Discharge Condition: Improved.  Diet recommendation:   Carbohydrate-modified.  Tube feeding  Wound care: None.  Code status: Full.   History of Present Illness:   Katherine Navarro is a 68 y.o. female with hx of recent admission 11/1-11/29 for severe hypothyroidism, myxedema, wernickes encephalopathy, and failure to thrive requiring PEG tube placement, urinary retention now with chronic foley;diabetes type 1, hypertension, hypothyroidism, PAD, cervical myelopathy, anemia, who was brought in from [facility] due to altered mental status and fever.  Unable to provide history on presentation.  In the ED, patient was afebrile.  Labs were notable for urinalysis with more than 50 white cells.  CBC without leukocytosis but hemoglobin of 10.2.   Lactate was elevated at 2.1.  Patient tested positive for influenza A.  TSH of 3.6.  Magnesium was 1.6 on presentation and potassium was 3.3.  Blood cultures were sent from the ED and patient was admitted hospital for further evaluation and treatment.   Hospital  Course:   Following conditions were addressed during hospitalization as listed below,  Sepsis secondary to Influenza A  Patient met sepsis criteria on presentation with tachycardia, tachypnea leukopenia, fever with elevated lactic acid with influenza infection. Chest x-ray without any infiltrate.  Continue Tamiflu, albuterol as needed, incentive spirometry, discharge.  Continue Tamiflu to complete the course.  On room air.   Catheter associated UTI. History chronic Foley, exchanged in the ED with subsequent UA suspicious for UTI.  Initially received Rocephin IV, urine culture showing E. coli and Pseudomonas.  Off antibiotic.  Blood cultures negative in 4 days.   Acute infectious/metabolic encephalopathy. Improved.  At baseline patient is alert awake and oriented to time place and person. Delirium precautions, melatonin nightly    failure to thrive status post PEG tube On tube feeding.  Seen by dietitian.  At this time on Glucerna.  Will need insulin coverage for hyperglycemia..  Hypothyroidism, with recent severe hypothyroidism/myxedema:  TSH within normal limits, continue levothyroxine 150 mcg daily.    History of Wernicke's encephalopathy.  Continue thiamine daily    Urinary retention with chronic Foley: Foley exchanged in ED.  Will continue on discharge   Diabetes type 1 with hyperglycemia.  Patient is on Semglee 32 units in the morning, sliding scale at home every 4 hourly.  Will resume same regimen on discharge.  Likely hypoglycemic during hospitalization.   Hypertension: Not currently on antihypertensives latest blood pressure of 116/22   PAD with left great toe gangrene: Presented with ulcer at the distal left first digit, bilateral heel ulceration.  ABI shows moderate to severe peripheral vascular disease.  Vascular  surgery was consulted and underwent angiogram via left common femoral artery with balloon lithotripsy of the right SFA and popliteal artery, balloon angioplasty right  SFA and drug-eluting stent right SFA by vascular surgery on 08/10/2023.  Continue aspirin, Plavix and Crestor as per vascular surgery.  Plan to follow-up in vascular surgery clinic in 4 to 6 weeks for right lower extremity arterial duplex and ABI.  Dr Lajoyce Corners orthopedic surgery was subsequently consulted for the necrotic toe.  Dr Lajoyce Corners recommends below-knee amputation on the left side.  I have extensively discussed this with the patient as well as patient's husband  on the phone.  Patient does not wish surgical intervention.  Again explained the risk of sepsis, necrosis with the patient and the patient's husband on the phone   Cervical myelopathy: Appears to have quadriparesis on exam, unknown if related to this past history versus possibly other process like critical illness myopathy from her recent hospitalization.  Will need to continue daily physical therapy on discharge.   Anemia: Likely from chronic disease.  Recent iron indices, B12 folic acid within normal range..  Latest hemoglobin 9.2.  Disposition.  At this time, patient is stable for disposition to skilled nursing facility.  Spoke with the patient's spouse prior to discharge.  Medical Consultants:   Vascular surgery Orthopedics Dr Lajoyce Corners  Procedures:    ABI Angiogram via left common femoral artery with balloon lithotripsy of the right SFA and popliteal artery, balloon angioplasty right SFA and drug-eluting stent right SFA by vascular surgery on 08/10/2023.   Subjective:   Today, patient was seen and examined at bedside.  Poor historian.  States that she feels okay.  Denies any pain in the leg.  Had a prolonged discussion regarding necrotic toes and surgical recommendations.  Patient does not wish to go through any surgical procedures.   Discharge Exam:   Vitals:   08/12/23 0901 08/12/23 1100  BP: 121/64 125/62  Pulse: 92 100  Resp: 18 20  Temp:  98.2 F (36.8 C)  SpO2: 100% 99%   Vitals:   08/12/23 0300 08/12/23 0822 08/12/23  0901 08/12/23 1100  BP: 135/68 132/67 121/64 125/62  Pulse: 96 88 92 100  Resp: 19 12 18 20   Temp: 99.1 F (37.3 C) 98.5 F (36.9 C)  98.2 F (36.8 C)  TempSrc: Oral Oral  Oral  SpO2: 98% 100% 100% 99%  Weight:      Height:       GENERAL: Patient is alert awake and oriented to place and person.  Not in obvious distress. HENT: No scleral pallor or icterus. Pupils equally reactive to light. Oral mucosa is moist NECK: is supple, no gross swelling noted. CHEST: Clear to auscultation. No crackles or wheezes.   CVS: S1 and S2 heard, no murmur. Regular rate and rhythm.  ABDOMEN: Soft, non-tender, bowel sounds are present.  Foley catheter in place.  PEG tube in place. EXTREMITIES: No edema. CNS: Cranial nerves are intact.  Moves all extremities. SKIN: warm and dry, bilateral heel ulcers, left great toe with black discoloration.  Foul odor noted.  The results of significant diagnostics from this hospitalization (including imaging, microbiology, ancillary and laboratory) are listed below for reference.     Diagnostic Studies:   VAS [place] ABI WITH/WO TBI Result Date: 08/09/2023  LOWER EXTREMITY DOPPLER STUDY Patient Name:  AARTHI UYENO  Date of Exam:   08/09/2023 Medical Rec #: [id number]         Accession #:    [id number]  Date of Birth: [birth year]         Patient Gender: F Patient Age:   79 years Exam Location:  [facility] Procedure:      VAS [place] ABI WITH/WO TBI Referring Phys: Dolly Rias --------------------------------------------------------------------------------  Indications: Peripheral artery disease. High Risk Factors: Hypertension, hyperlipidemia, Diabetes.  Limitations: Today's exam was limited due to Left upper arm IV, an open wound              and bandages. Comparison Study: No prior studies. Performing Technologist: Olen Cordial RVT  Examination Guidelines: A complete evaluation includes at minimum, Doppler waveform signals and systolic blood pressure reading at the  level of bilateral brachial, anterior tibial, and posterior tibial arteries, when vessel segments are accessible. Bilateral testing is considered an integral part of a complete examination. Photoelectric Plethysmograph (PPG) waveforms and toe systolic pressure readings are included as required and additional duplex testing as needed. Limited examinations for reoccurring indications may be performed as noted.  ABI Findings: +--------+------------------+-----+----------+--------+ Right   Rt Pressure (mmHg)IndexWaveform  Comment  +--------+------------------+-----+----------+--------+ Brachial110                    triphasic          +--------+------------------+-----+----------+--------+ PTA     91                0.83 monophasic         +--------+------------------+-----+----------+--------+ DP      110               1.00 monophasic         +--------+------------------+-----+----------+--------+ +---------+------------------+-----+----------+----------+ Left     Lt Pressure (mmHg)IndexWaveform  Comment    +---------+------------------+-----+----------+----------+ Brachial                                  IV         +---------+------------------+-----+----------+----------+ PTA      81                0.74 monophasic           +---------+------------------+-----+----------+----------+ DP       82                0.75 monophasic           +---------+------------------+-----+----------+----------+ Great Toe                                 Open wound +---------+------------------+-----+----------+----------+ +-------+-----------+-----------+------------+------------+ ABI/TBIToday's ABIToday's TBIPrevious ABIPrevious TBI +-------+-----------+-----------+------------+------------+ Right  1.00                                           +-------+-----------+-----------+------------+------------+ Left   0.75                                            +-------+-----------+-----------+------------+------------+  Summary: Right: Resting right ankle-brachial index is within normal range. Unable to obtain TBI due to low amplitude/absent waveform. Left: Resting left ankle-brachial index indicates moderate left lower extremity arterial disease. Unable to obtain TBI due to open wound. *See table(s) above for measurements and observations.  Electronically signed by Lemar Livings MD  on 08/09/2023 at 4:38:17 PM.    Final    DG Chest Portable 1 View Result Date: 08/08/2023 CLINICAL DATA:  Fever, cough EXAM: PORTABLE CHEST 1 VIEW COMPARISON:  05/03/2023 FINDINGS: Heart and mediastinal contours are within normal limits. No focal opacities or effusions. No acute bony abnormality. IMPRESSION: No active disease. Electronically Signed   By: Charlett Nose M.D.   On: 08/08/2023 22:15     Labs:   Basic Metabolic Panel: Recent Labs  Lab 08/08/23 2225 08/09/23 0541 08/10/23 0621  NA 135 134* 135  K 3.9 3.3* 4.2  CL 96* 98 101  CO2 27 25 22   GLUCOSE 144* 102* 200*  BUN 30* 27* 22  CREATININE 0.93 0.82 0.74  CALCIUM 9.1 8.5* 8.8*  MG  --  1.6*  --   PHOS  --  3.2  --    GFR Estimated Creatinine Clearance: 64.9 mL/min (by C-G formula based on SCr of 0.74 mg/dL). Liver Function Tests: Recent Labs  Lab 08/08/23 2225 08/10/23 0621  AST 48* 93*  ALT 38 67*  ALKPHOS 88 75  BILITOT 0.5 0.6  PROT 7.2 6.2*  ALBUMIN 2.6* 2.3*   No results for input(s): "LIPASE", "AMYLASE" in the last 168 hours. No results for input(s): "AMMONIA" in the last 168 hours. Coagulation profile No results for input(s): "INR", "PROTIME" in the last 168 hours.  CBC: Recent Labs  Lab 08/08/23 2225 08/09/23 0541 08/10/23 0621  WBC 6.3 6.4 3.6*  NEUTROABS 4.8  --   --   HGB 10.2* 9.5* 9.2*  HCT 32.2* 29.9* 29.2*  MCV 83.9 84.5 82.5  PLT 318 287 227   Cardiac Enzymes: No results for input(s): "CKTOTAL", "CKMB", "CKMBINDEX", "TROPONINI" in the last 168  hours. BNP: Invalid input(s): "POCBNP" CBG: Recent Labs  Lab 08/11/23 1558 08/11/23 2141 08/12/23 0604 08/12/23 0606 08/12/23 1205  GLUCAP 231* 219* 412* 390* 453*   D-Dimer No results for input(s): "DDIMER" in the last 72 hours. Hgb A1c No results for input(s): "HGBA1C" in the last 72 hours. Lipid Profile Recent Labs    08/11/23 0254  CHOL 140  HDL 28*  LDLCALC 88  TRIG 387  CHOLHDL 5.0   Thyroid function studies No results for input(s): "TSH", "T4TOTAL", "T3FREE", "THYROIDAB" in the last 72 hours.  Invalid input(s): "FREET3" Anemia work up No results for input(s): "VITAMINB12", "FOLATE", "FERRITIN", "TIBC", "IRON", "RETICCTPCT" in the last 72 hours. Microbiology Recent Results (from the past 240 hours)  Urine Culture     Status: Abnormal   Collection Time: 08/08/23 10:00 PM   Specimen: Urine, Random  Result Value Ref Range Status   Specimen Description   Final    URINE, RANDOM Performed at [facility], [address] [place], [place] [address]    Special Requests   Final    NONE Reflexed from [id number] Performed at [facility], [address] [place], [place] [address]    Culture (A)  Final    >=100,000 COLONIES/mL ESCHERICHIA COLI 90,000 COLONIES/mL PSEUDOMONAS AERUGINOSA    Report Status 08/10/2023 FINAL  Final   Organism ID, Bacteria ESCHERICHIA COLI (A)  Final   Organism ID, Bacteria PSEUDOMONAS AERUGINOSA (A)  Final      Susceptibility   Escherichia coli - MIC*    AMPICILLIN 4 SENSITIVE Sensitive     CEFAZOLIN <=4 SENSITIVE Sensitive     CEFEPIME <=0.12 SENSITIVE Sensitive     CEFTRIAXONE <=0.25 SENSITIVE Sensitive     CIPROFLOXACIN <=0.25 SENSITIVE Sensitive  GENTAMICIN <=1 SENSITIVE Sensitive     IMIPENEM <=0.25 SENSITIVE Sensitive     NITROFURANTOIN <=16 SENSITIVE Sensitive     TRIMETH/SULFA <=20 SENSITIVE Sensitive     AMPICILLIN/SULBACTAM <=2 SENSITIVE Sensitive     PIP/TAZO <=4 SENSITIVE Sensitive  ug/mL    * >=100,000 COLONIES/mL ESCHERICHIA COLI   Pseudomonas aeruginosa - MIC*    CEFTAZIDIME 4 SENSITIVE Sensitive     CIPROFLOXACIN <=0.25 SENSITIVE Sensitive     GENTAMICIN <=1 SENSITIVE Sensitive     IMIPENEM 2 SENSITIVE Sensitive     PIP/TAZO 8 SENSITIVE Sensitive ug/mL    CEFEPIME 2 SENSITIVE Sensitive     * 90,000 COLONIES/mL PSEUDOMONAS AERUGINOSA  Resp panel by RT-PCR (RSV, Flu A&B, Covid) Anterior Nasal Swab     Status: Abnormal   Collection Time: 08/08/23 10:25 PM   Specimen: Anterior Nasal Swab  Result Value Ref Range Status   SARS Coronavirus 2 by RT PCR NEGATIVE NEGATIVE Final    Comment: (NOTE) SARS-CoV-2 target nucleic acids are NOT DETECTED.  The SARS-CoV-2 RNA is generally detectable in upper respiratory specimens during the acute phase of infection. The lowest concentration of SARS-CoV-2 viral copies this assay can detect is 138 copies/mL. A negative result does not preclude SARS-Cov-2 infection and should not be used as the sole basis for treatment or other patient management decisions. A negative result may occur with  improper specimen collection/handling, submission of specimen other than nasopharyngeal swab, presence of viral mutation(s) within the areas targeted by this assay, and inadequate number of viral copies(<138 copies/mL). A negative result must be combined with clinical observations, patient history, and epidemiological information. The expected result is Negative.  Fact Sheet for Patients:  BloggerCourse.com  Fact Sheet for Healthcare Providers:  SeriousBroker.it  This test is no t yet approved or cleared by the [place] FDA and  has been authorized for detection and/or diagnosis of SARS-CoV-2 by FDA under an Emergency Use Authorization (EUA). This EUA will remain  in effect (meaning this test can be used) for the duration of the COVID-19 declaration under Section 564(b)(1) of the Act,  21 U.S.C.section 360bbb-3(b)(1), unless the authorization is terminated  or revoked sooner.       Influenza A by PCR POSITIVE (A) NEGATIVE Final   Influenza B by PCR NEGATIVE NEGATIVE Final    Comment: (NOTE) The Xpert Xpress SARS-CoV-2/FLU/RSV plus assay is intended as an aid in the diagnosis of influenza from Nasopharyngeal swab specimens and should not be used as a sole basis for treatment. Nasal washings and aspirates are unacceptable for Xpert Xpress SARS-CoV-2/FLU/RSV testing.  Fact Sheet for Patients: BloggerCourse.com  Fact Sheet for Healthcare Providers: SeriousBroker.it  This test is not yet approved or cleared by the [place] FDA and has been authorized for detection and/or diagnosis of SARS-CoV-2 by FDA under an Emergency Use Authorization (EUA). This EUA will remain in effect (meaning this test can be used) for the duration of the COVID-19 declaration under Section 564(b)(1) of the Act, 21 U.S.C. section 360bbb-3(b)(1), unless the authorization is terminated or revoked.     Resp Syncytial Virus by PCR NEGATIVE NEGATIVE Final    Comment: (NOTE) Fact Sheet for Patients: BloggerCourse.com  Fact Sheet for Healthcare Providers: SeriousBroker.it  This test is not yet approved or cleared by the [place] FDA and has been authorized for detection and/or diagnosis of SARS-CoV-2 by FDA under an Emergency Use Authorization (EUA). This EUA will remain in effect (meaning this test can be used) for the duration  of the COVID-19 declaration under Section 564(b)(1) of the Act, 21 U.S.C. section 360bbb-3(b)(1), unless the authorization is terminated or revoked.  Performed at [facility], [address] [place], [place] [address]   Culture, blood (routine x 2)     Status: None (Preliminary result)   Collection Time: 08/08/23 10:26 PM   Specimen:  BLOOD  Result Value Ref Range Status   Specimen Description   Final    BLOOD LEFT ANTECUBITAL Performed at [facility], [address], [place], [place] [address]    Special Requests   Final    BOTTLES DRAWN AEROBIC ONLY Blood Culture results may not be optimal due to an inadequate volume of blood received in culture bottles Performed at [facility], [address] [place], [place] [address]    Culture   Final    NO GROWTH 4 DAYS Performed at [facility], [address], [place], [place] [address]    Report Status PENDING  Incomplete  Culture, blood (routine x 2)     Status: None (Preliminary result)   Collection Time: 08/08/23 10:45 PM   Specimen: BLOOD  Result Value Ref Range Status   Specimen Description   Final    BLOOD BLOOD LEFT HAND Performed at [facility], [address] [place], [place] [address]    Special Requests   Final    BOTTLES DRAWN AEROBIC AND ANAEROBIC Blood Culture results may not be optimal due to an inadequate volume of blood received in culture bottles Performed at [facility], [address] [place], [place] [address]    Culture   Final    NO GROWTH 4 DAYS Performed at [facility], [address] [place], [place] [address]    Report Status PENDING  Incomplete     Discharge Instructions:   Discharge Instructions     Call MD for:  redness, tenderness, or signs of infection (pain, swelling, redness, odor or green/yellow discharge around incision site)   Complete by: As directed    Call MD for:  severe uncontrolled pain   Complete by: As directed    Call MD for:  temperature >100.4   Complete by: As directed    Diet Carb Modified   Complete by: As directed    Discharge instructions   Complete by: As directed    Follow-up with your primary care provider at the skilled nursing facility in 3 to 5 days.  Follow-up with Vascular surgery Dr Karin Lieu in 4 to 6  weeks for arterial duplex and ABI. continue medications as prescribed, seek medical attention for worsening symptoms   Discharge wound care:   Complete by: As directed    Cleanse left heel with Vashe Cover wound bed with calcium alginate Hart [place] # [phone]) to control drainage. Top with heel foam dressing Hart [place] # [id number]) Offload at all times.  Cleanse right heel with Vashe , pat dry Apply saline moist gauze 2x2 to open wound, top with foam Change gauze daily, ok to lift foam to reapply and change foam every 3 days Offload heels at all times   Paint left great toe with betadine, allow to air dry. Perform daily   Increase activity slowly   Complete by: As directed       Allergies as of 08/12/2023       Reactions   Atorvastatin    REACTION: perceived myalgias   Ciprofloxacin Nausea And Vomiting   Epinephrine  REACTION: thyroid problems   Erythromycin    Can not recall   Morphine Nausea Only   Headache   Peanut-containing Drug Products Hives   Penicillins Hives        Medication List     TAKE these medications    albuterol (2.5 MG/3ML) 0.083% nebulizer solution Commonly known as: PROVENTIL Take 3 mLs (2.5 mg total) by nebulization every 4 (four) hours as needed for up to 5 days for wheezing or shortness of breath.   aspirin 81 MG chewable tablet Place 81 mg into feeding tube daily.   Basaglar KwikPen 100 UNIT/ML Inject 32 Units into the skin daily.   clopidogrel 75 MG tablet Commonly known as: PLAVIX Take 1 tablet (75 mg total) by mouth daily with breakfast.   famotidine 20 MG tablet Commonly known as: PEPCID Take 1 tablet (20 mg total) by mouth daily. What changed: how to take this   folic acid 1 MG tablet Commonly known as: FOLVITE Take 2 tablets (2 mg total) by mouth daily. What changed:  how much to take how to take this   free water Soln Place 400 mLs into feeding tube every 4 (four) hours.   insulin aspart 100 UNIT/ML injection Commonly known  as: novoLOG 0-15 Units, Subcutaneous, Every 4 hours, CBG < 70: Implement Hypoglycemia measures CBG 70 - 120: 0 units CBG 121 - 150: 2 units CBG 151 - 200: 3 units CBG 201 - 250: 5 units CBG 251 - 300: 8 units CBG 301 - 350: 11 units CBG 351 - 400: 15 units CBG > 400: call MD and obtain STAT lab verification What changed:  how much to take how to take this when to take this   lactose free nutrition Liqd Take 237 mLs by mouth 3 (three) times daily with meals.   feeding supplement (GLUCERNA 1.5 CAL) Liqd 65 mL/hr, Continuous, Starting at 1800 daily for 12 Hours   levothyroxine 75 MCG tablet Commonly known as: SYNTHROID Take 1 tablet (75 mcg total) by mouth daily at 6 (six) AM. What changed: how much to take   metFORMIN 500 MG tablet Commonly known as: GLUCOPHAGE Take 500 mg by mouth 2 (two) times daily with a meal.   multivitamin Tabs tablet Take 1 tablet by mouth daily. What changed: additional instructions   oseltamivir 6 MG/ML Susr suspension Commonly known as: TAMIFLU Place 12.5 mLs (75 mg total) into feeding tube 2 (two) times daily for 2 days.   polyethylene glycol 17 g packet Commonly known as: MIRALAX / GLYCOLAX Take 17 g by mouth 2 (two) times daily.   rosuvastatin 10 MG tablet Commonly known as: CRESTOR Take 1 tablet (10 mg total) by mouth daily at 6 PM.   tamsulosin 0.4 MG Caps capsule Commonly known as: FLOMAX Take 1 capsule (0.4 mg total) by mouth daily. What changed: additional instructions   thiamine 100 MG tablet Commonly known as: Vitamin B-1 Take 1 tablet (100 mg total) by mouth daily.   zinc sulfate (50mg  elemental zinc) 220 (50 Zn) MG capsule Place 220 mg into feeding tube daily.               Discharge Care Instructions  (From admission, onward)           Start     Ordered   08/12/23 0000  Discharge wound care:       Comments: Cleanse left heel with Vashe Cover wound bed with calcium alginate [place] # [phone]) to control drainage.  Top  with heel foam dressing Hart [place] # [phone]) Offload at all times.  Cleanse right heel with Vashe , pat dry Apply saline moist gauze 2x2 to open wound, top with foam Change gauze daily, ok to lift foam to reapply and change foam every 3 days Offload heels at all times   Paint left great toe with betadine, allow to air dry. Perform daily   08/12/23 1303            Follow-up Information     Victorino Sparrow, MD Follow up in 4 week(s).   Specialty: Vascular Surgery Why: follow up of vascular issues on the legs Contact information: [address] [place] [address] [phone]                  Time coordinating discharge: 39 minutes  Signed:  Anvith Mauriello  [facility] 08/12/2023, 1:03 PM

## 2023-08-12 NOTE — TOC Transition Note (Signed)
Transition of Care [facility]) - Discharge Note   Patient Details  Name: Katherine Navarro MRN: [id number] Date of Birth: Dec 01, 1955  Transition of Care [facility]) CM/SW Contact:  Eduard Roux, LCSW Phone Number: 08/12/2023, 2:38 PM   Clinical Narrative:     Patient will Discharge to: [facility]  Discharge Date: 08/12/2023 Family Notified: spouse  Transport By: Sharin Mons  Per MD patient is ready for discharge. RN, patient, and facility notified of discharge. Discharge Summary sent to facility. RN given number for [phone]. Ambulance transport requested for patient.   Clinical Social Worker signing off.  Antony Blackbird, MSW, LCSW Clinical Social Worker     Final next level of care: Skilled Nursing Facility Barriers to Discharge: Barriers Resolved   Patient Goals and CMS Choice            Discharge Placement              Patient chooses bed at: [facility] Patient to be transferred to facility by: [facility] Name of family member notified: spouse Patient and family notified of of transfer: 08/12/23  Discharge Plan and Services Additional resources added to the After Visit Summary for                                       Social Drivers of Health (SDOH) Interventions SDOH Screenings   Food Insecurity: No Food Insecurity (08/09/2023)  Housing: Low Risk  (08/09/2023)  Transportation Needs: No Transportation Needs (08/09/2023)  Utilities: Not At Risk (08/09/2023)  Depression (PHQ2-9): Low Risk  (10/31/2019)  Social Connections: Moderately Integrated (08/09/2023)  Tobacco Use: Low Risk  (08/09/2023)     Readmission Risk Interventions     No data to display

## 2023-08-12 NOTE — Progress Notes (Addendum)
PROGRESS NOTE  Katherine Navarro ZOX:[id number] DOB: [birth year] DOA: 08/08/2023 PCP: No primary care provider on file.   LOS: 3 days   Brief narrative:  Katherine Navarro is a 68 y.o. female with hx of recent admission 11/1-11/29 for severe hypothyroidism, myxedema, wernickes encephalopathy, and failure to thrive requiring PEG tube placement, urinary retention now with chronic foley;diabetes type 1, hypertension, hypothyroidism, PAD, cervical myelopathy, anemia, who was brought in from [facility] due to altered mental status and fever.  Unable to provide history on presentation.  In the ED, patient was afebrile.  Labs were notable for urinalysis with more than 50 white cells.  CBC without leukocytosis but hemoglobin of 10.2.   Lactate was elevated at 2.1.  Patient tested positive for influenza A.  TSH of 3.6.  Magnesium was 1.6 on presentation and potassium was 3.3.  Blood cultures were sent from the ED and patient was admitted hospital for further evaluation and treatment.     Assessment/Plan: Principal Problem:   Influenza A Active Problems:   UTI (urinary tract infection)   Acute encephalopathy   Encephalopathy   Gangrene of left foot (HCC)  Sepsis secondary to Influenza A  Patient met sepsis criteria on presentation with tachycardia, tachypnea leukopenia, fever with elevated lactic acid with influenza infection..  Was febrile.  Chest x-ray without any infiltrate.  Continue Tamiflu, albuterol as needed, incentive spirometry, flutter valve.  Continue Tamiflu to complete the course.  On room air.   Catheter associated UTI. History chronic Foley, exchanged in the ED with subsequent UA suspicious for UTI.  Initially received Rocephin IV, urine culture showing E. coli and Pseudomonas.  Off antibiotic.  Blood cultures negative in 4 days.  Acute infectious/metabolic encephalopathy. Improved.  At baseline patient is alert awake and oriented to time place and person. Delirium precautions,  melatonin nightly    failure to thrive status post PEG tube On tube feeding.  Seen by dietitian.  At this time on Glucerna supplements.  Malnutrition ruled out   Hypothyroidism, with recent severe hypothyroidism/myxedema:  TSH within normal limits, continue levothyroxine 150 mcg daily.   History of Wernicke's encephalopathy.  Continue thiamine daily   Urinary retention with chronic Foley: Foley exchanged in ED.  Will continue on discharge  Diabetes type 1 with hyperglycemia.  Patient is on Semglee 32 units in the morning, sliding scale at home every 4 hourly.  Will resume same regimen on discharge..  Hypertension: Not currently on antihypertensives latest blood pressure of 116/22  PAD with left great toe gangrene: With ulcer at the distal left first digit, bilateral heel ulceration.  ABI shows moderate to severe peripheral vascular disease.  Vascular surgery was consulted and underwent angiogram via left common femoral artery with balloon lithotripsy of the right SFA and popliteal artery, balloon angioplasty right SFA and drug-eluting stent right SFA by vascular surgery on 08/10/2023.  Continue aspirin Plavix and Crestor as per vascular surgery.  Plan to follow-up in vascular surgery clinic in 4 to 6 weeks for right lower extremity arterial duplex and ABI.  Dr Lajoyce Corners orthopedic surgery was subsequently consulted for the necrotic toe.  Dr Lajoyce Corners recommends below-knee amputation on the left side.  I have extensively discussed this with the patient as well as patient's has been on the phone.  Patient seems to not go to surgical intervention.  Again explained the risk of sepsis, necrosis with the patient and the patient has been at length.  Cervical myelopathy: Appears to have quadriparesis on exam,  unknown if related to this past history versus possibly other process like critical illness myopathy from her recent hospitalization.  Will need to continue daily physical therapy on discharge.  Anemia:  Likely from chronic disease.  Recent iron indices, B12 folic acid within normal range..  Latest hemoglobin 9.2.   DVT prophylaxis: heparin injection 5,000 Units Start: 08/11/23 0600   Disposition: Skilled nursing facility.  Patient is medically stable for disposition TOC aware.  Currently insurance authorization pending.  Status is: Inpatient Remains inpatient appropriate because: Status post vascular intervention, need for rehabilitation.    Code Status:     Code Status: Full Code  Family Communication: Spoke with the patient's husband on the phone 08/12/2023  Consultants: Vascular surgery Orthopedics Dr Lajoyce Corners  Procedures: ABI Angiogram via left common femoral artery with balloon lithotripsy of the right SFA and popliteal artery, balloon angioplasty right SFA and drug-eluting stent right SFA by vascular surgery on 08/10/2023.    Anti-infectives:  Tamiflu, Rocephin IV-completed.  Anti-infectives (From admission, onward)    Start     Dose/Rate Route Frequency Ordered Stop   08/09/23 2200  oseltamivir (TAMIFLU) capsule 30 mg  Status:  Discontinued       Placed in "Followed by" Linked Group   30 mg Per Tube 2 times daily 08/09/23 0517 08/09/23 1346   08/09/23 2200  cefTRIAXone (ROCEPHIN) 1 g in sodium chloride 0.9 % 100 mL IVPB        1 g 200 mL/hr over 30 Minutes Intravenous Daily at bedtime 08/09/23 0518 08/11/23 0057   08/09/23 2200  oseltamivir (TAMIFLU) 6 MG/ML suspension 75 mg        75 mg Per Tube 2 times daily 08/09/23 1346 08/14/23 0959   08/09/23 0530  oseltamivir (TAMIFLU) capsule 75 mg       Placed in "Followed by" Linked Group   75 mg Per Tube  Once 08/09/23 0517 08/09/23 0550   08/08/23 2300  cefTRIAXone (ROCEPHIN) 1 g in sodium chloride 0.9 % 100 mL IVPB        1 g 200 mL/hr over 30 Minutes Intravenous  Once 08/08/23 2252 08/08/23 2330       Subjective: Today, patient was seen and examined at bedside.  Poor historian.  States that she feels okay.  Denies  any pain in the leg.  Had a prolonged discussion regarding necrotic toes and surgical recommendations.  Patient does not wish to go through any surgical procedures.  Objective: Vitals:   08/12/23 0300 08/12/23 0822  BP: 135/68 132/67  Pulse: 96 88  Resp: 19 12  Temp: 99.1 F (37.3 C) 98.5 F (36.9 C)  SpO2: 98% 100%    Intake/Output Summary (Last 24 hours) at 08/12/2023 1136 Last data filed at 08/12/2023 0600 Gross per 24 hour  Intake 960 ml  Output 850 ml  Net 110 ml   Filed Weights   08/08/23 2118  Weight: 67.8 kg   Body mass index is 26.47 kg/m.   Physical Exam:  GENERAL: Patient is alert awake and oriented to place and person.  Not in obvious distress. HENT: No scleral pallor or icterus. Pupils equally reactive to light. Oral mucosa is moist NECK: is supple, no gross swelling noted. CHEST: Clear to auscultation. No crackles or wheezes.   CVS: S1 and S2 heard, no murmur. Regular rate and rhythm.  ABDOMEN: Soft, non-tender, bowel sounds are present.  Foley catheter in place.  PEG tube in place. EXTREMITIES: No edema. CNS: Cranial nerves are intact.  Moves all extremities. SKIN: warm and dry, bilateral heel ulcers, left great toe with black discoloration.  Foul odor noted.  Data Review: I have personally reviewed the following laboratory data and studies,  CBC: Recent Labs  Lab 08/08/23 2225 08/09/23 0541 08/10/23 0621  WBC 6.3 6.4 3.6*  NEUTROABS 4.8  --   --   HGB 10.2* 9.5* 9.2*  HCT 32.2* 29.9* 29.2*  MCV 83.9 84.5 82.5  PLT 318 287 227   Basic Metabolic Panel: Recent Labs  Lab 08/08/23 2225 08/09/23 0541 08/10/23 0621  NA 135 134* 135  K 3.9 3.3* 4.2  CL 96* 98 101  CO2 27 25 22   GLUCOSE 144* 102* 200*  BUN 30* 27* 22  CREATININE 0.93 0.82 0.74  CALCIUM 9.1 8.5* 8.8*  MG  --  1.6*  --   PHOS  --  3.2  --    Liver Function Tests: Recent Labs  Lab 08/08/23 2225 08/10/23 0621  AST 48* 93*  ALT 38 67*  ALKPHOS 88 75  BILITOT 0.5 0.6   PROT 7.2 6.2*  ALBUMIN 2.6* 2.3*   No results for input(s): "LIPASE", "AMYLASE" in the last 168 hours. No results for input(s): "AMMONIA" in the last 168 hours. Cardiac Enzymes: No results for input(s): "CKTOTAL", "CKMB", "CKMBINDEX", "TROPONINI" in the last 168 hours. BNP (last 3 results) Recent Labs    05/04/23 0234 05/05/23 1120 05/06/23 0551  BNP 971.2* 1,106.7* 665.9*    ProBNP (last 3 results) No results for input(s): "PROBNP" in the last 8760 hours.  CBG: Recent Labs  Lab 08/11/23 1121 08/11/23 1558 08/11/23 2141 08/12/23 0604 08/12/23 0606  GLUCAP 184* 231* 219* 412* 390*   Recent Results (from the past 240 hours)  Urine Culture     Status: Abnormal   Collection Time: 08/08/23 10:00 PM   Specimen: Urine, Random  Result Value Ref Range Status   Specimen Description   Final    URINE, RANDOM Performed at [facility], [address] [place], [place] [address]    Special Requests   Final    NONE Reflexed from [id number] Performed at [facility], [address], [place], [place] [address]    Culture (A)  Final    >=100,000 COLONIES/mL ESCHERICHIA COLI 90,000 COLONIES/mL PSEUDOMONAS AERUGINOSA    Report Status 08/10/2023 FINAL  Final   Organism ID, Bacteria ESCHERICHIA COLI (A)  Final   Organism ID, Bacteria PSEUDOMONAS AERUGINOSA (A)  Final      Susceptibility   Escherichia coli - MIC*    AMPICILLIN 4 SENSITIVE Sensitive     CEFAZOLIN <=4 SENSITIVE Sensitive     CEFEPIME <=0.12 SENSITIVE Sensitive     CEFTRIAXONE <=0.25 SENSITIVE Sensitive     CIPROFLOXACIN <=0.25 SENSITIVE Sensitive     GENTAMICIN <=1 SENSITIVE Sensitive     IMIPENEM <=0.25 SENSITIVE Sensitive     NITROFURANTOIN <=16 SENSITIVE Sensitive     TRIMETH/SULFA <=20 SENSITIVE Sensitive     AMPICILLIN/SULBACTAM <=2 SENSITIVE Sensitive     PIP/TAZO <=4 SENSITIVE Sensitive ug/mL    * >=100,000 COLONIES/mL ESCHERICHIA COLI   Pseudomonas aeruginosa - MIC*     CEFTAZIDIME 4 SENSITIVE Sensitive     CIPROFLOXACIN <=0.25 SENSITIVE Sensitive     GENTAMICIN <=1 SENSITIVE Sensitive     IMIPENEM 2 SENSITIVE Sensitive     PIP/TAZO 8 SENSITIVE Sensitive ug/mL    CEFEPIME 2 SENSITIVE Sensitive     * 90,000 COLONIES/mL PSEUDOMONAS AERUGINOSA  Resp panel by RT-PCR (  RSV, Flu A&B, Covid) Anterior Nasal Swab     Status: Abnormal   Collection Time: 08/08/23 10:25 PM   Specimen: Anterior Nasal Swab  Result Value Ref Range Status   SARS Coronavirus 2 by RT PCR NEGATIVE NEGATIVE Final    Comment: (NOTE) SARS-CoV-2 target nucleic acids are NOT DETECTED.  The SARS-CoV-2 RNA is generally detectable in upper respiratory specimens during the acute phase of infection. The lowest concentration of SARS-CoV-2 viral copies this assay can detect is 138 copies/mL. A negative result does not preclude SARS-Cov-2 infection and should not be used as the sole basis for treatment or other patient management decisions. A negative result may occur with  improper specimen collection/handling, submission of specimen other than nasopharyngeal swab, presence of viral mutation(s) within the areas targeted by this assay, and inadequate number of viral copies(<138 copies/mL). A negative result must be combined with clinical observations, patient history, and epidemiological information. The expected result is Negative.  Fact Sheet for Patients:  BloggerCourse.com  Fact Sheet for Healthcare Providers:  SeriousBroker.it  This test is no t yet approved or cleared by the [place] FDA and  has been authorized for detection and/or diagnosis of SARS-CoV-2 by FDA under an Emergency Use Authorization (EUA). This EUA will remain  in effect (meaning this test can be used) for the duration of the COVID-19 declaration under Section 564(b)(1) of the Act, 21 U.S.C.section 360bbb-3(b)(1), unless the authorization is terminated  or revoked  sooner.       Influenza A by PCR POSITIVE (A) NEGATIVE Final   Influenza B by PCR NEGATIVE NEGATIVE Final    Comment: (NOTE) The Xpert Xpress SARS-CoV-2/FLU/RSV plus assay is intended as an aid in the diagnosis of influenza from Nasopharyngeal swab specimens and should not be used as a sole basis for treatment. Nasal washings and aspirates are unacceptable for Xpert Xpress SARS-CoV-2/FLU/RSV testing.  Fact Sheet for Patients: BloggerCourse.com  Fact Sheet for Healthcare Providers: SeriousBroker.it  This test is not yet approved or cleared by the [place] FDA and has been authorized for detection and/or diagnosis of SARS-CoV-2 by FDA under an Emergency Use Authorization (EUA). This EUA will remain in effect (meaning this test can be used) for the duration of the COVID-19 declaration under Section 564(b)(1) of the Act, 21 U.S.C. section 360bbb-3(b)(1), unless the authorization is terminated or revoked.     Resp Syncytial Virus by PCR NEGATIVE NEGATIVE Final    Comment: (NOTE) Fact Sheet for Patients: BloggerCourse.com  Fact Sheet for Healthcare Providers: SeriousBroker.it  This test is not yet approved or cleared by the [place] FDA and has been authorized for detection and/or diagnosis of SARS-CoV-2 by FDA under an Emergency Use Authorization (EUA). This EUA will remain in effect (meaning this test can be used) for the duration of the COVID-19 declaration under Section 564(b)(1) of the Act, 21 U.S.C. section 360bbb-3(b)(1), unless the authorization is terminated or revoked.  Performed at [facility], [address], [place], [place] [address]   Culture, blood (routine x 2)     Status: None (Preliminary result)   Collection Time: 08/08/23 10:26 PM   Specimen: BLOOD  Result Value Ref Range Status   Specimen Description   Final    BLOOD LEFT  ANTECUBITAL Performed at [facility], [address] [place], [place] [address]    Special Requests   Final    BOTTLES DRAWN AEROBIC ONLY Blood Culture results may not be optimal due to an inadequate volume  of blood received in culture bottles Performed at [facility], [address], [place], [place] [address]    Culture   Final    NO GROWTH 4 DAYS Performed at [facility], [address], [place], [place] [address]    Report Status PENDING  Incomplete  Culture, blood (routine x 2)     Status: None (Preliminary result)   Collection Time: 08/08/23 10:45 PM   Specimen: BLOOD  Result Value Ref Range Status   Specimen Description   Final    BLOOD BLOOD LEFT HAND Performed at [facility], [address], [place], [place] [address]    Special Requests   Final    BOTTLES DRAWN AEROBIC AND ANAEROBIC Blood Culture results may not be optimal due to an inadequate volume of blood received in culture bottles Performed at [facility], [address], [place], [place] [address]    Culture   Final    NO GROWTH 4 DAYS Performed at [facility], [address], [place], [place] [address]    Report Status PENDING  Incomplete     Studies: PERIPHERAL VASCULAR CATHETERIZATION Result Date: 08/10/2023 Images from the original result were not included. Patient name: CELE MOTE MRN: [id number] DOB: [birth year] Sex: female 08/10/2023 Pre-operative Diagnosis: Bilateral lower extremity critical and ischemia with tissue loss Post-operative diagnosis:  Same Surgeon:  Victorino Sparrow, MD Procedure Performed: 1.  Ultrasound-guided micropuncture access of the left common femoral artery 2.  Aortogram 3.  Secondary cannulation, right lower extremity angiogram 4.  Third order cannulation, right lower extremity angiogram 5.  Balloon lithotripsy superficial femoral artery, popliteal artery, 3 x 80 mm shockwave 6.  Balloon  angioplasty the superficial femoral artery 4 x 80 mm 7.  Drug-eluting stent 5 x 60 mm Cook Zilver to the superficial femoral artery 8.  Left lower extremity angiogram Moderate sedation time 86 minutes, contrast volume 155 mL Device assisted closure-Mynx Indications: Patient is a 68 year old female who is nonambulatory, but able to stand and pivot who presents with bilateral lower extremity wounds, nonpalpable pulses.  ABIs demonstrated monophasic waveforms with no toe pressure appreciated.  The left heel wound does not appear salvageable, the right appears more superficial.  After discussing risks and benefits of bilateral lower extremity angiogram with emphasis on the right in an effort to define, and improve perfusion for wound healing, the patient elected to proceed. Findings: Aortogram: Widely patent renal arteries bilaterally, no flow-limiting stenosis in aortoiliac segments bilaterally. On the right: Small vasculature.  Widely patent right common femoral artery, profunda.  The superficial femoral artery had moderate disease with multiple areas of tandem, flow-limiting stenosis greater than 70%.  This continued into the popliteal artery at the P1 and P2 segments.  Two-vessel outflow, anterior tibial artery, peroneal artery.  The posterior tibial artery is occluded at its ostia.  Filling of the foot is through peroneal collaterals and the dorsalis pedis, which is small and atretic.  Severe small vessel disease in the foot.  No inline flow to the heel. On the left: Small vasculature.  Widely patent common femoral artery, profunda and the superficial femoral artery demonstrates less disease than the right leg.  Patent popliteal artery.  Atretic anterior tibial artery, occluding at the distal tibia.  Severe disease in the tibioperoneal trunk with tandem lesions demonstrating greater than 90% stenosis.  Single-vessel peroneal runoff to the foot.  Procedure:  The patient was identified in the holding area and  taken  to room 8.  The patient was then placed supine on the table and prepped and draped in the usual sterile fashion.  A time out was called.  Ultrasound was used to evaluate the left common femoral artery.  It was patent .  A digital ultrasound image was acquired.  A micropuncture needle was used to access the left common femoral artery under ultrasound guidance.  An 018 wire was advanced without resistance and a micropuncture sheath was placed.  The 018 wire was removed and a benson wire was placed.  The micropuncture sheath was exchanged for a 5 french sheath.  An omniflush catheter was advanced over the wire to the level of L-1.  An abdominal angiogram was obtained.  Next, using the omniflush catheter and a benson wire, the aortic bifurcation was crossed and the catheter was placed into theright external iliac artery and right runoff was obtained.  left runoff was performed via retrograde sheath injections. I elected to attempt intervention on the multiple areas of greater than 70% stenosis appreciated in the superficial femoral artery and popliteal artery.  The patient was heparinized and a 6 x 60 cm sheath was brought onto the field and parked in the mid superficial femoral artery.  Angiography followed.  There appeared to be an intimal flap which occurred due to the small size of the artery and the sheath.  The sheath was pulled back.  Due to the small size of the arteries, I elected to start with a small balloon, and I was worried about dissection, therefore I elected to use balloon lithotripsy with an inflation of 2 atm.  A 3 x 80 mm balloon was brought onto the field and expanded along the superficial femoral artery with 40 pulses, followed by popliteal artery with 40 pulses.  This demonstrated significant improvement with resolution of flow-limiting stenosis.  There was still the intimal lesion that appeared to be a dissection in the mid SFA which occurred due to the sheath and the small artery size.  I placed  a 4 x 60 mm balloon over the lesion and inflated it for 3 minutes x 2.  The lesion was still present.  It appeared                  flow-limiting. I elected to place a 5 x 60 mm Cook Zilver drug-eluting stent.  This was postdilated with a 4 x 60 mm balloon with an excellent result.  Completion angiogram followed demonstrating no change in outflow, with increase flow through the superficial femoral artery. At this point, the sheath was pulled back and left lower extremity angiogram followed.  See results above. The patient was closed using a minx device without issue. I am very concerned, the patient will not have enough blood flow for healing of bilateral lower extremity heel wounds.  These are pressure induced due to being nonambulatory.  She has no inline flow to the the heels bilaterally.  She is optimized from a vascular surgery perspective in the right leg.  The left leg has not been revascularized, but in its current state my partner Dr. Randie Heinz, who evaluated the wound, felt that it was nonsalvageable.  No plan for further revascularization at this time. Patient will be managed on aspirin, Plavix, high intensity statin. Victorino Sparrow MD [facility] Office: [phone]      Joycelyn Das, MD  [facility] 08/12/2023  If 7PM-7AM, please contact night-coverage

## 2023-08-12 NOTE — Progress Notes (Signed)
This nurse called [facility] x2 with no response. Report given to [facility] paramedic for safe handoff

## 2023-08-13 LAB — CULTURE, BLOOD (ROUTINE X 2)
Culture: NO GROWTH
Culture: NO GROWTH

## 2023-08-15 DIAGNOSIS — I739 Peripheral vascular disease, unspecified: Secondary | ICD-10-CM | POA: Diagnosis not present

## 2023-08-15 DIAGNOSIS — R1110 Vomiting, unspecified: Secondary | ICD-10-CM | POA: Diagnosis not present

## 2023-08-15 DIAGNOSIS — R627 Adult failure to thrive: Secondary | ICD-10-CM | POA: Diagnosis not present

## 2023-08-15 DIAGNOSIS — Z741 Need for assistance with personal care: Secondary | ICD-10-CM | POA: Diagnosis not present

## 2023-08-15 DIAGNOSIS — R279 Unspecified lack of coordination: Secondary | ICD-10-CM | POA: Diagnosis not present

## 2023-08-15 DIAGNOSIS — E1065 Type 1 diabetes mellitus with hyperglycemia: Secondary | ICD-10-CM | POA: Diagnosis not present

## 2023-08-15 DIAGNOSIS — Z993 Dependence on wheelchair: Secondary | ICD-10-CM | POA: Diagnosis not present

## 2023-08-16 DIAGNOSIS — Z993 Dependence on wheelchair: Secondary | ICD-10-CM | POA: Diagnosis not present

## 2023-08-16 DIAGNOSIS — R279 Unspecified lack of coordination: Secondary | ICD-10-CM | POA: Diagnosis not present

## 2023-08-16 DIAGNOSIS — Z741 Need for assistance with personal care: Secondary | ICD-10-CM | POA: Diagnosis not present

## 2023-08-16 DIAGNOSIS — R627 Adult failure to thrive: Secondary | ICD-10-CM | POA: Diagnosis not present

## 2023-08-17 DIAGNOSIS — G9341 Metabolic encephalopathy: Secondary | ICD-10-CM | POA: Diagnosis not present

## 2023-08-17 DIAGNOSIS — R630 Anorexia: Secondary | ICD-10-CM | POA: Diagnosis not present

## 2023-08-17 DIAGNOSIS — J9601 Acute respiratory failure with hypoxia: Secondary | ICD-10-CM | POA: Diagnosis not present

## 2023-08-17 DIAGNOSIS — N390 Urinary tract infection, site not specified: Secondary | ICD-10-CM | POA: Diagnosis not present

## 2023-08-17 DIAGNOSIS — R627 Adult failure to thrive: Secondary | ICD-10-CM | POA: Diagnosis not present

## 2023-08-17 DIAGNOSIS — Z741 Need for assistance with personal care: Secondary | ICD-10-CM | POA: Diagnosis not present

## 2023-08-17 DIAGNOSIS — R279 Unspecified lack of coordination: Secondary | ICD-10-CM | POA: Diagnosis not present

## 2023-08-17 DIAGNOSIS — Z993 Dependence on wheelchair: Secondary | ICD-10-CM | POA: Diagnosis not present

## 2023-08-18 DIAGNOSIS — R279 Unspecified lack of coordination: Secondary | ICD-10-CM | POA: Diagnosis not present

## 2023-08-18 DIAGNOSIS — Z741 Need for assistance with personal care: Secondary | ICD-10-CM | POA: Diagnosis not present

## 2023-08-18 DIAGNOSIS — Z993 Dependence on wheelchair: Secondary | ICD-10-CM | POA: Diagnosis not present

## 2023-08-18 DIAGNOSIS — R627 Adult failure to thrive: Secondary | ICD-10-CM | POA: Diagnosis not present

## 2023-08-19 DIAGNOSIS — Z993 Dependence on wheelchair: Secondary | ICD-10-CM | POA: Diagnosis not present

## 2023-08-19 DIAGNOSIS — R627 Adult failure to thrive: Secondary | ICD-10-CM | POA: Diagnosis not present

## 2023-08-19 DIAGNOSIS — Z741 Need for assistance with personal care: Secondary | ICD-10-CM | POA: Diagnosis not present

## 2023-08-19 DIAGNOSIS — R279 Unspecified lack of coordination: Secondary | ICD-10-CM | POA: Diagnosis not present

## 2023-08-22 DIAGNOSIS — J411 Mucopurulent chronic bronchitis: Secondary | ICD-10-CM | POA: Diagnosis not present

## 2023-08-22 DIAGNOSIS — I509 Heart failure, unspecified: Secondary | ICD-10-CM | POA: Diagnosis not present

## 2023-08-22 DIAGNOSIS — R059 Cough, unspecified: Secondary | ICD-10-CM | POA: Diagnosis not present

## 2023-08-22 DIAGNOSIS — R1110 Vomiting, unspecified: Secondary | ICD-10-CM | POA: Diagnosis not present

## 2023-08-22 DIAGNOSIS — R0989 Other specified symptoms and signs involving the circulatory and respiratory systems: Secondary | ICD-10-CM | POA: Diagnosis not present

## 2023-08-22 DIAGNOSIS — E1065 Type 1 diabetes mellitus with hyperglycemia: Secondary | ICD-10-CM | POA: Diagnosis not present

## 2023-08-22 DIAGNOSIS — R109 Unspecified abdominal pain: Secondary | ICD-10-CM | POA: Diagnosis not present

## 2023-08-23 DIAGNOSIS — Z741 Need for assistance with personal care: Secondary | ICD-10-CM | POA: Diagnosis not present

## 2023-08-23 DIAGNOSIS — J9601 Acute respiratory failure with hypoxia: Secondary | ICD-10-CM | POA: Diagnosis not present

## 2023-08-23 DIAGNOSIS — G9341 Metabolic encephalopathy: Secondary | ICD-10-CM | POA: Diagnosis not present

## 2023-08-23 DIAGNOSIS — Z993 Dependence on wheelchair: Secondary | ICD-10-CM | POA: Diagnosis not present

## 2023-08-23 DIAGNOSIS — R627 Adult failure to thrive: Secondary | ICD-10-CM | POA: Diagnosis not present

## 2023-08-23 DIAGNOSIS — R279 Unspecified lack of coordination: Secondary | ICD-10-CM | POA: Diagnosis not present

## 2023-08-23 DIAGNOSIS — R1312 Dysphagia, oropharyngeal phase: Secondary | ICD-10-CM | POA: Diagnosis not present

## 2023-08-24 DIAGNOSIS — Z993 Dependence on wheelchair: Secondary | ICD-10-CM | POA: Diagnosis not present

## 2023-08-24 DIAGNOSIS — I10 Essential (primary) hypertension: Secondary | ICD-10-CM | POA: Diagnosis not present

## 2023-08-24 DIAGNOSIS — Z741 Need for assistance with personal care: Secondary | ICD-10-CM | POA: Diagnosis not present

## 2023-08-24 DIAGNOSIS — J9601 Acute respiratory failure with hypoxia: Secondary | ICD-10-CM | POA: Diagnosis not present

## 2023-08-24 DIAGNOSIS — R627 Adult failure to thrive: Secondary | ICD-10-CM | POA: Diagnosis not present

## 2023-08-24 DIAGNOSIS — R1312 Dysphagia, oropharyngeal phase: Secondary | ICD-10-CM | POA: Diagnosis not present

## 2023-08-24 DIAGNOSIS — R279 Unspecified lack of coordination: Secondary | ICD-10-CM | POA: Diagnosis not present

## 2023-08-24 DIAGNOSIS — G9341 Metabolic encephalopathy: Secondary | ICD-10-CM | POA: Diagnosis not present

## 2023-08-24 DIAGNOSIS — Z79899 Other long term (current) drug therapy: Secondary | ICD-10-CM | POA: Diagnosis not present

## 2023-08-25 DIAGNOSIS — Z741 Need for assistance with personal care: Secondary | ICD-10-CM | POA: Diagnosis not present

## 2023-08-25 DIAGNOSIS — R1312 Dysphagia, oropharyngeal phase: Secondary | ICD-10-CM | POA: Diagnosis not present

## 2023-08-25 DIAGNOSIS — J9601 Acute respiratory failure with hypoxia: Secondary | ICD-10-CM | POA: Diagnosis not present

## 2023-08-25 DIAGNOSIS — G9341 Metabolic encephalopathy: Secondary | ICD-10-CM | POA: Diagnosis not present

## 2023-08-25 DIAGNOSIS — R279 Unspecified lack of coordination: Secondary | ICD-10-CM | POA: Diagnosis not present

## 2023-08-25 DIAGNOSIS — Z993 Dependence on wheelchair: Secondary | ICD-10-CM | POA: Diagnosis not present

## 2023-08-25 DIAGNOSIS — R627 Adult failure to thrive: Secondary | ICD-10-CM | POA: Diagnosis not present

## 2023-08-26 DIAGNOSIS — R627 Adult failure to thrive: Secondary | ICD-10-CM | POA: Diagnosis not present

## 2023-08-26 DIAGNOSIS — Z993 Dependence on wheelchair: Secondary | ICD-10-CM | POA: Diagnosis not present

## 2023-08-26 DIAGNOSIS — J9601 Acute respiratory failure with hypoxia: Secondary | ICD-10-CM | POA: Diagnosis not present

## 2023-08-26 DIAGNOSIS — R279 Unspecified lack of coordination: Secondary | ICD-10-CM | POA: Diagnosis not present

## 2023-08-26 DIAGNOSIS — Z741 Need for assistance with personal care: Secondary | ICD-10-CM | POA: Diagnosis not present

## 2023-08-26 DIAGNOSIS — R1312 Dysphagia, oropharyngeal phase: Secondary | ICD-10-CM | POA: Diagnosis not present

## 2023-08-26 DIAGNOSIS — G9341 Metabolic encephalopathy: Secondary | ICD-10-CM | POA: Diagnosis not present

## 2023-08-26 DIAGNOSIS — J411 Mucopurulent chronic bronchitis: Secondary | ICD-10-CM | POA: Diagnosis not present

## 2023-08-26 DIAGNOSIS — E1065 Type 1 diabetes mellitus with hyperglycemia: Secondary | ICD-10-CM | POA: Diagnosis not present

## 2023-08-29 DIAGNOSIS — I739 Peripheral vascular disease, unspecified: Secondary | ICD-10-CM | POA: Diagnosis not present

## 2023-08-29 DIAGNOSIS — E1065 Type 1 diabetes mellitus with hyperglycemia: Secondary | ICD-10-CM | POA: Diagnosis not present

## 2023-08-29 DIAGNOSIS — Z993 Dependence on wheelchair: Secondary | ICD-10-CM | POA: Diagnosis not present

## 2023-08-29 DIAGNOSIS — R1312 Dysphagia, oropharyngeal phase: Secondary | ICD-10-CM | POA: Diagnosis not present

## 2023-08-29 DIAGNOSIS — R791 Abnormal coagulation profile: Secondary | ICD-10-CM | POA: Diagnosis not present

## 2023-08-29 DIAGNOSIS — R279 Unspecified lack of coordination: Secondary | ICD-10-CM | POA: Diagnosis not present

## 2023-08-29 DIAGNOSIS — R627 Adult failure to thrive: Secondary | ICD-10-CM | POA: Diagnosis not present

## 2023-08-29 DIAGNOSIS — G9341 Metabolic encephalopathy: Secondary | ICD-10-CM | POA: Diagnosis not present

## 2023-08-29 DIAGNOSIS — Z741 Need for assistance with personal care: Secondary | ICD-10-CM | POA: Diagnosis not present

## 2023-08-29 DIAGNOSIS — J9601 Acute respiratory failure with hypoxia: Secondary | ICD-10-CM | POA: Diagnosis not present

## 2023-08-29 DIAGNOSIS — I96 Gangrene, not elsewhere classified: Secondary | ICD-10-CM | POA: Diagnosis not present

## 2023-08-30 DIAGNOSIS — Z741 Need for assistance with personal care: Secondary | ICD-10-CM | POA: Diagnosis not present

## 2023-08-30 DIAGNOSIS — J9601 Acute respiratory failure with hypoxia: Secondary | ICD-10-CM | POA: Diagnosis not present

## 2023-08-30 DIAGNOSIS — G9341 Metabolic encephalopathy: Secondary | ICD-10-CM | POA: Diagnosis not present

## 2023-08-30 DIAGNOSIS — R627 Adult failure to thrive: Secondary | ICD-10-CM | POA: Diagnosis not present

## 2023-08-30 DIAGNOSIS — Z993 Dependence on wheelchair: Secondary | ICD-10-CM | POA: Diagnosis not present

## 2023-08-30 DIAGNOSIS — R279 Unspecified lack of coordination: Secondary | ICD-10-CM | POA: Diagnosis not present

## 2023-08-30 DIAGNOSIS — R1312 Dysphagia, oropharyngeal phase: Secondary | ICD-10-CM | POA: Diagnosis not present

## 2023-08-31 DIAGNOSIS — Z993 Dependence on wheelchair: Secondary | ICD-10-CM | POA: Diagnosis not present

## 2023-08-31 DIAGNOSIS — R1312 Dysphagia, oropharyngeal phase: Secondary | ICD-10-CM | POA: Diagnosis not present

## 2023-08-31 DIAGNOSIS — Z741 Need for assistance with personal care: Secondary | ICD-10-CM | POA: Diagnosis not present

## 2023-08-31 DIAGNOSIS — G9341 Metabolic encephalopathy: Secondary | ICD-10-CM | POA: Diagnosis not present

## 2023-08-31 DIAGNOSIS — J9601 Acute respiratory failure with hypoxia: Secondary | ICD-10-CM | POA: Diagnosis not present

## 2023-08-31 DIAGNOSIS — R279 Unspecified lack of coordination: Secondary | ICD-10-CM | POA: Diagnosis not present

## 2023-08-31 DIAGNOSIS — R627 Adult failure to thrive: Secondary | ICD-10-CM | POA: Diagnosis not present

## 2023-09-01 DIAGNOSIS — R279 Unspecified lack of coordination: Secondary | ICD-10-CM | POA: Diagnosis not present

## 2023-09-01 DIAGNOSIS — J9601 Acute respiratory failure with hypoxia: Secondary | ICD-10-CM | POA: Diagnosis not present

## 2023-09-01 DIAGNOSIS — G9341 Metabolic encephalopathy: Secondary | ICD-10-CM | POA: Diagnosis not present

## 2023-09-01 DIAGNOSIS — Z741 Need for assistance with personal care: Secondary | ICD-10-CM | POA: Diagnosis not present

## 2023-09-01 DIAGNOSIS — Z993 Dependence on wheelchair: Secondary | ICD-10-CM | POA: Diagnosis not present

## 2023-09-01 DIAGNOSIS — R1312 Dysphagia, oropharyngeal phase: Secondary | ICD-10-CM | POA: Diagnosis not present

## 2023-09-01 DIAGNOSIS — R627 Adult failure to thrive: Secondary | ICD-10-CM | POA: Diagnosis not present

## 2023-09-02 DIAGNOSIS — Z741 Need for assistance with personal care: Secondary | ICD-10-CM | POA: Diagnosis not present

## 2023-09-02 DIAGNOSIS — R1312 Dysphagia, oropharyngeal phase: Secondary | ICD-10-CM | POA: Diagnosis not present

## 2023-09-02 DIAGNOSIS — G9341 Metabolic encephalopathy: Secondary | ICD-10-CM | POA: Diagnosis not present

## 2023-09-02 DIAGNOSIS — R627 Adult failure to thrive: Secondary | ICD-10-CM | POA: Diagnosis not present

## 2023-09-02 DIAGNOSIS — Z993 Dependence on wheelchair: Secondary | ICD-10-CM | POA: Diagnosis not present

## 2023-09-02 DIAGNOSIS — R279 Unspecified lack of coordination: Secondary | ICD-10-CM | POA: Diagnosis not present

## 2023-09-02 DIAGNOSIS — J9601 Acute respiratory failure with hypoxia: Secondary | ICD-10-CM | POA: Diagnosis not present

## 2023-09-05 DIAGNOSIS — E1065 Type 1 diabetes mellitus with hyperglycemia: Secondary | ICD-10-CM | POA: Diagnosis not present

## 2023-09-05 DIAGNOSIS — R627 Adult failure to thrive: Secondary | ICD-10-CM | POA: Diagnosis not present

## 2023-09-05 DIAGNOSIS — G9341 Metabolic encephalopathy: Secondary | ICD-10-CM | POA: Diagnosis not present

## 2023-09-05 DIAGNOSIS — R1312 Dysphagia, oropharyngeal phase: Secondary | ICD-10-CM | POA: Diagnosis not present

## 2023-09-05 DIAGNOSIS — Z993 Dependence on wheelchair: Secondary | ICD-10-CM | POA: Diagnosis not present

## 2023-09-05 DIAGNOSIS — J9601 Acute respiratory failure with hypoxia: Secondary | ICD-10-CM | POA: Diagnosis not present

## 2023-09-05 DIAGNOSIS — R279 Unspecified lack of coordination: Secondary | ICD-10-CM | POA: Diagnosis not present

## 2023-09-05 DIAGNOSIS — Z741 Need for assistance with personal care: Secondary | ICD-10-CM | POA: Diagnosis not present

## 2023-09-06 DIAGNOSIS — R109 Unspecified abdominal pain: Secondary | ICD-10-CM | POA: Diagnosis not present

## 2023-09-06 DIAGNOSIS — G9341 Metabolic encephalopathy: Secondary | ICD-10-CM | POA: Diagnosis not present

## 2023-09-06 DIAGNOSIS — J9601 Acute respiratory failure with hypoxia: Secondary | ICD-10-CM | POA: Diagnosis not present

## 2023-09-06 DIAGNOSIS — Z741 Need for assistance with personal care: Secondary | ICD-10-CM | POA: Diagnosis not present

## 2023-09-06 DIAGNOSIS — Z993 Dependence on wheelchair: Secondary | ICD-10-CM | POA: Diagnosis not present

## 2023-09-06 DIAGNOSIS — R1312 Dysphagia, oropharyngeal phase: Secondary | ICD-10-CM | POA: Diagnosis not present

## 2023-09-06 DIAGNOSIS — R627 Adult failure to thrive: Secondary | ICD-10-CM | POA: Diagnosis not present

## 2023-09-06 DIAGNOSIS — R279 Unspecified lack of coordination: Secondary | ICD-10-CM | POA: Diagnosis not present

## 2023-09-07 DIAGNOSIS — R1312 Dysphagia, oropharyngeal phase: Secondary | ICD-10-CM | POA: Diagnosis not present

## 2023-09-07 DIAGNOSIS — J9601 Acute respiratory failure with hypoxia: Secondary | ICD-10-CM | POA: Diagnosis not present

## 2023-09-07 DIAGNOSIS — Z993 Dependence on wheelchair: Secondary | ICD-10-CM | POA: Diagnosis not present

## 2023-09-07 DIAGNOSIS — Z741 Need for assistance with personal care: Secondary | ICD-10-CM | POA: Diagnosis not present

## 2023-09-07 DIAGNOSIS — G9341 Metabolic encephalopathy: Secondary | ICD-10-CM | POA: Diagnosis not present

## 2023-09-07 DIAGNOSIS — R279 Unspecified lack of coordination: Secondary | ICD-10-CM | POA: Diagnosis not present

## 2023-09-07 DIAGNOSIS — R627 Adult failure to thrive: Secondary | ICD-10-CM | POA: Diagnosis not present

## 2023-09-08 DIAGNOSIS — R279 Unspecified lack of coordination: Secondary | ICD-10-CM | POA: Diagnosis not present

## 2023-09-08 DIAGNOSIS — R1312 Dysphagia, oropharyngeal phase: Secondary | ICD-10-CM | POA: Diagnosis not present

## 2023-09-08 DIAGNOSIS — Z993 Dependence on wheelchair: Secondary | ICD-10-CM | POA: Diagnosis not present

## 2023-09-08 DIAGNOSIS — G9341 Metabolic encephalopathy: Secondary | ICD-10-CM | POA: Diagnosis not present

## 2023-09-08 DIAGNOSIS — R627 Adult failure to thrive: Secondary | ICD-10-CM | POA: Diagnosis not present

## 2023-09-08 DIAGNOSIS — J9601 Acute respiratory failure with hypoxia: Secondary | ICD-10-CM | POA: Diagnosis not present

## 2023-09-08 DIAGNOSIS — R140 Abdominal distension (gaseous): Secondary | ICD-10-CM | POA: Diagnosis not present

## 2023-09-08 DIAGNOSIS — E1065 Type 1 diabetes mellitus with hyperglycemia: Secondary | ICD-10-CM | POA: Diagnosis not present

## 2023-09-08 DIAGNOSIS — Z741 Need for assistance with personal care: Secondary | ICD-10-CM | POA: Diagnosis not present

## 2023-09-09 DIAGNOSIS — R279 Unspecified lack of coordination: Secondary | ICD-10-CM | POA: Diagnosis not present

## 2023-09-09 DIAGNOSIS — G9341 Metabolic encephalopathy: Secondary | ICD-10-CM | POA: Diagnosis not present

## 2023-09-09 DIAGNOSIS — Z741 Need for assistance with personal care: Secondary | ICD-10-CM | POA: Diagnosis not present

## 2023-09-09 DIAGNOSIS — J9601 Acute respiratory failure with hypoxia: Secondary | ICD-10-CM | POA: Diagnosis not present

## 2023-09-09 DIAGNOSIS — Z993 Dependence on wheelchair: Secondary | ICD-10-CM | POA: Diagnosis not present

## 2023-09-09 DIAGNOSIS — R1312 Dysphagia, oropharyngeal phase: Secondary | ICD-10-CM | POA: Diagnosis not present

## 2023-09-09 DIAGNOSIS — R627 Adult failure to thrive: Secondary | ICD-10-CM | POA: Diagnosis not present

## 2023-09-13 DIAGNOSIS — I96 Gangrene, not elsewhere classified: Secondary | ICD-10-CM | POA: Diagnosis not present

## 2023-09-13 DIAGNOSIS — E039 Hypothyroidism, unspecified: Secondary | ICD-10-CM | POA: Diagnosis not present

## 2023-09-13 DIAGNOSIS — E1065 Type 1 diabetes mellitus with hyperglycemia: Secondary | ICD-10-CM | POA: Diagnosis not present

## 2023-09-13 DIAGNOSIS — I739 Peripheral vascular disease, unspecified: Secondary | ICD-10-CM | POA: Diagnosis not present

## 2023-09-16 ENCOUNTER — Other Ambulatory Visit: Payer: Self-pay

## 2023-09-16 DIAGNOSIS — I739 Peripheral vascular disease, unspecified: Secondary | ICD-10-CM

## 2023-09-20 DIAGNOSIS — N318 Other neuromuscular dysfunction of bladder: Secondary | ICD-10-CM | POA: Diagnosis not present

## 2023-09-20 DIAGNOSIS — G9341 Metabolic encephalopathy: Secondary | ICD-10-CM | POA: Diagnosis not present

## 2023-09-20 DIAGNOSIS — R339 Retention of urine, unspecified: Secondary | ICD-10-CM | POA: Diagnosis not present

## 2023-09-20 DIAGNOSIS — R1312 Dysphagia, oropharyngeal phase: Secondary | ICD-10-CM | POA: Diagnosis not present

## 2023-09-20 DIAGNOSIS — E1065 Type 1 diabetes mellitus with hyperglycemia: Secondary | ICD-10-CM | POA: Diagnosis not present

## 2023-09-20 DIAGNOSIS — R627 Adult failure to thrive: Secondary | ICD-10-CM | POA: Diagnosis not present

## 2023-09-20 DIAGNOSIS — E108 Type 1 diabetes mellitus with unspecified complications: Secondary | ICD-10-CM | POA: Diagnosis not present

## 2023-09-23 ENCOUNTER — Emergency Department (HOSPITAL_COMMUNITY)
Admission: EM | Admit: 2023-09-23 | Discharge: 2023-09-24 | Disposition: A | Discharge status: Home / Self Care | Attending: Emergency Medicine | Admitting: Emergency Medicine

## 2023-09-23 ENCOUNTER — Emergency Department (HOSPITAL_COMMUNITY)

## 2023-09-23 DIAGNOSIS — R4182 Altered mental status, unspecified: Secondary | ICD-10-CM | POA: Insufficient documentation

## 2023-09-23 DIAGNOSIS — R4020 Unspecified coma: Secondary | ICD-10-CM | POA: Diagnosis not present

## 2023-09-23 DIAGNOSIS — R531 Weakness: Secondary | ICD-10-CM | POA: Diagnosis not present

## 2023-09-23 DIAGNOSIS — E11649 Type 2 diabetes mellitus with hypoglycemia without coma: Secondary | ICD-10-CM | POA: Diagnosis not present

## 2023-09-23 DIAGNOSIS — E108 Type 1 diabetes mellitus with unspecified complications: Secondary | ICD-10-CM | POA: Diagnosis not present

## 2023-09-23 DIAGNOSIS — N3000 Acute cystitis without hematuria: Secondary | ICD-10-CM | POA: Diagnosis not present

## 2023-09-23 DIAGNOSIS — Z931 Gastrostomy status: Secondary | ICD-10-CM | POA: Diagnosis not present

## 2023-09-23 DIAGNOSIS — I10 Essential (primary) hypertension: Secondary | ICD-10-CM | POA: Diagnosis not present

## 2023-09-23 DIAGNOSIS — E162 Hypoglycemia, unspecified: Secondary | ICD-10-CM | POA: Insufficient documentation

## 2023-09-23 DIAGNOSIS — I959 Hypotension, unspecified: Secondary | ICD-10-CM | POA: Diagnosis not present

## 2023-09-23 LAB — CBC WITH DIFFERENTIAL/PLATELET
Abs Immature Granulocytes: 0.17 10*3/uL — ABNORMAL HIGH (ref 0.00–0.07)
Basophils Absolute: 0 10*3/uL (ref 0.0–0.1)
Basophils Relative: 0 %
Eosinophils Absolute: 0.4 10*3/uL (ref 0.0–0.5)
Eosinophils Relative: 3 %
HCT: 31 % — ABNORMAL LOW (ref 36.0–46.0)
Hemoglobin: 9.6 g/dL — ABNORMAL LOW (ref 12.0–15.0)
Immature Granulocytes: 1 %
Lymphocytes Relative: 8 %
Lymphs Abs: 1.2 10*3/uL (ref 0.7–4.0)
MCH: 26.6 pg (ref 26.0–34.0)
MCHC: 31 g/dL (ref 30.0–36.0)
MCV: 85.9 fL (ref 80.0–100.0)
Monocytes Absolute: 0.9 10*3/uL (ref 0.1–1.0)
Monocytes Relative: 6 %
Neutro Abs: 11.8 10*3/uL — ABNORMAL HIGH (ref 1.7–7.7)
Neutrophils Relative %: 82 %
Platelets: 352 10*3/uL (ref 150–400)
RBC: 3.61 MIL/uL — ABNORMAL LOW (ref 3.87–5.11)
RDW: 16 % — ABNORMAL HIGH (ref 11.5–15.5)
WBC: 14.4 10*3/uL — ABNORMAL HIGH (ref 4.0–10.5)
nRBC: 0 % (ref 0.0–0.2)

## 2023-09-23 LAB — BASIC METABOLIC PANEL WITH GFR
Anion gap: 8 (ref 5–15)
BUN: 48 mg/dL — ABNORMAL HIGH (ref 8–23)
CO2: 26 mmol/L (ref 22–32)
Calcium: 9.9 mg/dL (ref 8.9–10.3)
Chloride: 101 mmol/L (ref 98–111)
Creatinine, Ser: 0.78 mg/dL (ref 0.44–1.00)
GFR, Estimated: >60 mL/min (ref >60)
Glucose, Bld: 112 mg/dL — ABNORMAL HIGH (ref 70–99)
Potassium: 4 mmol/L (ref 3.5–5.1)
Sodium: 135 mmol/L (ref 135–145)

## 2023-09-23 LAB — URINALYSIS, ROUTINE W REFLEX MICROSCOPIC
Bilirubin Urine: NEGATIVE
Glucose, UA: NEGATIVE mg/dL
Hgb urine dipstick: NEGATIVE
Ketones, ur: NEGATIVE mg/dL
Nitrite: NEGATIVE
Protein, ur: NEGATIVE mg/dL
Specific Gravity, Urine: 1.014 (ref 1.005–1.030)
WBC, UA: >50 WBC/hpf (ref 0–5)
pH: 7 (ref 5.0–8.0)

## 2023-09-23 LAB — CBG MONITORING, ED
Glucose-Capillary: 128 mg/dL — ABNORMAL HIGH (ref 70–99)
Glucose-Capillary: 113 mg/dL — ABNORMAL HIGH (ref 70–99)
Glucose-Capillary: 152 mg/dL — ABNORMAL HIGH (ref 70–99)
Glucose-Capillary: 155 mg/dL — ABNORMAL HIGH (ref 70–99)

## 2023-09-23 MED ORDER — CEPHALEXIN 500 MG PO CAPS: 500.0000 mg | ORAL_CAPSULE | Freq: Three times a day (TID) | ORAL | 0 refills | Status: DC

## 2023-09-23 MED ORDER — SODIUM CHLORIDE 0.9 % IV SOLN
1.0000 g | Freq: Once | INTRAVENOUS | Status: AC
  Administered 2023-09-23: 1 g via INTRAVENOUS
  Filled 2023-09-23: qty 10

## 2023-09-23 NOTE — ED Notes (Signed)
 [facility] called for transport.

## 2023-09-23 NOTE — ED Notes (Signed)
CBG 152  

## 2023-09-23 NOTE — ED Provider Notes (Signed)
 [place] EMERGENCY DEPARTMENT AT [facility] Provider Note   CSN: [id number] Arrival date & time: 09/23/23  1658     History Chief Complaint  Patient presents with   Hypoglycemia    HPI Katherine Navarro is a 68 y.o. female presenting for altered mental status. Brought in from her facility that she lives that for a chief complaint of hypoglycemia.  She has been vaguely altered per EMS report over the past few days and today did not eat like normal.  She did receive her normal insulin.  Resultingly became hypoglycemic and sent to the emergency room for further care and management. Patient denies fevers or chills, nausea vomiting, syncope shortness of breath.     Patient's recorded medical, surgical, social, medication list and allergies were reviewed in the Snapshot window as part of the initial history.   Review of Systems   Review of Systems  Constitutional:  Negative for chills and fever.  HENT:  Negative for ear pain and sore throat.   Eyes:  Negative for pain and visual disturbance.  Respiratory:  Negative for cough and shortness of breath.   Cardiovascular:  Negative for chest pain and palpitations.  Gastrointestinal:  Negative for abdominal pain and vomiting.  Genitourinary:  Negative for dysuria and hematuria.  Musculoskeletal:  Negative for arthralgias and back pain.  Skin:  Negative for color change and rash.  Neurological:  Negative for seizures and syncope.  All other systems reviewed and are negative.   Physical Exam Updated Vital Signs BP 116/69 (BP Location: Right Arm)   Pulse 78   Temp 97.7 F (36.5 C) (Oral)   Resp 18   Ht 5\' 3"  (1.6 m)   Wt 68 kg   LMP 05/03/2013   SpO2 100%   BMI 26.57 kg/m  Physical Exam Vitals and nursing note reviewed.  Constitutional:      General: She is not in acute distress.    Appearance: She is well-developed.  HENT:     Head: Normocephalic and atraumatic.  Eyes:     Conjunctiva/sclera: Conjunctivae  normal.  Cardiovascular:     Rate and Rhythm: Normal rate and regular rhythm.     Heart sounds: No murmur heard. Pulmonary:     Effort: Pulmonary effort is normal. No respiratory distress.     Breath sounds: Normal breath sounds.  Abdominal:     General: There is no distension.     Palpations: Abdomen is soft.     Tenderness: There is no abdominal tenderness. There is no right CVA tenderness or left CVA tenderness.  Musculoskeletal:        General: Signs of injury present. No swelling, tenderness or deformity. Normal range of motion.     Cervical back: Neck supple.  Skin:    General: Skin is warm and dry.  Neurological:     General: No focal deficit present.     Mental Status: She is alert and oriented to person, place, and time. Mental status is at baseline.     Cranial Nerves: No cranial nerve deficit.      ED Course/ Medical Decision Making/ A&P    Procedures Procedures   Medications Ordered in ED Medications - No data to display Medical Decision Making:   Katherine Navarro is a 68 y.o. female who presented to the ED today with altered mental status detailed above.    Patient placed on continuous vitals and telemetry monitoring while in ED which was reviewed periodically.  Complete initial  physical exam performed, notably the patient  was HDS in NAD.    Reviewed and confirmed nursing documentation for past medical history, family history, social history.    Initial Assessment:   With the patient's presentation of altered mental status, most likely diagnosis is delerium 2/2 infectious etiology (UTI/CAP/URI) vs metabolic abnormality (Na/K/Mg/Ca) vs nonspecific etiology. Other diagnoses were considered including (but not limited to) CVA, ICH, intracranial mass, critical dehydration, heptatic dysfunction, uremia, hypercarbia, intoxication, endrocrine abnormality, toxidrome. These are considered less likely due to history of present illness and physical exam findings.   This is  most consistent with an acute life/limb threatening illness complicated by underlying chronic conditions.  Initial Plan:  Serial CBG monitoring Screening labs including CBC and Metabolic panel to evaluate for infectious or metabolic etiology of disease.  Urinalysis with reflex culture ordered to evaluate for UTI or relevant urologic/nephrologic pathology.  CXR to evaluate for structural/infectious intrathoracic pathology.  EKG to evaluate for cardiac pathology Objective evaluation as below reviewed   Initial Study Results:   Laboratory  All laboratory results reviewed without evidence of clinically relevant pathology.    EKG EKG was reviewed independently. Rate, rhythm, axis, intervals all examined and without medically relevant abnormality. ST segments without concerns for elevations.    Radiology:  All images reviewed independently. Agree with radiology report at this time.   DG Chest Portable 1 View Result Date: 09/23/2023 CLINICAL DATA:  Altered mental status.  Loss of consciousness EXAM: PORTABLE CHEST 1 VIEW COMPARISON:  X-ray 08/08/2023 and older FINDINGS: Underinflation. No consolidation, pneumothorax or effusion. Normal cardiopericardial silhouette. Overlapping cardiac leads. Fixation hardware along the lower cervical spine at the edge of the imaging field. Left upper quadrant G-tube. IMPRESSION: Underinflation.  No acute cardiopulmonary disease.  G-tube Electronically Signed   By: Karen Kays M.D.   On: 09/23/2023 18:48     Final Assessment and Plan:   Patient's blood sugar stayed stable at 150 over 5 hours of observation. She did not need any further intervention for her blood sugar. Concerning her altered mental status, UTI is considered as a possible etiology given the findings on her urinalysis and her elevated white blood cell count.  Will treat her with Rocephin and placed on oral cephalosporin until follow-up with PCP. Disposition:  I have considered need for  hospitalization, however, considering all of the above, I believe this patient is stable for discharge at this time.  Patient/family educated about specific return precautions for given chief complaint and symptoms.  Patient/family educated about follow-up with PCP.     Patient/family expressed understanding of return precautions and need for follow-up. Patient spoken to regarding all imaging and laboratory results and appropriate follow up for these results. All education provided in verbal form with additional information in written form. Time was allowed for answering of patient questions. Patient discharged.    Emergency Department Medication Summary:   Medications  cefTRIAXone (ROCEPHIN) 1 g in sodium chloride 0.9 % 100 mL IVPB (0 g Intravenous Stopped 09/23/23 2131)          Clinical Impression: No diagnosis found.   Data Unavailable   Final Clinical Impression(s) / ED Diagnoses Final diagnoses:  None    Rx / DC Orders ED Discharge Orders     None         Glyn Ade, MD 09/23/23 2342

## 2023-09-23 NOTE — ED Notes (Signed)
 Report called to [facility], Report given to [facility].

## 2023-09-23 NOTE — ED Triage Notes (Signed)
 Pt arrives via [facility] from [facility]. Pt reportedly received her usually 22 units of Lantus and an additional 9 units of Novolog around 0900. At some point staff noticed pt had decreased LOC. Pt sugar was checked and she was in the 30's. Staff administered IM glucagon. On EMS arrival pt was still having CBG's in the 60's, but was able to follow commands. Given oral glucose and her CBG improved to 140's enroute. Pt is on tube feeds and per chart also received her tube feeding today. Pt A+Ox3 on arrival (missing time).

## 2023-09-24 DIAGNOSIS — E162 Hypoglycemia, unspecified: Secondary | ICD-10-CM | POA: Diagnosis not present

## 2023-09-24 DIAGNOSIS — E161 Other hypoglycemia: Secondary | ICD-10-CM | POA: Diagnosis not present

## 2023-09-24 DIAGNOSIS — R5383 Other fatigue: Secondary | ICD-10-CM | POA: Diagnosis not present

## 2023-09-24 DIAGNOSIS — Z7401 Bed confinement status: Secondary | ICD-10-CM | POA: Diagnosis not present

## 2023-09-26 LAB — URINE CULTURE

## 2023-09-27 ENCOUNTER — Telehealth (HOSPITAL_BASED_OUTPATIENT_CLINIC_OR_DEPARTMENT_OTHER): Payer: Self-pay | Admitting: *Deleted

## 2023-09-27 DIAGNOSIS — R1084 Generalized abdominal pain: Secondary | ICD-10-CM | POA: Diagnosis not present

## 2023-09-27 DIAGNOSIS — R140 Abdominal distension (gaseous): Secondary | ICD-10-CM | POA: Diagnosis not present

## 2023-09-27 DIAGNOSIS — Z79899 Other long term (current) drug therapy: Secondary | ICD-10-CM | POA: Diagnosis not present

## 2023-09-27 DIAGNOSIS — R112 Nausea with vomiting, unspecified: Secondary | ICD-10-CM | POA: Diagnosis not present

## 2023-09-27 DIAGNOSIS — R109 Unspecified abdominal pain: Secondary | ICD-10-CM | POA: Diagnosis not present

## 2023-09-27 NOTE — Progress Notes (Signed)
 ED Antimicrobial Stewardship Positive Culture Follow Up   Katherine Navarro is an 68 y.o. female who presented to [facility] on 09/23/2023 with a chief complaint of  Chief Complaint  Patient presents with   Hypoglycemia    Recent Results (from the past 720 hours)  Urine Culture     Status: Abnormal   Collection Time: 09/23/23  7:36 PM   Specimen: Urine, Catheterized  Result Value Ref Range Status   Specimen Description   Final    URINE, CATHETERIZED Performed at [facility], [address], [place], [place] [address]    Special Requests   Final    NONE Performed at [facility], [address], [place], [place] [address]    Culture (A)  Final    >=100,000 COLONIES/mL ENTEROCOCCUS FAECALIS >=100,000 COLONIES/mL PSEUDOMONAS AERUGINOSA    Report Status 09/26/2023 FINAL  Final   Organism ID, Bacteria ENTEROCOCCUS FAECALIS (A)  Final   Organism ID, Bacteria PSEUDOMONAS AERUGINOSA (A)  Final      Susceptibility   Enterococcus faecalis - MIC*    AMPICILLIN <=2 SENSITIVE Sensitive     NITROFURANTOIN <=16 SENSITIVE Sensitive     VANCOMYCIN 1 SENSITIVE Sensitive     * >=100,000 COLONIES/mL ENTEROCOCCUS FAECALIS   Pseudomonas aeruginosa - MIC*    CEFTAZIDIME 4 SENSITIVE Sensitive     CIPROFLOXACIN <=0.25 SENSITIVE Sensitive     GENTAMICIN <=1 SENSITIVE Sensitive     IMIPENEM 2 SENSITIVE Sensitive     PIP/TAZO 16 SENSITIVE Sensitive ug/mL    CEFEPIME 2 SENSITIVE Sensitive     * >=100,000 COLONIES/mL PSEUDOMONAS AERUGINOSA   69 yo F presented with hypoglycemia and AMS. No urinary symptoms. Per [facility] [employer] [facility] nursing home RN, patient mental status has returned to baseline. No further treatment warranted.    ED Provider: Dr. Alona Bene, MD   Adolphus Birchwood, PharmD-Student 09/27/2023 9:15 AM Monday - Friday phone -  [phone] Saturday - Sunday phone - [phone]

## 2023-09-27 NOTE — Telephone Encounter (Signed)
 Post ED Visit - Positive Culture Follow-up  Culture report reviewed by antimicrobial stewardship pharmacist: [facility] []  [address], Pharm.D. []  Celedonio Miyamoto, Pharm.D., BCPS AQ-ID []  Garvin Fila, Pharm.D., BCPS []  Georgina Pillion, Pharm.D., BCPS []  [place], [address].D., BCPS, AAHIVP []  Estella Husk, Pharm.D., BCPS, AAHIVP []  Lysle Pearl, PharmD, BCPS []  Phillips Climes, PharmD, BCPS []  Agapito Games, PharmD, BCPS []  Verlan Friends, PharmD []  Mervyn Gay, PharmD, BCPS []  Vinnie Level, PharmD  [facility] [x]  Sharin Mons  PharmD []  Greer Pickerel, PharmD []  Adalberto Cole, PharmD []  Perlie Gold, Rph []  Lonell Face) Jean Rosenthal, PharmD []  Earl Many, PharmD []  Junita Push, PharmD []  Dorna Leitz, PharmD []  Terrilee Files, PharmD []  Lynann Beaver, PharmD []  Keturah Barre, PharmD []  Loralee Pacas, PharmD []  Bernadene Person, PharmD   Positive urine culture Treated with cephalexin, per [place]  RN patient returned to baseline and is asymptomatic.  Reviewed by Dr Alona Bene, and no further patient follow-up is required at this time.  Nena Polio Garner Nash 09/27/2023, 11:59 AM

## 2023-09-28 ENCOUNTER — Ambulatory Visit (HOSPITAL_COMMUNITY)
Admission: RE | Admit: 2023-09-28 | Discharge: 2023-09-28 | Disposition: A | Discharge status: Home / Self Care | Payer: Medicare HMO | Source: Ambulatory Visit | Attending: Vascular Surgery | Admitting: Vascular Surgery

## 2023-09-28 ENCOUNTER — Ambulatory Visit (INDEPENDENT_AMBULATORY_CARE_PROVIDER_SITE_OTHER): Payer: Medicare HMO | Admitting: Vascular Surgery

## 2023-09-28 ENCOUNTER — Ambulatory Visit (INDEPENDENT_AMBULATORY_CARE_PROVIDER_SITE_OTHER)
Admit: 2023-09-28 | Discharge: 2023-09-28 | Disposition: A | Discharge status: Home / Self Care | Payer: Medicare HMO | Attending: Vascular Surgery

## 2023-09-28 VITALS — BP 107/72 | HR 90 | Ht 63.0 in

## 2023-09-28 DIAGNOSIS — I70263 Atherosclerosis of native arteries of extremities with gangrene, bilateral legs: Secondary | ICD-10-CM | POA: Diagnosis not present

## 2023-09-28 DIAGNOSIS — I739 Peripheral vascular disease, unspecified: Secondary | ICD-10-CM | POA: Insufficient documentation

## 2023-09-28 LAB — VAS US ABI WITH/WO TBI
Left ABI: 0.86
Right ABI: 1.11

## 2023-09-28 NOTE — Progress Notes (Signed)
 Patient ID: Katherine Navarro, female   DOB: December 16, 1955, 68 y.o.   MRN: [id number]  Reason for Consult: Routine Post Op   Referred by No ref. provider found  Subjective:     HPI:  Katherine Navarro is a 68 y.o. female status post endovascular treatment of right lower extremity atherosclerosis of native arteries with heel ulceration.  We had previously discussed that ulceration on the left heel was too deep for treatment and that the only intervention there would be above-knee amputation.  She was evaluated by Dr. Lajoyce Corners in the hospital and was offered left transtibial amputation but declined.  She is back at her nursing home.  Has not had any fevers or chills.  Past Medical History:  Diagnosis Date   Complication of anesthesia    trouble waking up   CONSTIPATION, CHRONIC 12/13/2007   DIABETES MELLITUS, TYPE I 01/07/2007   GLAUCOMA 12/13/2007   HYPERCHOLESTEROLEMIA 12/13/2007   Hypertension    Hyperthyroidism    INTERNAL HEMORRHOIDS 07/23/2008   LEUKOPENIA, CHRONIC 12/13/2007   Nonproliferative diabetic retinopathy NOS(362.03) 12/13/2007   Unspecified hypothyroidism 12/13/2007   VARICOSE VEINS, LOWER EXTREMITIES 12/13/2007   Family History  Problem Relation Age of Onset   Cancer Mother        Breast Cancer   Diabetes Mother    Breast cancer Mother    Diabetes Father    Past Surgical History:  Procedure Laterality Date   ABDOMINAL AORTOGRAM W/LOWER EXTREMITY N/A 08/10/2023   Procedure: ABDOMINAL AORTOGRAM W/ BILATERAL LOWER EXTREMITY RUNOFF;  Surgeon: Victorino Sparrow, MD;  Location: [facility] INVASIVE CV LAB;  Service: Cardiovascular;  Laterality: N/A;   ANTERIOR CERVICAL DECOMP/DISCECTOMY FUSION N/A 02/06/2020   Procedure: Anterior Cervical Decompression Discectomy Fusion Cervical five-six;  Surgeon: Donalee Citrin, MD;  Location: [facility] OR;  Service: Neurosurgery;  Laterality: N/A;   ELECTROCARDIOGRAM  08/24/2006   EYE SURGERY     bilateral   IR GASTROSTOMY TUBE MOD SED  05/18/2023   LEEP  N/A 12/01/2012   Procedure: LOOP ELECTROSURGICAL EXCISION PROCEDURE (LEEP);  Surgeon: Purcell Nails, MD;  Location: [facility] ORS;  Service: Gynecology;  Laterality: N/A;   PERIPHERAL INTRAVASCULAR LITHOTRIPSY  08/10/2023   Procedure: PERIPHERAL INTRAVASCULAR LITHOTRIPSY;  Surgeon: Victorino Sparrow, MD;  Location: [facility] INVASIVE CV LAB;  Service: Cardiovascular;;  Rt SFA   PERIPHERAL VASCULAR BALLOON ANGIOPLASTY  08/10/2023   Procedure: PERIPHERAL VASCULAR BALLOON ANGIOPLASTY;  Surgeon: Victorino Sparrow, MD;  Location: [facility] INVASIVE CV LAB;  Service: Cardiovascular;;   PERIPHERAL VASCULAR INTERVENTION  08/10/2023   Procedure: PERIPHERAL VASCULAR INTERVENTION;  Surgeon: Victorino Sparrow, MD;  Location: [facility] INVASIVE CV LAB;  Service: Cardiovascular;;   Stress Myoview   05/18/2004   TUBAL LIGATION      Short Social History:  Social History   Tobacco Use   Smoking status: Never   Smokeless tobacco: Never  Substance Use Topics   Alcohol use: Not Currently    Alcohol/week: 1.0 standard drink of alcohol    Types: 1 Glasses of wine per week    Comment: occasional    Allergies  Allergen Reactions   Atorvastatin     REACTION: perceived myalgias   Ciprofloxacin Nausea And Vomiting   Epinephrine     REACTION: thyroid problems   Erythromycin     Can not recall   Morphine Nausea Only    Headache   Peanut-Containing Drug Products Hives   Penicillins Hives    Current Outpatient Medications  Medication Sig Dispense Refill  aspirin 81 MG chewable tablet Place 81 mg into feeding tube daily.     cephALEXin (KEFLEX) 500 MG capsule Take 1 capsule (500 mg total) by mouth 3 (three) times daily. 15 capsule 0   clopidogrel (PLAVIX) 75 MG tablet Take 1 tablet (75 mg total) by mouth daily with breakfast.     famotidine (PEPCID) 20 MG tablet Take 1 tablet (20 mg total) by mouth daily. (Patient taking differently: Place 20 mg into feeding tube daily.)     folic acid (FOLVITE) 1 MG tablet Take 2 tablets (2 mg  total) by mouth daily. (Patient taking differently: Place 1 mg into feeding tube daily.)     insulin aspart (NOVOLOG) 100 UNIT/ML injection 0-15 Units, Subcutaneous, Every 4 hours, CBG < 70: Implement Hypoglycemia measures CBG 70 - 120: 0 units CBG 121 - 150: 2 units CBG 151 - 200: 3 units CBG 201 - 250: 5 units CBG 251 - 300: 8 units CBG 301 - 350: 11 units CBG 351 - 400: 15 units CBG > 400: call MD and obtain STAT lab verification (Patient taking differently: Inject 0-17 Units into the skin every 4 (four) hours. 0-15 Units, Subcutaneous, Every 4 hours, CBG < 70: Implement Hypoglycemia measures CBG 70 - 120: 0 units CBG 121 - 150: 2 units CBG 151 - 200: 3 units CBG 201 - 250: 5 units CBG 251 - 300: 8 units CBG 301 - 350: 11 units CBG 351 - 400: 15 units CBG > 400: call MD and obtain STAT lab verification)     Insulin Glargine (BASAGLAR KWIKPEN) 100 UNIT/ML Inject 32 Units into the skin daily.     lactose free nutrition (BOOST PLUS) LIQD Take 237 mLs by mouth 3 (three) times daily with meals.     levothyroxine (SYNTHROID) 75 MCG tablet Take 1 tablet (75 mcg total) by mouth daily at 6 (six) AM. (Patient taking differently: Take 150 mcg by mouth daily at 6 (six) AM.)     metFORMIN (GLUCOPHAGE) 500 MG tablet Take 500 mg by mouth 2 (two) times daily with a meal.     multivitamin (PROSIGHT) TABS tablet Take 1 tablet by mouth daily. (Patient taking differently: Take 1 tablet by mouth daily. Via g-tube, not oral)     Nutritional Supplements (FEEDING SUPPLEMENT, GLUCERNA 1.5 CAL,) LIQD 65 mL/hr, Continuous, Starting at 1800 daily for 12 Hours     polyethylene glycol (MIRALAX / GLYCOLAX) 17 g packet Take 17 g by mouth 2 (two) times daily.     rosuvastatin (CRESTOR) 10 MG tablet Take 1 tablet (10 mg total) by mouth daily at 6 PM.     tamsulosin (FLOMAX) 0.4 MG CAPS capsule Take 1 capsule (0.4 mg total) by mouth daily. (Patient taking differently: Take 0.4 mg by mouth daily. Via g-tube)     thiamine (VITAMIN B-1)  100 MG tablet Take 1 tablet (100 mg total) by mouth daily.     Water For Irrigation, Sterile (FREE WATER) SOLN Place 400 mLs into feeding tube every 4 (four) hours.     zinc sulfate, 50mg  elemental zinc, 220 (50 Zn) MG capsule Place 220 mg into feeding tube daily.     albuterol (PROVENTIL) (2.5 MG/3ML) 0.083% nebulizer solution Take 3 mLs (2.5 mg total) by nebulization every 4 (four) hours as needed for up to 5 days for wheezing or shortness of breath.     No current facility-administered medications for this visit.    Review of Systems  Constitutional:  Constitutional negative. HENT: HENT  negative.  Eyes: Eyes negative.  Respiratory: Respiratory negative.  Cardiovascular: Cardiovascular negative.  Musculoskeletal: Positive for leg pain.  Skin: Positive for wound.  Neurological: Neurological negative. Hematologic: Hematologic/lymphatic negative.  Psychiatric: Psychiatric negative.        Objective:  Objective   Vitals:   09/28/23 1222  BP: 107/72  Pulse: 90  Height: 5\' 3"  (1.6 m)   Body mass index is 26.57 kg/m.  Physical Exam HENT:     Head: Normocephalic.     Nose: Nose normal.  Eyes:     Pupils: Pupils are equal, round, and reactive to light.  Cardiovascular:     Pulses:          Dorsalis pedis pulses are detected w/ Doppler on the right side and detected w/ Doppler on the left side.  Abdominal:     General: Abdomen is flat.     Palpations: Abdomen is soft.  Skin:    Comments: Bilateral heal wounds, left more extensive than right  Neurological:     Mental Status: She is alert.     Data: ABI Findings:  +---------+------------------+-----+--------+  Right   Rt Pressure (mmHg)IndexWaveform  +---------+------------------+-----+--------+  Brachial 94                               +---------+------------------+-----+--------+  PERO                           absent    +---------+------------------+-----+--------+  DP      114                1.11 biphasic  +---------+------------------+-----+--------+  Great Toe86                0.83 Normal    +---------+------------------+-----+--------+   +---------+------------------+-----+--------+  Left    Lt Pressure (mmHg)IndexWaveform  +---------+------------------+-----+--------+  Brachial 103                              +---------+------------------+-----+--------+  PTA                            absent    +---------+------------------+-----+--------+  DP      89                0.86 biphasic  +---------+------------------+-----+--------+  Great Toe                       Bandage   +---------+------------------+-----+--------+   +-------+-----------+-----------+------------+------------+  ABI/TBIToday's ABIToday's TBIPrevious ABIPrevious TBI  +-------+-----------+-----------+------------+------------+  Right 1.11       0.83       1.00        NA            +-------+-----------+-----------+------------+------------+  Left  0.86       NA         0.75        NA            +-------+-----------+-----------+------------+------------+      Summary:  Right: Resting right ankle-brachial index is within normal range. The  right toe-brachial index is normal.   Left: Resting left ankle-brachial index indicates mild left lower  extremity arterial disease. The left toe-brachial index is not obatined  due toe condition/bandage.   RIGHT      PSV  cm/sRatioStenosis       Waveform  +-----------+--------+-----+---------------+--------+  CFA Prox   113                         biphasic  +-----------+--------+-----+---------------+--------+  DFA       57                          biphasic  +-----------+--------+-----+---------------+--------+  SFA Prox   66                          biphasic  +-----------+--------+-----+---------------+--------+  SFA Mid    109                         biphasic   +-----------+--------+-----+---------------+--------+  SFA Distal 154          30-49% stenosisbiphasic  +-----------+--------+-----+---------------+--------+  POP Prox   46                          biphasic  +-----------+--------+-----+---------------+--------+  POP Distal 161          30-49% stenosisbiphasic  +-----------+--------+-----+---------------+--------+  ATA Distal 34                          biphasic  +-----------+--------+-----+---------------+--------+  PTA Distal 0            occluded       absent    +-----------+--------+-----+---------------+--------+  PERO Distal0            occluded       absent    +-----------+--------+-----+---------------+--------+     Right Stent(s):  +---------------+--------+--------+--------+  SFA Stent      PSV cm/sStenosisWaveform  +---------------+--------+--------+--------+  Prox to Stent  62              biphasic  +---------------+--------+--------+--------+  Proximal Stent 68              biphasic  +---------------+--------+--------+--------+  Mid Stent      109             biphasic  +---------------+--------+--------+--------+  Distal Stent   55              biphasic  +---------------+--------+--------+--------+  Distal to [id number]             biphasic  +---------------+--------+--------+--------+     Summary:  Right:  - Patent femoral-popliteal arteries and superficial femoal artery stent.  - Elevated velocities at the distal superficial femoral artery (post  stent) suggestive of 30-49% stenosis.  - Elevated velocities at the distal popliteal artery suggestive of 30-49%  stenosis.  - No flow observed at the distal posterior tibial or peroneal arteries.  - Patent anterior tibial artery with biphasic waveforms at distal segment.      Assessment/Plan:    68 year old female status post end of revascularization of the right lower extremity with strong dorsalis  pedis signal today.  Ulceration of the right heel is stable and there is stable extensive ulceration of the left heel.  I recommended continued daily wound care.  She remains high risk for amputation particularly of the left lower extremity and possibly the right.  Family is unsure if they have followed with Dr. Lajoyce Corners and I have encouraged them to ensure that she does.  She will see [place] in 6  months with repeat ABIs.     Maeola Harman MD [facility]

## 2023-09-29 DIAGNOSIS — D72829 Elevated white blood cell count, unspecified: Secondary | ICD-10-CM | POA: Diagnosis not present

## 2023-09-29 DIAGNOSIS — R140 Abdominal distension (gaseous): Secondary | ICD-10-CM | POA: Diagnosis not present

## 2023-09-29 DIAGNOSIS — R7989 Other specified abnormal findings of blood chemistry: Secondary | ICD-10-CM | POA: Diagnosis not present

## 2023-09-30 ENCOUNTER — Other Ambulatory Visit: Payer: Self-pay | Admitting: *Deleted

## 2023-09-30 DIAGNOSIS — I70263 Atherosclerosis of native arteries of extremities with gangrene, bilateral legs: Secondary | ICD-10-CM

## 2023-09-30 DIAGNOSIS — N390 Urinary tract infection, site not specified: Secondary | ICD-10-CM | POA: Diagnosis not present

## 2023-09-30 DIAGNOSIS — I739 Peripheral vascular disease, unspecified: Secondary | ICD-10-CM

## 2023-09-30 DIAGNOSIS — Z79899 Other long term (current) drug therapy: Secondary | ICD-10-CM | POA: Diagnosis not present

## 2023-10-01 ENCOUNTER — Emergency Department (HOSPITAL_COMMUNITY)
Admission: EM | Admit: 2023-10-01 | Discharge: 2023-10-20 | Disposition: E | Attending: Emergency Medicine | Admitting: Emergency Medicine

## 2023-10-01 ENCOUNTER — Encounter (HOSPITAL_COMMUNITY): Payer: Self-pay | Admitting: Emergency Medicine

## 2023-10-01 ENCOUNTER — Other Ambulatory Visit: Payer: Self-pay

## 2023-10-01 DIAGNOSIS — Z7901 Long term (current) use of anticoagulants: Secondary | ICD-10-CM | POA: Insufficient documentation

## 2023-10-01 DIAGNOSIS — Z91010 Allergy to peanuts: Secondary | ICD-10-CM | POA: Diagnosis not present

## 2023-10-01 DIAGNOSIS — I10 Essential (primary) hypertension: Secondary | ICD-10-CM | POA: Insufficient documentation

## 2023-10-01 DIAGNOSIS — Z7982 Long term (current) use of aspirin: Secondary | ICD-10-CM | POA: Diagnosis not present

## 2023-10-01 DIAGNOSIS — E109 Type 1 diabetes mellitus without complications: Secondary | ICD-10-CM | POA: Diagnosis not present

## 2023-10-01 DIAGNOSIS — R1111 Vomiting without nausea: Secondary | ICD-10-CM | POA: Diagnosis not present

## 2023-10-01 DIAGNOSIS — Z7984 Long term (current) use of oral hypoglycemic drugs: Secondary | ICD-10-CM | POA: Insufficient documentation

## 2023-10-01 DIAGNOSIS — R55 Syncope and collapse: Secondary | ICD-10-CM | POA: Diagnosis not present

## 2023-10-01 DIAGNOSIS — I469 Cardiac arrest, cause unspecified: Secondary | ICD-10-CM | POA: Diagnosis not present

## 2023-10-01 DIAGNOSIS — N390 Urinary tract infection, site not specified: Secondary | ICD-10-CM | POA: Diagnosis not present

## 2023-10-01 DIAGNOSIS — E039 Hypothyroidism, unspecified: Secondary | ICD-10-CM | POA: Diagnosis not present

## 2023-10-01 DIAGNOSIS — R739 Hyperglycemia, unspecified: Secondary | ICD-10-CM | POA: Diagnosis not present

## 2023-10-01 DIAGNOSIS — Z794 Long term (current) use of insulin: Secondary | ICD-10-CM | POA: Insufficient documentation

## 2023-10-01 MED ORDER — CALCIUM CHLORIDE 10 % IV SOLN
INTRAVENOUS | Status: AC | PRN
  Administered 2023-10-01: 1 g via INTRAVENOUS

## 2023-10-01 MED ORDER — SODIUM BICARBONATE 8.4 % IV SOLN
INTRAVENOUS | Status: AC | PRN
  Administered 2023-10-01: 50 meq via INTRAVENOUS

## 2023-10-01 MED ORDER — EPINEPHRINE 1 MG/10ML IJ SOSY
PREFILLED_SYRINGE | INTRAMUSCULAR | Status: AC | PRN
  Administered 2023-10-01 (×2): 1 mg via INTRAVENOUS

## 2023-10-20 NOTE — ED Triage Notes (Signed)
 Patient BIB EMS from [facility] as CPR in progress.  Patient is normally alert and oriented x 4.  Staff at facility states that patient began to vomit and became altered around 6:45 PM.  On EMS arrival, patient noted in altered, in Sinus Mirian Ames, then went into PEA.  CBG 343.  Epi x 3 given PTA.

## 2023-10-20 NOTE — Code Documentation (Signed)
 Patient time of death occurred at 77. Called by Dr. Val Garin.  No pulses present.  No cardiac activity noted on bedside ultrasound

## 2023-10-20 NOTE — ED Notes (Signed)
Family is at bedside at this time.

## 2023-10-20 NOTE — ED Provider Notes (Addendum)
 [facility] Provider Note   CSN: [id number] Arrival date & time: 09/23/2023  1946     History  Chief Complaint  Patient presents with   Cardiac Arrest   HPI   Katherine Navarro is a 68 y.o. female with PMHx severe hypothyroidism, myxedema, wernickes encephalopathy, and failure to thrive requiring PEG tube placement, urinary retention with chronic foley;diabetes type 1, hypertension, hypothyroidism, PAD, cervical myelopathy, anemia presents as an active cardiac arrest and CPR.  EMS received a call for concerns for vomiting and altered mental status.  She was bradycardic in the truck then lost pulses, requiring them to start CPR at 7:07PM.  She had received 4 rounds of epinephrine prior to arrival and was in PEA throughout the entire code.   HPI     Home Medications Prior to Admission medications   Medication Sig Start Date End Date Taking? Authorizing Provider  albuterol (PROVENTIL) (2.5 MG/3ML) 0.083% nebulizer solution Take 3 mLs (2.5 mg total) by nebulization every 4 (four) hours as needed for up to 5 days for wheezing or shortness of breath. 08/12/23 08/17/23  Pokhrel, Amador Bad, MD  aspirin 81 MG chewable tablet Place 81 mg into feeding tube daily.    [provider]  cephALEXin (KEFLEX) 500 MG capsule Take 1 capsule (500 mg total) by mouth 3 (three) times daily. 09/23/23   Onetha Bile, MD  clopidogrel (PLAVIX) 75 MG tablet Take 1 tablet (75 mg total) by mouth daily with breakfast. 08/12/23   Pokhrel, Laxman, MD  famotidine (PEPCID) 20 MG tablet Take 1 tablet (20 mg total) by mouth daily. Patient taking differently: Place 20 mg into feeding tube daily. 05/20/23   Ghimire, Estil Heman, MD  folic acid (FOLVITE) 1 MG tablet Take 2 tablets (2 mg total) by mouth daily. Patient taking differently: Place 1 mg into feeding tube daily. 05/20/23   Ghimire, Estil Heman, MD  insulin aspart (NOVOLOG) 100 UNIT/ML injection 0-15 Units, Subcutaneous,  Every 4 hours, CBG < 70: Implement Hypoglycemia measures CBG 70 - 120: 0 units CBG 121 - 150: 2 units CBG 151 - 200: 3 units CBG 201 - 250: 5 units CBG 251 - 300: 8 units CBG 301 - 350: 11 units CBG 351 - 400: 15 units CBG > 400: call MD and obtain STAT lab verification Patient taking differently: Inject 0-17 Units into the skin every 4 (four) hours. 0-15 Units, Subcutaneous, Every 4 hours, CBG < 70: Implement Hypoglycemia measures CBG 70 - 120: 0 units CBG 121 - 150: 2 units CBG 151 - 200: 3 units CBG 201 - 250: 5 units CBG 251 - 300: 8 units CBG 301 - 350: 11 units CBG 351 - 400: 15 units CBG > 400: call MD and obtain STAT lab verification 05/20/23   Burton Casey, MD  Insulin Glargine (BASAGLAR KWIKPEN) 100 UNIT/ML Inject 32 Units into the skin daily. 07/25/23   [provider]  lactose free nutrition (BOOST PLUS) LIQD Take 237 mLs by mouth 3 (three) times daily with meals. 05/20/23   Ghimire, Estil Heman, MD  levothyroxine (SYNTHROID) 75 MCG tablet Take 1 tablet (75 mcg total) by mouth daily at 6 (six) AM. Patient taking differently: Take 150 mcg by mouth daily at 6 (six) AM. 05/21/23   Ghimire, Estil Heman, MD  metFORMIN (GLUCOPHAGE) 500 MG tablet Take 500 mg by mouth 2 (two) times daily with a meal.    [provider]  multivitamin (PROSIGHT) TABS tablet Take 1  tablet by mouth daily. Patient taking differently: Take 1 tablet by mouth daily. Via g-tube, not oral 05/20/23   Ghimire, Estil Heman, MD  Nutritional Supplements (FEEDING SUPPLEMENT, GLUCERNA 1.5 CAL,) LIQD 65 mL/hr, Continuous, Starting at 1800 daily for 12 Hours 05/20/23   Ghimire, Estil Heman, MD  polyethylene glycol (MIRALAX / GLYCOLAX) 17 g packet Take 17 g by mouth 2 (two) times daily. 05/20/23   Ghimire, Estil Heman, MD  rosuvastatin (CRESTOR) 10 MG tablet Take 1 tablet (10 mg total) by mouth daily at 6 PM. 08/12/23   Pokhrel, Laxman, MD  tamsulosin (FLOMAX) 0.4 MG CAPS capsule Take 1 capsule (0.4 mg total) by mouth  daily. Patient taking differently: Take 0.4 mg by mouth daily. Via g-tube 05/20/23   Ghimire, Estil Heman, MD  thiamine (VITAMIN B-1) 100 MG tablet Take 1 tablet (100 mg total) by mouth daily. 05/20/23   Ghimire, Estil Heman, MD  Water For Irrigation, Sterile (FREE WATER) SOLN Place 400 mLs into feeding tube every 4 (four) hours. 05/20/23   Ghimire, Estil Heman, MD  zinc sulfate, 50mg  elemental zinc, 220 (50 Zn) MG capsule Place 220 mg into feeding tube daily.    [provider]      Allergies    Atorvastatin, Ciprofloxacin, Epinephrine, Erythromycin, Morphine, Peanut-containing drug products, and Penicillins    Review of Systems   Review of Systems  Physical Exam Updated Vital Signs LMP 05/03/2013  Physical Exam Constitutional:      Comments: Unresponsive  HENT:     Head: Normocephalic and atraumatic.     Right Ear: External ear normal.     Left Ear: External ear normal.     Nose: Nose normal.     Mouth/Throat:     Pharynx: Oropharynx is clear.  Eyes:     Conjunctiva/sclera: Conjunctivae normal.  Cardiovascular:     Comments: Pulseless Pulmonary:     Comments: Bag-ventilated breath sounds Abdominal:     Palpations: Abdomen is soft.     Comments: Bruising noted to the lower quadrants, G-tube noted  Genitourinary:    Comments: Foley in place Musculoskeletal:     Comments: Bilateral lower extremity edema  Skin:    Comments: Cool peripheral extremities     ED Results / Procedures / Treatments   Labs (all labs ordered are listed, but only abnormal results are displayed) Labs Reviewed - No data to display  EKG None  Radiology No results found.  Procedures Procedures    Medications Ordered in ED Medications  EPINEPHrine (ADRENALIN) 1 MG/10ML injection (1 mg Intravenous Given 10/15/2023 1939)  calcium chloride injection (1 g Intravenous Given 10/04/2023 1936)  sodium bicarbonate injection (50 mEq Intravenous Given 09/25/2023 1941)    ED Course/ Medical Decision  Making/ A&P                                 Medical Decision Making Risk Prescription drug management.   On arrival at around 7:37, CPR is in active process.  I can auscultate bilateral lung sounds with bagged ventilations through LMA.  She was moved to the trauma bay, with attempts to obtain peripheral IV access though she has an IO in place.  I performed bedside POCUS which demonstrated no significant pericardial effusion or significant cardiac activity.  Compressions were continued, and patient was given 2 rounds of epinephrine, calcium, and an amp of bicarbonate.  On third pulse check in the ED, patient continued to  have no peripheral pulses.  On bedside POCUS, faint, very intermittent twitching of heart was noted with no significant cardiac output organized activity.  Time of death was called at 7:44 PM.  Patient is on a medical examiner's case.  I spoke with patient's husband, [name] Zimmers, over the phone to inform him of this unfortunate news and gave him my condolences.  He will present to the ED.        Final Clinical Impression(s) / ED Diagnoses Final diagnoses:  Cardiac arrest [facility])    Rx / DC Orders ED Discharge Orders     None         Lorain Robson, MD 10/15/2023 2115    Lorain Robson, MD 09/26/2023 2156    Mozell Arias, MD 09/27/2023 2350

## 2023-10-20 DEATH — deceased

## 2024-04-04 ENCOUNTER — Encounter (HOSPITAL_COMMUNITY)

## 2024-04-04 ENCOUNTER — Ambulatory Visit
